# Patient Record
Sex: Male | Born: 1975 | Race: Black or African American | Hispanic: No | Marital: Single | Smoking: Never smoker
Health system: Southern US, Community
[De-identification: ages and names within clinical notes are randomized; demographics above are authoritative.]

## PROBLEM LIST (undated history)

## (undated) DIAGNOSIS — E66813 Obesity, class 3: Secondary | ICD-10-CM

## (undated) DIAGNOSIS — Z91148 Patient's other noncompliance with medication regimen for other reason: Secondary | ICD-10-CM

## (undated) DIAGNOSIS — N179 Acute kidney failure, unspecified: Secondary | ICD-10-CM

## (undated) DIAGNOSIS — J45909 Unspecified asthma, uncomplicated: Secondary | ICD-10-CM

## (undated) DIAGNOSIS — K439 Ventral hernia without obstruction or gangrene: Secondary | ICD-10-CM

## (undated) DIAGNOSIS — M109 Gout, unspecified: Secondary | ICD-10-CM

## (undated) DIAGNOSIS — I10 Essential (primary) hypertension: Secondary | ICD-10-CM

## (undated) DIAGNOSIS — I219 Acute myocardial infarction, unspecified: Secondary | ICD-10-CM

## (undated) DIAGNOSIS — I428 Other cardiomyopathies: Secondary | ICD-10-CM

## (undated) DIAGNOSIS — I5042 Chronic combined systolic (congestive) and diastolic (congestive) heart failure: Secondary | ICD-10-CM

## (undated) DIAGNOSIS — E876 Hypokalemia: Secondary | ICD-10-CM

## (undated) DIAGNOSIS — I119 Hypertensive heart disease without heart failure: Secondary | ICD-10-CM

## (undated) DIAGNOSIS — Z9114 Patient's other noncompliance with medication regimen: Secondary | ICD-10-CM

## (undated) DIAGNOSIS — G4730 Sleep apnea, unspecified: Secondary | ICD-10-CM

## (undated) HISTORY — DX: Essential (primary) hypertension: I10

## (undated) HISTORY — DX: Unspecified asthma, uncomplicated: J45.909

---

## 2002-03-18 ENCOUNTER — Emergency Department (HOSPITAL_COMMUNITY): Admission: EM | Admit: 2002-03-18 | Discharge: 2002-03-18 | Payer: Self-pay | Admitting: Emergency Medicine

## 2003-06-10 ENCOUNTER — Emergency Department (HOSPITAL_COMMUNITY): Admission: EM | Admit: 2003-06-10 | Discharge: 2003-06-10 | Payer: Self-pay | Admitting: Emergency Medicine

## 2006-09-02 ENCOUNTER — Ambulatory Visit (HOSPITAL_BASED_OUTPATIENT_CLINIC_OR_DEPARTMENT_OTHER): Admission: RE | Admit: 2006-09-02 | Discharge: 2006-09-02 | Payer: Self-pay | Admitting: Internal Medicine

## 2006-09-17 ENCOUNTER — Ambulatory Visit: Payer: Self-pay | Admitting: Internal Medicine

## 2006-11-04 ENCOUNTER — Ambulatory Visit (HOSPITAL_BASED_OUTPATIENT_CLINIC_OR_DEPARTMENT_OTHER): Admission: RE | Admit: 2006-11-04 | Discharge: 2006-11-04 | Payer: Self-pay | Admitting: Internal Medicine

## 2006-11-12 ENCOUNTER — Ambulatory Visit: Payer: Self-pay | Admitting: Internal Medicine

## 2009-11-26 ENCOUNTER — Ambulatory Visit: Payer: Self-pay | Admitting: Internal Medicine

## 2009-11-26 ENCOUNTER — Encounter: Payer: Self-pay | Admitting: Internal Medicine

## 2009-11-26 DIAGNOSIS — I130 Hypertensive heart and chronic kidney disease with heart failure and stage 1 through stage 4 chronic kidney disease, or unspecified chronic kidney disease: Secondary | ICD-10-CM | POA: Insufficient documentation

## 2009-11-26 DIAGNOSIS — K0510 Chronic gingivitis, plaque induced: Secondary | ICD-10-CM | POA: Insufficient documentation

## 2009-11-26 DIAGNOSIS — I119 Hypertensive heart disease without heart failure: Secondary | ICD-10-CM

## 2009-11-26 DIAGNOSIS — B079 Viral wart, unspecified: Secondary | ICD-10-CM | POA: Insufficient documentation

## 2009-11-26 LAB — CONVERTED CEMR LAB
BUN: 17 mg/dL (ref 6–23)
Basophils Absolute: 0.1 10*3/uL (ref 0.0–0.1)
Bilirubin, Direct: 0.2 mg/dL (ref 0.0–0.3)
CO2: 31 meq/L (ref 19–32)
Calcium: 9.5 mg/dL (ref 8.4–10.5)
Chlamydia, Swab/Urine, PCR: NEGATIVE
Chloride: 101 meq/L (ref 96–112)
Cholesterol: 134 mg/dL (ref 0–200)
Creatinine, Ser: 1.4 mg/dL (ref 0.4–1.5)
Eosinophils Absolute: 0.1 10*3/uL (ref 0.0–0.7)
HDL: 36.2 mg/dL — ABNORMAL LOW (ref >39.00)
LDL Cholesterol: 85 mg/dL (ref 0–99)
Lymphocytes Relative: 26.4 % (ref 12.0–46.0)
MCHC: 33.3 g/dL (ref 30.0–36.0)
MCV: 85.6 fL (ref 78.0–100.0)
Monocytes Absolute: 0.5 10*3/uL (ref 0.1–1.0)
Neutrophils Relative %: 61.3 % (ref 43.0–77.0)
Nitrite: NEGATIVE
Platelets: 322 10*3/uL (ref 150.0–400.0)
Specific Gravity, Urine: 1.015 (ref 1.000–1.030)
Total Bilirubin: 0.8 mg/dL (ref 0.3–1.2)
Total Protein: 7.6 g/dL (ref 6.0–8.3)
Triglycerides: 62 mg/dL (ref 0.0–149.0)
pH: 7 (ref 5.0–8.0)

## 2009-11-27 ENCOUNTER — Encounter: Payer: Self-pay | Admitting: Internal Medicine

## 2009-12-30 ENCOUNTER — Emergency Department (HOSPITAL_COMMUNITY)
Admission: EM | Admit: 2009-12-30 | Discharge: 2009-12-30 | Payer: Self-pay | Source: Home / Self Care | Admitting: Emergency Medicine

## 2010-02-10 NOTE — Letter (Signed)
Summary: Results Follow-up Letter  [facility]  [address] [place], [place] [address]   Phone: [phone]  Fax: [phone]    11/27/2009  [address] [place], [place]  [address]  Dear Mr. STRATMANN,   The following are the results of your recent test(s):  Test     Result     Kidney     normal STD tests     negative CBC       normal Urine       trace of blood Liver       one, very slight enzyme elevation  _________________________________________________________  Please call for an appointment soon _________________________________________________________ _________________________________________________________ _________________________________________________________  Sincerely,  Sanda Linger MD [facility]

## 2010-02-10 NOTE — Assessment & Plan Note (Signed)
Summary: NEW [employer] PT--PKG---#---STC   Vital Signs:  Patient profile:   35 year old male Height:      66 inches Weight:      268 pounds BMI:     43.41 O2 Sat:      98 % on Room air Temp:     98.3 degrees F oral Pulse rate:   82 / minute Pulse rhythm:   regular Resp:     16 per minute BP sitting:   158 / 100  (left arm) Cuff size:   large  Vitals Entered By: Rock Nephew CMA (November 26, 2009 10:12 AM)  Nutrition Counseling: Patient's BMI is greater than 25 and therefore counseled on weight management options.  O2 Flow:  Room air CC: Pt here for CPX w/ labs, Hypertension Management Is Patient Diabetic? No Pain Assessment Patient in pain? no       Does patient need assistance? Functional Status Self care Ambulation Normal   Primary Care Provider:  Etta Grandchild MD  CC:  Pt here for CPX w/ labs and Hypertension Management.  History of Present Illness: New to me for a complete physical. He wants a complete STD screen. He has a hx. of htn. but stopped taking meds a few years ago.  Hypertension History:      He denies headache, chest pain, palpitations, dyspnea with exertion, orthopnea, PND, peripheral edema, visual symptoms, neurologic problems, syncope, and side effects from treatment.  He notes no problems with any antihypertensive medication side effects.        Positive major cardiovascular risk factors include hypertension.  Negative major cardiovascular risk factors include male age less than 59 years old, no history of diabetes or hyperlipidemia, negative family history for ischemic heart disease, and non-tobacco-user status.        Further assessment for target organ damage reveals no history of ASHD, cardiac end-organ damage (CHF/LVH), stroke/TIA, peripheral vascular disease, renal insufficiency, or hypertensive retinopathy.     Preventive Screening-Counseling & Management  Alcohol-Tobacco     Alcohol drinks/day: 0     Alcohol Counseling: not indicated;  patient does not drink     Smoking Status: never     Tobacco Counseling: not indicated; no tobacco use  Caffeine-Diet-Exercise     Does Patient Exercise: no  Hep-HIV-STD-Contraception     Hepatitis Risk: no risk noted     HIV Risk: no risk noted     STD Risk: risk noted     STD Risk Counseling: to avoid increased STD risk     Dental Visit-last 6 months no     Dental Care Counseling: to seek dental care; no dental care within six months     TSE monthly: yes     Testicular SE Education/Counseling to perform regular STE     Sun Exposure-Excessive: no     Sun Exposure Counseling: not indicated; sun exposure is acceptable  Safety-Violence-Falls     Seat Belt Use: yes     Helmet Use: n/a     Firearms in the Home: no firearms in the home     Smoke Detectors: yes     Violence in the Home: no risk noted     Sexual Abuse: no      Sexual History:  currently monogamous.        Drug Use:  no.        Blood Transfusions:  no.    Medications Prior to Update: 1)  None  Current Medications (verified): 1)  Valturna 300-320 Mg Tabs (Aliskiren-Valsartan) .... One By Mouth Once Daily For High Blood Pressure  Allergies (verified): No Known Drug Allergies  Past History:  Past Medical History: Hypertension  Past Surgical History: Denies surgical history  Family History: Family History Hypertension Mother has HIV  Social History: Occupation: dock Financial controller Married Never Smoked Alcohol use-no Drug use-no Regular exercise-no Smoking Status:  never Hepatitis Risk:  no risk noted HIV Risk:  no risk noted STD Risk:  risk noted Dental Care w/in 6 mos.:  no Sun Exposure-Excessive:  no Risk analyst Use:  yes Sexual History:  currently monogamous Blood Transfusions:  no Drug Use:  no Does Patient Exercise:  no  Review of Systems       The patient complains of weight gain and suspicious skin lesions.  The patient denies anorexia, fever, weight loss, chest pain, syncope, dyspnea on  exertion, peripheral edema, prolonged cough, headaches, hemoptysis, abdominal pain, melena, hematochezia, severe indigestion/heartburn, hematuria, incontinence, genital sores, muscle weakness, depression, abnormal bleeding, enlarged lymph nodes, angioedema, and testicular masses.   CV:  Denies chest pain or discomfort, difficulty breathing at night, difficulty breathing while lying down, fainting, fatigue, leg cramps with exertion, lightheadness, near fainting, palpitations, shortness of breath with exertion, and swelling of feet. Endo:  Denies cold intolerance, excessive hunger, excessive thirst, excessive urination, heat intolerance, polyuria, and weight change.  Physical Exam  General:  alert, well-developed, well-nourished, well-hydrated, appropriate dress, normal appearance, healthy-appearing, cooperative to examination, and overweight-appearing.   Head:  normocephalic, atraumatic, no abnormalities observed, and no abnormalities palpated.   Eyes:  vision grossly intact, pupils equal, pupils round, pupils reactive to light, and no injection.   Nose:  External nasal examination shows no deformity or inflammation. Nasal mucosa are pink and moist without lesions or exudates. Mouth:  pharynx pink and moist, no erythema, no exudates, no posterior lymphoid hypertrophy, no postnasal drip, no pharyngeal crowing, poor dentition, excessive plaque, gingival inflammation, and gingival recession.   Neck:  supple, full ROM, no masses, no thyromegaly, no thyroid nodules or tenderness, no JVD, no HJR, normal carotid upstroke, and no carotid bruits.   Chest Wall:  No deformities, masses, tenderness or gynecomastia noted. Breasts:  No masses or gynecomastia noted Lungs:  Normal respiratory effort, chest expands symmetrically. Lungs are clear to auscultation, no crackles or wheezes. Heart:  Normal rate and regular rhythm. S1 and S2 normal without gallop, murmur, click, rub or other extra sounds. Abdomen:  soft,  non-tender, normal bowel sounds, no distention, no masses, no guarding, no rigidity, no rebound tenderness, no abdominal hernia, no inguinal hernia, no hepatomegaly, and no splenomegaly.   Rectal:  No external abnormalities noted. Normal sphincter tone. No rectal masses or tenderness. Genitalia:  uncircumcised, no hydrocele, no varicocele, no scrotal masses, no testicular masses or atrophy, no cutaneous lesions, and no urethral discharge.   Prostate:  Prostate gland firm and smooth, no enlargement, nodularity, tenderness, mass, asymmetry or induration. Msk:  normal ROM, no joint tenderness, no joint swelling, no joint warmth, no redness over joints, no joint deformities, no joint instability, no crepitation, and no muscle atrophy.   Pulses:  R and L carotid,radial,femoral,dorsalis pedis and posterior tibial pulses are full and equal bilaterally Extremities:  No clubbing, cyanosis, edema, or deformity noted with normal full range of motion of all joints.   Neurologic:  No cranial nerve deficits noted. Station and gait are normal. Plantar reflexes are down-going bilaterally. DTRs are symmetrical throughout. Sensory, motor and coordinative functions appear intact. Skin:  very large verrucous lesion on left lower posterior leg. Cervical Nodes:  no anterior cervical adenopathy and no posterior cervical adenopathy.   Axillary Nodes:  no R axillary adenopathy and no L axillary adenopathy.   Inguinal Nodes:  no R inguinal adenopathy and no L inguinal adenopathy.   Psych:  Cognition and judgment appear intact. Alert and cooperative with normal attention span and concentration. No apparent delusions, illusions, hallucinations   Impression & Recommendations:  Problem # 1:  CHRONIC GINGIVITIS PLAQUE INDUCED (ICD-523.10) Assessment New  Orders: Dental Referral (Dentist)  Problem # 2:  WART, VIRAL (ICD-078.10) Assessment: New  Orders: Dermatology Referral (Derma)  Problem # 3:  HYPERTENSION  (ICD-401.9) Assessment: Deteriorated  His updated medication list for this problem includes:    Valturna 300-320 Mg Tabs (Aliskiren-valsartan) ..... One by mouth once daily for high blood pressure  Orders: Venipuncture (62130) TLB-Lipid Panel (80061-LIPID) TLB-BMP (Basic Metabolic Panel-BMET) (80048-METABOL) TLB-CBC Platelet - w/Differential (85025-CBCD) TLB-Hepatic/Liver Function Pnl (80076-HEPATIC) TLB-TSH (Thyroid Stimulating Hormone) (84443-TSH) TLB-Udip w/ Micro (81001-URINE) T-Chlamydia  Probe, urine (86578-46962) T-GC Probe, urine (95284-13244) T-HIV Antibody  (Reflex) [phone]) T-RPR (Syphilis) (44034-74259) EKG w/ Interpretation (93000)  BP today: 158/100  Problem # 4:  ROUTINE GENERAL MEDICAL EXAM@HEALTH  CARE FACL (ICD-V70.0) Assessment: New  Orders: Venipuncture (56387) TLB-Lipid Panel (80061-LIPID) TLB-BMP (Basic Metabolic Panel-BMET) (80048-METABOL) TLB-CBC Platelet - w/Differential (85025-CBCD) TLB-Hepatic/Liver Function Pnl (80076-HEPATIC) TLB-TSH (Thyroid Stimulating Hormone) (84443-TSH) TLB-Udip w/ Micro (81001-URINE) T-Chlamydia  Probe, urine (56433-29518) T-GC Probe, urine (84166-06301) T-HIV Antibody  (Reflex) [id number]) T-RPR (Syphilis) (73220-25427)  Td Booster: Tdap (11/26/2009)   Flu Vax: Fluvax 3+ (11/26/2009)    Discussed using sunscreen, use of alcohol, drug use, self testicular exam, routine dental care, routine eye care, routine physical exam, seat belts, multiple vitamins,  and recommendations for immunizations.  Discussed exercise and checking cholesterol.  Discussed gun safety, safe sex, and contraception.   Problem # 5:  OBESITY, UNSPECIFIED (ICD-278.00) Assessment: New  Orders: Venipuncture (06237) TLB-Lipid Panel (80061-LIPID) TLB-BMP (Basic Metabolic Panel-BMET) (80048-METABOL) TLB-CBC Platelet - w/Differential (85025-CBCD) TLB-Hepatic/Liver Function Pnl (80076-HEPATIC) TLB-TSH (Thyroid Stimulating Hormone)  (84443-TSH) TLB-Udip w/ Micro (81001-URINE) T-Chlamydia  Probe, urine (62831-51761) T-GC Probe, urine (60737-10626) T-HIV Antibody  (Reflex) [id number]) T-RPR (Syphilis) (50093-81829)  Ht: 66 (11/26/2009)   Wt: 268 (11/26/2009)   BMI: 43.41 (11/26/2009)  Complete Medication List: 1)  Valturna 300-320 Mg Tabs (Aliskiren-valsartan) .... One by mouth once daily for high blood pressure  Other Orders: Admin 1st Vaccine (93716) Flu Vaccine 40yrs + (96789) Tdap => 39yrs IM (38101) Admin of Any Addtl Vaccine (75102)  Hypertension Assessment/Plan:      The patient's hypertensive risk group is category A: No risk factors and no target organ damage.  Today's blood pressure is 158/100.  His blood pressure goal is < 140/90.  Patient Instructions: 1)  Please schedule a follow-up appointment in 2 months. 2)  It is important that you exercise regularly at least 20 minutes 5 times a week. If you develop chest pain, have severe difficulty breathing, or feel very tired , stop exercising immediately and seek medical attention. 3)  You need to lose weight. Consider a lower calorie diet and regular exercise.  4)  If you could be exposed to sexually transmitted diseases, you should use a condom. 5)  Check your Blood Pressure regularly. If it is above 140/90: you should make an appointment. Prescriptions: VALTURNA 300-320 MG TABS (ALISKIREN-VALSARTAN) One by mouth once daily for high blood pressure  #56 x 0   Entered and Authorized by:  Etta Grandchild MD   Signed by:   Etta Grandchild MD on 11/26/2009   Method used:   Samples Given   RxID:   [id number]    Orders Added: 1)  Venipuncture [36415] 2)  TLB-Lipid Panel [80061-LIPID] 3)  TLB-BMP (Basic Metabolic Panel-BMET) [80048-METABOL] 4)  TLB-CBC Platelet - w/Differential [85025-CBCD] 5)  TLB-Hepatic/Liver Function Pnl [80076-HEPATIC] 6)  TLB-TSH (Thyroid Stimulating Hormone) [84443-TSH] 7)  TLB-Udip w/ Micro [81001-URINE] 8)  T-Chlamydia   Probe, urine [phone] 9)  T-GC Probe, urine [phone] 10)  T-HIV Antibody  (Reflex) [[id number]] 11)  T-RPR (Syphilis) [44010-27253] 12)  Dermatology Referral [Derma] 13)  Dental Referral [Dentist] 14)  Admin 1st Vaccine [90471] 15)  Flu Vaccine 39yrs + [90658] 16)  Tdap => 27yrs IM [90715] 17)  Admin of Any Addtl Vaccine [90472] 18)  EKG w/ Interpretation [93000] 19)  New Patient Level IV [99204] 20)  New Patient 18-39 years [99385]   Immunizations Administered:  Tetanus Vaccine:    Vaccine Type: Tdap    Site: right deltoid    Mfr: GlaxoSmithKline    Dose: 0.5 ml    Route: IM    Given by: Rock Nephew CMA    Exp. Date: 10/31/2011    Lot #: [id number]    VIS given: 11/29/07 version given November 26, 2009.   Immunizations Administered:  Tetanus Vaccine:    Vaccine Type: Tdap    Site: right deltoid    Mfr: GlaxoSmithKline    Dose: 0.5 ml    Route: IM    Given by: Rock Nephew CMA    Exp. Date: 10/31/2011    Lot #: [id number]    VIS given: 11/29/07 version given November 26, 2009.  Prevention & Chronic Care Immunizations   Influenza vaccine: Fluvax 3+  (11/26/2009)   Influenza vaccine deferral: Refused  (11/26/2009)    Tetanus booster: 11/26/2009: Tdap    Pneumococcal vaccine: Not documented  Other Screening   Smoking status: never  (11/26/2009)  Hypertension   Last Blood Pressure: 158 / 100  (11/26/2009)   Serum creatinine: Not documented   Serum potassium Not documented  Self-Management Support :    Hypertension self-management support: Not documented   Nursing Instructions: Give tetanus booster today  Flu Vaccine Consent Questions     Do you have a history of severe allergic reactions to this vaccine? no    Any prior history of allergic reactions to egg and/or gelatin? no    Do you have a sensitivity to the preservative Thimersol? no    Do you have a past history of Guillan-Barre Syndrome? no    Do you currently have an acute  febrile illness? no    Have you ever had a severe reaction to latex? no    Vaccine information given and explained to patient? yes    Are you currently pregnant? no    Lot Number:[id number]   Exp Date:07/11/2010   Site Given  Left Deltoid IM Smoking status: Not documented  Hypertension   Last Blood Pressure: 158 / 100  (11/26/2009)   Serum creatinine: Not documented   Serum potassium Not documented  Self-Management Support :    Hypertension self-management support: Not documented   Nursing Instructions: Give tetanus booster today   .lbflu

## 2010-02-10 NOTE — Letter (Signed)
Summary: Lipid Letter  [facility]  [address] [place], [place] [address]   Phone: [phone]  Fax: [phone]    11/27/2009  Piper Albro [address] [place], [place]  [address]  Dear Cristal Deer:  We have carefully reviewed your last lipid profile from 11/26/2009 and the results are noted below with a summary of recommendations for lipid management.    Cholesterol:       134     Goal: <200   HDL "good" Cholesterol:   57.84     Goal: >40   LDL "bad" Cholesterol:   85     Goal: <130   Triglycerides:       62.0     Goal: <150        TLC Diet (Therapeutic Lifestyle Change): Saturated Fats & Transfatty acids should be kept < 7% of total calories ***Reduce Saturated Fats Polyunstaurated Fat can be up to 10% of total calories Monounsaturated Fat Fat can be up to 20% of total calories Total Fat should be no greater than 25-35% of total calories Carbohydrates should be 50-60% of total calories Protein should be approximately 15% of total calories Fiber should be at least 20-30 grams a day ***Increased fiber may help lower LDL Total Cholesterol should be < 200mg /day Consider adding plant stanol/sterols to diet (example: Benacol spread) ***A higher intake of unsaturated fat may reduce Triglycerides and Increase HDL    Adjunctive Measures (may lower LIPIDS and reduce risk of Heart Attack) include: Aerobic Exercise (20-30 minutes 3-4 times a week) Limit Alcohol Consumption Weight Reduction Aspirin 75-81 mg a day by mouth (if not allergic or contraindicated) Dietary Fiber 20-30 grams a day by mouth     Current Medications: 1)    Valturna 300-320 Mg Tabs (Aliskiren-valsartan) .... One by mouth once daily for high blood pressure  If you have any questions, please call. We appreciate being able to work with you.   Sincerely,    [facility] Etta Grandchild MD

## 2010-05-26 NOTE — Procedures (Signed)
NAME:  Noah Rodriguez Rodriguez, Noah Rodriguez        ACCOUNT NO.:  [id number]   MEDICAL RECORD NO.:  [id number]          PATIENT TYPE:  OUT   LOCATION:  SLEEP CENTER                 FACILITY:  [facility]   PHYSICIAN:  Clinton D. Maple Hudson, MD, FCCP, FACPDATE OF BIRTH:  02-27-75   DATE OF STUDY:  09/02/2006                            NOCTURNAL POLYSOMNOGRAM   REFERRING PHYSICIAN:   I had originally dictated this on September 11, 2006, but it got  interrupted and that first attempt will be discarded.  This is a re-  dictation.   INDICATION FOR STUDY:  Hypersomnia with sleep apnea.   EPWORTH SLEEPINESS SCORE:  13/24, BMI 43.8, weight 280 pounds.   HOME MEDICATIONS:  Benicar/HCT.   A diagnostic NPSG protocol was requested.   SLEEP ARCHITECTURE:  Total sleep time 228 minutes with sleep efficiency  63%.  Stage 1 was 9%, stage 2 71%, stage 3 10%, REM 10% of total sleep  time.  Sleep latency 10 minutes, REM latency 78 minutes, awake after  sleep onset 86 minutes, arousal index markedly increased at 59.5  indicating increased EEG arousal.  No bedtime medication was reported.   RESPIRATORY DATA:  Diagnostic NPSG protocol requested.  Apnea/hypopnea  index (AHI, RDI) 79.2 obstructive events per hour indicating severe  obstructive sleep apnea/hypopnea syndrome.  All events were hypopneas  totaling 301.  Events were significantly more common while supine (AHI  93.3), but also significant while lying on left side (AHI 40).  REM AHI  85.3.   OXYGEN DATA:  Very loud snoring with oxygen desaturation nadir 82%.  Mean oxygen saturation through the study was 93% on room air.   CARDIAC DATA:  Normal sinus rhythm.   MOVEMENT/PARASOMNIA:  No significant leg jerk disturbance.  Bathroom x2.   IMPRESSION/RECOMMENDATION:  1. Severe obstructive sleep apnea/hypopnea syndrome, apnea/hypopnea      index (AHI) 79.2 per hour.  All events were hypopneas.  Events were      more common while supine, but not limited to supine position.   Very      loud snoring with oxygen desaturation nadir 82%.  2. Consider return for continuous positive airway pressure (CPAP)      titration or evaluate for alternative therapy as appropriate.      Clinton D. Maple Hudson, MD, [facility], FACP  Diplomate, Biomedical engineer of Sleep Medicine  Electronically Signed     CDY/MEDQ  D:  09/17/2006 16:47:32  T:  09/17/2006 17:56:26  Job:  [id number]

## 2010-05-26 NOTE — Procedures (Signed)
NAME:  Noah Rodriguez, Noah Rodriguez        ACCOUNT NO.:  [id number]   MEDICAL RECORD NO.:  [id number]          PATIENT TYPE:  OUT   LOCATION:  SLEEP CENTER                 FACILITY:  [facility]   PHYSICIAN:  Clinton D. Maple Hudson, MD, FCCP, FACPDATE OF BIRTH:  04/10/1975   DATE OF STUDY:  09/02/2006                            NOCTURNAL POLYSOMNOGRAM   REFERRING PHYSICIAN:  Fleet Contras, M.D.   INDICATION FOR STUDY:  Hypersomnia with sleep apnea.   EPWORTH SLEEPINESS SCORE:  13/24, BMI 43.8, weight 280 pounds.   MEDICATIONS:  Benicar HCT.   A diagnostic NPSG protocol was requested.   SLEEP ARCHITECTURE:  Total sleep time 228 minutes with sleep efficiency  63%.  Stage I was 9%, stage II 71%, stage III 10%, REM 10% of total  sleep time.  Sleep latency 10 minutes, REM latency 78 minutes, awake  after sleep onset 86 minutes. Arousal index increased at 59.5 indicating  increased EEG arousal. No bedtime medication was reported.   RESPIRATORY DATA:  Apnea/hypopnea index (AHI, RDI) 79.2 obstructive  events per hour indicating severe obstructive sleep apnea/hypopnea  syndrome. There were a total of 301 events, all hypopnea's.  Events were  somewhat more common while supine but also significant while lying on  left side. REM AHI 85.3.   OXYGEN DATA:  Very loud snoring with oxygen desaturation nadir 82%.  Mean  (dictation ended here)   CARDIAC DATA:  __________   MOVEMENT-PARASOMNIA:  __________   IMPRESSIONS-RECOMMENDATIONS:  __________      Rennis Chris. Maple Hudson, MD, [facility], FACP  Diplomate, American Board of Sleep Medicine     CDY/MEDQ  D:  09/11/2006 11:43:42  T:  09/11/2006 16:40:00  Job:  [id number]

## 2010-05-26 NOTE — Procedures (Signed)
NAME:  THORN, DEMAS        ACCOUNT NO.:  [id number]   MEDICAL RECORD NO.:  [id number]          PATIENT TYPE:  OUT   LOCATION:  SLEEP CENTER                 FACILITY:  [facility]   PHYSICIAN:  Clinton D. Maple Hudson, MD, FCCP, FACPDATE OF BIRTH:  [birth year] 1975   DATE OF STUDY:  11/04/2006                            NOCTURNAL POLYSOMNOGRAM   REFERRING PHYSICIAN:   INDICATION FOR STUDY:  Hypersomnia with sleep apnea.   EPWORTH SLEEPINESS SCORE:  14/24, height 5 feet 7 inches, neck size 17  inches, weight 280 pounds.   HOME MEDICATIONS:  Listed as Benicar/HCT.   A baseline diagnostic NPSG on September 02, 2006, recorded an AHI of 79.2  per hour.  CPAP titration is requested.   SLEEP ARCHITECTURE:  Total sleep time 361 minutes with sleep efficiency  92.8%.  Stage 1 was 5%, stage 2 85%, stage 3 3%, REM 5.8% of total sleep  time.  Awake after sleep onset 23 minutes.   RESPIRATORY DATA:  CPAP titration protocol.  CPAP was titrated to 14  CWP, AHI 0 per hour.  A medium Mirage Quattro mask was used with heated  humidifier.   OXYGEN DATA:  Snoring was prevented by CPAP with mean oxygen saturation  on CPAP at 94% on room air.   CARDIAC DATA:  Normal sinus rhythm.   MOVEMENT/PARASOMNIA:  No significant movement disturbance.   IMPRESSION/RECOMMENDATION:  1. Successful continuous positive airway pressure (CPAP) titration to      14 centimeters of water, apnea/hypopnea index 0 per hour.  A medium      Mirage Quattro mask was used with heated humidifier.  2. Baseline diagnostic polysomnogram on September 02, 2006, recorded an      apnea/hypopnea index of 79.2 per hour.      Clinton D. Maple Hudson, MD, [facility], FACP  Diplomate, Biomedical engineer of Sleep Medicine  Electronically Signed     CDY/MEDQ  D:  11/12/2006 15:25:14  T:  11/13/2006 20:02:45  Job:  [id number]

## 2011-12-31 ENCOUNTER — Ambulatory Visit (INDEPENDENT_AMBULATORY_CARE_PROVIDER_SITE_OTHER): Payer: 59 | Admitting: Internal Medicine

## 2011-12-31 ENCOUNTER — Encounter: Payer: Self-pay | Admitting: Internal Medicine

## 2011-12-31 ENCOUNTER — Ambulatory Visit (INDEPENDENT_AMBULATORY_CARE_PROVIDER_SITE_OTHER)
Admission: RE | Admit: 2011-12-31 | Discharge: 2011-12-31 | Disposition: A | Discharge status: Home / Self Care | Payer: 59 | Source: Ambulatory Visit | Attending: Internal Medicine | Admitting: Internal Medicine

## 2011-12-31 ENCOUNTER — Other Ambulatory Visit (INDEPENDENT_AMBULATORY_CARE_PROVIDER_SITE_OTHER): Payer: 59

## 2011-12-31 VITALS — BP 148/100 | HR 91 | Temp 97.2°F | Resp 16 | Ht 66.0 in | Wt 279.0 lb

## 2011-12-31 DIAGNOSIS — R05 Cough: Secondary | ICD-10-CM

## 2011-12-31 DIAGNOSIS — I517 Cardiomegaly: Secondary | ICD-10-CM

## 2011-12-31 DIAGNOSIS — I10 Essential (primary) hypertension: Secondary | ICD-10-CM

## 2011-12-31 DIAGNOSIS — R059 Cough, unspecified: Secondary | ICD-10-CM

## 2011-12-31 DIAGNOSIS — E669 Obesity, unspecified: Secondary | ICD-10-CM

## 2011-12-31 DIAGNOSIS — J4530 Mild persistent asthma, uncomplicated: Secondary | ICD-10-CM | POA: Insufficient documentation

## 2011-12-31 DIAGNOSIS — J45901 Unspecified asthma with (acute) exacerbation: Secondary | ICD-10-CM

## 2011-12-31 LAB — CBC WITH DIFFERENTIAL/PLATELET
Eosinophils Relative: 3.2 % (ref 0.0–5.0)
HCT: 43.7 % (ref 39.0–52.0)
Hemoglobin: 14.6 g/dL (ref 13.0–17.0)
Lymphs Abs: 1.4 10*3/uL (ref 0.7–4.0)
Monocytes Relative: 13.4 % — ABNORMAL HIGH (ref 3.0–12.0)
Neutro Abs: 2.3 10*3/uL (ref 1.4–7.7)
RBC: 5.23 Mil/uL (ref 4.22–5.81)
WBC: 4.4 10*3/uL — ABNORMAL LOW (ref 4.5–10.5)

## 2011-12-31 LAB — COMPREHENSIVE METABOLIC PANEL
CO2: 29 mEq/L (ref 19–32)
Creatinine, Ser: 1.4 mg/dL (ref 0.4–1.5)
GFR: 74 mL/min (ref >60.00)
Glucose, Bld: 99 mg/dL (ref 70–99)
Total Bilirubin: 0.7 mg/dL (ref 0.3–1.2)

## 2011-12-31 LAB — URINALYSIS, ROUTINE W REFLEX MICROSCOPIC
Bilirubin Urine: NEGATIVE
Nitrite: NEGATIVE

## 2011-12-31 LAB — HEMOGLOBIN A1C: Hgb A1c MFr Bld: 6.3 % (ref 4.6–6.5)

## 2011-12-31 LAB — LIPID PANEL
Cholesterol: 120 mg/dL (ref 0–200)
HDL: 30.3 mg/dL — ABNORMAL LOW (ref >39.00)
VLDL: 14.2 mg/dL (ref 0.0–40.0)

## 2011-12-31 MED ORDER — OLMESARTAN MEDOXOMIL-HCTZ 40-25 MG PO TABS: 1.0000 | ORAL_TABLET | Freq: Every day | ORAL | Status: DC

## 2011-12-31 MED ORDER — METHYLPREDNISOLONE ACETATE 80 MG/ML IJ SUSP
120.0000 mg | Freq: Once | INTRAMUSCULAR | Status: AC
  Administered 2011-12-31: 120 mg via INTRAMUSCULAR

## 2011-12-31 MED ORDER — MOMETASONE FURO-FORMOTEROL FUM 100-5 MCG/ACT IN AERO: 2.0000 | INHALATION_SPRAY | Freq: Two times a day (BID) | RESPIRATORY_TRACT | Status: DC

## 2011-12-31 NOTE — Patient Instructions (Signed)
Asthma, Adult Asthma is caused by narrowing of the air passages in the lungs. It may be triggered by pollen, dust, animal dander, molds, some foods, respiratory infections, exposure to smoke, exercise, emotional stress or other allergens (things that cause allergic reactions or allergies). Repeat attacks are common. HOME CARE INSTRUCTIONS   Use prescription medications as ordered by your caregiver.  Avoid pollen, dust, animal dander, molds, smoke and other things that cause attacks at home and at work.  You may have fewer attacks if you decrease dust in your home. Electrostatic air cleaners may help.  It may help to replace your pillows or mattress with materials less likely to cause allergies.  Talk to your caregiver about an action plan for managing asthma attacks at home, including, the use of a peak flow meter which measures the severity of your asthma attack. An action plan can help minimize or stop the attack without having to seek medical care.  If you are not on a fluid restriction, drink 8 to 10 glasses of water each day.  Always have a plan prepared for seeking medical attention, including, calling your physician, accessing local emergency care, and calling 911 (in the [place] for a severe attack.  Discuss possible exercise routines with your caregiver.  If animal dander is the cause of asthma, you may need to get rid of pets. SEEK MEDICAL CARE IF:   You have wheezing and shortness of breath even if taking medicine to prevent attacks.  You have muscle aches, chest pain or thickening of sputum.  Your sputum changes from clear or white to yellow, green, gray, or bloody.  You have any problems that may be related to the medicine you are taking (such as a rash, itching, swelling or trouble breathing). SEEK IMMEDIATE MEDICAL CARE IF:   Your usual medicines do not stop your wheezing or there is increased coughing and/or shortness of breath.  You have increased difficulty  breathing.  You have a fever. MAKE SURE YOU:   Understand these instructions.  Will watch your condition.  Will get help right away if you are not doing well or get worse. Document Released: 12/28/2004 Document Revised: 03/22/2011 Document Reviewed: 08/16/2007 [facility] Patient Information 2013 [place], [place]. Hypertension As your heart beats, it forces blood through your arteries. This force is your blood pressure. If the pressure is too high, it is called hypertension (HTN) or high blood pressure. HTN is dangerous because you may have it and not know it. High blood pressure may mean that your heart has to work harder to pump blood. Your arteries may be narrow or stiff. The extra work puts you at risk for heart disease, stroke, and other problems.  Blood pressure consists of two numbers, a higher number over a lower, 110/72, for example. It is stated as "110 over 72." The ideal is below 120 for the top number (systolic) and under 80 for the bottom (diastolic). Write down your blood pressure today. You should pay close attention to your blood pressure if you have certain conditions such as:  Heart failure.  Prior heart attack.  Diabetes  Chronic kidney disease.  Prior stroke.  Multiple risk factors for heart disease. To see if you have HTN, your blood pressure should be measured while you are seated with your arm held at the level of the heart. It should be measured at least twice. A one-time elevated blood pressure reading (especially in the Emergency Department) does not mean that you need treatment. There may be  conditions in which the blood pressure is different between your right and left arms. It is important to see your caregiver soon for a recheck. Most people have essential hypertension which means that there is not a specific cause. This type of high blood pressure may be lowered by changing lifestyle factors such as:  Stress.  Smoking.  Lack of exercise.  Excessive  weight.  Drug/tobacco/alcohol use.  Eating less salt. Most people do not have symptoms from high blood pressure until it has caused damage to the body. Effective treatment can often prevent, delay or reduce that damage. TREATMENT  When a cause has been identified, treatment for high blood pressure is directed at the cause. There are a large number of medications to treat HTN. These fall into several categories, and your caregiver will help you select the medicines that are best for you. Medications may have side effects. You should review side effects with your caregiver. If your blood pressure stays high after you have made lifestyle changes or started on medicines,   Your medication(s) may need to be changed.  Other problems may need to be addressed.  Be certain you understand your prescriptions, and know how and when to take your medicine.  Be sure to follow up with your caregiver within the time frame advised (usually within two weeks) to have your blood pressure rechecked and to review your medications.  If you are taking more than one medicine to lower your blood pressure, make sure you know how and at what times they should be taken. Taking two medicines at the same time can result in blood pressure that is too low. SEEK IMMEDIATE MEDICAL CARE IF:  You develop a severe headache, blurred or changing vision, or confusion.  You have unusual weakness or numbness, or a faint feeling.  You have severe chest or abdominal pain, vomiting, or breathing problems. MAKE SURE YOU:   Understand these instructions.  Will watch your condition.  Will get help right away if you are not doing well or get worse. Document Released: 12/28/2004 Document Revised: 03/22/2011 Document Reviewed: 08/18/2007 [facility] Patient Information 2013 [place], [place].

## 2011-12-31 NOTE — Progress Notes (Signed)
Subjective:    Patient ID: Noah Rodriguez, male    DOB: [birth year], 36 y.o.   MRN: [id number]  Cough This is a new problem. Episode onset: 2 months ago. The problem has been unchanged. The problem occurs every few hours. The cough is non-productive. Associated symptoms include headaches, shortness of breath and wheezing. Pertinent negatives include no chest pain, chills, ear congestion, ear pain, fever, heartburn, hemoptysis, myalgias, nasal congestion, postnasal drip, rash, rhinorrhea, sore throat, sweats or weight loss. The symptoms are aggravated by cold air. Risk factors for lung disease include occupational exposure. He has tried nothing for the symptoms. His past medical history is significant for asthma.  Hypertension This is a chronic problem. The current episode started more than 1 year ago. The problem has been gradually worsening since onset. The problem is uncontrolled. Associated symptoms include headaches and shortness of breath. Pertinent negatives include no anxiety, blurred vision, chest pain, malaise/fatigue, neck pain, orthopnea, palpitations, peripheral edema, PND or sweats. There are no associated agents to hypertension. Past treatments include nothing. Compliance problems include psychosocial issues.       Review of Systems  Constitutional: Negative for fever, chills, weight loss, malaise/fatigue, diaphoresis, activity change, appetite change, fatigue and unexpected weight change.  HENT: Negative for ear pain, sore throat, rhinorrhea, neck pain and postnasal drip.   Eyes: Negative.  Negative for blurred vision.  Respiratory: Positive for cough, shortness of breath and wheezing. Negative for apnea, hemoptysis, choking, chest tightness and stridor.   Cardiovascular: Negative for chest pain, palpitations, orthopnea and PND.  Gastrointestinal: Negative.  Negative for heartburn, abdominal pain and blood in stool.  Genitourinary: Negative.   Musculoskeletal: Negative for  myalgias, back pain, joint swelling, arthralgias and gait problem.  Skin: Negative for color change, pallor, rash and wound.  Neurological: Positive for headaches. Negative for dizziness, tremors, seizures, syncope, facial asymmetry, speech difficulty, weakness, light-headedness and numbness.  Hematological: Negative for adenopathy. Does not bruise/bleed easily.  Psychiatric/Behavioral: Negative.        Objective:   Physical Exam  Vitals reviewed. Constitutional: He is oriented to person, place, and time. He appears well-developed and well-nourished.  Non-toxic appearance. He does not have a sickly appearance. He does not appear ill. No distress.  HENT:  Head: Normocephalic and atraumatic.  Mouth/Throat: Oropharynx is clear and moist. No oropharyngeal exudate.  Eyes: Conjunctivae normal are normal. Right eye exhibits no discharge. Left eye exhibits no discharge. No scleral icterus.  Neck: Normal range of motion. Neck supple. No JVD present. No tracheal deviation present. No thyromegaly present.  Cardiovascular: Normal rate, regular rhythm, S1 normal, S2 normal, normal heart sounds and intact distal pulses.  Exam reveals no gallop and no friction rub.   No murmur heard. Pulses:      Carotid pulses are 1+ on the right side, and 1+ on the left side.      Radial pulses are 1+ on the right side, and 1+ on the left side.       Femoral pulses are 1+ on the right side, and 1+ on the left side.      Popliteal pulses are 1+ on the right side, and 1+ on the left side.       Dorsalis pedis pulses are 1+ on the right side, and 1+ on the left side.       Posterior tibial pulses are 1+ on the right side, and 1+ on the left side.  Pulmonary/Chest: Effort normal and breath sounds normal. No accessory muscle usage  or stridor. Not tachypneic. No respiratory distress. He has no decreased breath sounds. He has no wheezes. He has no rhonchi. He has no rales. He exhibits no tenderness.  Abdominal: Soft. Bowel  sounds are normal. He exhibits no distension and no mass. There is no tenderness. There is no rebound and no guarding.  Musculoskeletal: Normal range of motion. He exhibits no edema and no tenderness.  Lymphadenopathy:    He has no cervical adenopathy.  Neurological: He is oriented to person, place, and time.  Skin: Skin is warm and dry. No rash noted. He is not diaphoretic. No erythema. No pallor.  Psychiatric: He has a normal mood and affect. His behavior is normal. Judgment and thought content normal.      Lab Results  Component Value Date   WBC 5.4 11/26/2009   HGB 14.7 11/26/2009   HCT 44.2 11/26/2009   PLT 322.0 11/26/2009   GLUCOSE 89 11/26/2009   CHOL 134 11/26/2009   TRIG 62.0 11/26/2009   HDL 36.20* 11/26/2009   LDLCALC 85 11/26/2009   ALT 47 11/26/2009   AST 39* 11/26/2009   NA 139 11/26/2009   K 4.8 11/26/2009   CL 101 11/26/2009   CREATININE 1.4 11/26/2009   BUN 17 11/26/2009   CO2 31 11/26/2009   TSH 1.76 11/26/2009      Assessment & Plan:

## 2012-01-02 ENCOUNTER — Encounter: Payer: Self-pay | Admitting: Internal Medicine

## 2012-01-02 DIAGNOSIS — I517 Cardiomegaly: Secondary | ICD-10-CM | POA: Insufficient documentation

## 2012-01-02 NOTE — Assessment & Plan Note (Signed)
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

## 2012-01-02 NOTE — Assessment & Plan Note (Signed)
His Chest xray is clear with the exception of cardiomegaly

## 2012-01-02 NOTE — Assessment & Plan Note (Signed)
Will start treatment with benicar-hct I will check his labs today to look for secondary cause and end organ damage

## 2012-01-02 NOTE — Assessment & Plan Note (Signed)
I will check his labs today to look for secondary causes and end organ damage 

## 2012-01-02 NOTE — Assessment & Plan Note (Signed)
This was present on his CXR today I have asked him to return for further evaluation

## 2012-01-14 ENCOUNTER — Ambulatory Visit: Payer: Self-pay | Admitting: Internal Medicine

## 2012-05-24 ENCOUNTER — Ambulatory Visit: Payer: 59 | Admitting: Internal Medicine

## 2012-05-24 DIAGNOSIS — Z0289 Encounter for other administrative examinations: Secondary | ICD-10-CM

## 2012-05-29 ENCOUNTER — Ambulatory Visit (INDEPENDENT_AMBULATORY_CARE_PROVIDER_SITE_OTHER): Payer: 59 | Admitting: Internal Medicine

## 2012-05-29 ENCOUNTER — Encounter: Payer: Self-pay | Admitting: Internal Medicine

## 2012-05-29 ENCOUNTER — Other Ambulatory Visit (INDEPENDENT_AMBULATORY_CARE_PROVIDER_SITE_OTHER): Payer: 59

## 2012-05-29 VITALS — BP 136/84 | HR 84 | Temp 98.0°F | Resp 16 | Ht 66.0 in | Wt 293.0 lb

## 2012-05-29 DIAGNOSIS — I517 Cardiomegaly: Secondary | ICD-10-CM

## 2012-05-29 DIAGNOSIS — R0609 Other forms of dyspnea: Secondary | ICD-10-CM

## 2012-05-29 DIAGNOSIS — J4530 Mild persistent asthma, uncomplicated: Secondary | ICD-10-CM

## 2012-05-29 DIAGNOSIS — J45901 Unspecified asthma with (acute) exacerbation: Secondary | ICD-10-CM

## 2012-05-29 DIAGNOSIS — I119 Hypertensive heart disease without heart failure: Secondary | ICD-10-CM

## 2012-05-29 DIAGNOSIS — R0600 Dyspnea, unspecified: Secondary | ICD-10-CM

## 2012-05-29 DIAGNOSIS — J45909 Unspecified asthma, uncomplicated: Secondary | ICD-10-CM

## 2012-05-29 DIAGNOSIS — I10 Essential (primary) hypertension: Secondary | ICD-10-CM

## 2012-05-29 LAB — COMPREHENSIVE METABOLIC PANEL
ALT: 52 U/L (ref 0–53)
Albumin: 4 g/dL (ref 3.5–5.2)
Alkaline Phosphatase: 65 U/L (ref 39–117)
CO2: 30 mEq/L (ref 19–32)
Glucose, Bld: 95 mg/dL (ref 70–99)
Potassium: 3.9 mEq/L (ref 3.5–5.1)
Sodium: 137 mEq/L (ref 135–145)
Total Protein: 7.9 g/dL (ref 6.0–8.3)

## 2012-05-29 LAB — TSH: TSH: 1.73 u[IU]/mL (ref 0.35–5.50)

## 2012-05-29 MED ORDER — OLMESARTAN MEDOXOMIL-HCTZ 40-25 MG PO TABS: 1.0000 | ORAL_TABLET | Freq: Every day | ORAL | Status: DC

## 2012-05-29 MED ORDER — BECLOMETHASONE DIPROPIONATE 40 MCG/ACT IN AERS: 2.0000 | INHALATION_SPRAY | Freq: Two times a day (BID) | RESPIRATORY_TRACT | Status: DC

## 2012-05-29 NOTE — Assessment & Plan Note (Signed)
His EKG shows NSR with LAE and left axis and reciprocal changes in V4-V6, this is unchanged from his prior EKG Today I will check his labs for fluid overload, ischemia Will also treat his asthma

## 2012-05-29 NOTE — Assessment & Plan Note (Signed)
I have asked him to get an ECHO done to see if he has LVH, diastolic dysfunction, etc.

## 2012-05-29 NOTE — Patient Instructions (Signed)

## 2012-05-29 NOTE — Progress Notes (Signed)
Subjective:    Patient ID: Noah Rodriguez, male    DOB: Feb 28, 1975, 37 y.o.   MRN: [id number]  Hypertension This is a chronic problem. The current episode started more than 1 year ago. The problem is uncontrolled. Associated symptoms include malaise/fatigue and shortness of breath. Pertinent negatives include no anxiety, blurred vision, chest pain, headaches, neck pain, orthopnea, palpitations, peripheral edema, PND or sweats. Risk factors for coronary artery disease include obesity and male gender. Treatments tried: he has no taken benicar-hct for one month. The current treatment provides moderate improvement. Compliance problems include psychosocial issues, exercise and diet.  Hypertensive end-organ damage includes heart failure.      Review of Systems  Constitutional: Positive for malaise/fatigue and fatigue. Negative for fever, chills, diaphoresis, activity change, appetite change and unexpected weight change.  HENT: Negative.  Negative for neck pain.   Eyes: Negative.  Negative for blurred vision.  Respiratory: Positive for shortness of breath and wheezing. Negative for apnea, cough, choking, chest tightness and stridor.   Cardiovascular: Negative.  Negative for chest pain, palpitations, orthopnea, leg swelling and PND.  Gastrointestinal: Negative.  Negative for nausea, vomiting, abdominal pain, diarrhea, constipation, abdominal distention and anal bleeding.  Endocrine: Negative.   Genitourinary: Negative.   Musculoskeletal: Negative.  Negative for myalgias, back pain, joint swelling, arthralgias and gait problem.  Skin: Negative.  Negative for color change, pallor, rash and wound.  Allergic/Immunologic: Negative.   Neurological: Negative.  Negative for dizziness, tremors, weakness, light-headedness and headaches.  Hematological: Negative.  Negative for adenopathy. Does not bruise/bleed easily.  Psychiatric/Behavioral: Negative.        Objective:   Physical Exam  Vitals  reviewed. Constitutional: He is oriented to person, place, and time. He appears well-developed and well-nourished. No distress.  HENT:  Head: Normocephalic and atraumatic.  Mouth/Throat: Oropharynx is clear and moist. No oropharyngeal exudate.  Eyes: Conjunctivae are normal. Right eye exhibits no discharge. Left eye exhibits no discharge. No scleral icterus.  Neck: Normal range of motion. Neck supple. No JVD present. No tracheal deviation present. No thyromegaly present.  Cardiovascular: Normal rate, regular rhythm, normal heart sounds and intact distal pulses.  Exam reveals no gallop and no friction rub.   No murmur heard. Pulmonary/Chest: Effort normal and breath sounds normal. No stridor. No respiratory distress. He has no wheezes. He has no rales. He exhibits no tenderness.  Abdominal: Soft. Bowel sounds are normal. He exhibits no distension and no mass. There is no tenderness. There is no rebound and no guarding.  Musculoskeletal: Normal range of motion. He exhibits no edema and no tenderness.  Lymphadenopathy:    He has no cervical adenopathy.  Neurological: He is oriented to person, place, and time.  Skin: Skin is warm and dry. No rash noted. He is not diaphoretic. No erythema. No pallor.  Psychiatric: He has a normal mood and affect. His behavior is normal. Judgment and thought content normal.     Lab Results  Component Value Date   WBC 4.4* 12/31/2011   HGB 14.6 12/31/2011   HCT 43.7 12/31/2011   PLT 263.0 12/31/2011   GLUCOSE 99 12/31/2011   CHOL 120 12/31/2011   TRIG 71.0 12/31/2011   HDL 30.30* 12/31/2011   LDLCALC 76 12/31/2011   ALT 46 12/31/2011   AST 53* 12/31/2011   NA 138 12/31/2011   K 3.6 12/31/2011   CL 101 12/31/2011   CREATININE 1.4 12/31/2011   BUN 14 12/31/2011   CO2 29 12/31/2011   TSH 3.34 12/31/2011  HGBA1C 6.3 12/31/2011       Assessment & Plan:

## 2012-05-29 NOTE — Assessment & Plan Note (Signed)
Restart Benicar-HCT Check his ECHO (?LVH) I will check his lytes and renal function today

## 2012-05-29 NOTE — Assessment & Plan Note (Signed)
Start Qvar

## 2012-06-07 ENCOUNTER — Other Ambulatory Visit (HOSPITAL_COMMUNITY): Payer: 59

## 2012-06-13 ENCOUNTER — Encounter: Payer: 59 | Admitting: Internal Medicine

## 2012-06-21 ENCOUNTER — Other Ambulatory Visit (HOSPITAL_COMMUNITY): Payer: 59

## 2012-06-22 ENCOUNTER — Encounter: Payer: 59 | Admitting: Internal Medicine

## 2012-06-22 DIAGNOSIS — Z0289 Encounter for other administrative examinations: Secondary | ICD-10-CM

## 2012-11-29 ENCOUNTER — Ambulatory Visit (INDEPENDENT_AMBULATORY_CARE_PROVIDER_SITE_OTHER)
Admission: RE | Admit: 2012-11-29 | Discharge: 2012-11-29 | Disposition: A | Discharge status: Home / Self Care | Payer: 59 | Source: Ambulatory Visit | Attending: Internal Medicine | Admitting: Internal Medicine

## 2012-11-29 ENCOUNTER — Encounter: Payer: Self-pay | Admitting: Internal Medicine

## 2012-11-29 ENCOUNTER — Ambulatory Visit (INDEPENDENT_AMBULATORY_CARE_PROVIDER_SITE_OTHER): Payer: 59 | Admitting: Internal Medicine

## 2012-11-29 VITALS — BP 160/110 | HR 75 | Temp 98.6°F | Resp 16 | Ht 66.0 in | Wt 299.0 lb

## 2012-11-29 DIAGNOSIS — J45901 Unspecified asthma with (acute) exacerbation: Secondary | ICD-10-CM

## 2012-11-29 DIAGNOSIS — I517 Cardiomegaly: Secondary | ICD-10-CM

## 2012-11-29 DIAGNOSIS — R05 Cough: Secondary | ICD-10-CM

## 2012-11-29 DIAGNOSIS — Z23 Encounter for immunization: Secondary | ICD-10-CM

## 2012-11-29 DIAGNOSIS — J4530 Mild persistent asthma, uncomplicated: Secondary | ICD-10-CM

## 2012-11-29 DIAGNOSIS — R059 Cough, unspecified: Secondary | ICD-10-CM

## 2012-11-29 DIAGNOSIS — I119 Hypertensive heart disease without heart failure: Secondary | ICD-10-CM

## 2012-11-29 DIAGNOSIS — I10 Essential (primary) hypertension: Secondary | ICD-10-CM

## 2012-11-29 MED ORDER — BECLOMETHASONE DIPROPIONATE 40 MCG/ACT IN AERS: 2.0000 | INHALATION_SPRAY | Freq: Two times a day (BID) | RESPIRATORY_TRACT | Status: DC

## 2012-11-29 MED ORDER — ALBUTEROL SULFATE HFA 108 (90 BASE) MCG/ACT IN AERS: 2.0000 | INHALATION_SPRAY | Freq: Four times a day (QID) | RESPIRATORY_TRACT | Status: DC | PRN

## 2012-11-29 MED ORDER — METHYLPREDNISOLONE ACETATE 80 MG/ML IJ SUSP
120.0000 mg | Freq: Once | INTRAMUSCULAR | Status: AC
  Administered 2012-11-29: 120 mg via INTRAMUSCULAR

## 2012-11-29 MED ORDER — OLMESARTAN MEDOXOMIL-HCTZ 40-25 MG PO TABS: 1.0000 | ORAL_TABLET | Freq: Every day | ORAL | Status: DC

## 2012-11-29 NOTE — Assessment & Plan Note (Signed)
He did not get the ECHO that I had ordered 6 months ago so I have reordered it today to see if he has LVH, diastolic dysfunction, etc.

## 2012-11-29 NOTE — Assessment & Plan Note (Signed)
His CXR is negative for pna, mass , edema

## 2012-11-29 NOTE — Patient Instructions (Signed)

## 2012-11-29 NOTE — Progress Notes (Signed)
Pre visit review using our clinic review tool, if applicable. No additional management support is needed unless otherwise documented below in the visit note. 

## 2012-11-29 NOTE — Assessment & Plan Note (Signed)
He is having a flare up of his asthma so I gave him an injection of depo-medrol IM He has not been using the inhalers so I have asked him to restart Qvar and to use albuterol as needed

## 2012-11-29 NOTE — Progress Notes (Signed)
Subjective:    Patient ID: Noah Rodriguez, male    DOB: [birth year], 37 y.o.   MRN: [id number]  Cough This is a recurrent problem. The current episode started 1 to 4 weeks ago. The problem has been waxing and waning. The problem occurs every few hours. The cough is non-productive. Associated symptoms include shortness of breath and wheezing. Pertinent negatives include no chest pain, chills, ear congestion, ear pain, fever, headaches, heartburn, hemoptysis, myalgias, nasal congestion, postnasal drip, rash, rhinorrhea, sore throat, sweats or weight loss. He has tried nothing for the symptoms. The treatment provided no relief. His past medical history is significant for asthma. There is no history of bronchiectasis, bronchitis, COPD, emphysema, environmental allergies or pneumonia.      Review of Systems  Constitutional: Negative.  Negative for fever, chills, weight loss, diaphoresis, activity change, appetite change, fatigue and unexpected weight change.  HENT: Negative.  Negative for ear pain, postnasal drip, rhinorrhea and sore throat.   Eyes: Negative.   Respiratory: Positive for cough, shortness of breath and wheezing. Negative for apnea, hemoptysis, choking, chest tightness and stridor.   Cardiovascular: Negative.  Negative for chest pain, palpitations and leg swelling.  Gastrointestinal: Negative.  Negative for heartburn, nausea, vomiting, abdominal pain, diarrhea, constipation and blood in stool.  Endocrine: Negative.   Genitourinary: Negative.   Musculoskeletal: Negative.  Negative for myalgias.  Skin: Negative.  Negative for rash.  Allergic/Immunologic: Negative.  Negative for environmental allergies.  Neurological: Negative.  Negative for dizziness, tremors, weakness, light-headedness and headaches.  Hematological: Negative.  Negative for adenopathy. Does not bruise/bleed easily.  Psychiatric/Behavioral: Negative.        Objective:   Physical Exam  Vitals  reviewed. Constitutional: He is oriented to person, place, and time. He appears well-developed and well-nourished. No distress.  HENT:  Head: Normocephalic and atraumatic.  Mouth/Throat: Oropharynx is clear and moist. No oropharyngeal exudate.  Eyes: Conjunctivae are normal. Right eye exhibits no discharge. Left eye exhibits no discharge. No scleral icterus.  Neck: Normal range of motion. Neck supple. No JVD present. No tracheal deviation present. No thyromegaly present.  Cardiovascular: Normal rate, regular rhythm, normal heart sounds and intact distal pulses.  Exam reveals no gallop and no friction rub.   No murmur heard. Pulmonary/Chest: Effort normal and breath sounds normal. No accessory muscle usage or stridor. Not tachypneic. No respiratory distress. He has no decreased breath sounds. He has no wheezes. He has no rhonchi. He has no rales. He exhibits no tenderness.  Abdominal: Soft. Bowel sounds are normal. He exhibits no distension and no mass. There is no tenderness. There is no rebound and no guarding.  Musculoskeletal: Normal range of motion. He exhibits no edema and no tenderness.  Lymphadenopathy:    He has no cervical adenopathy.  Neurological: He is oriented to person, place, and time.  Skin: Skin is warm and dry. No rash noted. He is not diaphoretic. No erythema. No pallor.    Lab Results  Component Value Date   WBC 4.4* 12/31/2011   HGB 14.6 12/31/2011   HCT 43.7 12/31/2011   PLT 263.0 12/31/2011   GLUCOSE 95 05/29/2012   CHOL 120 12/31/2011   TRIG 71.0 12/31/2011   HDL 30.30* 12/31/2011   LDLCALC 76 12/31/2011   ALT 52 05/29/2012   AST 34 05/29/2012   NA 137 05/29/2012   K 3.9 05/29/2012   CL 99 05/29/2012   CREATININE 1.4 05/29/2012   BUN 16 05/29/2012   CO2 30 05/29/2012   TSH 1.73  05/29/2012   HGBA1C 6.3 12/31/2011        Assessment & Plan:

## 2012-11-29 NOTE — Assessment & Plan Note (Signed)
His BP is not well controlled - he has not been compliant with the Benicar-HCT I gave him samples and have asked him to start taking it as prescribed

## 2012-12-14 ENCOUNTER — Other Ambulatory Visit (HOSPITAL_COMMUNITY): Payer: 59

## 2012-12-14 ENCOUNTER — Encounter: Payer: Self-pay | Admitting: Cardiology

## 2012-12-14 ENCOUNTER — Ambulatory Visit (HOSPITAL_COMMUNITY): Payer: 59 | Attending: Cardiology | Admitting: Radiology

## 2012-12-14 ENCOUNTER — Encounter: Payer: Self-pay | Admitting: Internal Medicine

## 2012-12-14 DIAGNOSIS — I10 Essential (primary) hypertension: Secondary | ICD-10-CM | POA: Insufficient documentation

## 2012-12-14 DIAGNOSIS — R059 Cough, unspecified: Secondary | ICD-10-CM | POA: Insufficient documentation

## 2012-12-14 DIAGNOSIS — R0602 Shortness of breath: Secondary | ICD-10-CM

## 2012-12-14 DIAGNOSIS — R05 Cough: Secondary | ICD-10-CM | POA: Insufficient documentation

## 2012-12-14 DIAGNOSIS — R0609 Other forms of dyspnea: Secondary | ICD-10-CM | POA: Insufficient documentation

## 2012-12-14 DIAGNOSIS — R0989 Other specified symptoms and signs involving the circulatory and respiratory systems: Secondary | ICD-10-CM | POA: Insufficient documentation

## 2012-12-14 DIAGNOSIS — R0600 Dyspnea, unspecified: Secondary | ICD-10-CM

## 2012-12-14 DIAGNOSIS — Z6841 Body Mass Index (BMI) 40.0 and over, adult: Secondary | ICD-10-CM | POA: Insufficient documentation

## 2012-12-14 DIAGNOSIS — I119 Hypertensive heart disease without heart failure: Secondary | ICD-10-CM

## 2012-12-14 DIAGNOSIS — I517 Cardiomegaly: Secondary | ICD-10-CM | POA: Insufficient documentation

## 2012-12-14 DIAGNOSIS — J45909 Unspecified asthma, uncomplicated: Secondary | ICD-10-CM | POA: Insufficient documentation

## 2012-12-14 NOTE — Progress Notes (Signed)
Echocardiogram performed.  

## 2013-04-25 ENCOUNTER — Ambulatory Visit: Payer: 59 | Admitting: Internal Medicine

## 2014-01-16 ENCOUNTER — Encounter: Payer: Self-pay | Admitting: Internal Medicine

## 2014-01-16 ENCOUNTER — Ambulatory Visit (INDEPENDENT_AMBULATORY_CARE_PROVIDER_SITE_OTHER): Payer: 59 | Admitting: Internal Medicine

## 2014-01-16 ENCOUNTER — Ambulatory Visit (INDEPENDENT_AMBULATORY_CARE_PROVIDER_SITE_OTHER): Payer: 59 | Admitting: *Deleted

## 2014-01-16 ENCOUNTER — Other Ambulatory Visit (INDEPENDENT_AMBULATORY_CARE_PROVIDER_SITE_OTHER): Payer: 59

## 2014-01-16 ENCOUNTER — Ambulatory Visit (INDEPENDENT_AMBULATORY_CARE_PROVIDER_SITE_OTHER)
Admission: RE | Admit: 2014-01-16 | Discharge: 2014-01-16 | Disposition: A | Discharge status: Home / Self Care | Payer: 59 | Source: Ambulatory Visit | Attending: Internal Medicine | Admitting: Internal Medicine

## 2014-01-16 VITALS — BP 160/100 | HR 90 | Temp 97.8°F | Ht 66.0 in | Wt 317.0 lb

## 2014-01-16 DIAGNOSIS — J45901 Unspecified asthma with (acute) exacerbation: Secondary | ICD-10-CM

## 2014-01-16 DIAGNOSIS — R739 Hyperglycemia, unspecified: Secondary | ICD-10-CM | POA: Insufficient documentation

## 2014-01-16 DIAGNOSIS — Z0000 Encounter for general adult medical examination without abnormal findings: Secondary | ICD-10-CM

## 2014-01-16 DIAGNOSIS — R05 Cough: Secondary | ICD-10-CM

## 2014-01-16 DIAGNOSIS — I517 Cardiomegaly: Secondary | ICD-10-CM

## 2014-01-16 DIAGNOSIS — I119 Hypertensive heart disease without heart failure: Secondary | ICD-10-CM

## 2014-01-16 DIAGNOSIS — Z23 Encounter for immunization: Secondary | ICD-10-CM

## 2014-01-16 DIAGNOSIS — R059 Cough, unspecified: Secondary | ICD-10-CM

## 2014-01-16 DIAGNOSIS — J4530 Mild persistent asthma, uncomplicated: Secondary | ICD-10-CM

## 2014-01-16 DIAGNOSIS — I10 Essential (primary) hypertension: Secondary | ICD-10-CM

## 2014-01-16 LAB — CBC WITH DIFFERENTIAL/PLATELET
BASOS ABS: 0 10*3/uL (ref 0.0–0.1)
BASOS PCT: 0.7 % (ref 0.0–3.0)
EOS PCT: 2.2 % (ref 0.0–5.0)
Eosinophils Absolute: 0.1 10*3/uL (ref 0.0–0.7)
HCT: 44.3 % (ref 39.0–52.0)
Hemoglobin: 14.1 g/dL (ref 13.0–17.0)
LYMPHS ABS: 1.7 10*3/uL (ref 0.7–4.0)
Lymphocytes Relative: 28.2 % (ref 12.0–46.0)
MCHC: 32 g/dL (ref 30.0–36.0)
MCV: 83.9 fl (ref 78.0–100.0)
MONOS PCT: 10.6 % (ref 3.0–12.0)
Monocytes Absolute: 0.6 10*3/uL (ref 0.1–1.0)
NEUTROS ABS: 3.5 10*3/uL (ref 1.4–7.7)
Neutrophils Relative %: 58.3 % (ref 43.0–77.0)
Platelets: 319 10*3/uL (ref 150.0–400.0)
RBC: 5.27 Mil/uL (ref 4.22–5.81)
RDW: 13.8 % (ref 11.5–15.5)
WBC: 6 10*3/uL (ref 4.0–10.5)

## 2014-01-16 LAB — COMPREHENSIVE METABOLIC PANEL
ALT: 43 U/L (ref 0–53)
AST: 33 U/L (ref 0–37)
Albumin: 4.1 g/dL (ref 3.5–5.2)
Alkaline Phosphatase: 80 U/L (ref 39–117)
BUN: 20 mg/dL (ref 6–23)
CALCIUM: 9 mg/dL (ref 8.4–10.5)
CO2: 28 mEq/L (ref 19–32)
Chloride: 102 mEq/L (ref 96–112)
Creatinine, Ser: 1.4 mg/dL (ref 0.4–1.5)
GFR: 70.83 mL/min (ref >60.00)
Glucose, Bld: 103 mg/dL — ABNORMAL HIGH (ref 70–99)
Potassium: 3.9 mEq/L (ref 3.5–5.1)
Sodium: 138 mEq/L (ref 135–145)
TOTAL PROTEIN: 7.4 g/dL (ref 6.0–8.3)
Total Bilirubin: 0.8 mg/dL (ref 0.2–1.2)

## 2014-01-16 LAB — URINALYSIS, ROUTINE W REFLEX MICROSCOPIC
Bilirubin Urine: NEGATIVE
KETONES UR: NEGATIVE
LEUKOCYTES UA: NEGATIVE
Nitrite: NEGATIVE
SPECIFIC GRAVITY, URINE: 1.025 (ref 1.000–1.030)
URINE GLUCOSE: NEGATIVE
Urobilinogen, UA: 0.2 (ref 0.0–1.0)
pH: 6 (ref 5.0–8.0)

## 2014-01-16 LAB — LIPID PANEL
Cholesterol: 141 mg/dL (ref 0–200)
HDL: 33.4 mg/dL — ABNORMAL LOW (ref >39.00)
LDL Cholesterol: 93 mg/dL (ref 0–99)
NONHDL: 107.6
Total CHOL/HDL Ratio: 4
Triglycerides: 73 mg/dL (ref 0.0–149.0)
VLDL: 14.6 mg/dL (ref 0.0–40.0)

## 2014-01-16 LAB — BRAIN NATRIURETIC PEPTIDE: Pro B Natriuretic peptide (BNP): 84 pg/mL (ref 0.0–100.0)

## 2014-01-16 LAB — HEMOGLOBIN A1C: HEMOGLOBIN A1C: 6.4 % (ref 4.6–6.5)

## 2014-01-16 LAB — TSH: TSH: 1.49 u[IU]/mL (ref 0.35–4.50)

## 2014-01-16 IMAGING — CR DG CHEST 2V
2 series · 2 of 2 positions shown · non-contrast
Comparison: PA and lateral chest of [DATE]

CLINICAL DATA: Cough and shortness of breath for the past month;
history of asthma

EXAM:
CHEST  2 VIEW

[view not recorded (1 of 2)]
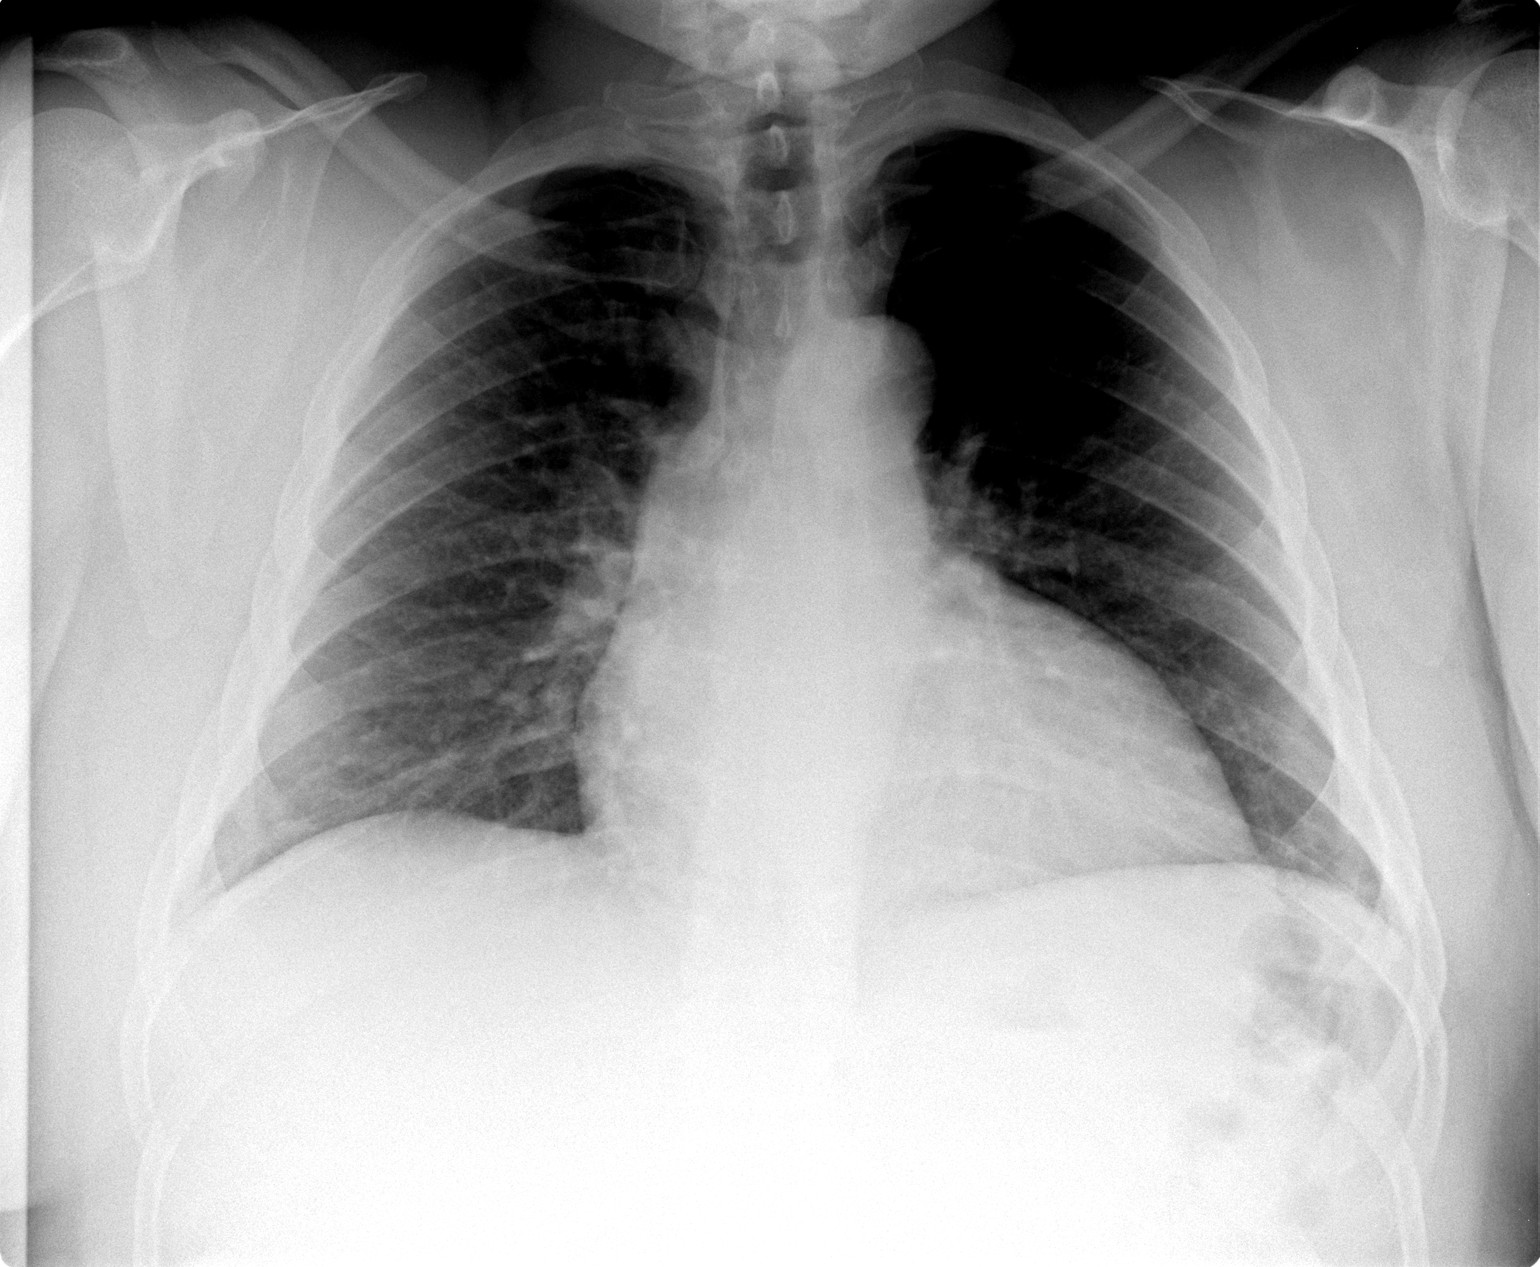

[view not recorded (2 of 2)]
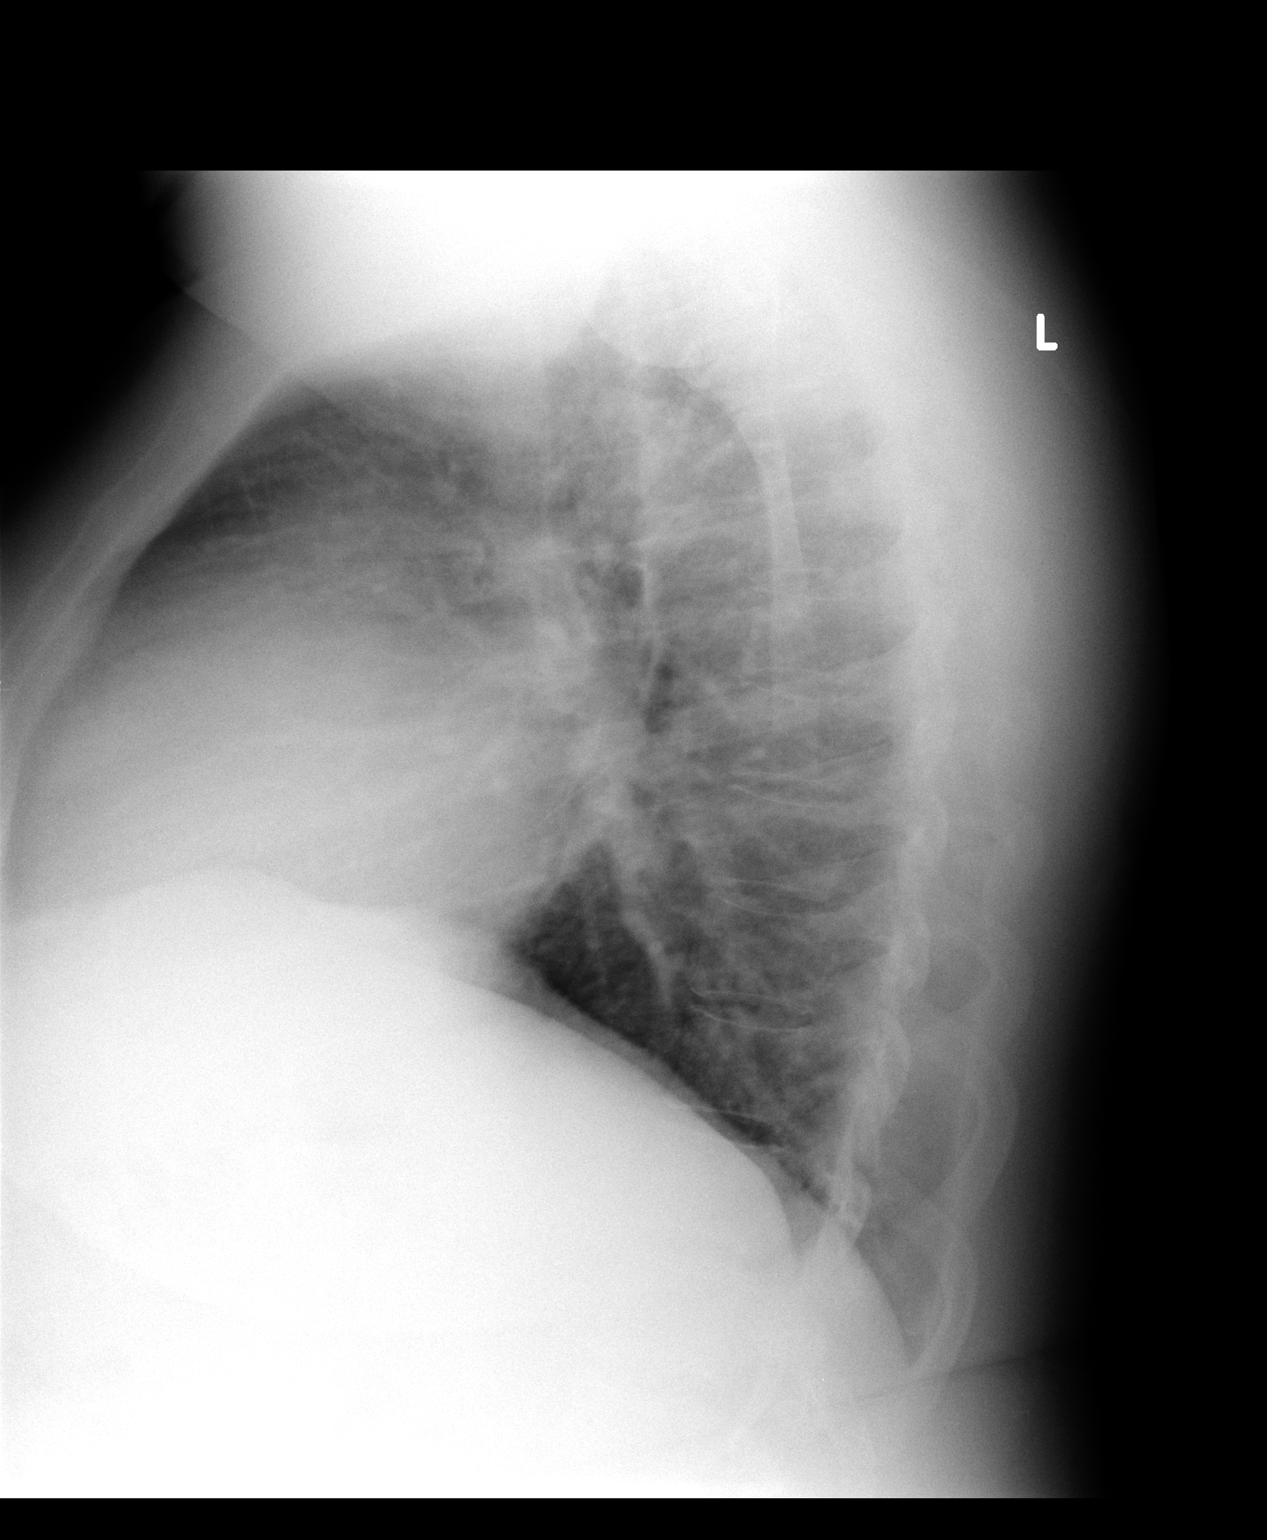

[2 of 2 positions shown; findings below may reference images not displayed]

FINDINGS: The lungs are adequately inflated and clear. The cardiopericardial
silhouette is mildly enlarged. The pulmonary vascularity is not
engorged. The mediastinum is normal in width. There is mild stable
tortuosity of the descending thoracic aorta. There is no pleural
effusion. The bony thorax is unremarkable.
IMPRESSION: There is mild stable cardiac enlargement. There is no evidence of
pneumonia

## 2014-01-16 MED ORDER — OLMESARTAN MEDOXOMIL-HCTZ 40-25 MG PO TABS: 1.0000 | ORAL_TABLET | Freq: Every day | ORAL | Status: DC

## 2014-01-16 MED ORDER — ALBUTEROL SULFATE HFA 108 (90 BASE) MCG/ACT IN AERS: 2.0000 | INHALATION_SPRAY | Freq: Four times a day (QID) | RESPIRATORY_TRACT | Status: DC | PRN

## 2014-01-16 MED ORDER — BECLOMETHASONE DIPROPIONATE 40 MCG/ACT IN AERS: 2.0000 | INHALATION_SPRAY | Freq: Two times a day (BID) | RESPIRATORY_TRACT | Status: DC

## 2014-01-16 NOTE — Assessment & Plan Note (Addendum)
He is not compliant with his meds or lifestyle modifications and his BP is not well controlled I will check his CXR and BNP to see if there is concern for fluid overload I have asked him to restart Benicar-HCT Will monitor his lytes and renal function today

## 2014-01-16 NOTE — Assessment & Plan Note (Signed)
Vaccines were reviewed and updated Exam done Labs ordered Pt ed material was given 

## 2014-01-16 NOTE — Patient Instructions (Signed)
Health Maintenance A healthy lifestyle and preventative care can promote health and wellness.  Maintain regular health, dental, and eye exams.  Eat a healthy diet. Foods like vegetables, fruits, whole grains, low-fat dairy products, and lean protein foods contain the nutrients you need and are low in calories. Decrease your intake of foods high in solid fats, added sugars, and salt. Get information about a proper diet from your health care provider, if necessary.  Regular physical exercise is one of the most important things you can do for your health. Most adults should get at least 150 minutes of moderate-intensity exercise (any activity that increases your heart rate and causes you to sweat) each week. In addition, most adults need muscle-strengthening exercises on 2 or more days a week.   Maintain a healthy weight. The body mass index (BMI) is a screening tool to identify possible weight problems. It provides an estimate of body fat based on height and weight. Your health care provider can find your BMI and can help you achieve or maintain a healthy weight. For males 20 years and older:  A BMI below 18.5 is considered underweight.  A BMI of 18.5 to 24.9 is normal.  A BMI of 25 to 29.9 is considered overweight.  A BMI of 30 and above is considered obese.  Maintain normal blood lipids and cholesterol by exercising and minimizing your intake of saturated fat. Eat a balanced diet with plenty of fruits and vegetables. Blood tests for lipids and cholesterol should begin at age 20 and be repeated every 5 years. If your lipid or cholesterol levels are high, you are over age 50, or you are at high risk for heart disease, you may need your cholesterol levels checked more frequently.Ongoing high lipid and cholesterol levels should be treated with medicines if diet and exercise are not working.  If you smoke, find out from your health care provider how to quit. If you do not use tobacco, do not  start.  Lung cancer screening is recommended for adults aged 55-80 years who are at high risk for developing lung cancer because of a history of smoking. A yearly low-dose CT scan of the lungs is recommended for people who have at least a 30-pack-year history of smoking and are current smokers or have quit within the past 15 years. A pack year of smoking is smoking an average of 1 pack of cigarettes a day for 1 year (for example, a 30-pack-year history of smoking could mean smoking 1 pack a day for 30 years or 2 packs a day for 15 years). Yearly screening should continue until the smoker has stopped smoking for at least 15 years. Yearly screening should be stopped for people who develop a health problem that would prevent them from having lung cancer treatment.  If you choose to drink alcohol, do not have more than 2 drinks per day. One drink is considered to be 12 oz (360 mL) of beer, 5 oz (150 mL) of wine, or 1.5 oz (45 mL) of liquor.  Avoid the use of street drugs. Do not share needles with anyone. Ask for help if you need support or instructions about stopping the use of drugs.  High blood pressure causes heart disease and increases the risk of stroke. Blood pressure should be checked at least every 1-2 years. Ongoing high blood pressure should be treated with medicines if weight loss and exercise are not effective.  If you are 45-79 years old, ask your health care provider if   you should take aspirin to prevent heart disease.  Diabetes screening involves taking a blood sample to check your fasting blood sugar level. This should be done once every 3 years after age 45 if you are at a normal weight and without risk factors for diabetes. Testing should be considered at a younger age or be carried out more frequently if you are overweight and have at least 1 risk factor for diabetes.  Colorectal cancer can be detected and often prevented. Most routine colorectal cancer screening begins at the age of 50  and continues through age 75. However, your health care provider may recommend screening at an earlier age if you have risk factors for colon cancer. On a yearly basis, your health care provider may provide home test kits to check for hidden blood in the stool. A small camera at the end of a tube may be used to directly examine the colon (sigmoidoscopy or colonoscopy) to detect the earliest forms of colorectal cancer. Talk to your health care provider about this at age 50 when routine screening begins. A direct exam of the colon should be repeated every 5-10 years through age 75, unless early forms of precancerous polyps or small growths are found.  People who are at an increased risk for hepatitis B should be screened for this virus. You are considered at high risk for hepatitis B if:  You were born in a country where hepatitis B occurs often. Talk with your health care provider about which countries are considered high risk.  Your parents were born in a high-risk country and you have not received a shot to protect against hepatitis B (hepatitis B vaccine).  You have HIV or AIDS.  You use needles to inject street drugs.  You live with, or have sex with, someone who has hepatitis B.  You are a man who has sex with other men (MSM).  You get hemodialysis treatment.  You take certain medicines for conditions like cancer, organ transplantation, and autoimmune conditions.  Hepatitis C blood testing is recommended for all people born from 1945 through 1965 and any individual with known risk factors for hepatitis C.  Healthy men should no longer receive prostate-specific antigen (PSA) blood tests as part of routine cancer screening. Talk to your health care provider about prostate cancer screening.  Testicular cancer screening is not recommended for adolescents or adult males who have no symptoms. Screening includes self-exam, a health care provider exam, and other screening tests. Consult with your  health care provider about any symptoms you have or any concerns you have about testicular cancer.  Practice safe sex. Use condoms and avoid high-risk sexual practices to reduce the spread of sexually transmitted infections (STIs).  You should be screened for STIs, including gonorrhea and chlamydia if:  You are sexually active and are younger than 24 years.  You are older than 24 years, and your health care provider tells you that you are at risk for this type of infection.  Your sexual activity has changed since you were last screened, and you are at an increased risk for chlamydia or gonorrhea. Ask your health care provider if you are at risk.  If you are at risk of being infected with HIV, it is recommended that you take a prescription medicine daily to prevent HIV infection. This is called pre-exposure prophylaxis (PrEP). You are considered at risk if:  You are a man who has sex with other men (MSM).  You are a heterosexual man who   is sexually active with multiple partners.  You take drugs by injection.  You are sexually active with a partner who has HIV.  Talk with your health care provider about whether you are at high risk of being infected with HIV. If you choose to begin PrEP, you should first be tested for HIV. You should then be tested every 3 months for as long as you are taking PrEP.  Use sunscreen. Apply sunscreen liberally and repeatedly throughout the day. You should seek shade when your shadow is shorter than you. Protect yourself by wearing long sleeves, pants, a wide-brimmed hat, and sunglasses year round whenever you are outdoors.  Tell your health care provider of new moles or changes in moles, especially if there is a change in shape or color. Also, tell your health care provider if a mole is larger than the size of a pencil eraser.  A one-time screening for abdominal aortic aneurysm (AAA) and surgical repair of large AAAs by ultrasound is recommended for men aged  65-75 years who are current or former smokers.  Stay current with your vaccines (immunizations). Document Released: 06/26/2007 Document Revised: 01/02/2013 Document Reviewed: 05/25/2010 Exit[employer] Patient Information 2015 [employer]. This information is not intended to replace advice given to you by your health care provider. Make sure you discuss any questions you have with your health care provider. Cough, Adult  A cough is a reflex that helps clear your throat and airways. It can help heal the body or may be a reaction to an irritated airway. A cough may only last 2 or 3 weeks (acute) or may last more than 8 weeks (chronic).  CAUSES Acute cough:  Viral or bacterial infections. Chronic cough:  Infections.  Allergies.  Asthma.  Post-nasal drip.  Smoking.  Heartburn or acid reflux.  Some medicines.  Chronic lung problems (COPD).  Cancer. SYMPTOMS   Cough.  Fever.  Chest pain.  Increased breathing rate.  High-pitched whistling sound when breathing (wheezing).  Colored mucus that you cough up (sputum). TREATMENT   A bacterial cough may be treated with antibiotic medicine.  A viral cough must run its course and will not respond to antibiotics.  Your caregiver may recommend other treatments if you have a chronic cough. HOME CARE INSTRUCTIONS   Only take over-the-counter or prescription medicines for pain, discomfort, or fever as directed by your caregiver. Use cough suppressants only as directed by your caregiver.  Use a cold steam vaporizer or humidifier in your bedroom or home to help loosen secretions.  Sleep in a semi-upright position if your cough is worse at night.  Rest as needed.  Stop smoking if you smoke. SEEK IMMEDIATE MEDICAL CARE IF:   You have pus in your sputum.  Your cough starts to worsen.  You cannot control your cough with suppressants and are losing sleep.  You begin coughing up blood.  You have difficulty breathing.  You  develop pain which is getting worse or is uncontrolled with medicine.  You have a fever. MAKE SURE YOU:   Understand these instructions.  Will watch your condition.  Will get help right away if you are not doing well or get worse. Document Released: 06/26/2010 Document Revised: 03/22/2011 Document Reviewed: 06/26/2010 Exit[employer] Patient Information 2015 [employer]. This information is not intended to replace advice given to you by your health care provider. Make sure you discuss any questions you have with your health care provider.  

## 2014-01-16 NOTE — Progress Notes (Signed)
Subjective:    Patient ID: Noah Rodriguez, male    DOB: March 02, 1975, 39 y.o.   MRN: [id number]  Hypertension This is a chronic problem. The current episode started more than 1 year ago. The problem has been gradually worsening since onset. The problem is uncontrolled. Associated symptoms include peripheral edema. Pertinent negatives include no anxiety, blurred vision, chest pain, headaches, malaise/fatigue, neck pain, orthopnea, palpitations, PND, shortness of breath or sweats. There are no associated agents to hypertension. Past treatments include angiotensin blockers and diuretics. The current treatment provides no improvement. Compliance problems include diet, exercise and psychosocial issues.  Hypertensive end-organ damage includes heart failure. There is no history of angina, kidney disease, CAD/MI, CVA, left ventricular hypertrophy, PVD or retinopathy.      Review of Systems  Constitutional: Negative.  Negative for fever, chills, malaise/fatigue, diaphoresis, appetite change and fatigue.  HENT: Positive for congestion and postnasal drip. Negative for rhinorrhea, sinus pressure, sore throat and trouble swallowing.   Eyes: Negative.  Negative for blurred vision.  Respiratory: Positive for cough. Negative for apnea, choking, chest tightness, shortness of breath, wheezing and stridor.        He has had a NP cough and wheezing for the last 3 weeks with runny nose and nasal congestion.  Cardiovascular: Negative.  Negative for chest pain, palpitations, orthopnea, leg swelling and PND.  Gastrointestinal: Negative.  Negative for nausea, vomiting, abdominal pain, diarrhea, constipation and blood in stool.  Endocrine: Negative.   Genitourinary: Negative.   Musculoskeletal: Negative.  Negative for neck pain.  Skin: Negative.  Negative for rash.  Allergic/Immunologic: Negative.   Neurological: Negative.  Negative for dizziness, weakness, numbness and headaches.  Hematological: Negative.   Negative for adenopathy. Does not bruise/bleed easily.  Psychiatric/Behavioral: Negative.        Objective:   Physical Exam  Constitutional: He is oriented to person, place, and time. He appears well-developed and well-nourished.  Non-toxic appearance. He does not have a sickly appearance. He does not appear ill. No distress.  HENT:  Head: Normocephalic and atraumatic.  Mouth/Throat: Oropharynx is clear and moist. No oropharyngeal exudate.  Eyes: Conjunctivae are normal. Right eye exhibits no discharge. Left eye exhibits no discharge. No scleral icterus.  Neck: Normal range of motion. Neck supple. No JVD present. No tracheal deviation present. No thyromegaly present.  Cardiovascular: Normal rate, regular rhythm, normal heart sounds and intact distal pulses.  Exam reveals no gallop and no friction rub.   No murmur heard. Pulmonary/Chest: Effort normal and breath sounds normal. No stridor. No respiratory distress. He has no wheezes. He has no rales. He exhibits no tenderness.  Abdominal: Soft. Bowel sounds are normal. He exhibits no distension and no mass. There is no tenderness. There is no rebound and no guarding.  Musculoskeletal: Normal range of motion. He exhibits edema (trace edema in BLE). He exhibits no tenderness.  Lymphadenopathy:    He has no cervical adenopathy.  Neurological: He is oriented to person, place, and time.  Skin: Skin is warm and dry. No rash noted. He is not diaphoretic. No erythema. No pallor.  Psychiatric: He has a normal mood and affect. His behavior is normal. Judgment and thought content normal.  Vitals reviewed.    Lab Results  Component Value Date   WBC 4.4* 12/31/2011   HGB 14.6 12/31/2011   HCT 43.7 12/31/2011   PLT 263.0 12/31/2011   GLUCOSE 95 05/29/2012   CHOL 120 12/31/2011   TRIG 71.0 12/31/2011   HDL 30.30* 12/31/2011  LDLCALC 76 12/31/2011   ALT 52 05/29/2012   AST 34 05/29/2012   NA 137 05/29/2012   K 3.9 05/29/2012   CL 99  05/29/2012   CREATININE 1.4 05/29/2012   BUN 16 05/29/2012   CO2 30 05/29/2012   TSH 1.73 05/29/2012   HGBA1C 6.3 12/31/2011       Assessment & Plan:

## 2014-01-16 NOTE — Assessment & Plan Note (Signed)
I will check his A1C to see if he has developed DM2 

## 2014-01-16 NOTE — Assessment & Plan Note (Signed)
I think this is related to the asthma Will restart the inhalers Will check his CXR today to see if there is a mass, edema, or pneumonia

## 2014-01-16 NOTE — Assessment & Plan Note (Signed)
He has had a mild exacerbation and have not been using his inhaler I have asked him to restart the previous inhalers

## 2014-02-06 ENCOUNTER — Ambulatory Visit: Payer: 59 | Admitting: Internal Medicine

## 2014-02-07 ENCOUNTER — Encounter: Payer: Self-pay | Admitting: Internal Medicine

## 2014-02-07 ENCOUNTER — Other Ambulatory Visit: Payer: Self-pay | Admitting: Internal Medicine

## 2014-02-07 DIAGNOSIS — I119 Hypertensive heart disease without heart failure: Secondary | ICD-10-CM

## 2014-02-07 MED ORDER — OLMESARTAN MEDOXOMIL-HCTZ 40-12.5 MG PO TABS: 1.0000 | ORAL_TABLET | Freq: Every day | ORAL | Status: DC

## 2014-05-07 ENCOUNTER — Telehealth: Payer: Self-pay | Admitting: Internal Medicine

## 2014-05-07 DIAGNOSIS — I119 Hypertensive heart disease without heart failure: Secondary | ICD-10-CM

## 2014-05-07 MED ORDER — OLMESARTAN MEDOXOMIL-HCTZ 40-12.5 MG PO TABS: 1.0000 | ORAL_TABLET | Freq: Every day | ORAL | Status: DC

## 2014-05-07 NOTE — Telephone Encounter (Signed)
Sent refill to [facility]mb 

## 2014-05-07 NOTE — Telephone Encounter (Signed)
Patient needs refill for olmesartan-hydrochlorothiazide (BENICAR HCT) 40-12.5 MG per tablet [[id number]] pharmacy is [facility] on [address] In [place].

## 2014-06-11 ENCOUNTER — Encounter: Payer: Self-pay | Admitting: Internal Medicine

## 2014-06-11 ENCOUNTER — Other Ambulatory Visit: Payer: Self-pay | Admitting: Internal Medicine

## 2014-06-11 DIAGNOSIS — I119 Hypertensive heart disease without heart failure: Secondary | ICD-10-CM

## 2014-06-11 MED ORDER — OLMESARTAN MEDOXOMIL-HCTZ 40-12.5 MG PO TABS: 1.0000 | ORAL_TABLET | Freq: Every day | ORAL | Status: DC

## 2014-07-03 ENCOUNTER — Encounter: Payer: 59 | Admitting: Internal Medicine

## 2014-07-03 DIAGNOSIS — Z0289 Encounter for other administrative examinations: Secondary | ICD-10-CM

## 2014-07-04 ENCOUNTER — Telehealth: Payer: Self-pay | Admitting: Internal Medicine

## 2014-07-04 NOTE — Telephone Encounter (Signed)
Patient no showed for cpe. Please advise. °

## 2014-07-18 ENCOUNTER — Emergency Department (HOSPITAL_BASED_OUTPATIENT_CLINIC_OR_DEPARTMENT_OTHER)
Admission: EM | Admit: 2014-07-18 | Discharge: 2014-07-18 | Disposition: A | Discharge status: Home / Self Care | Payer: No Typology Code available for payment source | Attending: Emergency Medicine | Admitting: Emergency Medicine

## 2014-07-18 ENCOUNTER — Encounter (HOSPITAL_BASED_OUTPATIENT_CLINIC_OR_DEPARTMENT_OTHER): Payer: Self-pay | Admitting: *Deleted

## 2014-07-18 ENCOUNTER — Emergency Department (HOSPITAL_BASED_OUTPATIENT_CLINIC_OR_DEPARTMENT_OTHER): Payer: No Typology Code available for payment source

## 2014-07-18 DIAGNOSIS — S3992XA Unspecified injury of lower back, initial encounter: Secondary | ICD-10-CM | POA: Diagnosis present

## 2014-07-18 DIAGNOSIS — Z79899 Other long term (current) drug therapy: Secondary | ICD-10-CM | POA: Diagnosis not present

## 2014-07-18 DIAGNOSIS — J45909 Unspecified asthma, uncomplicated: Secondary | ICD-10-CM | POA: Insufficient documentation

## 2014-07-18 DIAGNOSIS — S39012A Strain of muscle, fascia and tendon of lower back, initial encounter: Secondary | ICD-10-CM

## 2014-07-18 DIAGNOSIS — Y9389 Activity, other specified: Secondary | ICD-10-CM | POA: Insufficient documentation

## 2014-07-18 DIAGNOSIS — Y998 Other external cause status: Secondary | ICD-10-CM | POA: Diagnosis not present

## 2014-07-18 DIAGNOSIS — Y92410 Unspecified street and highway as the place of occurrence of the external cause: Secondary | ICD-10-CM | POA: Insufficient documentation

## 2014-07-18 DIAGNOSIS — I10 Essential (primary) hypertension: Secondary | ICD-10-CM | POA: Insufficient documentation

## 2014-07-18 DIAGNOSIS — M5441 Lumbago with sciatica, right side: Secondary | ICD-10-CM | POA: Insufficient documentation

## 2014-07-18 DIAGNOSIS — Z7951 Long term (current) use of inhaled steroids: Secondary | ICD-10-CM | POA: Diagnosis not present

## 2014-07-18 MED ORDER — HYDROCODONE-ACETAMINOPHEN 5-325 MG PO TABS: 1.0000 | ORAL_TABLET | Freq: Four times a day (QID) | ORAL | Status: DC | PRN

## 2014-07-18 MED ORDER — CYCLOBENZAPRINE HCL 10 MG PO TABS: 10.0000 mg | ORAL_TABLET | Freq: Two times a day (BID) | ORAL | Status: DC | PRN

## 2014-07-18 MED ORDER — NAPROXEN 500 MG PO TABS: 500.0000 mg | ORAL_TABLET | Freq: Two times a day (BID) | ORAL | Status: DC
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
## 2014-07-18 NOTE — Discharge Instructions (Signed)
X-rays of the back showed no bony injuries. Will treat symptomatically. Take the Naprosyn on a regular basis take the Flexeril as directed. Supplement with hydrocodone as needed. Make an appointment to follow-up with your record Dr. Return for any new or worse symptoms. Work note provided.

## 2014-07-18 NOTE — ED Notes (Signed)
Pt directed to pharmacy to pick up meds 

## 2014-07-18 NOTE — ED Notes (Signed)
MD at bedside. 

## 2014-09-09 ENCOUNTER — Observation Stay (HOSPITAL_COMMUNITY): Payer: 59

## 2014-09-09 ENCOUNTER — Encounter (HOSPITAL_BASED_OUTPATIENT_CLINIC_OR_DEPARTMENT_OTHER): Payer: Self-pay | Admitting: *Deleted

## 2014-09-09 ENCOUNTER — Emergency Department (HOSPITAL_BASED_OUTPATIENT_CLINIC_OR_DEPARTMENT_OTHER): Payer: 59

## 2014-09-09 ENCOUNTER — Inpatient Hospital Stay (HOSPITAL_BASED_OUTPATIENT_CLINIC_OR_DEPARTMENT_OTHER)
Admission: EM | Admit: 2014-09-09 | Discharge: 2014-09-20 | DRG: 287 | Disposition: A | Discharge status: Home / Self Care | Payer: 59 | Attending: Cardiovascular Disease | Admitting: Cardiovascular Disease

## 2014-09-09 DIAGNOSIS — Z791 Long term (current) use of non-steroidal anti-inflammatories (NSAID): Secondary | ICD-10-CM

## 2014-09-09 DIAGNOSIS — E876 Hypokalemia: Secondary | ICD-10-CM | POA: Insufficient documentation

## 2014-09-09 DIAGNOSIS — E66813 Obesity, class 3: Secondary | ICD-10-CM | POA: Diagnosis present

## 2014-09-09 DIAGNOSIS — I428 Other cardiomyopathies: Secondary | ICD-10-CM

## 2014-09-09 DIAGNOSIS — Z6841 Body Mass Index (BMI) 40.0 and over, adult: Secondary | ICD-10-CM

## 2014-09-09 DIAGNOSIS — I5043 Acute on chronic combined systolic (congestive) and diastolic (congestive) heart failure: Secondary | ICD-10-CM | POA: Diagnosis not present

## 2014-09-09 DIAGNOSIS — J4530 Mild persistent asthma, uncomplicated: Secondary | ICD-10-CM | POA: Diagnosis present

## 2014-09-09 DIAGNOSIS — I119 Hypertensive heart disease without heart failure: Secondary | ICD-10-CM | POA: Diagnosis present

## 2014-09-09 DIAGNOSIS — R079 Chest pain, unspecified: Secondary | ICD-10-CM | POA: Diagnosis present

## 2014-09-09 DIAGNOSIS — R1013 Epigastric pain: Secondary | ICD-10-CM | POA: Diagnosis not present

## 2014-09-09 DIAGNOSIS — N179 Acute kidney failure, unspecified: Secondary | ICD-10-CM | POA: Diagnosis present

## 2014-09-09 DIAGNOSIS — R7989 Other specified abnormal findings of blood chemistry: Secondary | ICD-10-CM | POA: Diagnosis present

## 2014-09-09 DIAGNOSIS — I130 Hypertensive heart and chronic kidney disease with heart failure and stage 1 through stage 4 chronic kidney disease, or unspecified chronic kidney disease: Secondary | ICD-10-CM | POA: Diagnosis present

## 2014-09-09 DIAGNOSIS — I272 Other secondary pulmonary hypertension: Secondary | ICD-10-CM | POA: Diagnosis present

## 2014-09-09 DIAGNOSIS — G4733 Obstructive sleep apnea (adult) (pediatric): Secondary | ICD-10-CM | POA: Diagnosis present

## 2014-09-09 DIAGNOSIS — I420 Dilated cardiomyopathy: Secondary | ICD-10-CM | POA: Diagnosis present

## 2014-09-09 DIAGNOSIS — R739 Hyperglycemia, unspecified: Secondary | ICD-10-CM | POA: Diagnosis present

## 2014-09-09 DIAGNOSIS — I110 Hypertensive heart disease with heart failure: Secondary | ICD-10-CM | POA: Diagnosis not present

## 2014-09-09 DIAGNOSIS — G4730 Sleep apnea, unspecified: Secondary | ICD-10-CM | POA: Diagnosis present

## 2014-09-09 DIAGNOSIS — Z9119 Patient's noncompliance with other medical treatment and regimen: Secondary | ICD-10-CM | POA: Diagnosis present

## 2014-09-09 DIAGNOSIS — I517 Cardiomegaly: Secondary | ICD-10-CM

## 2014-09-09 DIAGNOSIS — Z91148 Patient's other noncompliance with medication regimen for other reason: Secondary | ICD-10-CM

## 2014-09-09 DIAGNOSIS — I252 Old myocardial infarction: Secondary | ICD-10-CM

## 2014-09-09 DIAGNOSIS — G4710 Hypersomnia, unspecified: Secondary | ICD-10-CM | POA: Diagnosis present

## 2014-09-09 DIAGNOSIS — R778 Other specified abnormalities of plasma proteins: Secondary | ICD-10-CM | POA: Diagnosis present

## 2014-09-09 DIAGNOSIS — I509 Heart failure, unspecified: Secondary | ICD-10-CM

## 2014-09-09 DIAGNOSIS — R1084 Generalized abdominal pain: Secondary | ICD-10-CM

## 2014-09-09 DIAGNOSIS — Z9114 Patient's other noncompliance with medication regimen: Secondary | ICD-10-CM | POA: Diagnosis present

## 2014-09-09 DIAGNOSIS — R9430 Abnormal result of cardiovascular function study, unspecified: Secondary | ICD-10-CM | POA: Diagnosis present

## 2014-09-09 DIAGNOSIS — R0602 Shortness of breath: Secondary | ICD-10-CM | POA: Diagnosis not present

## 2014-09-09 DIAGNOSIS — M109 Gout, unspecified: Secondary | ICD-10-CM | POA: Diagnosis present

## 2014-09-09 DIAGNOSIS — I429 Cardiomyopathy, unspecified: Secondary | ICD-10-CM | POA: Diagnosis present

## 2014-09-09 HISTORY — DX: Acute kidney failure, unspecified: N17.9

## 2014-09-09 HISTORY — DX: Obesity, class 3: E66.813

## 2014-09-09 HISTORY — DX: Hypokalemia: E87.6

## 2014-09-09 HISTORY — DX: Gout, unspecified: M10.9

## 2014-09-09 HISTORY — DX: Sleep apnea, unspecified: G47.30

## 2014-09-09 HISTORY — DX: Morbid (severe) obesity due to excess calories: E66.01

## 2014-09-09 LAB — COMPREHENSIVE METABOLIC PANEL
ALBUMIN: 4.3 g/dL (ref 3.5–5.0)
ALK PHOS: 81 U/L (ref 38–126)
ALT: 56 U/L (ref 17–63)
ANION GAP: 11 (ref 5–15)
AST: 43 U/L — ABNORMAL HIGH (ref 15–41)
BUN: 18 mg/dL (ref 6–20)
CALCIUM: 8.8 mg/dL — AB (ref 8.9–10.3)
CO2: 28 mmol/L (ref 22–32)
Chloride: 100 mmol/L — ABNORMAL LOW (ref 101–111)
Creatinine, Ser: 1.38 mg/dL — ABNORMAL HIGH (ref 0.61–1.24)
GFR calc Af Amer: >60 mL/min (ref >60)
GFR calc non Af Amer: >60 mL/min (ref >60)
GLUCOSE: 133 mg/dL — AB (ref 65–99)
POTASSIUM: 3.4 mmol/L — AB (ref 3.5–5.1)
SODIUM: 139 mmol/L (ref 135–145)
Total Bilirubin: 0.7 mg/dL (ref 0.3–1.2)
Total Protein: 7.6 g/dL (ref 6.5–8.1)

## 2014-09-09 LAB — CBC
HEMATOCRIT: 40.5 % (ref 39.0–52.0)
HEMOGLOBIN: 13 g/dL (ref 13.0–17.0)
MCH: 27.3 pg (ref 26.0–34.0)
MCHC: 32.1 g/dL (ref 30.0–36.0)
MCV: 84.9 fL (ref 78.0–100.0)
Platelets: 288 10*3/uL (ref 150–400)
RBC: 4.77 MIL/uL (ref 4.22–5.81)
RDW: 14.3 % (ref 11.5–15.5)
WBC: 5.5 10*3/uL (ref 4.0–10.5)

## 2014-09-09 LAB — BRAIN NATRIURETIC PEPTIDE: B Natriuretic Peptide: 98 pg/mL (ref 0.0–100.0)

## 2014-09-09 LAB — CBC WITH DIFFERENTIAL/PLATELET
BASOS ABS: 0 10*3/uL (ref 0.0–0.1)
BASOS PCT: 0 % (ref 0–1)
EOS ABS: 0.1 10*3/uL (ref 0.0–0.7)
Eosinophils Relative: 1 % (ref 0–5)
HCT: 46.3 % (ref 39.0–52.0)
HEMOGLOBIN: 15 g/dL (ref 13.0–17.0)
Lymphocytes Relative: 30 % (ref 12–46)
Lymphs Abs: 1.7 10*3/uL (ref 0.7–4.0)
MCH: 27.1 pg (ref 26.0–34.0)
MCHC: 32.4 g/dL (ref 30.0–36.0)
MCV: 83.6 fL (ref 78.0–100.0)
MONOS PCT: 7 % (ref 3–12)
Monocytes Absolute: 0.4 10*3/uL (ref 0.1–1.0)
NEUTROS PCT: 62 % (ref 43–77)
Neutro Abs: 3.4 10*3/uL (ref 1.7–7.7)
Platelets: 336 10*3/uL (ref 150–400)
RBC: 5.54 MIL/uL (ref 4.22–5.81)
RDW: 14.6 % (ref 11.5–15.5)
WBC: 5.6 10*3/uL (ref 4.0–10.5)

## 2014-09-09 LAB — TSH: TSH: 1.448 u[IU]/mL (ref 0.350–4.500)

## 2014-09-09 LAB — CREATININE, SERUM: Creatinine, Ser: 1.41 mg/dL — ABNORMAL HIGH (ref 0.61–1.24)

## 2014-09-09 LAB — LIPASE, BLOOD: Lipase: 19 U/L — ABNORMAL LOW (ref 22–51)

## 2014-09-09 LAB — TROPONIN I
TROPONIN I: 0.03 ng/mL (ref <0.031)
TROPONIN I: 0.03 ng/mL (ref <0.031)
Troponin I: 0.06 ng/mL — ABNORMAL HIGH (ref <0.031)

## 2014-09-09 LAB — MRSA PCR SCREENING: MRSA by PCR: NEGATIVE

## 2014-09-09 MED ORDER — NITROGLYCERIN 0.4 MG SL SUBL
0.4000 mg | SUBLINGUAL_TABLET | SUBLINGUAL | Status: DC | PRN
  Administered 2014-09-09 (×3): 0.4 mg via SUBLINGUAL
  Filled 2014-09-09: qty 1

## 2014-09-09 MED ORDER — ASPIRIN 325 MG PO TABS
325.0000 mg | ORAL_TABLET | Freq: Once | ORAL | Status: AC
  Administered 2014-09-09: 325 mg via ORAL
  Filled 2014-09-09: qty 1

## 2014-09-09 MED ORDER — NITROGLYCERIN 0.4 MG SL SUBL
0.4000 mg | SUBLINGUAL_TABLET | SUBLINGUAL | Status: AC | PRN
  Administered 2014-09-09 (×3): 0.4 mg via SUBLINGUAL

## 2014-09-09 MED ORDER — ASPIRIN EC 81 MG PO TBEC
81.0000 mg | DELAYED_RELEASE_TABLET | Freq: Every day | ORAL | Status: DC
  Administered 2014-09-10 – 2014-09-20 (×10): 81 mg via ORAL
  Filled 2014-09-09 (×10): qty 1

## 2014-09-09 MED ORDER — ACETAMINOPHEN 325 MG PO TABS
650.0000 mg | ORAL_TABLET | ORAL | Status: DC | PRN
  Administered 2014-09-09 – 2014-09-15 (×2): 650 mg via ORAL
  Filled 2014-09-09 (×2): qty 2

## 2014-09-09 MED ORDER — ALBUTEROL SULFATE (2.5 MG/3ML) 0.083% IN NEBU: 2.5000 mg | INHALATION_SOLUTION | Freq: Four times a day (QID) | RESPIRATORY_TRACT | Status: DC | PRN

## 2014-09-09 MED ORDER — ENOXAPARIN SODIUM 40 MG/0.4ML ~~LOC~~ SOLN
40.0000 mg | SUBCUTANEOUS | Status: DC
  Administered 2014-09-09 – 2014-09-16 (×8): 40 mg via SUBCUTANEOUS
  Filled 2014-09-09 (×8): qty 0.4

## 2014-09-09 MED ORDER — MORPHINE SULFATE (PF) 4 MG/ML IV SOLN
4.0000 mg | Freq: Once | INTRAVENOUS | Status: AC
  Administered 2014-09-09: 4 mg via INTRAVENOUS
  Filled 2014-09-09: qty 1

## 2014-09-09 MED ORDER — NITROGLYCERIN 0.4 MG SL SUBL
SUBLINGUAL_TABLET | SUBLINGUAL | Status: AC
  Administered 2014-09-09: 0.4 mg via SUBLINGUAL
  Filled 2014-09-09: qty 3

## 2014-09-09 MED ORDER — ONDANSETRON HCL 4 MG/2ML IJ SOLN: 4.0000 mg | Freq: Four times a day (QID) | INTRAMUSCULAR | Status: DC | PRN

## 2014-09-09 MED ORDER — BUDESONIDE 0.25 MG/2ML IN SUSP
0.2500 mg | Freq: Two times a day (BID) | RESPIRATORY_TRACT | Status: DC
  Administered 2014-09-09 – 2014-09-20 (×17): 0.25 mg via RESPIRATORY_TRACT
  Filled 2014-09-09 (×22): qty 2

## 2014-09-09 MED ORDER — FUROSEMIDE 10 MG/ML IJ SOLN
40.0000 mg | Freq: Two times a day (BID) | INTRAMUSCULAR | Status: DC
  Administered 2014-09-09 – 2014-09-13 (×7): 40 mg via INTRAVENOUS
  Filled 2014-09-09 (×7): qty 4

## 2014-09-09 MED ORDER — LISINOPRIL 5 MG PO TABS
5.0000 mg | ORAL_TABLET | Freq: Every day | ORAL | Status: DC
  Administered 2014-09-09 – 2014-09-10 (×2): 5 mg via ORAL
  Filled 2014-09-09 (×2): qty 1

## 2014-09-09 MED ORDER — ALBUTEROL SULFATE HFA 108 (90 BASE) MCG/ACT IN AERS: 2.0000 | INHALATION_SPRAY | Freq: Four times a day (QID) | RESPIRATORY_TRACT | Status: DC | PRN

## 2014-09-09 NOTE — ED Provider Notes (Signed)
CSN: [id number]     Arrival date & time 09/09/14  0919 History   First MD Initiated Contact with Patient 09/09/14 [phone]     Chief Complaint  Patient presents with  . Chest Pain     (Consider location/radiation/quality/duration/timing/severity/associated sxs/prior Treatment) HPI Noah Rodriguez is a 39 y.o. male with history of hypertension and asthma, comes in for evaluation of epigastric pain. Patient states that he has had cramping in his stomach intermittently for the past one week. The discomfort does not radiate. He denies fevers, chills, nausea or vomiting, diarrhea or constipation, bloody or dark stools, back pain. He reports his abdominal cramping is worse with coughing. Patient reports he has had a cough, intermittent shortness of breath "for the past 5 or 6 months". He reports he is followed by his primary medical doctor for these problems. He presents today because on Sunday he "felt really bad, just out of it". He cannot further clarify, but specifically denies overt chest pain or diaphoresis. He reports his discomfort is nonexertional, but he does find himself becoming more short of breath with exertion.  Past Medical History  Diagnosis Date  . Hypertension   . Asthma    History reviewed. No pertinent past surgical history. Family History  Problem Relation Age of Onset  . HIV Mother   . Hypertension Other   . Alcohol abuse Neg Hx   . Cancer Neg Hx   . Early death Neg Hx   . Heart disease Neg Hx   . Hyperlipidemia Neg Hx   . Kidney disease Neg Hx   . Stroke Neg Hx    Social History  Substance Use Topics  . Smoking status: Never Smoker   . Smokeless tobacco: Never Used  . Alcohol Use: No    Review of Systems A 10 point review of systems was completed and was negative except for pertinent positives and negatives as mentioned in the history of present illness     Allergies  Review of patient's allergies indicates no known allergies.  Home Medications    Prior to Admission medications   Medication Sig Start Date End Date Taking? Authorizing Provider  albuterol (PROVENTIL HFA;VENTOLIN HFA) 108 (90 BASE) MCG/ACT inhaler Inhale 2 puffs into the lungs every 6 (six) hours as needed for wheezing or shortness of breath. 01/16/14   Etta Grandchild, MD  beclomethasone (QVAR) 40 MCG/ACT inhaler Inhale 2 puffs into the lungs 2 (two) times daily. 01/16/14   Etta Grandchild, MD  cyclobenzaprine (FLEXERIL) 10 MG tablet Take 1 tablet (10 mg total) by mouth 2 (two) times daily as needed for muscle spasms. 07/18/14   Vanetta Mulders, MD  HYDROcodone-acetaminophen (NORCO/VICODIN) 5-325 MG per tablet Take 1-2 tablets by mouth every 6 (six) hours as needed for moderate pain. 07/18/14   Vanetta Mulders, MD  naproxen (NAPROSYN) 500 MG tablet Take 1 tablet (500 mg total) by mouth 2 (two) times daily. 07/18/14   Vanetta Mulders, MD  olmesartan-hydrochlorothiazide (BENICAR HCT) 40-12.5 MG per tablet Take 1 tablet by mouth daily. 06/11/14   Etta Grandchild, MD   BP 181/120 mmHg  Pulse 88  Temp(Src) 98.1 F (36.7 C) (Oral)  Resp 18  Ht 5' 7.5" (1.715 m)  Wt 320 lb 3 oz (145.236 kg)  BMI 49.38 kg/m2  SpO2 94% Physical Exam  Constitutional: He is oriented to person, place, and time. He appears well-developed and well-nourished.  Obese  HENT:  Head: Normocephalic and atraumatic.  Mouth/Throat: Oropharynx is clear and moist.  Eyes: Conjunctivae are normal. Pupils are equal, round, and reactive to light. Right eye exhibits no discharge. Left eye exhibits no discharge. No scleral icterus.  Neck: Neck supple.  Cardiovascular: Normal rate, regular rhythm and normal heart sounds.   Pulmonary/Chest: Effort normal and breath sounds normal. No respiratory distress. He has no wheezes. He has no rales.  Abdominal: Soft. There is no tenderness.  Musculoskeletal: Normal range of motion. He exhibits edema. He exhibits no tenderness.  Bilateral lower intermittent edema, patient states is  baseline for him.  Neurological: He is alert and oriented to person, place, and time.  Cranial Nerves II-XII grossly intact  Skin: Skin is warm and dry. No rash noted.  Psychiatric: He has a normal mood and affect.  Nursing note and vitals reviewed.   ED Course  Procedures (including critical care time) Labs Review Labs Reviewed  COMPREHENSIVE METABOLIC PANEL - Abnormal; Notable for the following:    Potassium 3.4 (*)    Chloride 100 (*)    Glucose, Bld 133 (*)    Creatinine, Ser 1.38 (*)    Calcium 8.8 (*)    AST 43 (*)    All other components within normal limits  TROPONIN I - Abnormal; Notable for the following:    Troponin I 0.06 (*)    All other components within normal limits  CBC WITH DIFFERENTIAL/PLATELET  BRAIN NATRIURETIC PEPTIDE    Imaging Review Dg Chest 2 View  09/09/2014   CLINICAL DATA:  Mid chest and upper abdominal cramping for 1 week. Shortness of breath and nonproductive cough.  EXAM: CHEST  2 VIEW  COMPARISON:  PA and lateral chest 01/16/2014 and 11/29/2012.  FINDINGS: There is cardiomegaly and pulmonary vascular congestion. No consolidative process, pneumothorax or effusion is identified. No focal bony abnormality is seen.  IMPRESSION: No acute disease.  Cardiomegaly and pulmonary vascular congestion.   Electronically Signed   By: Drusilla Kanner M.D.   On: 09/09/2014 09:58   I have personally reviewed and evaluated these images and lab results as part of my medical decision-making.   EKG Interpretation   Date/Time:  Monday September 09 2014 09:35:05 EDT Ventricular Rate:  91 PR Interval:  178 QRS Duration: 122 QT Interval:  386 QTC Calculation: 474 R Axis:   24 Text Interpretation:  Normal sinus rhythm Biatrial enlargement  Non-specific intra-ventricular conduction delay T wave abnormality,  consider inferolateral ischemia Abnormal ECG changed from prior EKG  Confirmed by BELFI  MD, MELANIE ([id number]) on 09/09/2014 11:07:34 AM     Meds given in  ED:  Medications  aspirin tablet 325 mg (325 mg Oral Given 09/09/14 1058)  nitroGLYCERIN (NITROSTAT) SL tablet 0.4 mg (0.4 mg Sublingual Given 09/09/14 1112)  morphine 4 MG/ML injection 4 mg (4 mg Intravenous Given 09/09/14 1130)    New Prescriptions   No medications on file   Filed Vitals:   09/09/14 1059 09/09/14 1105 09/09/14 1110 09/09/14 1117  BP: 214/120 200/89 170/99 181/120  Pulse: 86 79 75 88  Temp:      TempSrc:      Resp: Height:      Weight:      SpO2: 94% 94% 95% 94%    MDM  Patient with history of asthma, hypertension, questionable CHF, noncompliant on medications presents with one-week history of abdominal cramping and epigastric discomfort. Also reports five-month history of leg swelling, cough, dyspnea on exertion. Complains of associated epigastric and chest discomfort when he coughs. Evaluation in the  ED shows elevated troponin at 0.06, EKG changes with T-wave inversions inferior and laterally, chest x-ray shows cardiomegaly and pulmonary vascular congestion. Patient given 325 aspirin, 3 sublingual nitroglycerin, rates chest pain at 5/10. Also given dose of morphine x2. Vital signs are stable with hypertension. Patient will need medical admission for further evaluation and management of NSTEMI Discussed with cardiology, Dr. Delton See, patient admitted to stepdown bed at [facility]. Also discussed with my attending, Dr. Fredderick Phenix who also saw pt and agrees with above plan. Final diagnoses:  Elevated troponin I level       Joycie Peek, PA-C 09/09/14 1347  Rolan Bucco, MD 09/09/14 1349

## 2014-09-09 NOTE — ED Notes (Signed)
Pt placed on O2 by [facility] and pt is leaving ER at this time.

## 2014-09-09 NOTE — ED Notes (Signed)
Pt reports CP is increasing and rates it a 3/10. PA notified and ordered redose of morphine 4mg  IV.

## 2014-09-09 NOTE — ED Notes (Signed)
Pt c/o mid chest and upper abd cramping x1 week. Pt sts he also has SOB and non-productive cough. Pt sts pain and SOB are worse with exertion.

## 2014-09-09 NOTE — H&P (Signed)
Patient ID: Noah Rodriguez MRN: [id number], DOB/AGE: 01-13-1975   Admit date: 09/09/2014   Primary Physician: Sanda Linger, MD Primary Cardiologist: New HPI:   Noah Rodriguez is a 39 y.o. male with a history of HTN, Asthma, chronic LE edema and non-compliant to medications who presented to [facility] 09/09/14 for evaluation of worsening sob and epigastric/substernal chest pain and directly admitted to [facility] for mild elevated troponin.   Patient had a sleep study done in 05/2010 for hypersomnia with sleep apnea and recommended CPAP. However, he never get CPAP machine due to cost. Echo 12/2012 showed LV Ef of 35-40%, Moderate LVH, no WM abnormality, grade 1 DD, mild dilated RV.  Patient reports he has had a cough, intermittent shortness of breath for the past 4 or 5 months. He reports that he is followed by his primary medical doctor for these problems. Felt ongoing symptoms due to non-compliance. For the past 2-3 weeks he is complaining of intermitted epigastric pain, describes as dull, resolves by itself in few minutes. Nothing makes worse or better. He is also a poor historian, unable to provide specific details, said "I just don't feel well for the past 2-3 weeks". This morning after bowel movement he had epigastric/substernal chest pain. He descries as dull and achy and presented for further evaluation. He admits to having chronic LE edema and SOB, that has been worsen recently. He dose have orthopnea. Denies PND, diarrhea, nausea, vomiting or melena. No hx of drug abuse or tobacco smoking. Grandfather has hx of pancreatic cancer and HF.   In Ed,  troponin of 0.06, EKG changes with T-wave inversions inferior and laterally, chest x-ray shows cardiomegaly and pulmonary vascular congestion, BNP of 98.  K of 3.4, Cr of 1.38, AST of 43. He was given 325 aspirin, 3 sublingual nitroglycerin for chest pain at 5/10 (now resolved) and morphine x2. BP of 203/120, now improved to  158/86.  Problem List  Past Medical History  Diagnosis Date  . Hypertension   . Asthma     History reviewed. No pertinent past surgical history.   Allergies  No Known Allergies   Home Medications  Prior to Admission medications   Medication Sig Start Date End Date Taking? Authorizing Provider  albuterol (PROVENTIL HFA;VENTOLIN HFA) 108 (90 BASE) MCG/ACT inhaler Inhale 2 puffs into the lungs every 6 (six) hours as needed for wheezing or shortness of breath. 01/16/14   Etta Grandchild, MD  beclomethasone (QVAR) 40 MCG/ACT inhaler Inhale 2 puffs into the lungs 2 (two) times daily. 01/16/14   Etta Grandchild, MD  cyclobenzaprine (FLEXERIL) 10 MG tablet Take 1 tablet (10 mg total) by mouth 2 (two) times daily as needed for muscle spasms. 07/18/14   Vanetta Mulders, MD  HYDROcodone-acetaminophen (NORCO/VICODIN) 5-325 MG per tablet Take 1-2 tablets by mouth every 6 (six) hours as needed for moderate pain. 07/18/14   Vanetta Mulders, MD  naproxen (NAPROSYN) 500 MG tablet Take 1 tablet (500 mg total) by mouth 2 (two) times daily. 07/18/14   Vanetta Mulders, MD  olmesartan-hydrochlorothiazide (BENICAR HCT) 40-12.5 MG per tablet Take 1 tablet by mouth daily. 06/11/14   Etta Grandchild, MD    Family History  Family History  Problem Relation Age of Onset  . HIV Mother   . Hypertension Other   . Alcohol abuse Neg Hx   . Cancer Neg Hx   . Early death Neg Hx   . Heart disease Neg Hx   . Hyperlipidemia  Neg Hx   . Kidney disease Neg Hx   . Stroke Neg Hx    No family status information on file.     Social History  Social History   Social History  . Marital Status: Married    Spouse Name: N/A  . Number of Children: N/A  . Years of Education: N/A   Occupational History  . Not on file.   Social History Main Topics  . Smoking status: Never Smoker   . Smokeless tobacco: Never Used  . Alcohol Use: No  . Drug Use: No  . Sexual Activity: Yes    Birth Control/ Protection: Condom   Other  Topics Concern  . Not on file   Social History Narrative     Review of Systems General:  No chills, fever, night sweats or weight changes.  Cardiovascular:  No chest pain, dyspnea on exertion, edema, orthopnea, palpitations, paroxysmal nocturnal dyspnea. Dermatological: No rash, lesions/masses Respiratory: No cough, dyspnea Urologic: No hematuria, dysuria Abdominal:   No nausea, vomiting, diarrhea, bright red blood per rectum, melena, or hematemesis Neurologic:  No visual changes, wkns, changes in mental status. All other systems reviewed and are otherwise negative except as noted above.  Physical Exam  Blood pressure 152/86, pulse 74, temperature 99.1 F (37.3 C), temperature source Oral, resp. rate 23, height 5' 7.5" (1.715 m), weight 315 lb 0.6 oz (142.9 kg), SpO2 98 %.  General: Pleasant, obese male in NAD Psych: Normal affect. Neuro: Alert and oriented X 3. Moves all extremities spontaneously. HEENT: Normal  Neck: Supple without bruits.  JVD difficult to assess due to grith.  Lungs:  Resp regular and unlabored, CTA. Heart: RRR no s3, s4, or murmurs. Abdomen: Soft, diffuse tender to palpation and mildly distended BS + x 4.  Extremities: No clubbing, cyanosis. DP/PT/Radials 2+ and equal bilaterally. 1-2+ BL LE edema.   Labs   Recent Labs  09/09/14 0940  TROPONINI 0.06*   Lab Results  Component Value Date   WBC 5.6 09/09/2014   HGB 15.0 09/09/2014   HCT 46.3 09/09/2014   MCV 83.6 09/09/2014   PLT 336 09/09/2014    Recent Labs Lab 09/09/14 0940  NA 139  K 3.4*  CL 100*  CO2 28  BUN 18  CREATININE 1.38*  CALCIUM 8.8*  PROT 7.6  BILITOT 0.7  ALKPHOS 81  ALT 56  AST 43*  GLUCOSE 133*    Radiology/Studies  Dg Chest 2 View  09/09/2014   CLINICAL DATA:  Mid chest and upper abdominal cramping for 1 week. Shortness of breath and nonproductive cough.  EXAM: CHEST  2 VIEW  COMPARISON:  PA and lateral chest 01/16/2014 and 11/29/2012.  FINDINGS: There is  cardiomegaly and pulmonary vascular congestion. No consolidative process, pneumothorax or effusion is identified. No focal bony abnormality is seen.  IMPRESSION: No acute disease.  Cardiomegaly and pulmonary vascular congestion.   Electronically Signed   By: Drusilla Kanner M.D.   On: 09/09/2014 09:58    ECG Vent. rate 91 BPM PR interval 178 ms QRS duration 122 ms QT/QTc 386/474 ms P-R-T axes 43 24 -13  Echo 12/2012 LV EF: 35% -  40%  ------------------------------------------------------------ Indications:   Hypertension - resistant 401.9. Shortness of breath 786.05.  ------------------------------------------------------------ History:  PMH: Acquired from the patient and from the patient's chart. Dyspnea and cough. Cardiomegaly. Essential hypertension. Asthma. Risk factors: Morbidly obese.  ------------------------------------------------------------ Study Conclusions  - Left ventricle: The cavity size was mildly dilated. There was moderate concentric hypertrophy.  Systolic function was moderately reduced. The estimated ejection fraction was in the range of 35% to 40%. Wall motion was normal; there were no regional wall motion abnormalities. Doppler parameters are consistent with abnormal left ventricular relaxation (grade 1 diastolic dysfunction). - Right ventricle: The cavity size was mildly dilated. Wall thickness was normal. - Atrial septum: No defect or patent foramen ovale was identified.  ASSESSMENT AND PLAN 1. Elevated troponin with atypical chest pain - Troponin of 0.06 with TWI in inferior lateral leads, appears same when compared to last ekg 05/26/2012.  - Patient has atypical chest pain, nitro and morphine responsive.  - Will get cycle troponin, TSH, lipid - Will get echo, if abnormal will proceed with ischemic eval.   2. Hypertensive heart disease/Cardiomegaly - CXR showed cardiomegaly with pulmonary vascular congestion. He dose  have chronic bilateral LE edema. - Echo 12/2012 showed LV Ef of 35-40%, Moderate LVH, no WM abnormality, grade 1 DD, mild dilated RV. - Will get repeat echo - At home Benicar-HCTZ 40-12.5mg  BID. - Will hold HCTZ and start IV lasix BID 40mg  Stick I&O and daily weight. Resume ARB. - will hold adding BB for now, due to hx of asthma and tele with transient sinus bradycardia at rate of mid 40s.   3. Hypokalemia - K of 3.4. Will give supplement.  4. AKI - Creatinine of 1.38. Get daily daily BMET and follow closely.   5. Epigastric pain - With diffuse abdominal pain on palpation - Will get lipase and abdominal CXR - Likely due to vascular congestion.  6. Hypersomnia with sleep apnea  - Patient had a sleep study done in 05/2010 and recommended CPAP. F/u with PCP.  7. Morbid obesity - Body mass index is 48.59 kg/(m^2).  - Encouraged lifestyle modifications  8. Asthma - No active wheezing. Continue home medications.  9. Hyperglycemia - HgbA1c was 6.4 (01/2014), Continue to monitor. F/u with PCP.    Signed, Manson Passey, PA-C 09/09/2014, 2:54 PM Pager [phone]    Patient seen and examined. Agree with assessment and plan. Pt is a 39 yo morbidly obese AAM with a h/o HTN and documented cardiomegaly since 2013. An echo in 2014 showed an EF 35 - 40% with moderate LVH and Grade 1 diastolic dysfunction.  He has been out of his medicines for over 5 months.  He has a history of untreated sleep apnea, very poor dietary habits, eating fried food, bacon and always adding sodium to his food. He is admitted from [facility] with increasing dyspnea, hypertensive and admits to chest and abdominal tenderness. Old ECG's have shown T wave abnormality and ECG today reveals inferolateral T wave inversion; troponin is minimally elevated; Suspect Type B, due to hypertensive heart disease/ congestion.  Will add lasix, resume ARB therapy, f/u echo. He sleeps poorly and ultimately should re-pursue CPAP  therapy. Needs major lifestyle adjustment with diet; weight loss and exercise.   He never smoked.  Consider an ischemic evaluation. With abdominal tenderness will obtain abd x ray, check lipase, amylase.    Lennette Bihari, MD, [facility] 09/09/2014 4:17 PM

## 2014-09-09 NOTE — Progress Notes (Signed)
Pt complaining of chest pain and pressure, 3/10. One nitro given, currently obtaining EKG. Will make MD aware.

## 2014-09-09 NOTE — Progress Notes (Signed)
MD started pt on BP medication and reviewed EKG, no changes. Advised to give tylenol to help with pain.

## 2014-09-10 ENCOUNTER — Ambulatory Visit (HOSPITAL_COMMUNITY): Payer: 59

## 2014-09-10 DIAGNOSIS — I110 Hypertensive heart disease with heart failure: Secondary | ICD-10-CM | POA: Diagnosis not present

## 2014-09-10 DIAGNOSIS — R0600 Dyspnea, unspecified: Secondary | ICD-10-CM

## 2014-09-10 DIAGNOSIS — R7989 Other specified abnormal findings of blood chemistry: Secondary | ICD-10-CM | POA: Diagnosis not present

## 2014-09-10 DIAGNOSIS — I517 Cardiomegaly: Secondary | ICD-10-CM | POA: Diagnosis not present

## 2014-09-10 LAB — LIPID PANEL
CHOL/HDL RATIO: 4.5 ratio
Cholesterol: 141 mg/dL (ref 0–200)
HDL: 31 mg/dL — AB (ref >40)
LDL CALC: 95 mg/dL (ref 0–99)
TRIGLYCERIDES: 77 mg/dL (ref <150)
VLDL: 15 mg/dL (ref 0–40)

## 2014-09-10 LAB — BASIC METABOLIC PANEL
Anion gap: 8 (ref 5–15)
BUN: 20 mg/dL (ref 6–20)
CHLORIDE: 99 mmol/L — AB (ref 101–111)
CO2: 27 mmol/L (ref 22–32)
CREATININE: 1.34 mg/dL — AB (ref 0.61–1.24)
Calcium: 8.5 mg/dL — ABNORMAL LOW (ref 8.9–10.3)
GFR calc non Af Amer: >60 mL/min (ref >60)
Glucose, Bld: 97 mg/dL (ref 65–99)
POTASSIUM: 4.9 mmol/L (ref 3.5–5.1)
Sodium: 134 mmol/L — ABNORMAL LOW (ref 135–145)

## 2014-09-10 LAB — TROPONIN I: Troponin I: 0.03 ng/mL (ref <0.031)

## 2014-09-10 MED ORDER — METOPROLOL TARTRATE 12.5 MG HALF TABLET
12.5000 mg | ORAL_TABLET | Freq: Two times a day (BID) | ORAL | Status: DC
  Administered 2014-09-10 – 2014-09-11 (×3): 12.5 mg via ORAL
  Filled 2014-09-10 (×3): qty 1

## 2014-09-10 MED ORDER — LISINOPRIL 5 MG PO TABS
5.0000 mg | ORAL_TABLET | Freq: Two times a day (BID) | ORAL | Status: DC
  Administered 2014-09-10 – 2014-09-11 (×2): 5 mg via ORAL
  Filled 2014-09-10 (×4): qty 1

## 2014-09-10 NOTE — Care Management Note (Addendum)
Case Management Note  Patient Details  Name: Xzayden Longhurst MRN: [id number] Date of Birth: September 06, 1975  Subjective/Objective:      Pt is employed but has no insurance, needs CPAP but cannot afford same.  Discussed application for CPAP Assistance Program with spouse, she agrees to provide income information and $100 donation required by Program.  [employer] will talk with pt and spouse tomorrow to determine if they qualify for the indigent program for oxygen if pt needs a bleed-in.  Pt will be fitted with CPAP tonight so settings can be determined.  Pt's meds: lasix, lisinopril, and metoprolol are on [facility] generic list and can be obtained for $4 for 30-day supply, $10 for 90-day supply.  Provided pharmaceutical pt assistance program for pt's inhaler.                                   Expected Discharge Plan:  Home/Self Care  In-House Referral:  Financial Counselor  Discharge planning Services  CM Consult  Post Acute Care Choice:  Durable Medical Equipment  DME Arranged:  Continuous passive motion machine, Oxygen DME Agency:  [facility] Other - Comment  Status of Service:  In process, will continue to follow   Magdalene River, RN 09/10/2014, 4:40 PM  09/11/14 10:54 AM   Pt now states he has insurance and insurance information has been entered into system.  Does not recall stating he had no insurance yesterday - "must have been asleep."  Per RT and RN, pt refused to wear CPAP last night.

## 2014-09-10 NOTE — Progress Notes (Addendum)
Placed patient on CPAP for the night.  10cmH20 with 2L 02 bleed in via a large full face mask.  Patient did not tolerate well and asked to take the mask off.  He will ask RN to call me if he decides to try again.

## 2014-09-10 NOTE — Progress Notes (Addendum)
Patient Name: Noah Rodriguez Date of Encounter: 09/10/2014  Principal Problem:   Elevated troponin Active Problems:   Hypertensive heart disease   Mild persistent asthma   Cardiomegaly   Obesity, Class III, BMI 40-49.9 (morbid obesity)   Sleep apnea   AKI (acute kidney injury)   Hypokalemia   Epigastric pain    Primary Cardiologist: New Patient Profile: 39yo male w/ PMH of HTN, Asthma, OSA (not on CPAP)  and chronic LE edema admitted on 09/09/14 for worsening SOB, sternal chest pain, troponin of 0.06, and T-wave inversion on EKG in inferior and lateral leads.  SUBJECTIVE: Patient reports he had one episode of chest pain overnight that he rates as a 3/10. Completely resolved with 1 SL NTG. Reports his chest is tender to palpation right now, but no squeezing or shooting chest pain.  OBJECTIVE Filed Vitals:   09/10/14 0900 09/10/14 1000 09/10/14 1002 09/10/14 1100  BP: 161/92 139/77  146/76  Pulse: 86 87  52  Temp:      TempSrc:      Resp: 14 30  23   Height:      Weight:      SpO2: 100% 97% 97% 97%    Intake/Output Summary (Last 24 hours) at 09/10/14 1216 Last data filed at 09/10/14 1142  Gross per 24 hour  Intake    200 ml  Output   2850 ml  Net  -2650 ml   Filed Weights   09/09/14 0924 09/09/14 1408 09/10/14 0500  Weight: 320 lb 3 oz (145.236 kg) 315 lb 0.6 oz (142.9 kg) 312 lb 13.3 oz (141.9 kg)    PHYSICAL EXAM General: Obese, African American male in no acute distress. Head: Normocephalic, atraumatic.  Neck: Supple without bruits, JVD unable to be assessed due to body habitus. Lungs:  Resp regular and unlabored, CTA without wheezing or rales. Heart: RRR, S1, S2, no S3, S4, or murmur; no rub. Tender to palpation along left pectoral region. Abdomen: Soft, non-tender, non-distended with normoactive bowel sounds. No hepatomegaly. No rebound/guarding. No obvious abdominal masses. Extremities: No clubbing or cyanosis. 1+ edema bilaterally Distal pulses are  2+ bilaterally. Neuro: Alert and oriented X 3. Moves all extremities spontaneously. Psych: Normal affect.   LABS: CBC: Recent Labs  09/09/14 0940 09/09/14 1859  WBC 5.6 5.5  NEUTROABS 3.4  --   HGB 15.0 13.0  HCT 46.3 40.5  MCV 83.6 84.9  PLT 336 288   Basic Metabolic Panel: Recent Labs  09/09/14 0940 09/09/14 1859 09/10/14 0550  NA 139  --  134*  K 3.4*  --  4.9  CL 100*  --  99*  CO2 28  --  27  GLUCOSE 133*  --  97  BUN 18  --  20  CREATININE 1.38* 1.41* 1.34*  CALCIUM 8.8*  --  8.5*   Liver Function Tests: Recent Labs  09/09/14 0940  AST 43*  ALT 56  ALKPHOS 81  BILITOT 0.7  PROT 7.6  ALBUMIN 4.3   Cardiac Enzymes: Recent Labs  09/09/14 1859 09/09/14 2237 09/10/14 0550  TROPONINI 0.03 0.03 0.03   BNP:  B NATRIURETIC PEPTIDE  Date/Time Value Ref Range Status  09/09/2014 09:40 AM 98.0 0.0 - 100.0 pg/mL Final   Fasting Lipid Panel: Recent Labs  09/10/14 0550  CHOL 141  HDL 31*  LDLCALC 95  TRIG 77  CHOLHDL 4.5   Thyroid Function Tests: Recent Labs  09/09/14 1859  TSH 1.448   TELE: NSR with rate  in 60's - 80's. No atopic events.       ECG: NSR w/rate in 60's. Lateral T-wave abnormality noted.  ECHO: Pending  Radiology/Studies: Dg Chest 2 View: 09/09/2014   CLINICAL DATA:  Mid chest and upper abdominal cramping for 1 week. Shortness of breath and nonproductive cough.  EXAM: CHEST  2 VIEW  COMPARISON:  PA and lateral chest 01/16/2014 and 11/29/2012.  FINDINGS: There is cardiomegaly and pulmonary vascular congestion. No consolidative process, pneumothorax or effusion is identified. No focal bony abnormality is seen.  IMPRESSION: No acute disease.  Cardiomegaly and pulmonary vascular congestion.   Electronically Signed   By: Drusilla Kanner M.D.   On: 09/09/2014 09:58   Dg Abd 1 View: 09/09/2014   CLINICAL DATA:  Acute onset severe abdominal pain earlier today.  EXAM: Portable ABDOMEN - 1 VIEW  COMPARISON:  None.  FINDINGS: Bowel gas  pattern unremarkable without evidence of obstruction or significant ileus. Moderate stool burden in the colon. No visible opaque urinary tract calculi, though the examination is less than optimal using portable technique, given the patient's body habitus.  IMPRESSION: No acute abdominal abnormality.   Electronically Signed   By: Hulan Saas M.D.   On: 09/09/2014 19:39     Current Medications:  . aspirin EC  81 mg Oral Daily  . budesonide (PULMICORT) nebulizer solution  0.25 mg Nebulization BID  . enoxaparin (LOVENOX) injection  40 mg Subcutaneous Q24H  . furosemide  40 mg Intravenous BID  . lisinopril  5 mg Oral Daily      ASSESSMENT AND PLAN:  1. Elevated troponin in setting of atypical chest pain - Troponin in ED elevated to 0.06. Cyclic troponins have been <0.03. - TSH normal range at 1.448. LDL at 95. - awaiting echocardiogram. If abnormal, will need to consider ischemic evaluation. - continue ASA, ACE-I. Not adding BB for now due to diagnosis of asthma. - LDL 95, but HDL decreased at 31. Consider adding statin if financially feasible for patient.    2. Hypertensive heart disease - echo in 12/2012 showed EF if 35-40%, moderate LVH. Awaiting repeat Echo. - Net -2.6L since admission. - Continue Lasix  IV BID and Lisinopril  daily.  3. Mild persistent asthma - continue home medical therapy  4. Hypokalemia   - currently resolved. 4.9 on 09/10/2014.  5. Obesity, Class III, BMI 40-49.9 (morbid obesity) - encourage lifestyle modifications  6. Sleep apnea - Sleep study conducted in 05/2010 and patient was recommended CPAP at that time, but could not financially afford it - Will consult case management to see if there is anyway to obtain CPAP machine in setting of financial issues.  7. AKI (acute kidney injury) - Creatinine elevated to 1.38 on admission. 1.34 on 09/10/14. - monitor daily BMET.    8. Epigastric pain - CXR without any acute abnormalities - reports  improvement in his epigastric pain.    Alexis Frock , PA-C 12:16 PM 09/10/2014 Pager: [phone]    Patient seen and examined. Agree with assessment and plan. Echo being done. Bedside preliminary review shows LVH with reduced EF ~30 -35 %. Will titrate lisinopril to 5 mg bid today, and possible titrate to daily increased dosing as BP allow.  Consider low dose cardioselctive BB with low dose metoprolol or bisoprolol. Will try to use low cost meds. Ultimately will need an ischemic evaluation and if nuclear study done probably needs 2 day protocol. I patient cannot obtain, consider alternatives with possible customized oral appliance.  Lennette Bihari, MD, [facility] 09/10/2014 12:54 PM

## 2014-09-10 NOTE — Progress Notes (Signed)
  Echocardiogram 2D Echocardiogram has been performed.  Sheralyn Boatman R 09/10/2014, 1:28 PM

## 2014-09-11 DIAGNOSIS — R072 Precordial pain: Secondary | ICD-10-CM | POA: Diagnosis not present

## 2014-09-11 DIAGNOSIS — M109 Gout, unspecified: Secondary | ICD-10-CM | POA: Diagnosis present

## 2014-09-11 DIAGNOSIS — I428 Other cardiomyopathies: Secondary | ICD-10-CM

## 2014-09-11 DIAGNOSIS — Z9119 Patient's noncompliance with other medical treatment and regimen: Secondary | ICD-10-CM | POA: Diagnosis present

## 2014-09-11 DIAGNOSIS — R9430 Abnormal result of cardiovascular function study, unspecified: Secondary | ICD-10-CM | POA: Diagnosis not present

## 2014-09-11 DIAGNOSIS — R7989 Other specified abnormal findings of blood chemistry: Secondary | ICD-10-CM | POA: Diagnosis not present

## 2014-09-11 DIAGNOSIS — R739 Hyperglycemia, unspecified: Secondary | ICD-10-CM | POA: Diagnosis present

## 2014-09-11 DIAGNOSIS — Z9114 Patient's other noncompliance with medication regimen: Secondary | ICD-10-CM | POA: Diagnosis present

## 2014-09-11 DIAGNOSIS — N179 Acute kidney failure, unspecified: Secondary | ICD-10-CM | POA: Diagnosis present

## 2014-09-11 DIAGNOSIS — Z6841 Body Mass Index (BMI) 40.0 and over, adult: Secondary | ICD-10-CM | POA: Diagnosis not present

## 2014-09-11 DIAGNOSIS — R079 Chest pain, unspecified: Secondary | ICD-10-CM | POA: Diagnosis not present

## 2014-09-11 DIAGNOSIS — I517 Cardiomegaly: Secondary | ICD-10-CM | POA: Diagnosis not present

## 2014-09-11 DIAGNOSIS — G4733 Obstructive sleep apnea (adult) (pediatric): Secondary | ICD-10-CM | POA: Diagnosis present

## 2014-09-11 DIAGNOSIS — J4530 Mild persistent asthma, uncomplicated: Secondary | ICD-10-CM | POA: Diagnosis present

## 2014-09-11 DIAGNOSIS — I119 Hypertensive heart disease without heart failure: Secondary | ICD-10-CM | POA: Diagnosis not present

## 2014-09-11 DIAGNOSIS — I252 Old myocardial infarction: Secondary | ICD-10-CM | POA: Diagnosis not present

## 2014-09-11 DIAGNOSIS — I5043 Acute on chronic combined systolic (congestive) and diastolic (congestive) heart failure: Secondary | ICD-10-CM | POA: Diagnosis present

## 2014-09-11 DIAGNOSIS — I110 Hypertensive heart disease with heart failure: Secondary | ICD-10-CM | POA: Diagnosis not present

## 2014-09-11 DIAGNOSIS — Z791 Long term (current) use of non-steroidal anti-inflammatories (NSAID): Secondary | ICD-10-CM | POA: Diagnosis not present

## 2014-09-11 DIAGNOSIS — G4710 Hypersomnia, unspecified: Secondary | ICD-10-CM | POA: Diagnosis present

## 2014-09-11 DIAGNOSIS — I272 Other secondary pulmonary hypertension: Secondary | ICD-10-CM | POA: Diagnosis present

## 2014-09-11 DIAGNOSIS — I420 Dilated cardiomyopathy: Secondary | ICD-10-CM | POA: Diagnosis present

## 2014-09-11 DIAGNOSIS — I429 Cardiomyopathy, unspecified: Secondary | ICD-10-CM | POA: Diagnosis not present

## 2014-09-11 DIAGNOSIS — I509 Heart failure, unspecified: Secondary | ICD-10-CM | POA: Diagnosis not present

## 2014-09-11 DIAGNOSIS — R0602 Shortness of breath: Secondary | ICD-10-CM | POA: Diagnosis present

## 2014-09-11 DIAGNOSIS — E876 Hypokalemia: Secondary | ICD-10-CM | POA: Diagnosis present

## 2014-09-11 DIAGNOSIS — R1013 Epigastric pain: Secondary | ICD-10-CM | POA: Diagnosis present

## 2014-09-11 LAB — BASIC METABOLIC PANEL
Anion gap: 8 (ref 5–15)
BUN: 17 mg/dL (ref 6–20)
CHLORIDE: 102 mmol/L (ref 101–111)
CO2: 28 mmol/L (ref 22–32)
Calcium: 9.1 mg/dL (ref 8.9–10.3)
Creatinine, Ser: 1.35 mg/dL — ABNORMAL HIGH (ref 0.61–1.24)
GFR calc Af Amer: >60 mL/min (ref >60)
GFR calc non Af Amer: >60 mL/min (ref >60)
Glucose, Bld: 93 mg/dL (ref 65–99)
POTASSIUM: 4 mmol/L (ref 3.5–5.1)
SODIUM: 138 mmol/L (ref 135–145)

## 2014-09-11 MED ORDER — LISINOPRIL 20 MG PO TABS
10.0000 mg | ORAL_TABLET | Freq: Two times a day (BID) | ORAL | Status: DC
  Administered 2014-09-11 – 2014-09-12 (×2): 10 mg via ORAL
  Filled 2014-09-11: qty 1

## 2014-09-11 MED ORDER — REGADENOSON 0.4 MG/5ML IV SOLN
0.4000 mg | Freq: Once | INTRAVENOUS | Status: AC
  Administered 2014-09-12: 0.4 mg via INTRAVENOUS
  Filled 2014-09-11: qty 5

## 2014-09-11 NOTE — Progress Notes (Signed)
Patient Name: Noah Rodriguez Date of Encounter: 09/11/2014     Principal Problem:   Acute on chronic combined systolic and diastolic CHF, NYHA class 3 Active Problems:   Hypertensive heart disease   Mild persistent asthma   Cardiomegaly   Obesity, Class III, BMI 40-49.9 (morbid obesity)   Sleep apnea   AKI (acute kidney injury)   Hypokalemia   Elevated troponin   Epigastric pain   NICM (nonischemic cardiomyopathy)    SUBJECTIVE  No further chest pain.  Breathing stable @ rest but continues to note orthopnea.  CURRENT MEDS . aspirin EC  81 mg Oral Daily  . budesonide (PULMICORT) nebulizer solution  0.25 mg Nebulization BID  . enoxaparin (LOVENOX) injection  40 mg Subcutaneous Q24H  . furosemide  40 mg Intravenous BID  . lisinopril  10 mg Oral BID  . metoprolol tartrate  12.5 mg Oral BID  . [START ON 09/12/2014] regadenoson  0.4 mg Intravenous Once    OBJECTIVE  Filed Vitals:   09/11/14 0834 09/11/14 1008 09/11/14 1200 09/11/14 1256  BP: 156/104 158/103 165/93   Pulse:  78 57   Temp:    97.9 F (36.6 C)  TempSrc:    Oral  Resp:   19   Height:      Weight:      SpO2:   96%     Intake/Output Summary (Last 24 hours) at 09/11/14 1339 Last data filed at 09/11/14 1009  Gross per 24 hour  Intake      0 ml  Output   3150 ml  Net  -3150 ml   Filed Weights   09/09/14 1408 09/10/14 0500 09/11/14 0700  Weight: 315 lb 0.6 oz (142.9 kg) 312 lb 13.3 oz (141.9 kg) 312 lb 13.3 oz (141.9 kg)    PHYSICAL EXAM  General: Pleasant, NAD. Neuro: Alert and oriented X 3. Moves all extremities spontaneously. Psych: Normal affect. HEENT:  Normal  Neck: Supple without bruits.  Difficult to gauge JVP 2/2 girth. Lungs:  Resp regular and unlabored, CTA. Heart: RRR no s3, s4, or murmurs. Abdomen: Soft, non-tender, non-distended, BS + x 4.  Extremities: No clubbing, cyanosis or edema. DP/PT/Radials 2+ and equal bilaterally.  Accessory Clinical Findings  CBC  Recent  Labs  09/09/14 0940 09/09/14 1859  WBC 5.6 5.5  NEUTROABS 3.4  --   HGB 15.0 13.0  HCT 46.3 40.5  MCV 83.6 84.9  PLT 336 288   Basic Metabolic Panel  Recent Labs  09/10/14 0550 09/11/14 1219  NA 134* 138  K 4.9 4.0  CL 99* 102  CO2 27 28  GLUCOSE 97 93  BUN 20 17  CREATININE 1.34* 1.35*  CALCIUM 8.5* 9.1   Liver Function Tests  Recent Labs  09/09/14 0940  AST 43*  ALT 56  ALKPHOS 81  BILITOT 0.7  PROT 7.6  ALBUMIN 4.3    Recent Labs  09/09/14 1859  LIPASE 19*   Cardiac Enzymes  Recent Labs  09/09/14 1859 09/09/14 2237 09/10/14 0550  TROPONINI 0.03 0.03 0.03   Fasting Lipid Panel  Recent Labs  09/10/14 0550  CHOL 141  HDL 31*  LDLCALC 95  TRIG 77  CHOLHDL 4.5   Thyroid Function Tests  Recent Labs  09/09/14 1859  TSH 1.448   TELE  SB/RSR  2D Echocardiogram 8.30.2016  Study Conclusions  - Left ventricle: False tendon in LV apex of no clinical   significance. The cavity size was normal. There was moderate  concentric hypertrophy. Systolic function was moderately to   severely reduced. The estimated ejection fraction was in the   range of 30% to 35%. Diffuse hypokinesis. There was an increased   relative contribution of atrial contraction to ventricular   filling. Doppler parameters are consistent with abnormal left   ventricular relaxation (grade 1 diastolic dysfunction). - Aorta: Aortic root dimension: 40 mm (ED). - Aortic root: The aortic root was mildly dilated. - Mitral valve: There was trivial regurgitation. - Pericardium, extracardiac: A trivial pericardial effusion was   identified posterior to the heart. _____________  Radiology/Studies  Dg Chest 2 View  09/09/2014   CLINICAL DATA:  Mid chest and upper abdominal cramping for 1 week. Shortness of breath and nonproductive cough.  EXAM: CHEST  2 VIEW  COMPARISON:  PA and lateral chest 01/16/2014 and 11/29/2012.  FINDINGS: There is cardiomegaly and pulmonary vascular  congestion. No consolidative process, pneumothorax or effusion is identified. No focal bony abnormality is seen.  IMPRESSION: No acute disease.  Cardiomegaly and pulmonary vascular congestion.   Electronically Signed   By: Drusilla Kanner M.D.   On: 09/09/2014 09:58   Dg Abd 1 View  09/09/2014   CLINICAL DATA:  Acute onset severe abdominal pain earlier today.  EXAM: Portable ABDOMEN - 1 VIEW  COMPARISON:  None.  FINDINGS: Bowel gas pattern unremarkable without evidence of obstruction or significant ileus. Moderate stool burden in the colon. No visible opaque urinary tract calculi, though the examination is less than optimal using portable technique, given the patient's body habitus.  IMPRESSION: No acute abdominal abnormality.   Electronically Signed   By: Hulan Saas M.D.   On: 09/09/2014 19:39    ASSESSMENT AND PLAN  1. Elevated troponin in setting of atypical chest and epigastric pain - Troponin in ED elevated to 0.06. Cyclic troponins have been <0.03. - No further c/p. - Echo yesterday w/ persistent LV dysfxn. - Continue ASA, ACE-I, B1 selective BB. - Plan 2 day myoview.  Pt prefers to do as inpt.  Will schedule for tomorrow.  2. Acute on chronic combined systolic and diastolic CHF - EF 30-35% w/ diff HK, Gr 1 DD by echo yesterday - relatively stable since 2014. - Net -6.1L since admission. - Wt down 8 lbs since admission (bed scale).  This is the lowest he's been since January. - Volume looks pretty good on exam though exam complicated by body habitus.  He cont to c/o orthopnea. -  F/U bmet this AM. - Continue Lasix 40mg  IV BID - can likely switch to PO tomorrow. -  Cont and titrate Lisinopril to 10mg  BID as bp remains elevated. - Low dose bb added yesterday.  Tolerating w/o wheezing.  Will plan to consolidate to toprol xl prior to d/c. - We discussed the importance of daily weights, sodium restriction, medication compliance, and symptom reporting and he verbalizes understanding.  Per his significant other, he eats a steady diet of fast food and adds salt to everything.  3. Mild persistent asthma - No active wheezing. - Continue home medical therapy. - Tolerating oral BB.  4. Hypokalemia   - K 4.9 on 09/10/2014. - F/U this AM.  5. Obesity, Class III, BMI 40-49.9 (morbid obesity) - Encourage lifestyle modifications.  6. Sleep apnea - Sleep study conducted in 05/2010 and patient was recommended CPAP at that time, but could not financially afford it.  CM consulted yesterday and arrangements for assistance made however pt has [facility] insurance and thus does not qualify for  assistance.  Further, he refused to use CPAP last night but says that that was b/c he felt like he was smothering.  Will need outpt f/u in sleep clinic (? Dr. Tresa Endo) to explore other options.  7. AKI (acute kidney injury) - Creatinine elevated to 1.38 on admission. 1.34 on 09/10/14. Not checked this AM. - monitor daily BMET.  8.  Essential HTN - BP variable but remains elevated overall. - Cont bb.  Titrate lisinopril to 10 mg BID.  9.  Noncompliance - Likely to continue to be a major issue impacting his care.     Signed, Nicolasa Ducking, NP  Patient seen and examined. Agree with assessment and plan. Will further titrate lisinopril. Excellent diuresis. Weight 320 -->312 since admission. For 2 day protocol nuclear stress test.   Lennette Bihari, MD, [facility] 09/11/2014 1:39 PM

## 2014-09-11 NOTE — Progress Notes (Signed)
Pt refused to wear CPAP overnight.  Respiratory Therapist applied the CPAP at HS, but pt said he could not tolerate it.  When RN asked if the CPAP could be tried again he haid he wanted to watch television and would have it placed later.  Pt's O2 sats did fall overnight into the 70-80's and was bradycardic with a rate as low as 39bbm, but sustained 44-48bbm.  RN witnessed frequent apneic events with correlating desat and bradycardia, awoke pt and asked if the mask could be reapplied, he stated that he was not sleeping and did not need the CPAP.  Several attempts were made to encourage and educate the pt about the importance of wearing the CPAP, he was pleasant, but unwilling to wear it.  Spoke to [place] and D. Dunn PA and informed them of these events.

## 2014-09-12 ENCOUNTER — Inpatient Hospital Stay (HOSPITAL_COMMUNITY): Payer: 59

## 2014-09-12 DIAGNOSIS — I429 Cardiomyopathy, unspecified: Secondary | ICD-10-CM

## 2014-09-12 DIAGNOSIS — R079 Chest pain, unspecified: Secondary | ICD-10-CM

## 2014-09-12 DIAGNOSIS — I119 Hypertensive heart disease without heart failure: Secondary | ICD-10-CM

## 2014-09-12 LAB — BASIC METABOLIC PANEL
ANION GAP: 10 (ref 5–15)
BUN: 26 mg/dL — ABNORMAL HIGH (ref 6–20)
CO2: 27 mmol/L (ref 22–32)
Calcium: 9.2 mg/dL (ref 8.9–10.3)
Chloride: 99 mmol/L — ABNORMAL LOW (ref 101–111)
Creatinine, Ser: 1.55 mg/dL — ABNORMAL HIGH (ref 0.61–1.24)
GFR calc Af Amer: >60 mL/min (ref >60)
GFR, EST NON AFRICAN AMERICAN: 55 mL/min — AB (ref >60)
GLUCOSE: 104 mg/dL — AB (ref 65–99)
POTASSIUM: 3.7 mmol/L (ref 3.5–5.1)
SODIUM: 136 mmol/L (ref 135–145)

## 2014-09-12 MED ORDER — METOPROLOL TARTRATE 25 MG PO TABS
25.0000 mg | ORAL_TABLET | Freq: Two times a day (BID) | ORAL | Status: DC
  Administered 2014-09-12 – 2014-09-14 (×5): 25 mg via ORAL
  Filled 2014-09-12 (×5): qty 1

## 2014-09-12 MED ORDER — REGADENOSON 0.4 MG/5ML IV SOLN
INTRAVENOUS | Status: AC
  Administered 2014-09-12: 0.4 mg via INTRAVENOUS
  Filled 2014-09-12: qty 5

## 2014-09-12 MED ORDER — LISINOPRIL 10 MG PO TABS
10.0000 mg | ORAL_TABLET | Freq: Every day | ORAL | Status: DC
  Administered 2014-09-13 – 2014-09-15 (×3): 10 mg via ORAL
  Filled 2014-09-12 (×3): qty 1

## 2014-09-12 NOTE — Progress Notes (Addendum)
Pt allowed respiratory to put on the CPAP at 0000.  He wore the mask until 0130 when he asked that it be removed because he "felt like it was smothering him".  He had occasional desaturations down into the 70's, but was in the mid to low 90's for most of the night.  Will continue to monitor.

## 2014-09-12 NOTE — Progress Notes (Addendum)
Subjective: Some SOB last pm none this AM, BP elevated, ST during the night as well. Only able to wear CPAP mask from 0000  until 0130- it was smothering him.  He had occasional desaturations down into the 70's,  Objective: Vital signs in last 24 hours: Temp:  [97.3 F (36.3 C)-98.4 F (36.9 C)] 97.3 F (36.3 C) (09/01 0828) Pulse Rate:  [56-99] 89 (09/01 0828) Resp:  [13-29] 20 (09/01 0828) BP: (123-168)/(80-116) 168/116 mmHg (09/01 0916) SpO2:  [92 %-98 %] 98 % (09/01 0828) Weight:  [308 lb 6.8 oz (139.9 kg)] 308 lb 6.8 oz (139.9 kg) (09/01 0500) Weight change: -4 lb 6.5 oz (-2 kg) Last BM Date: 09/11/14 Intake/Output from previous day: -3060 since admit -8360 08/31 0701 - 09/01 0700 In: 240 [P.O.:240] Out: 3300 [Urine:3300] Intake/Output this shift: Total I/O In: -  Out: 500 [Urine:500]  PE: General:Pleasant affect, NAD Skin:Warm and dry, brisk capillary refill HEENT:normocephalic, sclera clear, mucus membranes moist Heart:S1S2 RRR without murmur, gallup, rub or click Lungs:clear without rales, rhonchi, or wheezes ZOX:WRUEA, soft, non tender, + BS, do not palpate liver spleen or masses Ext:no lower ext edema, 2+ pedal pulses, 2+ radial pulses Neuro:alert and oriented X 3, MAE, follows commands, + facial symmetry  tele: ST this AM- now SR to S. arrthymia   Lab Results:  Recent Labs  09/09/14 1859  WBC 5.5  HGB 13.0  HCT 40.5  PLT 288   BMET  Recent Labs  09/11/14 1219 09/12/14 0317  NA 138 136  K 4.0 3.7  CL 102 99*  CO2 28 27  GLUCOSE 93 104*  BUN 17 26*  CREATININE 1.35* 1.55*  CALCIUM 9.1 9.2    Recent Labs  09/09/14 2237 09/10/14 0550  TROPONINI 0.03 0.03    Lab Results  Component Value Date   CHOL 141 09/10/2014   HDL 31* 09/10/2014   LDLCALC 95 09/10/2014   TRIG 77 09/10/2014   CHOLHDL 4.5 09/10/2014   Lab Results  Component Value Date   HGBA1C 6.4 01/16/2014     Lab Results  Component Value Date   TSH 1.448  09/09/2014    Hepatic Function Panel No results for input(s): PROT, ALBUMIN, AST, ALT, ALKPHOS, BILITOT, BILIDIR, IBILI in the last 72 hours.  Recent Labs  09/10/14 0550  CHOL 141   No results for input(s): PROTIME in the last 72 hours.     Studies/Results: ECHO: Study Conclusions  - Left ventricle: False tendon in LV apex of no clinical significance. The cavity size was normal. There was moderate concentric hypertrophy. Systolic function was moderately to severely reduced. The estimated ejection fraction was in the range of 30% to 35%. Diffuse hypokinesis. There was an increased relative contribution of atrial contraction to ventricular filling. Doppler parameters are consistent with abnormal left ventricular relaxation (grade 1 diastolic dysfunction). - Aorta: Aortic root dimension: 40 mm (ED). - Aortic root: The aortic root was mildly dilated. - Mitral valve: There was trivial regurgitation. - Pericardium, extracardiac: A trivial pericardial effusion was identified posterior to the heart.    Medications: I have reviewed the patient's current medications. Scheduled Meds: . aspirin EC  81 mg Oral Daily  . budesonide (PULMICORT) nebulizer solution  0.25 mg Nebulization BID  . enoxaparin (LOVENOX) injection  40 mg Subcutaneous Q24H  . furosemide  40 mg Intravenous BID  . lisinopril  10 mg Oral BID  . metoprolol tartrate  12.5 mg Oral BID  Continuous Infusions:  PRN Meds:.acetaminophen, albuterol, nitroGLYCERIN, ondansetron (ZOFRAN) IV  Assessment/Plan: Principal Problem:   Acute on chronic combined systolic and diastolic CHF, NYHA class 3- EF is mildly decreased from 2014 is this due to ischemia, with mildly elevated troponin-0.06  Lexiscan planned to look for CAD as cause of HF.   --negative 7860 since admit and wt down 12 lbs since admit on lasix 40 BID --Cr climbing today 1.55     Active Problems:   Hypertensive heart disease- still poorly  controlled, on lasix, lisinopril at 10 BID lopressor 12.5 BID concern with ACE with rising Cr.  ? Add hydralazine.     Mild persistent asthma- no wheezes now   Cardiomegaly   Obesity, Class III, BMI 40-49.9 (morbid obesity)   Sleep apnea- has difficulty with mask   AKI (acute kidney injury)- ? 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 :15 minutes. [facility] R  Nurse Practitioner Certified Pager [phone] or after 5pm and on weekends call [phone] 09/12/2014, 10:01 AM   Patient seen and examined. Agree with assessment and plan. Had first part of 2 day protocol of lexiscan nuclear study today. Had difficulty lying on his back. Will decrease lisinopril back to 10 mg daily. Cr increased to 1.55 today. No chest pain. Will ultimately add amlodipine for improved BP but wait until after nuclear stress test tomorrow to start.  Will increase metoprolol to 25 mg bid.Lennette Bihari, MD, [facility] 09/12/2014 2:02 PM

## 2014-09-12 NOTE — Progress Notes (Signed)
Pt states he will place himself on cpap when ready for bed, encouraged to call if he needs assistance

## 2014-09-12 NOTE — Progress Notes (Signed)
First day of 2 day nuc study completed without complications.

## 2014-09-12 NOTE — Clinical Documentation Improvement (Signed)
Cardiology  A cause and effect relationship may not be assumed and must be documented by a provider.  Please clarify the relationship, if any, between "chest pain" and "acute on chronic combined CHF".  Are the conditions:   Due to or associated with each other  Unrelated to each other  Other  Clinically Undetermined  Please exercise your independent, professional judgment when responding. A specific answer is not anticipated or expected.   Thank You,  Beverley Fiedler CDI [employer] Information Management [place] [phone]

## 2014-09-13 ENCOUNTER — Encounter (HOSPITAL_COMMUNITY): Payer: 59

## 2014-09-13 ENCOUNTER — Inpatient Hospital Stay (HOSPITAL_COMMUNITY): Payer: 59

## 2014-09-13 LAB — BASIC METABOLIC PANEL
ANION GAP: 10 (ref 5–15)
BUN: 25 mg/dL — ABNORMAL HIGH (ref 6–20)
CHLORIDE: 99 mmol/L — AB (ref 101–111)
CO2: 27 mmol/L (ref 22–32)
Calcium: 9 mg/dL (ref 8.9–10.3)
Creatinine, Ser: 1.55 mg/dL — ABNORMAL HIGH (ref 0.61–1.24)
GFR calc non Af Amer: 55 mL/min — ABNORMAL LOW (ref >60)
Glucose, Bld: 102 mg/dL — ABNORMAL HIGH (ref 65–99)
POTASSIUM: 3.7 mmol/L (ref 3.5–5.1)
SODIUM: 136 mmol/L (ref 135–145)

## 2014-09-13 MED ORDER — AMLODIPINE BESYLATE 2.5 MG PO TABS
2.5000 mg | ORAL_TABLET | Freq: Every day | ORAL | Status: DC
  Administered 2014-09-14 – 2014-09-20 (×7): 2.5 mg via ORAL
  Filled 2014-09-13 (×8): qty 1

## 2014-09-13 MED ORDER — TECHNETIUM TC 99M SESTAMIBI GENERIC - CARDIOLITE
30.0000 | Freq: Once | INTRAVENOUS | Status: AC | PRN
  Administered 2014-09-13: 30 via INTRAVENOUS

## 2014-09-13 MED ORDER — TECHNETIUM TC 99M SESTAMIBI - CARDIOLITE
30.0000 | Freq: Once | INTRAVENOUS | Status: AC | PRN
  Administered 2014-09-12: 30 via INTRAVENOUS

## 2014-09-13 MED ORDER — TECHNETIUM TC 99M SESTAMIBI GENERIC - CARDIOLITE: 10.0000 | Freq: Once | INTRAVENOUS | Status: DC | PRN

## 2014-09-13 MED ORDER — FUROSEMIDE 40 MG PO TABS
40.0000 mg | ORAL_TABLET | Freq: Two times a day (BID) | ORAL | Status: DC
  Administered 2014-09-13 – 2014-09-15 (×4): 40 mg via ORAL
  Filled 2014-09-13 (×2): qty 2
  Filled 2014-09-13 (×2): qty 1

## 2014-09-13 NOTE — Progress Notes (Signed)
Assisted pt with putting on CPAP at 0015. Pt wore CPAP for most of the night with no issues of desaturation. Will pass on to AM RN.  Alba Destine, RN, BSN

## 2014-09-13 NOTE — Progress Notes (Signed)
Pt states he will place himself on cpap when ready for bed; encouraged him to call if he needs assistance

## 2014-09-13 NOTE — Progress Notes (Signed)
Patient Name: Noah Rodriguez Date of Encounter: 09/13/2014     Principal Problem:   Acute on chronic combined systolic and diastolic CHF, NYHA class 3 Active Problems:   Hypertensive heart disease   Mild persistent asthma   Cardiomegaly   Obesity, Class III, BMI 40-49.9 (morbid obesity)   Sleep apnea   AKI (acute kidney injury)   Hypokalemia   Elevated troponin   Epigastric pain   NICM (nonischemic cardiomyopathy)   Acute on chronic combined systolic and diastolic CHF, NYHA class 1    SUBJECTIVE  He is breathing the best he has since admission per patient  CURRENT MEDS . aspirin EC  81 mg Oral Daily  . budesonide (PULMICORT) nebulizer solution  0.25 mg Nebulization BID  . enoxaparin (LOVENOX) injection  40 mg Subcutaneous Q24H  . furosemide  40 mg Intravenous BID  . lisinopril  10 mg Oral Daily  . metoprolol tartrate  25 mg Oral BID    OBJECTIVE  Filed Vitals:   09/13/14 0021 09/13/14 0400 09/13/14 0401 09/13/14 0448  BP: 145/97 141/75    Pulse: 70 52    Temp: 97.4 F (36.3 C)  97.8 F (36.6 C)   TempSrc: Oral  Oral   Resp: 17 16    Height:      Weight:    305 lb 8.9 oz (138.6 kg)  SpO2: 97% 90%      Intake/Output Summary (Last 24 hours) at 09/13/14 1054 Last data filed at 09/13/14 0900  Gross per 24 hour  Intake    240 ml  Output   1400 ml  Net  -1160 ml   Filed Weights   09/11/14 0700 09/12/14 0500 09/13/14 0448  Weight: 312 lb 13.3 oz (141.9 kg) 308 lb 6.8 oz (139.9 kg) 305 lb 8.9 oz (138.6 kg)    PHYSICAL EXAM  General: Pleasant, NAD. Obese  Neuro: Alert and oriented X 3. Moves all extremities spontaneously. Psych: Normal affect. HEENT:  Normal  Neck: Supple without bruits or JVD. Lungs:  Resp regular and unlabored, CTA. Heart: RRR no s3, s4, or murmurs. Abdomen: Soft, non-tender, non-distended, BS + x 4.  Extremities: No clubbing, cyanosis. Trace edema. DP/PT/Radials 2+ and equal bilaterally.  Accessory Clinical  Findings  CBC No results for input(s): WBC, NEUTROABS, HGB, HCT, MCV, PLT in the last 72 hours. Basic Metabolic Panel  Recent Labs  09/12/14 0317 09/13/14 0226  NA 136 136  K 3.7 3.7  CL 99* 99*  CO2 27 27  GLUCOSE 104* 102*  BUN 26* 25*  CREATININE 1.55* 1.55*  CALCIUM 9.2 9.0    TELE  NSR   Radiology/Studies  Dg Chest 2 View  09/09/2014   CLINICAL DATA:  Mid chest and upper abdominal cramping for 1 week. Shortness of breath and nonproductive cough.  EXAM: CHEST  2 VIEW  COMPARISON:  PA and lateral chest 01/16/2014 and 11/29/2012.  FINDINGS: There is cardiomegaly and pulmonary vascular congestion. No consolidative process, pneumothorax or effusion is identified. No focal bony abnormality is seen.  IMPRESSION: No acute disease.  Cardiomegaly and pulmonary vascular congestion.   Electronically Signed   By: Drusilla Kanner M.D.   On: 09/09/2014 09:58   Dg Abd 1 View  09/09/2014   CLINICAL DATA:  Acute onset severe abdominal pain earlier today.  EXAM: Portable ABDOMEN - 1 VIEW  COMPARISON:  None.  FINDINGS: Bowel gas pattern unremarkable without evidence of obstruction or significant ileus. Moderate stool burden in the colon. No visible opaque  urinary tract calculi, though the examination is less than optimal using portable technique, given the patient's body habitus.  IMPRESSION: No acute abdominal abnormality.   Electronically Signed   By: Hulan Saas M.D.   On: 09/09/2014 19:39    2D Echocardiogram 8.30.2016 Study Conclusions - Left ventricle: False tendon in LV apex of no clinical significance. The cavity size was normal. There was moderate concentric hypertrophy. Systolic function was moderately to severely reduced. The estimated ejection fraction was in the range of 30% to 35%. Diffuse hypokinesis. There was an increased relative contribution of atrial contraction to ventricular filling. Doppler parameters are consistent with abnormal left ventricular  relaxation (grade 1 diastolic dysfunction). - Aorta: Aortic root dimension: 40 mm (ED). - Aortic root: The aortic root was mildly dilated. - Mitral valve: There was trivial regurgitation. - Pericardium, extracardiac: A trivial pericardial effusion was identified posterior to the heart.   ASSESSMENT AND PLAN Noah Rodriguez is a 39 y.o. male with a history of HTN, asthma, chronic LE edema, chronic systolic CHF (EF 15-61%) and non-compliance who presented to [facility] 09/09/14 for evaluation of worsening sob and epigastric/substernal chest pain and directly admitted to [facility] for mild elevated troponin.   1. Elevated troponin in setting of atypical chest and epigastric pain - Troponin in ED elevated to 0.06. Cyclic troponins have been <0.03. - No further c/p. - Echo yesterday w/ persistent LV dysfxn. - Continue ASA, ACE-I, B1 selective BB. - Underwent 2 day myoview.Rest images done today. Awaiting formal read  2. Acute on chronic combined systolic and diastolic CHF - EF 30-35% w/ diff HK, Gr 1 DD by echo 09/10/14- relatively stable since 2014. - Net -9.4L since admission. - Wt down 15 lbs since admission (bed scale). This is the lowest he's been since January. - Volume looks pretty good on exam though exam complicated by body habitus.He is breathing the best he has since admission per patient. Currently on IV Lasix 40mg  BID. I think he is near his dry weight and will convert him to PO Lasix 40mg  BID. -Cont Lisinopril 10mg  and Lopressor 25mg  BID - We discussed the importance of daily weights, sodium restriction, medication compliance, and symptom reporting and he verbalizes understanding.  3. Mild persistent asthma - No active wheezing. - Continue home medical therapy. - Tolerating oral BB.  4. Hypokalemia  - Now resolved  5. Obesity, Class III, BMI 40-49.9 (morbid obesity) - Encourage lifestyle modifications.  6. Sleep apnea - Sleep study conducted in 05/2010  and patient was recommended CPAP at that time, but could not financially afford it. CM consulted and arrangements for assistance made however pt has [facility] insurance and thus does not qualify for assistance. Further, he refused to use CPAP last night but says that that was b/c he felt like he was smothering. Will need outpt f/u in sleep clinic (? Dr. Tresa Endo) to explore other options.  7. AKI (acute kidney injury) - Creatinine elevated to 1.55 today ; 1.38 on admission. I will convert his Lasix from IV to PO.   8. Essential HTN - BP variable but remains elevated overall. - Cont metoprolol tart 25mg  BID and lisinopril 10mg  qd. Will add amlodipine 2.5mg  qd today   9. Noncompliance - Likely to continue to be a major issue impacting his care.  Billy Fischer PA-C  Pager [phone]  Patient seen and examined. Agree with assessment and plan. I/O -[id number]. Breathing better. Wt 320 ->305 since admission. Cr 1.55, will change to  oral lasix. Awaintng nuclear imaging analysis; not yet in epic.  Discussed lifestyle modification. ? DC this weekend if low risk nuclear study.   Lennette Bihari, MD, [facility] 09/13/2014 4:08 PM

## 2014-09-14 DIAGNOSIS — R079 Chest pain, unspecified: Secondary | ICD-10-CM | POA: Diagnosis present

## 2014-09-14 DIAGNOSIS — R072 Precordial pain: Secondary | ICD-10-CM

## 2014-09-14 LAB — GLUCOSE, CAPILLARY: Glucose-Capillary: 166 mg/dL — ABNORMAL HIGH (ref 65–99)

## 2014-09-14 NOTE — Progress Notes (Signed)
Patient was ambulated in hallway about 200 feet. He did not use a walker. Complained of feeling a little tired but O2 Saturation remained 92-100 for the duration of his ambulation. Tolerated ambulation well.

## 2014-09-14 NOTE — Progress Notes (Signed)
Patient came to the floor around 1700, alert oriented, denies pain, no shortness of breath. SR on the monitor in 80. BP elevated, will recheck bp and continue to monitor patient.

## 2014-09-14 NOTE — Progress Notes (Signed)
Report given to [place] room 30 at 1630 to  [place], [place]. Patient transferred via wheelchair with laptop computer, glasses, cell phone, wallet, and clothes/shoes. CCMD called to alert them to the transfer; patient will stay on telemetry for transfer.   Milford Cage, RN

## 2014-09-14 NOTE — Procedures (Signed)
RT set up CPAP and checked settings because pt states that he likes to placed himself when ready.  Pt will notify RT is assistance is needed.

## 2014-09-14 NOTE — Progress Notes (Signed)
Patient Name: Noah Rodriguez Date of Encounter: 09/14/2014  Principal Problem:   Acute on chronic combined systolic and diastolic CHF, NYHA class 3 Active Problems:   Hypertensive heart disease   Mild persistent asthma   Cardiomegaly   Obesity, Class III, BMI 40-49.9 (morbid obesity)   Sleep apnea   AKI (acute kidney injury)   Hypokalemia   Elevated troponin   Epigastric pain   NICM (nonischemic cardiomyopathy)   Primary Cardiologist: Dr Tresa Endo  Patient Profile: 40 y.o. male with a history of HTN, Asthma, chronic LE edema and non-compliant to medications was admitted 08/29 w/ D-CHF  SUBJECTIVE: SOB when he sleeps on his back, says wore CPAP a short time last pm. BP up this am, cuff improperly placed  OBJECTIVE Filed Vitals:   09/13/14 2007 09/13/14 2111 09/13/14 2348 09/14/14 0500  BP:  116/77 106/75 104/60  Pulse:  80 66 62  Temp:   97.5 F (36.4 C) 97.9 F (36.6 C)  TempSrc:   Oral Oral  Resp:   21 22  Height:      Weight:    305 lb 12.5 oz (138.7 kg)  SpO2: 98%  93% 94%    Intake/Output Summary (Last 24 hours) at 09/14/14 0811 Last data filed at 09/14/14 0000  Gross per 24 hour  Intake   1200 ml  Output   1825 ml  Net   -625 ml   Filed Weights   09/12/14 0500 09/13/14 0448 09/14/14 0500  Weight: 308 lb 6.8 oz (139.9 kg) 305 lb 8.9 oz (138.6 kg) 305 lb 12.5 oz (138.7 kg)    PHYSICAL EXAM General: Well developed, well nourished, male in no acute distress. Head: Normocephalic, atraumatic.  Neck: Supple without bruits, JVD not elevated. Lungs:  Resp regular and unlabored, CTA. Heart: RRR, S1, S2, no S3, S4, or murmur; no rub. Abdomen: Soft, non-tender, non-distended, BS + x 4.  Extremities: No clubbing, cyanosis, edema.  Neuro: Alert and oriented X 3. Moves all extremities spontaneously. Psych: Normal affect.  LABS: Basic Metabolic Panel:  Recent Labs  16/10/96 0317 09/13/14 0226  NA 136 136  K 3.7 3.7  CL 99* 99*  CO2 27 27  GLUCOSE  104* 102*  BUN 26* 25*  CREATININE 1.55* 1.55*  CALCIUM 9.2 9.0   BNP:  B NATRIURETIC PEPTIDE  Date/Time Value Ref Range Status  09/09/2014 09:40 AM 98.0 0.0 - 100.0 pg/mL Final   TELE:  SR, S brady to 40s, yesterday am and overnight.     Radiology/Studies: Nm Myocar Multi W/spect W/wall Motion / Ef 09/13/2014   CLINICAL DATA:  39 year old male with 1 week history of chest pain and shortness of breath  EXAM: MYOCARDIAL IMAGING WITH SPECT (REST AND PHARMACOLOGIC-STRESS - 2 DAY PROTOCOL)  GATED LEFT VENTRICULAR WALL MOTION STUDY  LEFT VENTRICULAR EJECTION FRACTION  TECHNIQUE: Standard myocardial SPECT imaging was performed after resting intravenous injection of 10 mCi Tc-59m sestamibi. Subsequently, on a second day, intravenous infusion of Lexiscan was performed under the supervision of the Cardiology staff. At peak effect of the drug, 30 mCi Tc-27m sestamibi was injected intravenously and standard myocardial SPECT imaging was performed. Quantitative gated imaging was also performed to evaluate left ventricular wall motion, and estimate left ventricular ejection fraction.  COMPARISON:  Chest x-ray 09/09/2014  FINDINGS: Perfusion: Decreased radiotracer uptake in the inferior and inferolateral walls at both rest and stress. While this may be partially due to diaphragmatic attenuation, a remote infarct is favored. Additionally, there is  a small focus of decreased radiotracer activity within the apical mid ventricle following stress concerning for a region of reversible ischemia.  Wall Motion: Global hypokinesis.  The left ventricle is dilated.  Left Ventricular Ejection Fraction: 281 %  End diastolic volume 208 ml  End systolic volume 26 ml  IMPRESSION: 1. Small focus of reversible ischemia in the apical mid ventricle. Additionally, there appears to be a region of prior infarct/ scarring in the inferior and inferolateral walls.  2. Global hypokinesis with marked left ventricular dilatation.  3. Left  ventricular ejection fraction 26%  4. High-risk stress test findings*.  *2012 Appropriate Use Criteria for Coronary Revascularization Focused Update: J Am Coll Cardiol. 2012;59(9):857-881. http://content.dementiazones.com.aspx?articleid=1201161   Electronically Signed   By: Malachy Moan M.D.   On: 09/13/2014 13:47     Current Medications:  . amLODipine  2.5 mg Oral Daily  . aspirin EC  81 mg Oral Daily  . budesonide (PULMICORT) nebulizer solution  0.25 mg Nebulization BID  . enoxaparin (LOVENOX) injection  40 mg Subcutaneous Q24H  . furosemide  40 mg Oral BID  . lisinopril  10 mg Oral Daily  . metoprolol tartrate  25 mg Oral BID      ASSESSMENT AND PLAN:  1. Elevated troponin in setting of atypical chest and epigastric pain - Troponin in ED elevated to 0.06. Cyclic troponins have been <0.03. - No further c/p. - Echo yesterday w/ persistent LV dysfxn. - Continue ASA, ACE-I, B1 selective BB. - Underwent 2 day myoview - results above, MD review, high-risk study - briefly discussed cath w/ patient and wife  2. Acute on chronic combined systolic and diastolic CHF - EF 30-35% w/ diff HK, Gr 1 DD by echo 09/10/14- relatively stable since 2014. - Net -10.4L since admission. - Wt down 15 lbs since admission (bed scale). This is the lowest he's been since January. - Volume looks pretty good on exam though exam complicated by body habitus.He is breathing the best he has since admission per patient. Converted to PO Lasix 40mg  BID 09/02. -Cont Lisinopril 10mg  and Lopressor 25mg  BID - We discussed the importance of daily weights, sodium restriction, medication compliance, and symptom reporting and he verbalizes understanding. - OK to increase activity and needs standing weights  3. Mild persistent asthma - No active wheezing. - Continue home medical therapy. - Tolerating oral BB.  4. Hypokalemia  - Now resolved  5. Obesity, Class III, BMI 40-49.9 (morbid obesity) -  Encourage lifestyle modifications.  6. Sleep apnea - Sleep study conducted in 05/2010 and patient was recommended CPAP at that time, but could not financially afford it. CM consulted and arrangements for assistance made however pt has [facility] insurance and thus does not qualify for assistance. Further, he refused to use CPAP one night but says that that was b/c he felt like he was smothering. - says used it briefly last pm, advised that if he is not using it here, no need to d/c with it. - Will need outpt f/u, not sure if insurance will cover CPAP since sleep study is 39 yr old.   7. AKI (acute kidney injury) - Creatinine elevated to 1.55 today ; 1.38 on admission. Now on Lasix PO.  - For cath, MD advise on holding Lasix tomorrow.  8. Essential HTN - BP variable but remains elevated overall. - Cont metoprolol tart 25mg  BID and lisinopril 10mg  qd. Amlodipine 2.5mg  qd added 09/02   9. Noncompliance - Likely to continue to be a major  issue impacting his care.  Otherwise, continue current therapy, needs tx telemetry, increase activity, standing weights.  Signed, Barrett, Rhonda , PA-C 8:11 AM 09/14/2014 As above, patient seen and examined. He denies dyspnea or chest pain. Nuclear study shows reduced LV function and possible prior infarct and apical ischemia. He will require right and left cardiac catheterization. We will plan to proceed on Tuesday. The risks and benefits were discussed and he agrees to proceed. Would hold Lasix 2 days prior to the procedure given baseline renal insufficiency. No ventriculogram to help avoid contrast nephropathy. Continue remaining cardiac medications. Olga Millers

## 2014-09-15 DIAGNOSIS — R9430 Abnormal result of cardiovascular function study, unspecified: Secondary | ICD-10-CM | POA: Diagnosis present

## 2014-09-15 DIAGNOSIS — Z9114 Patient's other noncompliance with medication regimen: Secondary | ICD-10-CM

## 2014-09-15 LAB — BASIC METABOLIC PANEL
ANION GAP: 7 (ref 5–15)
BUN: 22 mg/dL — ABNORMAL HIGH (ref 6–20)
CHLORIDE: 102 mmol/L (ref 101–111)
CO2: 28 mmol/L (ref 22–32)
CREATININE: 1.35 mg/dL — AB (ref 0.61–1.24)
Calcium: 9 mg/dL (ref 8.9–10.3)
GFR calc non Af Amer: >60 mL/min (ref >60)
Glucose, Bld: 100 mg/dL — ABNORMAL HIGH (ref 65–99)
POTASSIUM: 3.7 mmol/L (ref 3.5–5.1)
Sodium: 137 mmol/L (ref 135–145)

## 2014-09-15 MED ORDER — SPIRONOLACTONE 25 MG PO TABS
12.5000 mg | ORAL_TABLET | Freq: Every day | ORAL | Status: DC
  Administered 2014-09-15: 12.5 mg via ORAL
  Filled 2014-09-15: qty 1

## 2014-09-15 MED ORDER — LISINOPRIL 10 MG PO TABS
10.0000 mg | ORAL_TABLET | Freq: Two times a day (BID) | ORAL | Status: DC
  Administered 2014-09-15 – 2014-09-20 (×10): 10 mg via ORAL
  Filled 2014-09-15 (×10): qty 1

## 2014-09-15 MED ORDER — CARVEDILOL 12.5 MG PO TABS
12.5000 mg | ORAL_TABLET | Freq: Two times a day (BID) | ORAL | Status: DC
  Administered 2014-09-15: 12.5 mg via ORAL
  Filled 2014-09-15: qty 1

## 2014-09-15 MED ORDER — METOPROLOL TARTRATE 12.5 MG HALF TABLET
12.5000 mg | ORAL_TABLET | Freq: Two times a day (BID) | ORAL | Status: DC
  Administered 2014-09-15: 12.5 mg via ORAL
  Filled 2014-09-15: qty 1

## 2014-09-15 NOTE — Progress Notes (Signed)
Pt places self on/off cpap.  Rt will continue to monitor. 

## 2014-09-15 NOTE — Progress Notes (Signed)
Patient Name: Noah Rodriguez Date of Encounter: 09/15/2014  Principal Problem:   Acute on chronic combined systolic and diastolic CHF, NYHA class 3 Active Problems:   Cardiomyopathy-etiology not yet determined   Chest pain   Abnormal cardiac function test   Hypertensive heart disease   Obesity, Class III, BMI 40-49.9 (morbid obesity)   Sleep apnea   AKI (acute kidney injury)   Elevated troponin   Non compliance w medication regimen   Primary Cardiologist: Dr Tresa Endo  Patient Profile: 39 y.o. Morbidly obese AA male with a history of HTN,Cardiomyopathy, Asthma, chronic LE edema, and non-compliant with medications,  admitted 08/29 with CHF, epigastric pain, acute RI, and elevated Troponin. He has diuresed > 11L and lost 16 lbs. Myoview done was abnormal. Demonstrating ejection fraction 26% and a possible inferior wall MI.  SUBJECTIVE: SOB when he sleeps on his back, says wore CPAP a short time last pm.   OBJECTIVE Filed Vitals:   09/14/14 1712 09/14/14 2021 09/14/14 2220 09/15/14 0646  BP: 163/102  155/88 140/89  Pulse: 80  94 89  Temp: 98.2 F (36.8 C)  97.2 F (36.2 C) 97.4 F (36.3 C)  TempSrc: Oral  Oral Oral  Resp: Height:  (1.702 m)     Weight: 307 lb 5.1 oz (139.4 kg)   306 lb 11.2 oz (139.118 kg)  SpO2: 96% 94% 95% 93%    Intake/Output Summary (Last 24 hours) at 09/15/14 0830 Last data filed at 09/15/14 0651  Gross per 24 hour  Intake    840 ml  Output   1300 ml  Net   -460 ml   Filed Weights   09/14/14 0500 09/14/14 1712 09/15/14 0646  Weight: 305 lb 12.5 oz (138.7 kg) 307 lb 5.1 oz (139.4 kg) 306 lb 11.2 oz (139.118 kg)    PHYSICAL EXAM General: Morbidly obese AA male no acute distress. Head: Normocephalic, atraumatic.  Neck: Supple without bruits, JVD not elevated. Lungs:  Resp regular and unlabored, CTA. Heart: RRR, S1, S2, no S3, S4, or murmur; no rub. Abdomen: obese Extremities: No clubbing, cyanosis, edema.  Neuro: Alert  and oriented X 3. Moves all extremities spontaneously. Psych: Normal affect.  LABS: Basic Metabolic Panel:  Recent Labs  13/08/65 0226 09/15/14 0450  NA 136 137  K 3.7 3.7  CL 99* 102  CO2 27 28  GLUCOSE 102* 100*  BUN 25* 22*  CREATININE 1.55* 1.35*  CALCIUM 9.0 9.0   BNP:  B NATRIURETIC PEPTIDE  Date/Time Value Ref Range Status  09/09/2014 09:40 AM 98.0 0.0 - 100.0 pg/mL Final   TELE:  SR, S brady to 40s, yesterday am and overnight.     Radiology/Studies: Nm Myocar Multi W/spect W/wall Motion / Ef 09/13/2014    IMPRESSION: 1. Small focus of reversible ischemia in the apical mid ventricle. Additionally, there appears to be a region of prior infarct/ scarring in the inferior and inferolateral walls.  2. Global hypokinesis with marked left ventricular dilatation.  3. Left ventricular ejection fraction 26%  4. High-risk stress test findings*.    Echo: 09/10/14 Study Conclusions  - Left ventricle: False tendon in LV apex of no clinical significance. The cavity size was normal. There was moderate concentric hypertrophy. Systolic function was moderately to severely reduced. The estimated ejection fraction was in the range of 30% to 35%. Diffuse hypokinesis. There was an increased relative contribution of atrial contraction to ventricular filling. Doppler parameters are consistent with abnormal  left ventricular relaxation (grade 1 diastolic dysfunction). - Aorta: Aortic root dimension: 40 mm (ED). - Aortic root: The aortic root was mildly dilated. - Mitral valve: There was trivial regurgitation. - Pericardium, extracardiac: A trivial pericardial effusion was identified posterior to the heart.   Current Medications:  . amLODipine  2.5 mg Oral Daily  . aspirin EC  81 mg Oral Daily  . budesonide (PULMICORT) nebulizer solution  0.25 mg Nebulization BID  . enoxaparin (LOVENOX) injection  40 mg Subcutaneous Q24H  . lisinopril  10 mg Oral Daily  . metoprolol  tartrate  25 mg Oral BID      ASSESSMENT AND PLAN:  1. Elevated troponin in setting of atypical chest and epigastric pain - Troponin in ED elevated to 0.06. Cyclic troponins have been <0.03. - No further c/p. - Myoview abnormal   2. Acute on chronic combined systolic and diastolic CHF - EF 30-35% w/ diff HK, Gr 1 DD by echo 09/10/14- relatively stable since 2014. - Net -11.2L since admission. - Wt down 16 lbs since admission (bed scale). This is the lowest he's been since January. - Volume looks pretty good on exam though exam complicated by body habitus.  3. Mild persistent asthma - No active wheezing. - Continue home medical therapy. - Tolerating oral BB.  4. Hypokalemia  - Now resolved  5. Obesity, Class III, BMI 40-49.9 (morbid obesity) - Encourage lifestyle modifications.  6. Sleep apnea - Sleep study conducted in 05/2010 and patient was recommended CPAP at that time, but could not financially afford it. CM consulted and arrangements for assistance made however pt has [facility] insurance and thus does not qualify for assistance. Further, he refused to use CPAP one night but says that that was b/c he felt like he was smothering. - says used it briefly last pm, advised that if he is not using it here, no need to d/c with it. - Will need outpt f/u, not sure if insurance will cover CPAP since sleep study is 39 yr old.   7. AKI (acute kidney injury) - Creatinine elevated to 1.35 today ; 1.38 on admission. Now on Lasix PO.  - For cath, Tuesday- will hold Lasix today and tomorrow. Also hold Lisinopril AM of cath  8. Essential HTN -poorly controlled - BP variable but remains elevated overall. - Cont metoprolol tart 25mg  BID and lisinopril 10mg  qd. Amlodipine 2.5mg  qd added 09/02   9. Noncompliance - Likely to continue to be a major issue impacting his care.  Plan: Cath Tuesday, no LVgram. Decrease Lopressor- HR 45.   Signed, Abelino Derrick , PA-C 8:30  AM 09/15/2014   With left ventricular dysfunction we will change his metoprolol to carvedilol and uptitrate his lisinopril.  Hydralazine nitrates would be our next addition his blood pressures cannot be better controlled. Will start aldactone  The event that his left ventricular dysfunction persists it would be appropriate to consider an ICD in this young man

## 2014-09-16 DIAGNOSIS — M10072 Idiopathic gout, left ankle and foot: Secondary | ICD-10-CM

## 2014-09-16 DIAGNOSIS — M109 Gout, unspecified: Secondary | ICD-10-CM

## 2014-09-16 DIAGNOSIS — R9430 Abnormal result of cardiovascular function study, unspecified: Secondary | ICD-10-CM

## 2014-09-16 HISTORY — DX: Gout, unspecified: M10.9

## 2014-09-16 LAB — BASIC METABOLIC PANEL
Anion gap: 9 (ref 5–15)
BUN: 19 mg/dL (ref 6–20)
CO2: 26 mmol/L (ref 22–32)
Calcium: 9 mg/dL (ref 8.9–10.3)
Chloride: 101 mmol/L (ref 101–111)
Creatinine, Ser: 1.3 mg/dL — ABNORMAL HIGH (ref 0.61–1.24)
GFR calc Af Amer: >60 mL/min (ref >60)
GFR calc non Af Amer: >60 mL/min (ref >60)
Glucose, Bld: 111 mg/dL — ABNORMAL HIGH (ref 65–99)
Potassium: 4.5 mmol/L (ref 3.5–5.1)
Sodium: 136 mmol/L (ref 135–145)

## 2014-09-16 MED ORDER — COLCHICINE 0.6 MG PO TABS
0.6000 mg | ORAL_TABLET | Freq: Two times a day (BID) | ORAL | Status: DC
  Administered 2014-09-16 – 2014-09-20 (×9): 0.6 mg via ORAL
  Filled 2014-09-16 (×9): qty 1

## 2014-09-16 MED ORDER — PREDNISONE 5 MG (21) PO TBPK: 5.0000 mg | ORAL_TABLET | Freq: Three times a day (TID) | ORAL | Status: DC

## 2014-09-16 MED ORDER — PREDNISONE 5 MG (21) PO TBPK: 10.0000 mg | ORAL_TABLET | Freq: Every evening | ORAL | Status: DC

## 2014-09-16 MED ORDER — SODIUM CHLORIDE 0.9 % IV SOLN: 250.0000 mL | INTRAVENOUS | Status: DC | PRN

## 2014-09-16 MED ORDER — PREDNISONE 5 MG (21) PO TBPK: 5.0000 mg | ORAL_TABLET | ORAL | Status: DC

## 2014-09-16 MED ORDER — PREDNISONE 5 MG (21) PO TBPK: 5.0000 mg | ORAL_TABLET | Freq: Four times a day (QID) | ORAL | Status: DC

## 2014-09-16 MED ORDER — SODIUM CHLORIDE 0.9 % IJ SOLN: 3.0000 mL | INTRAMUSCULAR | Status: DC | PRN

## 2014-09-16 MED ORDER — PREDNISONE 5 MG (21) PO TBPK
10.0000 mg | ORAL_TABLET | Freq: Every morning | ORAL | Status: DC
  Filled 2014-09-16: qty 21

## 2014-09-16 MED ORDER — CARVEDILOL 12.5 MG PO TABS
12.5000 mg | ORAL_TABLET | Freq: Two times a day (BID) | ORAL | Status: DC
  Administered 2014-09-16 – 2014-09-20 (×8): 12.5 mg via ORAL
  Filled 2014-09-16 (×8): qty 1

## 2014-09-16 MED ORDER — SODIUM CHLORIDE 0.9 % IJ SOLN
3.0000 mL | Freq: Two times a day (BID) | INTRAMUSCULAR | Status: DC
  Administered 2014-09-16 – 2014-09-17 (×2): 3 mL via INTRAVENOUS

## 2014-09-16 MED ORDER — SODIUM CHLORIDE 0.9 % IV SOLN
INTRAVENOUS | Status: DC
  Administered 2014-09-16: 18:00:00 via INTRAVENOUS

## 2014-09-16 MED ORDER — HYDROCODONE-ACETAMINOPHEN 5-325 MG PO TABS
1.0000 | ORAL_TABLET | Freq: Four times a day (QID) | ORAL | Status: DC | PRN
  Administered 2014-09-16 – 2014-09-19 (×3): 1 via ORAL
  Filled 2014-09-16 (×3): qty 1

## 2014-09-16 NOTE — Progress Notes (Signed)
Patient states that he will place the CPAP on himself. O2 is bled in and humidification chamber filled with sterile water. RT will continue to monitor.

## 2014-09-16 NOTE — Progress Notes (Signed)
Patient Name: Noah Rodriguez Date of Encounter: 09/16/2014  Principal Problem:   Acute on chronic combined systolic and diastolic CHF, NYHA class 3 Active Problems:   Cardiomyopathy-etiology not yet determined   Chest pain   Abnormal cardiac function test   Hypertensive heart disease   Obesity, Class III, BMI 40-49.9 (morbid obesity)   Sleep apnea   AKI (acute kidney injury)   Elevated troponin   Non compliance w medication regimen   Primary Cardiologist: Dr Tresa Endo  Patient Profile: 39 y.o. Morbidly obese AA male with a history of HTN,Cardiomyopathy, Asthma, chronic LE edema, and non-compliant with medications,  admitted 08/29 with CHF, epigastric pain, acute RI, and elevated Troponin. He has diuresed > 11L and lost 16 lbs. Myoview done was abnormal. Demonstrating ejection fraction 26% and a possible inferior wall MI.  SUBJECTIVE: SOB when he sleeps on his back, says wore CPAP a short time last pm. He has developed pain in his Lt great toe- very tender "can't even walk on it".   OBJECTIVE Filed Vitals:   09/15/14 1450 09/15/14 2020 09/15/14 2023 09/16/14 0544  BP: 150/101  140/88 143/92  Pulse: 58  83 96  Temp: 98.2 F (36.8 C)  97.6 F (36.4 C) 98.2 F (36.8 C)  TempSrc: Oral  Oral Oral  Resp: Height:      Weight:    307 lb 15.7 oz (139.7 kg)  SpO2: 100% 95% 100% 98%    Intake/Output Summary (Last 24 hours) at 09/16/14 0736 Last data filed at 09/16/14 1610  Gross per 24 hour  Intake   1320 ml  Output   1575 ml  Net   -255 ml   Filed Weights   09/14/14 1712 09/15/14 0646 09/16/14 0544  Weight: 307 lb 5.1 oz (139.4 kg) 306 lb 11.2 oz (139.118 kg) 307 lb 15.7 oz (139.7 kg)    PHYSICAL EXAM General: Morbidly obese AA male no acute distress. Head: Normocephalic, atraumatic.  Neck: Supple without bruits, JVD not elevated. Lungs:  Resp regular and unlabored, CTA. Heart: RRR, S1, S2, no S3, S4, or murmur; no rub. Abdomen: obese Extremities: No  clubbing, cyanosis, edema. Very tender Lt great toe Neuro: Alert and oriented X 3. Moves all extremities spontaneously. Psych: Normal affect.  LABS: Basic Metabolic Panel:  Recent Labs  96/04/54 0450  NA 137  K 3.7  CL 102  CO2 28  GLUCOSE 100*  BUN 22*  CREATININE 1.35*  CALCIUM 9.0   BNP:  B NATRIURETIC PEPTIDE  Date/Time Value Ref Range Status  09/09/2014 09:40 AM 98.0 0.0 - 100.0 pg/mL Final   TELE:  SR, S brady to 40s, yesterday am and overnight.     Radiology/Studies: Nm Myocar Multi W/spect W/wall Motion / Ef 09/13/2014    IMPRESSION: 1. Small focus of reversible ischemia in the apical mid ventricle. Additionally, there appears to be a region of prior infarct/ scarring in the inferior and inferolateral walls.  2. Global hypokinesis with marked left ventricular dilatation.  3. Left ventricular ejection fraction 26%  4. High-risk stress test findings*.    Echo: 09/10/14 Study Conclusions  - Left ventricle: False tendon in LV apex of no clinical significance. The cavity size was normal. There was moderate concentric hypertrophy. Systolic function was moderately to severely reduced. The estimated ejection fraction was in the range of 30% to 35%. Diffuse hypokinesis. There was an increased relative contribution of atrial contraction to ventricular filling. Doppler parameters are consistent with  abnormal left ventricular relaxation (grade 1 diastolic dysfunction). - Aorta: Aortic root dimension: 40 mm (ED). - Aortic root: The aortic root was mildly dilated. - Mitral valve: There was trivial regurgitation. - Pericardium, extracardiac: A trivial pericardial effusion was identified posterior to the heart.   Current Medications:  . amLODipine  2.5 mg Oral Daily  . aspirin EC  81 mg Oral Daily  . budesonide (PULMICORT) nebulizer solution  0.25 mg Nebulization BID  . carvedilol  12.5 mg Oral BID WC  . enoxaparin (LOVENOX) injection  40 mg Subcutaneous Q24H   . lisinopril  10 mg Oral BID  . spironolactone  12.5 mg Oral Daily      ASSESSMENT AND PLAN:  1. Elevated troponin in setting of atypical chest and epigastric pain - Troponin in ED elevated to 0.06. Cyclic troponins have been <0.03. - No further c/p. - Myoview abnormal   2. Acute on chronic combined systolic and diastolic CHF - EF 30-35% w/ diff HK, Gr 1 DD by echo 09/10/14- relatively stable since 2014. - Net -11.2L since admission. - Wt down 16 lbs since admission (bed scale). This is the lowest he's been since January. - Volume looks pretty good on exam though exam complicated by body habitus.  3. Mild persistent asthma - No active wheezing. - Continue home medical therapy. - Metoprolol changed to Coreg- watch for asthma exacerbation  4. Hypokalemia  - Now resolved. Aldactone added  5. Obesity, Class III, BMI 40-49.9 (morbid obesity) - Encourage lifestyle modifications.  6. Sleep apnea - Sleep study conducted in 05/2010  - Pt not able to tolerate mask  7. AKI (acute kidney injury) - Creatinine pending today ; 1.38 on admission. Lasix held x 48 hrs pre cath. Aldactone added yesterday and Lisinopril increased.  - For cath, Tuesday- will hold Lasix today and tomorrow. Also hold Lisinopril AM of cath  8. Essential HTN -poorly controlled - BP variable but remains elevated overall. - Metoprolol changed to Coreg 12.5 mg BID (HR 50)  9. Noncompliance - Likely to continue to be a major issue impacting his care.   10.  Acute Gout -  This a new problem for him  Plan: Start steroid dose pack for gout. Lasix held today and tomorrow, ACE to be held in AM. Cath (? -Rt and Lt ) Tuesday- orders written. No LV gram secondary to CRI, labs pending today.   Deland Pretty , PA-C 7:36 AM 09/16/2014   Patient seen and examined. Agree with assessment and plan. No chest pain. Probable gout left great toe. Will give colchicine 0.6 mg bid. Cardioselective BB was initiated  due to asthma concerns and changed yesterday to coreg; will continue as long as wheezing does not develop.  Discussed R and left heart cath with patient; plan tomorrow. Hold lasix and lisinorpril for cath tomorrow.   Lennette Bihari, MD, [facility] 09/16/2014 9:05 AM

## 2014-09-17 ENCOUNTER — Encounter (HOSPITAL_COMMUNITY)
Admission: EM | Disposition: A | Discharge status: Home / Self Care | Payer: Self-pay | Source: Home / Self Care | Attending: Cardiovascular Disease

## 2014-09-17 ENCOUNTER — Encounter (HOSPITAL_COMMUNITY): Payer: Self-pay | Admitting: Cardiovascular Disease

## 2014-09-17 DIAGNOSIS — M109 Gout, unspecified: Secondary | ICD-10-CM

## 2014-09-17 HISTORY — PX: CARDIAC CATHETERIZATION: SHX172

## 2014-09-17 LAB — POCT I-STAT 3, VENOUS BLOOD GAS (G3P V)
Acid-base deficit: 1 mmol/L (ref 0.0–2.0)
Bicarbonate: 24.7 mEq/L — ABNORMAL HIGH (ref 20.0–24.0)
O2 Saturation: 56 %
TCO2: 26 mmol/L (ref 0–100)
pCO2, Ven: 44.8 mmHg — ABNORMAL LOW (ref 45.0–50.0)
pH, Ven: 7.35 — ABNORMAL HIGH (ref 7.250–7.300)
pO2, Ven: 31 mmHg (ref 30.0–45.0)

## 2014-09-17 LAB — POCT I-STAT 3, ART BLOOD GAS (G3+)
Acid-Base Excess: 1 mmol/L (ref 0.0–2.0)
Bicarbonate: 25.9 mEq/L — ABNORMAL HIGH (ref 20.0–24.0)
O2 Saturation: 85 %
TCO2: 27 mmol/L (ref 0–100)
pCO2 arterial: 42.3 mmHg (ref 35.0–45.0)
pH, Arterial: 7.396 (ref 7.350–7.450)
pO2, Arterial: 50 mmHg — ABNORMAL LOW (ref 80.0–100.0)

## 2014-09-17 LAB — CBC
HCT: 41.3 % (ref 39.0–52.0)
Hemoglobin: 13.4 g/dL (ref 13.0–17.0)
MCH: 27.2 pg (ref 26.0–34.0)
MCHC: 32.4 g/dL (ref 30.0–36.0)
MCV: 83.8 fL (ref 78.0–100.0)
Platelets: 324 10*3/uL (ref 150–400)
RBC: 4.93 MIL/uL (ref 4.22–5.81)
RDW: 13.9 % (ref 11.5–15.5)
WBC: 5.6 10*3/uL (ref 4.0–10.5)

## 2014-09-17 LAB — BASIC METABOLIC PANEL
Anion gap: 7 (ref 5–15)
BUN: 19 mg/dL (ref 6–20)
CO2: 26 mmol/L (ref 22–32)
Calcium: 8.8 mg/dL — ABNORMAL LOW (ref 8.9–10.3)
Chloride: 103 mmol/L (ref 101–111)
Creatinine, Ser: 1.3 mg/dL — ABNORMAL HIGH (ref 0.61–1.24)
GFR calc Af Amer: >60 mL/min (ref >60)
GFR calc non Af Amer: >60 mL/min (ref >60)
Glucose, Bld: 97 mg/dL (ref 65–99)
Potassium: 3.8 mmol/L (ref 3.5–5.1)
Sodium: 136 mmol/L (ref 135–145)

## 2014-09-17 LAB — PROTIME-INR
INR: 1.11 (ref 0.00–1.49)
Prothrombin Time: 14.5 seconds (ref 11.6–15.2)

## 2014-09-17 SURGERY — RIGHT/LEFT HEART CATH AND CORONARY ANGIOGRAPHY
Anesthesia: LOCAL

## 2014-09-17 MED ORDER — SODIUM CHLORIDE 0.9 % IJ SOLN
3.0000 mL | Freq: Two times a day (BID) | INTRAMUSCULAR | Status: DC
  Administered 2014-09-18 – 2014-09-19 (×4): 3 mL via INTRAVENOUS

## 2014-09-17 MED ORDER — HEPARIN SODIUM (PORCINE) 5000 UNIT/ML IJ SOLN
5000.0000 [IU] | Freq: Three times a day (TID) | INTRAMUSCULAR | Status: DC
  Administered 2014-09-18 – 2014-09-20 (×7): 5000 [IU] via SUBCUTANEOUS
  Filled 2014-09-17 (×6): qty 1

## 2014-09-17 MED ORDER — FENTANYL CITRATE (PF) 100 MCG/2ML IJ SOLN
INTRAMUSCULAR | Status: DC | PRN
  Administered 2014-09-17: 50 ug via INTRAVENOUS
  Administered 2014-09-17: 25 ug via INTRAVENOUS

## 2014-09-17 MED ORDER — FENTANYL CITRATE (PF) 100 MCG/2ML IJ SOLN
INTRAMUSCULAR | Status: AC
  Filled 2014-09-17: qty 4

## 2014-09-17 MED ORDER — LIDOCAINE HCL (PF) 1 % IJ SOLN
INTRAMUSCULAR | Status: DC | PRN
  Administered 2014-09-17: 12:00:00

## 2014-09-17 MED ORDER — IOHEXOL 350 MG/ML SOLN
INTRAVENOUS | Status: DC | PRN
  Administered 2014-09-17: 40 mL via INTRA_ARTERIAL

## 2014-09-17 MED ORDER — MIDAZOLAM HCL 2 MG/2ML IJ SOLN
INTRAMUSCULAR | Status: AC
  Filled 2014-09-17: qty 4

## 2014-09-17 MED ORDER — LIDOCAINE HCL (PF) 1 % IJ SOLN
INTRAMUSCULAR | Status: AC
  Filled 2014-09-17: qty 30

## 2014-09-17 MED ORDER — MIDAZOLAM HCL 2 MG/2ML IJ SOLN
INTRAMUSCULAR | Status: DC | PRN
  Administered 2014-09-17: 1 mg via INTRAVENOUS
  Administered 2014-09-17: 2 mg via INTRAVENOUS

## 2014-09-17 MED ORDER — SODIUM CHLORIDE 0.9 % IV SOLN: 250.0000 mL | INTRAVENOUS | Status: DC | PRN

## 2014-09-17 MED ORDER — ACETAMINOPHEN 325 MG PO TABS: 650.0000 mg | ORAL_TABLET | ORAL | Status: DC | PRN

## 2014-09-17 MED ORDER — SODIUM CHLORIDE 0.9 % IJ SOLN
3.0000 mL | INTRAMUSCULAR | Status: DC | PRN
  Administered 2014-09-20: 3 mL via INTRAVENOUS
  Filled 2014-09-17: qty 3

## 2014-09-17 MED ORDER — ONDANSETRON HCL 4 MG/2ML IJ SOLN: 4.0000 mg | Freq: Four times a day (QID) | INTRAMUSCULAR | Status: DC | PRN

## 2014-09-17 MED ORDER — SODIUM CHLORIDE 0.9 % IV SOLN: INTRAVENOUS | Status: AC

## 2014-09-17 MED ORDER — ASPIRIN EC 81 MG PO TBEC: 81.0000 mg | DELAYED_RELEASE_TABLET | Freq: Every day | ORAL | Status: DC

## 2014-09-17 SURGICAL SUPPLY — 9 items
CATH INFINITI 5FR MULTPACK ANG (CATHETERS) ×3 IMPLANT
CATH SWAN GANZ 7F STRAIGHT (CATHETERS) ×3 IMPLANT
KIT HEART LEFT (KITS) ×3 IMPLANT
PACK CARDIAC CATHETERIZATION (CUSTOM PROCEDURE TRAY) ×3 IMPLANT
SHEATH PINNACLE 5F 10CM (SHEATH) ×3 IMPLANT
SHEATH PINNACLE 7F 10CM (SHEATH) ×3 IMPLANT
SYR MEDRAD MARK V 150ML (SYRINGE) ×3 IMPLANT
TRANSDUCER W/STOPCOCK (MISCELLANEOUS) ×6 IMPLANT
WIRE EMERALD 3MM-J .035X150CM (WIRE) ×3 IMPLANT

## 2014-09-17 NOTE — Progress Notes (Signed)
Patient Name: Noah Rodriguez Date of Encounter: 09/17/2014   Principal Problem:   Acute on chronic combined systolic and diastolic CHF, NYHA class 3 Active Problems:   Elevated troponin   Cardiomyopathy-etiology not yet determined   Hypertensive heart disease   Obesity, Class III, BMI 40-49.9 (morbid obesity)   AKI (acute kidney injury)   Sleep apnea   Chest pain   Abnormal cardiac function test   Non compliance w medication regimen   SUBJECTIVE  No chest pain.  Breathing stable @ rest though noted orthopnea last night.  Scheduled for cath today.  CURRENT MEDS . amLODipine  2.5 mg Oral Daily  . aspirin EC  81 mg Oral Daily  . budesonide (PULMICORT) nebulizer solution  0.25 mg Nebulization BID  . carvedilol  12.5 mg Oral BID WC  . colchicine  0.6 mg Oral BID  . enoxaparin (LOVENOX) injection  40 mg Subcutaneous Q24H  . lisinopril  10 mg Oral BID  . sodium chloride  3 mL Intravenous Q12H    OBJECTIVE  Filed Vitals:   09/16/14 2020 09/16/14 2025 09/16/14 2127 09/17/14 0427  BP:  152/90 149/98 130/90  Pulse:  88  62  Temp:  97.8 F (36.6 C)  97.8 F (36.6 C)  TempSrc:  Oral  Oral  Resp:  18  18  Height:      Weight:    307 lb 8.7 oz (139.5 kg)  SpO2: 95% 96%  98%    Intake/Output Summary (Last 24 hours) at 09/17/14 0948 Last data filed at 09/17/14 0308  Gross per 24 hour  Intake 938.34 ml  Output   1400 ml  Net -461.66 ml   Filed Weights   09/15/14 0646 09/16/14 0544 09/17/14 0427  Weight: 306 lb 11.2 oz (139.118 kg) 307 lb 15.7 oz (139.7 kg) 307 lb 8.7 oz (139.5 kg)    PHYSICAL EXAM  General: Pleasant, NAD. Neuro: Alert and oriented X 3. Moves all extremities spontaneously. Psych: Normal affect. HEENT:  Normal  Neck: Supple without bruits.  Difficult to gauge JVP 2/2 girth. Lungs:  Resp regular and unlabored, CTA. Heart: RRR, distant, no s3, s4, or murmurs. Abdomen: Soft, non-tender, non-distended, BS + x 4.  Extremities: No clubbing, cyanosis  or edema. DP/PT/Radials 2+ and equal bilaterally.  Accessory Clinical Findings  CBC  Recent Labs  09/17/14 0259  WBC 5.6  HGB 13.4  HCT 41.3  MCV 83.8  PLT 324   Basic Metabolic Panel  Recent Labs  09/16/14 0816 09/17/14 0259  NA 136 136  K 4.5 3.8  CL 101 103  CO2 26 26  GLUCOSE 111* 97  BUN 19 19  CREATININE 1.30* 1.30*  CALCIUM 9.0 8.8*   TELE  Rsr/sb, brief runs of atrial tach.  Radiology/Studies  Dg Chest 2 View  09/09/2014   CLINICAL DATA:  Mid chest and upper abdominal cramping for 1 week. Shortness of breath and nonproductive cough.  EXAM: CHEST  2 VIEW  COMPARISON:  PA and lateral chest 01/16/2014 and 11/29/2012.  FINDINGS: There is cardiomegaly and pulmonary vascular congestion. No consolidative process, pneumothorax or effusion is identified. No focal bony abnormality is seen.  IMPRESSION: No acute disease.  Cardiomegaly and pulmonary vascular congestion.   Electronically Signed   By: Drusilla Kanner M.D.   On: 09/09/2014 09:58   Dg Abd 1 View  09/09/2014   CLINICAL DATA:  Acute onset severe abdominal pain earlier today.  EXAM: Portable ABDOMEN - 1 VIEW  COMPARISON:  None.  FINDINGS: Bowel gas pattern unremarkable without evidence of obstruction or significant ileus. Moderate stool burden in the colon. No visible opaque urinary tract calculi, though the examination is less than optimal using portable technique, given the patient's body habitus.  IMPRESSION: No acute abdominal abnormality.   Electronically Signed   By: Hulan Saas M.D.   On: 09/09/2014 19:39   Nm Myocar Multi W/spect W/wall Motion / Ef  09/13/2014   CLINICAL DATA:  39 year old male with 1 week history of chest pain and shortness of breath  EXAM: MYOCARDIAL IMAGING WITH SPECT (REST AND PHARMACOLOGIC-STRESS - 2 DAY PROTOCOL)  GATED LEFT VENTRICULAR WALL MOTION STUDY  LEFT VENTRICULAR EJECTION FRACTION  TECHNIQUE: Standard myocardial SPECT imaging was performed after resting intravenous  injection of 10 mCi Tc-18m sestamibi. Subsequently, on a second day, intravenous infusion of Lexiscan was performed under the supervision of the Cardiology staff. At peak effect of the drug, 30 mCi Tc-52m sestamibi was injected intravenously and standard myocardial SPECT imaging was performed. Quantitative gated imaging was also performed to evaluate left ventricular wall motion, and estimate left ventricular ejection fraction.  COMPARISON:  Chest x-ray 09/09/2014  FINDINGS: Perfusion: Decreased radiotracer uptake in the inferior and inferolateral walls at both rest and stress. While this may be partially due to diaphragmatic attenuation, a remote infarct is favored. Additionally, there is a small focus of decreased radiotracer activity within the apical mid ventricle following stress concerning for a region of reversible ischemia.  Wall Motion: Global hypokinesis.  The left ventricle is dilated.  Left Ventricular Ejection Fraction: 281 %  End diastolic volume 208 ml  End systolic volume 26 ml  IMPRESSION: 1. Small focus of reversible ischemia in the apical mid ventricle. Additionally, there appears to be a region of prior infarct/ scarring in the inferior and inferolateral walls.  2. Global hypokinesis with marked left ventricular dilatation.  3. Left ventricular ejection fraction 26%  4. High-risk stress test findings*.  *2012 Appropriate Use Criteria for Coronary Revascularization Focused Update: J Am Coll Cardiol. 2012;59(9):857-881. http://content.dementiazones.com.aspx?articleid=1201161   Electronically Signed   By: Malachy Moan M.D.   On: 09/13/2014 13:47    ASSESSMENT AND PLAN  39 y.o. Morbidly obese AA male with a history of HTN,Cardiomyopathy, Asthma, chronic LE edema, and non-compliant with medications,  admitted 08/29 with CHF, epigastric pain, acute RI, and elevated Troponin. He has diuresed > 11L and lost 16 lbs. Myoview done was abnormal. Demonstrating ejection fraction 26% and a  possible inferior wall MI.  1. Elevated troponin in setting of atypical chest and epigastric pain - Troponin in ED elevated to 0.06. Cyclic troponins have been <0.03. - No further c/p. - Myoview abnormal (small area of apical ischemia with prior inf/inflat infarct), plan for cath today. - Cont asa, bb, acei.  Add statin if CAD present.  LDL 95.  2. Acute on chronic combined systolic and diastolic CHF - EF 30-35% w/ diff HK, Gr 1 DD by echo 09/10/14- relatively stable since 2014. - Net 11.7L since admission. - Wt down 8 lbs since admission (bed scale). - Volume looks pretty good on exam though exam complicated by body habitus. - c/o orthopnea - eval EDP today.  OSA may be playing a role. - diuretics on hold pending cath today.  3. Mild persistent asthma - No active wheezing. - Continue home medical therapy. - Metoprolol changed to Coreg- watch for asthma exacerbation.  4. Hypokalemia  - Now resolved. Aldactone added.  5. Obesity, Class III, BMI 40-49.9 (morbid obesity) -  Encourage lifestyle modifications.  6. Sleep apnea - Sleep study conducted in 05/2010  - Pt not able to tolerate mask - Stressed importance of OSA treatment.  Will need repeat sleep study as outpt.  Can f/u with Dr. Tresa Endo.  7. AKI (acute kidney injury) - Creatinine stable @ 1.3.  Lasix held x 48 hrs pre cath.  -Tolerating ACEI/Spiro - on hold for cath this AM.  8. Essential HTN -poorly controlled - BP variable but slightly better this AM. - Metoprolol changed to Coreg 12.5 mg BID (HR 50) on 9/5.   - Follow.  9. Noncompliance - Likely to continue to be a major issue impacting his care.   10. Acute Gout - In setting of diuresis. - Diuretics on hold pending cath. - Colchicine added 9/5 - slight improvement as of this AM.  Signed, Nicolasa Ducking NP

## 2014-09-17 NOTE — H&P (View-Only) (Signed)
Patient states that he will place the CPAP on himself. O2 is bled in and humidification chamber filled with sterile water. RT will continue to monitor.  

## 2014-09-17 NOTE — Progress Notes (Signed)
Site area: RFA/RFV Site Prior to Removal:  Level 0 Pressure Applied For:30 min Manual:   yes Patient Status During Pull:  stable Post Pull Site:  Level o Post Pull Instructions Given: yes  Post Pull Pulses Present: palpable Dressing Applied: clear  Bedrest begins @ 1300 Comments:

## 2014-09-17 NOTE — Interval H&P Note (Signed)
Cath Lab Visit (complete for each Cath Lab visit)  Clinical Evaluation Leading to the Procedure:   ACS: No.  Non-ACS:    Anginal Classification: CCS III  Anti-ischemic medical therapy: Maximal Therapy (2 or more classes of medications)  Non-Invasive Test Results: High-risk stress test findings: cardiac mortality >3%/year  Prior CABG: No previous CABG      History and Physical Interval Note:  09/17/2014 11:38 AM  Noah Rodriguez  has presented today for surgery, with the diagnosis of unstable angina  The various methods of treatment have been discussed with the patient and family. After consideration of risks, benefits and other options for treatment, the patient has consented to  Procedure(s): Right/Left Heart Cath and Coronary Angiography (N/A) as a surgical intervention .  The patient's history has been reviewed, patient examined, no change in status, stable for surgery.  I have reviewed the patient's chart and labs.  Questions were answered to the patient's satisfaction.     Layson Bertsch A

## 2014-09-17 NOTE — Progress Notes (Signed)
Heart Failure Navigator Consult Note  Presentation: Noah Rodriguez is a 39 y.o. male with a history of HTN, Asthma, chronic LE edema and non-compliant to medications who presented to [facility] 09/09/14 for evaluation of worsening sob and epigastric/substernal chest pain and directly admitted to [facility] for mild elevated troponin.   Patient had a sleep study done in 05/2010 for hypersomnia with sleep apnea and recommended CPAP. However, he never get CPAP machine due to cost. Echo 12/2012 showed LV Ef of 35-40%, Moderate LVH, no WM abnormality, grade 1 DD, mild dilated RV.  Patient reports he has had a cough, intermittent shortness of breath for the past 4 or 5 months. He reports that he is followed by his primary medical doctor for these problems. Felt ongoing symptoms due to non-compliance. For the past 2-3 weeks he is complaining of intermitted epigastric pain, describes as dull, resolves by itself in few minutes. Nothing makes worse or better. He is also a poor historian, unable to provide specific details, said "I just don't feel well for the past 2-3 weeks". This morning after bowel movement he had epigastric/substernal chest pain. He descries as dull and achy and presented for further evaluation. He admits to having chronic LE edema and SOB, that has been worsen recently. He dose have orthopnea. Denies PND, diarrhea, nausea, vomiting or melena. No hx of drug abuse or tobacco smoking. Grandfather has hx of pancreatic cancer and HF.    Past Medical History  Diagnosis Date  . Hypertension   . Asthma   . Sleep apnea     sleep study 05/2010  . Obesity, Class III, BMI 40-49.9 (morbid obesity)     Social History   Social History  . Marital Status: Married    Spouse Name: N/A  . Number of Children: N/A  . Years of Education: N/A   Social History Main Topics  . Smoking status: Never Smoker   . Smokeless tobacco: Never Used  . Alcohol Use: No  . Drug Use: No  . Sexual Activity:  Yes    Birth Control/ Protection: Condom   Other Topics Concern  . None   Social History Narrative    ECHO:Study Conclusions--09/10/14  - Left ventricle: False tendon in LV apex of no clinical significance. The cavity size was normal. There was moderate concentric hypertrophy. Systolic function was moderately to severely reduced. The estimated ejection fraction was in the range of 30% to 35%. Diffuse hypokinesis. There was an increased relative contribution of atrial contraction to ventricular filling. Doppler parameters are consistent with abnormal left ventricular relaxation (grade 1 diastolic dysfunction). - Aorta: Aortic root dimension: 40 mm (ED). - Aortic root: The aortic root was mildly dilated. - Mitral valve: There was trivial regurgitation. - Pericardium, extracardiac: A trivial pericardial effusion was identified posterior to the heart.  Echocardiography. M-mode, complete 2D, spectral Doppler, and color Doppler. Birthdate: Patient birthdate: 10-Feb-1975. Age: Patient is 39 yr old. Sex: Gender: male.  BMI: 48.9 kg/m^2. Blood pressure:   139/109 Patient status: Inpatient. Study date: Study date: 09/10/2014. Study time: 12:48 PM. Location: ICU/CCU  Cardiac catheterization-09/17/14  Conclusion     There is severe left ventricular systolic dysfunction.  Elevation of right heart pressures with mild pulmonary hypertension.  Left ventricular dilatation with severe global hypo-contractility and an ejection fraction of 20-25%.  Normal coronary arteries.  RECOMMENDATION:  The patient will continue to be treated with titrating doses of ace inhibition, beta blocker, amlodipine, aldosterone blockade and diuretic therapy. . Consider initial  placement of a LifeVest in light of his severely dysfunction with reassessment of LV function in several months.     BNP    Component Value Date/Time   BNP 98.0 09/09/2014 0940    ProBNP     Component Value Date/Time   PROBNP 84.0 01/16/2014 1118     Education Assessment and Provision:  Detailed education and instructions provided on heart failure disease management including the following:  Signs and symptoms of Heart Failure When to call the physician Importance of daily weights Low sodium diet Fluid restriction Medication management Anticipated future follow-up appointments  Patient education given on each of the above topics.  Patient acknowledges understanding and acceptance of all instructions.  I spoke at length with Noah Rodriguez regarding his new HF diagnosis.  He lives in [place] with his grandparents and works in [place].  He has a scale--yet has not been weighing daily.  I explained the importance of daily weights and when to call the physician related to weights as well as signs /symptoms of HF.  We discussed a low sodium diet and high sodium foods to avoid.  He asked pertinent questions and seems motivated to make changes necessary for his health.  He tells me that he does not have a CPAP at home because he cannot afford one-- I will discuss with care manager as I am not sure of options? He realizes the importance of using CPAP regularly and how it relates to his heart.  He will follow with [facility] after discharge.   Education Materials:  "Living Better With Heart Failure" Booklet, Daily Weight Tracker Tool and Heart Failure Educational Video.   High Risk Criteria for Readmission and/or Poor Patient Outcomes:   EF <30%- Yes 30%  2 or more admissions in 6 months- No  Difficult social situation- No  Demonstrates medication noncompliance- No-denies    Barriers of Care:  Knowledge and compliance  Discharge Planning:  Plans to return to home with grandparents

## 2014-09-18 DIAGNOSIS — R079 Chest pain, unspecified: Secondary | ICD-10-CM

## 2014-09-18 DIAGNOSIS — Z9114 Patient's other noncompliance with medication regimen: Secondary | ICD-10-CM

## 2014-09-18 LAB — CBC
HCT: 44.2 % (ref 39.0–52.0)
Hemoglobin: 14.8 g/dL (ref 13.0–17.0)
MCH: 27.9 pg (ref 26.0–34.0)
MCHC: 33.5 g/dL (ref 30.0–36.0)
MCV: 83.2 fL (ref 78.0–100.0)
PLATELETS: 287 10*3/uL (ref 150–400)
RBC: 5.31 MIL/uL (ref 4.22–5.81)
RDW: 13.8 % (ref 11.5–15.5)
WBC: 6 10*3/uL (ref 4.0–10.5)

## 2014-09-18 LAB — BASIC METABOLIC PANEL
Anion gap: 10 (ref 5–15)
BUN: 19 mg/dL (ref 6–20)
CO2: 24 mmol/L (ref 22–32)
Calcium: 9.3 mg/dL (ref 8.9–10.3)
Chloride: 104 mmol/L (ref 101–111)
Creatinine, Ser: 1.25 mg/dL — ABNORMAL HIGH (ref 0.61–1.24)
GFR calc Af Amer: >60 mL/min (ref >60)
GFR calc non Af Amer: >60 mL/min (ref >60)
Glucose, Bld: 89 mg/dL (ref 65–99)
Potassium: 4.8 mmol/L (ref 3.5–5.1)
Sodium: 138 mmol/L (ref 135–145)

## 2014-09-18 MED ORDER — FUROSEMIDE 10 MG/ML IJ SOLN: 40.0000 mg | Freq: Two times a day (BID) | INTRAMUSCULAR | Status: DC

## 2014-09-18 MED ORDER — FUROSEMIDE 10 MG/ML IJ SOLN
40.0000 mg | Freq: Two times a day (BID) | INTRAMUSCULAR | Status: DC
  Administered 2014-09-18 – 2014-09-19 (×3): 40 mg via INTRAVENOUS
  Filled 2014-09-18 (×3): qty 4

## 2014-09-18 MED ORDER — SPIRONOLACTONE 25 MG PO TABS
12.5000 mg | ORAL_TABLET | Freq: Every day | ORAL | Status: DC
  Administered 2014-09-18 – 2014-09-20 (×3): 12.5 mg via ORAL
  Filled 2014-09-18 (×4): qty 1

## 2014-09-18 NOTE — Progress Notes (Signed)
Pt stated he will place himself on CPAP when ready, RT informed pt to call if he needs any assistance during the night

## 2014-09-18 NOTE — Care Management Note (Signed)
Case Management Note  Patient Details  Name: Noah Rodriguez MRN: [id number] Date of Birth: 1975-07-06  Subjective/Objective:          Admitted with CHF          Action/Plan: CM talked to patient about DCP. Patient works in [place], lives with his mother in [place] Monday - Friday and goes home to [place] on the weekends. Patient stated that he has not been compliant with medication or taking care of himself due to the expense of child support ( $374 every week in child support) and his take home pay is $280 per week. CM explained the importance of his medication and that he can apply for medication assistance for some medication through their drug company for assistance. Also patient stated that with his insurance the co pay for the CPAP machine is $100 per month. CM encouraged patient to reach out to his family for assistance and how important it is for him. Patient stated that he will work out a plan to take better care of himself and to be more compliant. Life Vest ordered and faxed. CM will continue to follow for DCP.  Expected Discharge Date:   possibly 09/21/2014               Expected Discharge Plan:  Home/Self Care  Discharge planning Services  CM Consult  Post Acute Care Choice:  Durable Medical Equipment Choice offered to:     DME Arranged:  Continuous passive motion machine, Oxygen DME Agency:  [facility] Other - Comment  Status of Service:  In process, will continue to follow:  Reola Mosher [phone] 09/18/2014, 11:27 AM

## 2014-09-18 NOTE — Progress Notes (Signed)
Patient Name: Noah Rodriguez Date of Encounter: 09/18/2014     Principal Problem:   Acute on chronic combined systolic and diastolic CHF, NYHA class 3 Active Problems:   Hypertensive heart disease   Obesity, Class III, BMI 40-49.9 (morbid obesity)   Sleep apnea   AKI (acute kidney injury)   Elevated troponin   Cardiomyopathy-etiology not yet determined   Chest pain   Abnormal cardiac function test   Non compliance w medication regimen    SUBJECTIVE  Still complains of orthopnea. No other complaints.   CURRENT MEDS . amLODipine  2.5 mg Oral Daily  . aspirin EC  81 mg Oral Daily  . budesonide (PULMICORT) nebulizer solution  0.25 mg Nebulization BID  . carvedilol  12.5 mg Oral BID WC  . colchicine  0.6 mg Oral BID  . heparin  5,000 Units Subcutaneous 3 times per day  . lisinopril  10 mg Oral BID  . sodium chloride  3 mL Intravenous Q12H    OBJECTIVE  Filed Vitals:   09/18/14 0202 09/18/14 0607 09/18/14 1002 09/18/14 1041  BP: 125/66 133/97  151/99  Pulse: 86 85  71  Temp: 98.4 F (36.9 C) 97.9 F (36.6 C)  97.4 F (36.3 C)  TempSrc: Oral Oral  Oral  Resp: Height:      Weight:  308 lb 1.6 oz (139.753 kg)    SpO2: 99% 98% 98% 100%    Intake/Output Summary (Last 24 hours) at 09/18/14 1055 Last data filed at 09/18/14 0607  Gross per 24 hour  Intake    240 ml  Output   2350 ml  Net  -2110 ml   Filed Weights   09/16/14 0544 09/17/14 0427 09/18/14 0607  Weight: 307 lb 15.7 oz (139.7 kg) 307 lb 8.7 oz (139.5 kg) 308 lb 1.6 oz (139.753 kg)    PHYSICAL EXAM  General: Pleasant, NAD. obese Neuro: Alert and oriented X 3. Moves all extremities spontaneously. Psych: Normal affect. HEENT:  Normal  Neck: Supple without bruits Difficult to gauge JVP 2/2 girth. Lungs:  Resp regular and unlabored, CTA. Heart: RRR no s3, s4, or murmurs.  Abdomen: Soft, non-tender, non-distended, BS + x 4.  Extremities: No clubbing, cyanosis or edema. DP/PT/Radials  2+ and equal bilaterally.  Accessory Clinical Findings  CBC  Recent Labs  09/17/14 0259 09/18/14 0453  WBC 5.6 6.0  HGB 13.4 14.8  HCT 41.3 44.2  MCV 83.8 83.2  PLT 324 287   Basic Metabolic Panel  Recent Labs  09/17/14 0259 09/18/14 0453  NA 136 138  K 3.8 4.8  CL 103 104  CO2 26 24  GLUCOSE 97 89  BUN 19 19  CREATININE 1.30* 1.25*  CALCIUM 8.8* 9.3   TELE NSR with PVCs  Radiology/Studies  Dg Chest 2 View  09/09/2014   CLINICAL DATA:  Mid chest and upper abdominal cramping for 1 week. Shortness of breath and nonproductive cough.  EXAM: CHEST  2 VIEW  COMPARISON:  PA and lateral chest 01/16/2014 and 11/29/2012.  FINDINGS: There is cardiomegaly and pulmonary vascular congestion. No consolidative process, pneumothorax or effusion is identified. No focal bony abnormality is seen.  IMPRESSION: No acute disease.  Cardiomegaly and pulmonary vascular congestion.   Electronically Signed   By: Drusilla Kanner M.D.   On: 09/09/2014 09:58   Dg Abd 1 View  09/09/2014   CLINICAL DATA:  Acute onset severe abdominal pain earlier today.  EXAM: Portable ABDOMEN - 1 VIEW  COMPARISON:  None.  FINDINGS: Bowel gas pattern unremarkable without evidence of obstruction or significant ileus. Moderate stool burden in the colon. No visible opaque urinary tract calculi, though the examination is less than optimal using portable technique, given the patient's body habitus.  IMPRESSION: No acute abdominal abnormality.   Electronically Signed   By: Hulan Saas M.D.   On: 09/09/2014 19:39   Nm Myocar Multi W/spect W/wall Motion / Ef  09/13/2014   CLINICAL DATA:  39 year old male with 1 week history of chest pain and shortness of breath  EXAM: MYOCARDIAL IMAGING WITH SPECT (REST AND PHARMACOLOGIC-STRESS - 2 DAY PROTOCOL)  GATED LEFT VENTRICULAR WALL MOTION STUDY  LEFT VENTRICULAR EJECTION FRACTION  TECHNIQUE: Standard myocardial SPECT imaging was performed after resting intravenous injection of 10 mCi  Tc-76m sestamibi. Subsequently, on a second day, intravenous infusion of Lexiscan was performed under the supervision of the Cardiology staff. At peak effect of the drug, 30 mCi Tc-31m sestamibi was injected intravenously and standard myocardial SPECT imaging was performed. Quantitative gated imaging was also performed to evaluate left ventricular wall motion, and estimate left ventricular ejection fraction.  COMPARISON:  Chest x-ray 09/09/2014  FINDINGS: Perfusion: Decreased radiotracer uptake in the inferior and inferolateral walls at both rest and stress. While this may be partially due to diaphragmatic attenuation, a remote infarct is favored. Additionally, there is a small focus of decreased radiotracer activity within the apical mid ventricle following stress concerning for a region of reversible ischemia.  Wall Motion: Global hypokinesis.  The left ventricle is dilated.  Left Ventricular Ejection Fraction: 281 %  End diastolic volume 208 ml  End systolic volume 26 ml  IMPRESSION: 1. Small focus of reversible ischemia in the apical mid ventricle. Additionally, there appears to be a region of prior infarct/ scarring in the inferior and inferolateral walls.  2. Global hypokinesis with marked left ventricular dilatation.  3. Left ventricular ejection fraction 26%  4. High-risk stress test findings*.  *2012 Appropriate Use Criteria for Coronary Revascularization Focused Update: J Am Coll Cardiol. 2012;59(9):857-881. http://content.dementiazones.com.aspx?articleid=1201161   Electronically Signed   By: Malachy Moan M.D.   On: 09/13/2014 13:47   09/17/14 Conclusion     There is severe left ventricular systolic dysfunction. Elevation of right heart pressures with mild pulmonary hypertension. Left ventricular dilatation with severe global hypo-contractility and an ejection fraction of 20-25%. Normal coronary arteries. RECOMMENDATION: The patient will continue to be treated with titrating doses of  ace inhibition, beta blocker, amlodipine, aldosterone blockade and diuretic therapy. Consider initial placement of a LifeVest in light of his severely dysfunction with reassessment of LV function in several months.  Left Ventricle The left ventricle is enlarged. There is severe left ventricular systolic dysfunction. The left ventricular ejection fraction is less than 25% by visual estimate. There are wall motion abnormalities in the left ventricle. Dilated LV with global hypokinesis; EF 20 - 25% Hemo Data       Most Recent Value   Fick Cardiac Output  6.12 L/min   Fick Cardiac Output Index  2.52 (L/min)/BSA   Thermal Cardiac Output  7.19 L/min   Thermal Cardiac Output Index  2.96 (L/min)/BSA   Aortic Mean Gradient  4.8 mmHg   Aortic Peak Gradient  0 mmHg   Aortic Valve Area  >3.50   Aortic Value Area Index  1.44 cm2/BSA   Mitral Mean Gradient  2.5 mmHg   Mitral Peak Gradient  0 mmHg   Mitral Valve Area Index  1.44 cm2/BSA  RA A Wave  14 mmHg   RA V Wave  12 mmHg   RA Mean  11 mmHg   RV Systolic Pressure  38 mmHg   RV Diastolic Pressure  5 mmHg   RV EDP  10 mmHg   PA Systolic Pressure  43 mmHg   PA Diastolic Pressure  19 mmHg   PA Mean  31 mmHg   PW A Wave  23 mmHg   PW V Wave  18 mmHg   PW Mean  16 mmHg   AO Systolic Pressure  162 mmHg   AO Diastolic Pressure  120 mmHg   AO Mean  139 mmHg   LV Systolic Pressure  141 mmHg   LV Diastolic Pressure  19 mmHg   LV EDP  40 mmHg   Arterial Occlusion Pressure Extended Systolic Pressure  154 mmHg   Arterial Occlusion Pressure Extended Diastolic Pressure  103 mmHg   Arterial Occlusion Pressure Extended Mean Pressure  122 mmHg   Left Ventricular Apex Extended Systolic Pressure  135 mmHg   Left Ventricular Apex Extended Diastolic Pressure  14 mmHg   Left Ventricular Apex Extended EDP Pressure  29 mmHg   TPVR Index  8.79 HRUI   TSVR Index  38.52 HRUI   PVR SVR Ratio  0.05   TPVR/TSVR Ratio   0.23       ASSESSMENT AND PLAN 39 y.o. Morbidly obese AA male with a history of HTN,Cardiomyopathy, Asthma, chronic LE edema, and non-compliant with medications, admitted 08/29 with CHF, epigastric pain, acute RI, and elevated Troponin. He has diuresed > 11L and lost 16 lbs. Myoview done was abnormal. Demonstrating ejection fraction 26% and a possible inferior wall MI. LHC with normal coronaries and elevated filling pressures.   1. Elevated troponin in setting of atypical chest and epigastric pain - Troponin in ED elevated to 0.06. Cyclic troponins have been <0.03. - No further c/p. - Myoview abnormal (small area of apical ischemia with prior inf/inflat infarct). He underwent L/RHC yesterday which revealed normal coronary arteries, elevation of right heart pressures with mild pulmonary hypertension and LV dilatation with severe global hypo-contractility and an ejection fraction of 20-25%. - Cont ASA, bb, acei.   2. Acute on chronic combined systolic and diastolic CHF - EF 30-35% w/ diff HK, Gr 1 DD by echo 09/10/14- relatively stable since 2014. - Net 13.8L since admission and Wt down 12 lbs since admission, but up from a few days ago (305--> 308 lbs) - Volume looks pretty good on exam though exam complicated by body habitus. - c/o orthopnea. LV EDP 40 and wedge pressure 16 mm HG. - diuretics held for cath. His filling pressures are elevated on RHC, will require additional diuresis w/ IV Lasix today before discharge home.  -- There was discussion about LifeVest but this is not indicated at this time with new onset NICM and no ventricular arrhythmias documented on tele  3. Mild persistent asthma - No active wheezing. - Continue home medical therapy. - Metoprolol changed to Coreg- watch for asthma exacerbation.  4. Hypokalemia  - Now resolved. Aldactone added.  5. Obesity, Class III, BMI 40-49.9 (morbid obesity) - Encourage lifestyle modifications.  6. Sleep apnea - Sleep study  conducted in 05/2010  - Pt not able to tolerate mask - Stressed importance of OSA treatment. Will need repeat sleep study as outpt. Can f/u with Dr. Tresa Endo.  7. AKI (acute kidney injury) - Creatinine stable @ 1.25. Lasix held x 48 hrs pre cath.  -  Tolerating ACEI/Spiro   8. Essential HTN -poorly controlled - BP variable but slightly better this AM. - Metoprolol changed to Coreg 12.5 mg BID (HR 50) on 9/5.  - Follow.  9. Noncompliance - Likely to continue to be a major issue impacting his care.   10. Acute Gout - In setting of diuresis. - Diuretics held for cath. - Colchicine added 9/5 - slight improvement as of this AM.  Signed, Janetta Hora PA-C  Pager [phone]

## 2014-09-18 NOTE — Evaluation (Addendum)
Occupational Therapy Evaluation Patient Details Name: Edder Bellanca MRN: [id number] DOB: Mar 28, 1975 Today's Date: 09/18/2014    History of Present Illness 39 y.o. male with a history of HTN, Asthma, chronic LE edema and non-compliant to medications who presented to [facility] 09/09/14 for evaluation of worsening sob and epigastric/substernal chest pain and directly admitted to [facility] for mild elevated troponin. Dx of CHF, new onset of gout L foot.    Clinical Impression   Pt admitted with above. Education provided in session and OT signing off. Pt verbalized understanding of information covered.     Follow Up Recommendations  No OT follow up    Equipment Recommendations  None recommended by OT    Recommendations for Other Services       Precautions / Restrictions  Precaution Comments: pt denies h/o falls Restrictions Weight Bearing Restrictions: No      Mobility Bed Mobility           General bed mobility comments: not assessed  Transfers Overall transfer level: Modified independent                    Balance Pt unsteady on feet-did not use walker and holding onto things when ambulating.                                          ADL Overall ADL's : Needs assistance/impaired                     Lower Body Dressing: Set up;Sit to/from stand   Toilet Transfer: Supervision/safety;Ambulation (sit to stand from bed )       Tub/ Shower Transfer: Tub transfer;Min guard;Ambulation   Functional mobility during ADLs: Supervision/safety General ADL Comments: Educated on energy conservation. Educated on safety such as rugs/items on floor, sitting for LB ADLs and safe footwear. Discussed options for shower chair or discussed sitting on edge of tub.  Pt did not seem interested in shower chair.  Recommended someone be with him for tub transfer. Gave pt energy conservation handout. Explained how walker may help him with  ambulation.     Vision     Perception     Praxis      Pertinent Vitals/Pain Pain Assessment: 0-10 Pain Score: 8  Pain Location: left foot Pain Descriptors / Indicators: Throbbing Pain Intervention(s): Monitored during session; limited activity due to pt's pain HR stable in session.     Hand Dominance     Extremity/Trunk Assessment Upper Extremity Assessment Upper Extremity Assessment: Overall WFL for tasks assessed   Lower Extremity Assessment Lower Extremity Assessment: Defer to PT evaluation LLE Deficits / Details: knee/hip WNL, unable to DF/PF foot actively due to gout pain LLE: Unable to fully assess due to pain   Cervical / Trunk Assessment Cervical / Trunk Assessment: Normal   Communication Communication Communication: No difficulties   Cognition Arousal/Alertness: Awake/alert Behavior During Therapy: WFL for tasks assessed/performed Overall Cognitive Status: Within Functional Limits for tasks assessed                     General Comments       Exercises       Shoulder Instructions      Home Living Family/patient expects to be discharged to:: Private residence Living Arrangements: Spouse/significant other Available Help at Discharge: Other (Comment) (significant other)   Home Access:  Level entry     Home Layout: One level     Bathroom Shower/Tub: Chief Strategy Officer: Standard     Home Equipment: None          Prior Functioning/Environment Level of Independence: Independent        Comments: works as Chartered certified accountant, was "very active" PTA    OT Diagnosis: Acute pain   OT Problem List:     OT Treatment/Interventions:      OT Goals(Current goals can be found in the care plan section) Acute Rehab OT Goals 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

## 2014-09-18 NOTE — Evaluation (Signed)
Physical Therapy Evaluation Patient Details Name: Noah Rodriguez MRN: [id number] DOB: 15-Mar-1975 Today's Date: 09/18/2014   History of Present Illness  39 y.o. male with a history of HTN, Asthma, chronic LE edema and non-compliant to medications who presented to [facility] 09/09/14 for evaluation of worsening sob and epigastric/substernal chest pain and directly admitted to [facility] for mild elevated troponin. Dx of CHF, new onset of gout L foot.   Clinical Impression  Pt admitted with above diagnosis. Pt currently with functional limitations due to the deficits listed below (see PT Problem List). Pt ambulated 47' with RW, distance limited by L foot gout pain. Will try cane next visit per pt request. Return to independence with mobility once gout pain resolves is expected.  Pt will benefit from skilled PT to increase their independence and safety with mobility to allow discharge to the venue listed below.       Follow Up Recommendations No PT follow up    Equipment Recommendations  Cane    Recommendations for Other Services       Precautions / Restrictions Precautions Precautions: None Precaution Comments: pt denies h/o falls Restrictions Weight Bearing Restrictions: No      Mobility  Bed Mobility Overal bed mobility: Independent                Transfers Overall transfer level: Independent                  Ambulation/Gait Ambulation/Gait assistance: Modified independent (Device/Increase time) Ambulation Distance (Feet): 35 Feet Assistive device: Rolling walker (2 wheeled)     Gait velocity interpretation: Below normal speed for age/gender General Gait Details: pt stated he's been limping to walk to bathroom, trial of RW provided some pain relief with WB on LLE however pt would prefer to try a cane; gait distance limited by L foot gout pain  Stairs            Wheelchair Mobility    Modified Rankin (Stroke Patients Only)       Balance  Overall balance assessment: Modified Independent                                           Pertinent Vitals/Pain Pain Assessment: 0-10 Pain Score: 8  Pain Location: L foot at 1st metatarsal head -gout pain, with walking Pain Intervention(s): Limited activity within patient's tolerance;Monitored during session (pt declined pain medication)    Home Living Family/patient expects to be discharged to:: Private residence Living Arrangements: Spouse/significant other     Home Access: Level entry     Home Layout: One level Home Equipment: None      Prior Function Level of Independence: Independent         Comments: works as Chartered certified accountant, 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        Extremity/Trunk Assessment   Upper Extremity Assessment: Overall WFL for tasks assessed           Lower Extremity Assessment: LLE deficits/detail   LLE Deficits / Details: knee/hip WNL, unable to DF/PF foot actively due to gout pain  Cervical / Trunk Assessment: Normal  Communication   Communication: No difficulties  Cognition Arousal/Alertness: Awake/alert Behavior During Therapy: WFL for tasks assessed/performed Overall Cognitive Status: Within Functional Limits for tasks assessed  General Comments      Exercises        Assessment/Plan    PT Assessment Patient needs continued PT services  PT Diagnosis Acute pain;Difficulty walking   PT Problem List Pain;Decreased knowledge of use of DME;Decreased mobility  PT Treatment Interventions DME instruction;Gait training;Patient/family education;Therapeutic exercise   PT Goals (Current goals can be found in the Care Plan section) Acute Rehab PT Goals 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

## 2014-09-19 DIAGNOSIS — R7989 Other specified abnormal findings of blood chemistry: Secondary | ICD-10-CM

## 2014-09-19 DIAGNOSIS — R778 Other specified abnormalities of plasma proteins: Secondary | ICD-10-CM | POA: Insufficient documentation

## 2014-09-19 DIAGNOSIS — N179 Acute kidney failure, unspecified: Secondary | ICD-10-CM

## 2014-09-19 LAB — BASIC METABOLIC PANEL
Anion gap: 8 (ref 5–15)
BUN: 23 mg/dL — ABNORMAL HIGH (ref 6–20)
CO2: 25 mmol/L (ref 22–32)
Calcium: 8.9 mg/dL (ref 8.9–10.3)
Chloride: 102 mmol/L (ref 101–111)
Creatinine, Ser: 1.46 mg/dL — ABNORMAL HIGH (ref 0.61–1.24)
GFR calc Af Amer: >60 mL/min (ref >60)
GFR calc non Af Amer: 59 mL/min — ABNORMAL LOW (ref >60)
Glucose, Bld: 108 mg/dL — ABNORMAL HIGH (ref 65–99)
Potassium: 4.1 mmol/L (ref 3.5–5.1)
Sodium: 135 mmol/L (ref 135–145)

## 2014-09-19 MED ORDER — FUROSEMIDE 40 MG PO TABS
40.0000 mg | ORAL_TABLET | Freq: Two times a day (BID) | ORAL | Status: DC
  Administered 2014-09-19 – 2014-09-20 (×2): 40 mg via ORAL
  Filled 2014-09-19 (×2): qty 1

## 2014-09-19 NOTE — Progress Notes (Signed)
Pt expressed to case manager that know one has been telling him anything about his diagnosis and that Hes not happy with the doctors here. He has not expressed any of the concerns to me and i have been taking care of him all week.  Dr. Rennis Golden was paged. Pt spoke with doctor while Jiles Crocker and myself were in the room.  Patient's girlfriend was also at the bedside.  Pt seemed to be okay after speaking to Dr. Rennis Golden.  Not sure if patient is not handling the diagnosis very well or what may be going on. As for me I have not experienced problems or issues with patient.

## 2014-09-19 NOTE — Progress Notes (Signed)
Pt educated on limiting fluid intake and blood pressure medicines tonight, Pt states the doctors have never told him that he has "Heart Failure". Pt states he wishes to talk to the MD about his diagnosis and his blood pressure medicines. Pt states he is unsure what the doctors are doing for him here. Pt updated on plan of care. Pt encouraged to express his concerns with the MD in the AM.

## 2014-09-19 NOTE — Progress Notes (Addendum)
CPAP is set up for pt use, pt stated he will place self on when ready. Informed pt to call for RT if he needs any help during the night

## 2014-09-19 NOTE — Progress Notes (Signed)
Pt HR down to 40 on monitor. Pt states he felt short of breath briefly when lying in bed. HR now 80-90 SR on monitor. VSS otherwise. Leeann Must, MD notified

## 2014-09-19 NOTE — Progress Notes (Signed)
Patient Name: Noah Rodriguez Date of Encounter: 09/19/2014  Principal Problem:   Acute on chronic combined systolic and diastolic CHF, NYHA class 3 Active Problems:   Hypertensive heart disease   Obesity, Class III, BMI 40-49.9 (morbid obesity)   Sleep apnea   AKI (acute kidney injury)   Elevated troponin   Cardiomyopathy-etiology not yet determined   Chest pain   Abnormal cardiac function test   Non compliance w medication regimen   Pain in the chest     Primary Cardiologist: New - Dr. Tresa Endo Patient Profile: 40 y.o. Morbidly obese AA male w/ PMH of HTN,Cardiomyopathy, Asthma, chronic LE edema, and non-compliant with medications, admitted 08/29 with CHF, epigastric pain, acute RI, and elevated Troponin. He has diuresed > 15L. Myoview done was abnormal. Demonstrating ejection fraction 26% and a possible inferior wall MI. LHC with normal coronaries and elevated filling pressures.   SUBJECTIVE: Reports having shortness of breath with activity. Has not been walking in the hallway due to his toe pain associated with recently diagnosed gout. Denies any chest pain or palpitations.  Patient is curious about when he will be able to return to work. 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   OBJECTIVE Filed Vitals:   09/18/14 2103 09/19/14 0204 09/19/14 0617 09/19/14 0931  BP:  148/98 135/90   Pulse:  84 73   Temp:   98.5 F (36.9 C)   TempSrc:   Oral   Resp:   20   Height:      Weight:   305 lb (138.347 kg)   SpO2: 100% 97% 100% 97%    Intake/Output Summary (Last 24 hours) at 09/19/14 1231 Last data filed at 09/19/14 1100  Gross per 24 hour  Intake    720 ml  Output   3000 ml  Net  -2280 ml   Filed Weights   09/17/14 0427 09/18/14 0607 09/19/14 0617  Weight: 307 lb 8.7 oz (139.5 kg) 308 lb 1.6 oz (139.753 kg) 305 lb (138.347 kg)    PHYSICAL EXAM General: Obese, African  American, male in no acute distress. Head: Normocephalic, atraumatic.  Neck: Supple without bruits, Unable to assess JVD due to body habitus. Lungs:  Resp regular and unlabored, CTA without wheezing or rales. Heart: RRR, S1, S2, no S3, S4, or murmur; no rub. Abdomen: Soft, non-tender, non-distended with normoactive bowel sounds. No hepatomegaly. No rebound/guarding. No obvious abdominal masses. Extremities: No clubbing or cyanosis, trace edema. Distal pedal pulses are 2+ bilaterally. Neuro: Alert and oriented X 3. Moves all extremities spontaneously. Psych: Normal affect.   LABS: CBC: Recent Labs  09/17/14 0259 09/18/14 0453  WBC 5.6 6.0  HGB 13.4 14.8  HCT 41.3 44.2  MCV 83.8 83.2  PLT 324 287   INR: Recent Labs  09/17/14 0259  INR 1.11   Basic Metabolic Panel: Recent Labs  09/18/14 0453 09/19/14 0410  NA 138 135  K 4.8 4.1  CL 104 102  CO2 24 25  GLUCOSE 89 108*  BUN 19 23*  CREATININE 1.25* 1.46*  CALCIUM 9.3 8.9   BNP:  B NATRIURETIC PEPTIDE  Date/Time Value Ref Range Status  09/09/2014 09:40 AM 98.0 0.0 - 100.0 pg/mL Final   TELE:  SR with rates mostly in  60's- 70's. Episodes of bradycardia overnight with rate in 40's - 50's. No atopic events.       ECHO: 09/10/2014 Study  Conclusions - Left ventricle: False tendon in LV apex of no clinical significance. The cavity size was normal. There was moderate concentric hypertrophy. Systolic function was moderately to severely reduced. The estimated ejection fraction was in the range of 30% to 35%. Diffuse hypokinesis. There was an increased relative contribution of atrial contraction to ventricular filling. Doppler parameters are consistent with abnormal left ventricular relaxation (grade 1 diastolic dysfunction). - Aorta: Aortic root dimension: 40 mm (ED). - Aortic root: The aortic root was mildly dilated. - Mitral valve: There was trivial regurgitation. - Pericardium, extracardiac: A  trivial pericardial effusion was identified posterior to the heart.  Cardiac Catheterization: The left ventricle is enlarged. There is severe left ventricular systolic dysfunction. The left ventricular ejection fraction is less than 25% by visual estimate. There are wall motion abnormalities in the left ventricle. Dilated LV with global hypokinesis; EF 20 - 25%  Elevation of right heart pressures with mild pulmonary hypertension.  Left ventricular dilatation with severe global hypo-contractility and an ejection fraction of 20-25%.  Normal coronary arteries.  RECOMMENDATION: The patient will continue to be treated with titrating doses of ace inhibition, beta blocker, amlodipine, aldosterone blockade and diuretic therapy. Consider initial placement of a LifeVest in light of his severely dysfunction with reassessment of LV function in several months.   Current Medications:  . amLODipine  2.5 mg Oral Daily  . aspirin EC  81 mg Oral Daily  . budesonide (PULMICORT) nebulizer solution  0.25 mg Nebulization BID  . carvedilol  12.5 mg Oral BID WC  . colchicine  0.6 mg Oral BID  . furosemide  40 mg Intravenous BID  . heparin  5,000 Units Subcutaneous 3 times per day  . lisinopril  10 mg Oral BID  . sodium chloride  3 mL Intravenous Q12H  . spironolactone  12.5 mg Oral Daily      ASSESSMENT AND PLAN: 1. Elevated troponin in setting of atypical chest and epigastric pain - Troponin in ED elevated to 0.06. Cyclic troponins have been <0.03. No further c/p. - Myoview abnormal (small area of apical ischemia with prior inf/inflat infarct). He underwent L/RHC on 09/17/2014 which revealed normal coronary arteries, elevation of right heart pressures with mild pulmonary hypertension and LV dilatation with severe global hypo-contractility and an ejection fraction of 20-25%. - Cont ASA, BB, ACE-I.   2. Acute on chronic combined systolic and diastolic CHF - Net - 15.8L since admission and weight  today on 09/19/14 is 305lbs, down from 308lbs yesterday. Was 320lbs on admission. -  His filling pressures were elevated on RHC, and he is currently receiving Lasix  IV BID for diuresis. -  There was discussion about LifeVest but this is not indicated at this time with new onset NICM.  3. Mild persistent asthma - No active wheezing. - Continue home medical therapy.  4. Hypokalemia  - Now resolved. Potassium at 4.1 on 09/19/2014.   5. Obesity, Class III, BMI 40-49.9 (morbid obesity) - Encourage lifestyle modifications.  6. Sleep apnea - Sleep study conducted in 05/2010  - Pt not able to tolerate mask - Stressed importance of OSA treatment. Will need repeat sleep study as outpt.   7. AKI (acute kidney injury) - Creatinine at 1.46 on 09/19/2014.Up from baseline of around 1.4.  8. Essential HTN  - BP has been 115/78 - 148/98 in the past 24 hours.  - Continue current medical therapy.  9. Noncompliance - Likely to continue to be a major issue impacting his care.  10. Acute Gout - In setting of diuresis. - Colchicine added 09/16/2014  Signed, Wandra Mannan , PA-C 12:31 PM 09/19/2014 Pager: [phone]

## 2014-09-19 NOTE — Progress Notes (Addendum)
Patient very upset stated that "no one is telling me anything, I don't know what is wrong with me. I was supposed to have a Vest but they changed their mind mind and no one told me why." Dr Rennis Golden called and updated - he spoke to the patient on speaker phone with his girlfriend, Diplomatic Services operational officer and CM present. Patient understands what is going on and is a little tearful. Emotional support offered. Abelino Derrick [facility] [phone]

## 2014-09-20 ENCOUNTER — Telehealth: Payer: Self-pay | Admitting: Physician Assistant

## 2014-09-20 ENCOUNTER — Encounter (HOSPITAL_COMMUNITY): Payer: Self-pay | Admitting: Student

## 2014-09-20 DIAGNOSIS — M1000 Idiopathic gout, unspecified site: Secondary | ICD-10-CM

## 2014-09-20 DIAGNOSIS — I5043 Acute on chronic combined systolic (congestive) and diastolic (congestive) heart failure: Secondary | ICD-10-CM

## 2014-09-20 LAB — BASIC METABOLIC PANEL
Anion gap: 10 (ref 5–15)
BUN: 22 mg/dL — ABNORMAL HIGH (ref 6–20)
CO2: 24 mmol/L (ref 22–32)
Calcium: 8.8 mg/dL — ABNORMAL LOW (ref 8.9–10.3)
Chloride: 101 mmol/L (ref 101–111)
Creatinine, Ser: 1.5 mg/dL — ABNORMAL HIGH (ref 0.61–1.24)
GFR calc Af Amer: >60 mL/min (ref >60)
GFR calc non Af Amer: 57 mL/min — ABNORMAL LOW (ref >60)
Glucose, Bld: 101 mg/dL — ABNORMAL HIGH (ref 65–99)
Potassium: 4.1 mmol/L (ref 3.5–5.1)
Sodium: 135 mmol/L (ref 135–145)

## 2014-09-20 MED ORDER — LISINOPRIL 10 MG PO TABS: 10.0000 mg | ORAL_TABLET | Freq: Two times a day (BID) | ORAL | Status: DC

## 2014-09-20 MED ORDER — SPIRONOLACTONE 25 MG PO TABS: 12.5000 mg | ORAL_TABLET | Freq: Every day | ORAL | Status: DC

## 2014-09-20 MED ORDER — ASPIRIN 81 MG PO TBEC: 81.0000 mg | DELAYED_RELEASE_TABLET | Freq: Every day | ORAL | Status: DC

## 2014-09-20 MED ORDER — AMLODIPINE BESYLATE 2.5 MG PO TABS: 2.5000 mg | ORAL_TABLET | Freq: Every day | ORAL | Status: DC

## 2014-09-20 MED ORDER — COLCHICINE 0.6 MG PO TABS: 0.6000 mg | ORAL_TABLET | Freq: Two times a day (BID) | ORAL | Status: DC

## 2014-09-20 MED ORDER — FUROSEMIDE 40 MG PO TABS: 40.0000 mg | ORAL_TABLET | Freq: Two times a day (BID) | ORAL | Status: DC

## 2014-09-20 MED ORDER — CARVEDILOL 12.5 MG PO TABS: 12.5000 mg | ORAL_TABLET | Freq: Two times a day (BID) | ORAL | Status: DC

## 2014-09-20 NOTE — Progress Notes (Signed)
Physical Therapy Treatment Patient Details Name: Noah Rodriguez MRN: [id number] DOB: 01-Feb-1975 Today's Date: 09/20/2014    History of Present Illness 39 y.o. male with a history of HTN, Asthma, chronic LE edema and non-compliant to medications who presented to [facility] 09/09/14 for evaluation of worsening sob and epigastric/substernal chest pain and directly admitted to [facility] for mild elevated troponin. Dx of CHF, new onset of gout L foot.     PT Comments    Patient in bed, eventually agreeable to participate in PT today. 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 Patient was able to ambulate and transfer as described below. Repeatedly denied use of cane even though he was educated on use of AD for increased ambulation instead of wall/sink/railings support. Performed ankle AROM exercises as well as toe exercises to improve ROM due to painful limitations of gout. Patient will benefit from continued PT to increase safety with ambulation and improve endurance.   Follow Up Recommendations  No PT follow up     Equipment Recommendations  None recommended by PT (Patient refuses use of AD)    Recommendations for Other Services       Precautions / Restrictions Precautions Precautions: None Restrictions Weight Bearing Restrictions: No    Mobility  Bed Mobility Overal bed mobility: Independent                Transfers Overall transfer level: Independent                  Ambulation/Gait Ambulation/Gait assistance: Supervision Ambulation Distance (Feet): 45 Feet Assistive device: None Gait Pattern/deviations: Antalgic;Step-to pattern;Decreased step length - right;Decreased stance time - left;Decreased dorsiflexion - left;Decreased weight shift to left;Staggering right   Gait velocity interpretation: Below normal speed for age/gender General Gait Details: Patient reports he has been up to the bathroom throughout  night. Says pain and motion has been improved through medication. Denied SOB, O2 sats 100% after neb treatment. Repeatedly denied use of cane but was holding onto whatever support he could find on the walls/sink/railings. Limited by L foot gout pain.   Stairs            Wheelchair Mobility    Modified Rankin (Stroke Patients Only)       Balance Overall balance assessment: Independent                                  Cognition Arousal/Alertness: Lethargic Behavior During Therapy: WFL for tasks assessed/performed Overall Cognitive Status: Within Functional Limits for tasks assessed                      Exercises General Exercises - Lower Extremity Ankle Circles/Pumps: AROM;Left;20 reps;Seated    General Comments General comments (skin integrity, edema, etc.): Independent with all activity aside from gait, limited by L foot gout pain.      Pertinent Vitals/Pain Pain Assessment: 0-10 Pain Score: 6  Pain Location: L foot great toe, pain increased with walking Pain Descriptors / Indicators: Discomfort;Guarding;Squeezing;Tightness Pain Intervention(s): Limited activity within patient's tolerance;Monitored during session  RT finished neb treatment before PT arrival, patient's sats 100% on RA, BP 120/67.    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, drilling PT Goal Formulation: With patient Time For Goal Achievement: 10/02/14 Potential to Achieve Goals: Good Progress towards PT goals: Progressing toward goals    Frequency  Min 3X/week    PT Plan Current plan remains appropriate    Co-evaluation             End of Session   Activity Tolerance: Patient limited by pain Patient left: in bed;with call bell/phone within reach     Time: 0837-0852 PT Time Calculation (min)  (ACUTE ONLY): 15 min  Charges:  $Gait Training: 8-22 mins                    G CodesMichele Rockers, SPT [phone] 09/20/2014, 10:12 AM  I have read, reviewed and agree with student's note.   [facility] Acute Rehabilitation [phone] (pager)

## 2014-09-20 NOTE — Discharge Summary (Signed)
CARDIOLOGY DISCHARGE SUMMARY   Patient ID: Noah Rodriguez MRN: [id number] DOB/AGE: July 28, 1975 39 y.o.  Admit date: 09/09/2014 Discharge date: 09/20/2014  PCP: Sanda Linger, MD Primary Cardiologist: New - Dr. Tresa Endo  Primary Discharge Diagnosis: Acute on chronic combined systolic and diastolic CHF, NYHA class 3 Secondary Discharge Diagnosis: Hypertensive heart disease, Obesity, Class III, BMI 40-49.9 (morbid obesity), Sleep apnea, AKI (acute kidney injury), Elevated troponin, Cardiomyopathy-etiology not yet determined, Chest pain, Abnormal cardiac function test, Non compliance w medication regimen, Elevated troponin I level  Consults: Case Management  Procedures: Echocardiogram, Right Heart Catheterization, Left Heart Catheterization, Coronary Angiography  Hospital Course: Noah Rodriguez is a 39 y.o. male with a history of HTN, Asthma, Chronic Systolic and Diastolic CHF, Medication Non-compliance, Obesity, OSA who presented to [facility] on 09/09/2014 for a one week history of chest pain, abdominal pain, and shortness of breath. Troponin initially elevated to 0.06 and T-wave inversion inferior and laterally noted on EKG. Given ASA and 3 SL NTG while in ED and transferred to [facility] for management of NSTEMI.  Cyclic troponins remained negative. An echocardiogram performed on 09/10/2014 showed EF of 30% to 35%, diffuse hypokinesis, and grade 1 diastolic dysfunction. He was started on Lasix 40mg  IV BID.  He underwent a 2-day nuclear medicine study to assess potential causes of his chest pain and initial elevated troponin. The study was completed on 09/13/2014 and showed a small focus of reversible ischemia in the apical mid ventricle, a prior infarct/ scarring in the inferior and inferolateral walls, global hypokinesis with marked left ventricular dilatation, EF of 26% and was labeled as a high-risk stress test findings. A cardiac catheterization was scheduled for  09/17/2014.  Of note, on 09/16/2014, he developed pain in his left great toe and was diagnosed with acute gout. Colchicine 0.6mg  BID was started and continued throughout admission.  The patient was educated on the risks and benefits of cardiac catheterization and he agreed to proceed with the procedure on 09/17/2014. Lasix and Lisinopril were held in preparation for the cath due to elevated creatinine. The procedure was performed without any complications. Cath showed severe left ventricular systolic dysfunction with EF < 25%, elevated right heart pressures with mild pulmonary hypertension, and normal coronary arteries. It was recommended he continue treatment with ACE-I, BB, Amlodipine, Aldosteroneblockade and diuretic therapy.It was also recommended to consider a  LifeVest. The recommendation of a LifeVest was later changed due to literature showing it was indicated for mortality benefit in NICM.   He received 40mg  IV Lasix BID following the procedure due to elevated filling pressures. He was switched to 40mg  PO Lasix on 09/19/2014. He had a net output of 15.8L during his hospitalization and weight declined 17lbs.Throughout the hospitalization he was also educated on the importance of daily weights, sodium restriction, and medication compliance.   In regards to medications, he had initially been placed on Metoprolol but this was switched to Carvedilol 12.5mg  BID on 09/15/2014 due to LV dysfunction. Aldactone 12.5mg  daily also started on 09/15/2014. Lisinopril was started at 5mg  daily and titrated up to 10mg  BID. Amlodipine remained the same at 2.5mg  daily.  In addition we will continue ASA, Lasix 40mg  BID, and Colchicine (for acute gout) at discharge.  In regards to patient's OSA, he had previous sleep study in 2012 and CPAP was recommended at that time. However, he never pursued getting a CPAP due to the cost of the machine. While in the hospital, he was on CPAP and used  the machine off and on due to "feeling  like the mask was smothering him", although he was encouraged to use it regularly. He will need another outpatient sleep study (since previous one is 39 years old) and likely need a CPAP machine if he remains  compliant with this plan.  Patient was last examined by Dr. Rennis Golden and deemed stable for discharge. Will also need a repeat Echocardiogram in 3-6 months to see if LV function has improved or if an AICD is needed. TCM appointment has been made on 09/27/14 with [name] and the patient will need a BMET and Uric Acid Level checked at that time. Will also need for an outpatient sleep study to be arranged.    Labs:   Lab Results  Component Value Date   WBC 6.0 09/18/2014   HGB 14.8 09/18/2014   HCT 44.2 09/18/2014   MCV 83.2 09/18/2014   PLT 287 09/18/2014     Recent Labs Lab 09/20/14 0611  NA 135  K 4.1  CL 101  CO2 24  BUN 22*  CREATININE 1.50*  CALCIUM 8.8*  GLUCOSE 101*   Lipid Panel     Component Value Date/Time   CHOL 141 09/10/2014 0550   TRIG 77 09/10/2014 0550   HDL 31* 09/10/2014 0550   CHOLHDL 4.5 09/10/2014 0550   VLDL 15 09/10/2014 0550   LDLCALC 95 09/10/2014 0550    B NATRIURETIC PEPTIDE  Date/Time Value Ref Range Status  09/09/2014 09:40 AM 98.0 0.0 - 100.0 pg/mL Final   PRO B NATRIURETIC PEPTIDE (BNP)  Date/Time Value Ref Range Status  01/16/2014 11:18 AM 84.0 0.0 - 100.0 pg/mL Final  05/29/2012 11:14 AM 39.0 0.0 - 100.0 pg/mL Final      Radiology: Dg Chest 2 View: 09/09/2014   CLINICAL DATA:  Mid chest and upper abdominal cramping for 1 week. Shortness of breath and nonproductive cough.  EXAM: CHEST  2 VIEW  COMPARISON:  PA and lateral chest 01/16/2014 and 11/29/2012.  FINDINGS: There is cardiomegaly and pulmonary vascular congestion. No consolidative process, pneumothorax or effusion is identified. No focal bony abnormality is seen.  IMPRESSION: No acute disease.  Cardiomegaly and pulmonary vascular congestion.   Electronically Signed   By: Drusilla Kanner M.D.   On: 09/09/2014 09:58   Dg Abd 1 View: 09/09/2014   CLINICAL DATA:  Acute onset severe abdominal pain earlier today.  EXAM: Portable ABDOMEN - 1 VIEW  COMPARISON:  None.  FINDINGS: Bowel gas pattern unremarkable without evidence of obstruction or significant ileus. Moderate stool burden in the colon. No visible opaque urinary tract calculi, though the examination is less than optimal using portable technique, given the patient's body habitus.  IMPRESSION: No acute abdominal abnormality.   Electronically Signed   By: Hulan Saas M.D.   On: 09/09/2014 19:39   Nm Myocar Multi W/spect W/wall Motion / Ef: 09/13/2014   CLINICAL DATA:  39 year old male with 1 week history of chest pain and shortness of breath  EXAM: MYOCARDIAL IMAGING WITH SPECT (REST AND PHARMACOLOGIC-STRESS - 2 DAY PROTOCOL)  GATED LEFT VENTRICULAR WALL MOTION STUDY  LEFT VENTRICULAR EJECTION FRACTION  TECHNIQUE: Standard myocardial SPECT imaging was performed after resting intravenous injection of 10 mCi Tc-58m sestamibi. Subsequently, on a second day, intravenous infusion of Lexiscan was performed under the supervision of the Cardiology staff. At peak effect of the drug, 30 mCi Tc-70m sestamibi was injected intravenously and standard myocardial SPECT imaging was performed. Quantitative gated imaging was also performed to  evaluate left ventricular wall motion, and estimate left ventricular ejection fraction.  COMPARISON:  Chest x-ray 09/09/2014  FINDINGS: Perfusion: Decreased radiotracer uptake in the inferior and inferolateral walls at both rest and stress. While this may be partially due to diaphragmatic attenuation, a remote infarct is favored. Additionally, there is a small focus of decreased radiotracer activity within the apical mid ventricle following stress concerning for a region of reversible ischemia.  Wall Motion: Global hypokinesis.  The left ventricle is dilated.  Left Ventricular Ejection Fraction: 281 %  End  diastolic volume 208 ml  End systolic volume 26 ml  IMPRESSION: 1. Small focus of reversible ischemia in the apical mid ventricle. Additionally, there appears to be a region of prior infarct/ scarring in the inferior and inferolateral walls.  2. Global hypokinesis with marked left ventricular dilatation.  3. Left ventricular ejection fraction 26%  4. High-risk stress test findings*.  *2012 Appropriate Use Criteria for Coronary Revascularization Focused Update: J Am Coll Cardiol. 2012;59(9):857-881. http://content.dementiazones.com.aspx?articleid=1201161   Electronically Signed   By: Malachy Moan M.D.   On: 09/13/2014 13:47    Cardiac Cath: 09/17/2014 - There is severe left ventricular systolic dysfunction. - Elevation of right heart pressures with mild pulmonary hypertension. - The left ventricle is enlarged. There is severe left ventricular systolic dysfunction. The left ventricular ejection fraction is less than 25% by visual estimate. There are wall motion abnormalities in the left ventricle. Dilated LV with global hypokinesis; EF 20 - 25% - Normal coronary arteries.  RECOMMENDATION: The patient will continue to be treated with titrating doses of ace inhibition, beta blocker, amlodipine, aldosteroneblockade and diuretic therapy.Consider initial placement of a LifeVest in light of his severely dysfunction with reassessment of LV function in several months.  Echo: 09/10/2014 Study Conclusions - Left ventricle: False tendon in LV apex of no clinical significance. The cavity size was normal. There was moderate concentric hypertrophy. Systolic function was moderately to severely reduced. The estimated ejection fraction was in the range of 30% to 35%. Diffuse hypokinesis. There was an increased relative contribution of atrial contraction to ventricular filling. Doppler parameters are consistent with abnormal left ventricular relaxation (grade 1 diastolic dysfunction). -  Aorta: Aortic root dimension: 40 mm (ED). - Aortic root: The aortic root was mildly dilated. - Mitral valve: There was trivial regurgitation. - Pericardium, extracardiac: A trivial pericardial effusion was identified posterior to the heart.  FOLLOW UP PLANS AND APPOINTMENTS No Known Allergies   Medication List    STOP taking these medications        HYDROcodone-acetaminophen 5-325 MG per tablet  Commonly known as:  NORCO/VICODIN     naproxen 500 MG tablet  Commonly known as:  NAPROSYN     olmesartan-hydrochlorothiazide 40-12.5 MG per tablet  Commonly known as:  BENICAR HCT      TAKE these medications        albuterol 108 (90 BASE) MCG/ACT inhaler  Commonly known as:  PROVENTIL HFA;VENTOLIN HFA  Inhale 2 puffs into the lungs every 6 (six) hours as needed for wheezing or shortness of breath.     amLODipine 2.5 MG tablet  Commonly known as:  NORVASC  Take 1 tablet (2.5 mg total) by mouth daily.     aspirin 81 MG EC tablet  Take 1 tablet (81 mg total) by mouth daily.     beclomethasone 40 MCG/ACT inhaler  Commonly known as:  QVAR  Inhale 2 puffs into the lungs 2 (two) times daily.     carvedilol 12.5  MG tablet  Commonly known as:  COREG  Take 1 tablet (12.5 mg total) by mouth 2 (two) times daily with a meal.     colchicine 0.6 MG tablet  Take 1 tablet (0.6 mg total) by mouth 2 (two) times daily.     cyclobenzaprine 10 MG tablet  Commonly known as:  FLEXERIL  Take 1 tablet (10 mg total) by mouth 2 (two) times daily as needed for muscle spasms.     furosemide 40 MG tablet  Commonly known as:  LASIX  Take 1 tablet (40 mg total) by mouth 2 (two) times daily.     lisinopril 10 MG tablet  Commonly known as:  PRINIVIL,ZESTRIL  Take 1 tablet (10 mg total) by mouth 2 (two) times daily.     spironolactone 25 MG tablet  Commonly known as:  ALDACTONE  Take 0.5 tablets (12.5 mg total) by mouth daily.         Follow-up Information    Follow up with HAGER, BRYAN,  PA-C On 09/27/2014.   Specialties:  Physician Assistant, Radiology, Interventional Cardiology   Why:  [facility] Follow-Up Appointment on 09/27/2014 at 8:30AM.    Contact information:   [address] [place] [address] [phone]       Follow up with [facility].   Why:  They will contact you for a date and time for your Sleep Study   Contact information:   [address] [place] [address] [phone]      BRING ALL MEDICATIONS WITH YOU TO FOLLOW UP APPOINTMENTS  Time spent with patient to include physician time: 50 minutes Signed: Ellsworth Lennox, PA 09/20/2014, 12:58 PM Co-Sign MD

## 2014-09-20 NOTE — Progress Notes (Signed)
Discharged for home via car transport via friend.  All discharge instructions reviewed.  Patient stated understanding.  No voiced complaints.

## 2014-09-20 NOTE — Progress Notes (Signed)
Patient Name: Noah Rodriguez Date of Encounter: 09/20/2014  Principal Problem:   Acute on chronic combined systolic and diastolic CHF, NYHA class 3 Active Problems:   Hypertensive heart disease   Obesity, Class III, BMI 40-49.9 (morbid obesity)   Sleep apnea   AKI (acute kidney injury)   Elevated troponin   Cardiomyopathy-etiology not yet determined   Chest pain   Abnormal cardiac function test   Non compliance w medication regimen   Pain in the chest   Elevated troponin I level   Primary Cardiologist: New - Dr. Tresa Endo Patient Profile: 39 y.o. Morbidly obese AA male w/ PMH of HTN,Cardiomyopathy, Asthma, chronic LE edema, and non-compliant with medications, admitted 08/29 with CHF, epigastric pain, acute RI, and elevated Troponin. He has diuresed > 15L. Myoview done was abnormal. Demonstrating ejection fraction 26% and a possible inferior wall MI. LHC with normal coronaries and elevated filling pressures.   SUBJECTIVE: Denies any chest pain or palpitations. Having minimal shortness of breath with walking around room. Has still not walked up and down the hallway due to toe pain associated with gout.   OBJECTIVE Filed Vitals:   09/20/14 0622 09/20/14 0830 09/20/14 0836 09/20/14 0938  BP: 118/63 122/68  120/67  Pulse: 57   50  Temp: 98.1 F (36.7 C)   98.1 F (36.7 C)  TempSrc: Oral   Oral  Resp:    16  Height:      Weight: 303 lb 14.4 oz (137.848 kg)     SpO2: 98%  96% 100%    Intake/Output Summary (Last 24 hours) at 09/20/14 1036 Last data filed at 09/20/14 1020  Gross per 24 hour  Intake    945 ml  Output   1300 ml  Net   -355 ml   Filed Weights   09/18/14 0607 09/19/14 0617 09/20/14 0622  Weight: 308 lb 1.6 oz (139.753 kg) 305 lb (138.347 kg) 303 lb 14.4 oz (137.848 kg)    PHYSICAL EXAM General: Well developed, well nourished, male in no acute distress. Head: Normocephalic, atraumatic.  Neck: Supple without bruits, JVD unable to be assessed due to  body habitus. Lungs:  Resp regular and unlabored, CTA without wheezing or rales Heart: RRR, S1, S2, no S3, S4, or murmur; no rub. Abdomen: Soft, non-tender, non-distended with normoactive bowel sounds. No hepatomegaly. No rebound/guarding. No obvious abdominal masses. Extremities: No clubbing, cyanosis, or edema. Distal pedal pulses are 2+ bilaterally. Neuro: Alert and oriented X 3. Moves all extremities spontaneously. Psych: Normal affect.   LABS: CBC: Recent Labs  09/18/14 0453  WBC 6.0  HGB 14.8  HCT 44.2  MCV 83.2  PLT 287   Basic Metabolic Panel: Recent Labs  09/19/14 0410 09/20/14 0611  NA 135 135  K 4.1 4.1  CL 102 101  CO2 25 24  GLUCOSE 108* 101*  BUN 23* 22*  CREATININE 1.46* 1.50*  CALCIUM 8.9 8.8*   BNP:  B NATRIURETIC PEPTIDE  Date/Time Value Ref Range Status  09/09/2014 09:40 AM 98.0 0.0 - 100.0 pg/mL Final    TELE:  Sinus bradycardia with rates in 40's- 60's. Had 5 beats of Ventricular Tachycardia overnight and was asymptomatic.       Current Medications:  . amLODipine  2.5 mg Oral Daily  . aspirin EC  81 mg Oral Daily  . budesonide (PULMICORT) nebulizer solution  0.25 mg Nebulization BID  . carvedilol  12.5 mg Oral BID WC  . colchicine  0.6 mg Oral BID  .  furosemide  40 mg Oral BID  . heparin  5,000 Units Subcutaneous 3 times per day  . lisinopril  10 mg Oral BID  . sodium chloride  3 mL Intravenous Q12H  . spironolactone  12.5 mg Oral Daily      ASSESSMENT AND PLAN:  1. Elevated troponin in setting of atypical chest and epigastric pain - Troponin in ED elevated to 0.06. Cyclic troponins have been <0.03. No further c/p. - Myoview abnormal (small area of apical ischemia with prior inf/inflat infarct). He underwent L/RHC on 09/17/2014 which revealed normal coronary arteries, elevation of right heart pressures with mild pulmonary hypertension and LV dilatation with severe global hypo-contractility and an ejection fraction of 20-25%. - Cont ASA,  BB, ACE-I.   2. Acute on chronic combined systolic and diastolic CHF - Net - 15.83L since admission and weight today on 09/20/14 is 303lbs, down from 305lbs yesterday. Was 320lbs on admission. - His filling pressures were elevated on RHC, and he has been receiving Lasix  IV BID for diuresis. Was switched from IV to PO Lasix  BID on 09/19/2014. - There was discussion about LifeVest but this is not indicated at this time with new onset NICM.  3. Mild persistent asthma - No active wheezing. - Continue home medical therapy.  4. Hypokalemia  - Now resolved. Potassium at 4.1 on 09/20/2014.   5. Obesity, Class III, BMI 40-49.9 (morbid obesity) - Encourage lifestyle modifications.  6. Sleep apnea - Sleep study conducted in 05/2010.  - Says he has been wearing CPAP off and on while in the hospital. - Stressed importance of OSA treatment. Will need repeat sleep study as outpt.   7. AKI (acute kidney injury) - Creatinine at 1.50 on 09/20/2014. Up from baseline of around 1.4.  8. Essential HTN  - BP has been 115/78 - 148/98 in the past 24 hours.  - Continue current medical therapy.  9. Noncompliance - Likely to continue to be a major issue impacting his care.   10. Acute Gout - In setting of diuresis. - Colchicine added 09/16/2014  Likely a discharge today. Patient has scheduled TCM appointment on 09/27/2014 with [name], PA-C.  Alexis Frock , PA-C 10:36 AM 09/20/2014 Pager: [phone]

## 2014-09-20 NOTE — Progress Notes (Signed)
Reviewed/reinforced  Education on heart failure including salt and fluid restrictions, taking medications as prescribed, importance of going to follow-up appointments and using cpap for sleep, weighing self daily recording on weight chart and following zone tool, including when to call MD or 911 for worsening symptoms.

## 2014-09-20 NOTE — Telephone Encounter (Signed)
New problem   7day TCM w/Brian Hager 9.16.16 per [place] calling.

## 2014-09-20 NOTE — Discharge Instructions (Signed)
Limit daily fluid intake to less than 2 Liters per day. °Please limit salt intake.  °Please weight yourself every morning. Call cardiology if weight increases by 3 pounds overnight or 5 pounds in a single week. ° °

## 2014-09-20 NOTE — Plan of Care (Signed)
Problem: Phase II Progression Outcomes Goal: Discharge plan established Outcome: Completed/Met Date Met:  09/20/14 Plan to discharge to home

## 2014-09-20 NOTE — Progress Notes (Signed)
Outpatient Sleep Study referral made to the [facility] for a Sleep Study. They will contact the patient at home for a date and time of Sleep Study. Patient has a CPAP machine at home since 2012; Patient stated that it is packed up somewhere. CM encouraged patient to go home and look for it. Since he already have one his insurance will not pay for another one. Abelino Derrick [facility] [phone]

## 2014-09-23 NOTE — Telephone Encounter (Signed)
Patient contacted regarding discharge from [facility] on 09/20/14.  Patient understands to follow up with [name] on 9/16 at 8:30 am at [facility] office. Patient understands discharge instructions? Yes Patient understands medications and regiment? Yes --- Patient understands to bring all medications to this visit? Yes  Reviewed all medications.  Pt has taken spironolactone 25 mg (whole tablet) x 3 days in error. He is ordered 1/2 tablet.  Correcting his pill box today; splitting the tablets.  Also not taking Colchicine--insurance did not cover it; still having symptoms of gout.  Has not weighed, "I'm buying a scale today" Reviewed low sodium diet options. Monitor BP.

## 2014-09-27 ENCOUNTER — Encounter: Payer: Self-pay | Admitting: *Deleted

## 2014-09-27 ENCOUNTER — Ambulatory Visit (INDEPENDENT_AMBULATORY_CARE_PROVIDER_SITE_OTHER): Payer: 59 | Admitting: Physician Assistant

## 2014-09-27 ENCOUNTER — Telehealth: Payer: Self-pay | Admitting: *Deleted

## 2014-09-27 ENCOUNTER — Encounter: Payer: Self-pay | Admitting: Physician Assistant

## 2014-09-27 VITALS — BP 130/90 | HR 70 | Ht 67.0 in | Wt 305.0 lb

## 2014-09-27 DIAGNOSIS — M109 Gout, unspecified: Secondary | ICD-10-CM | POA: Insufficient documentation

## 2014-09-27 DIAGNOSIS — I5043 Acute on chronic combined systolic (congestive) and diastolic (congestive) heart failure: Secondary | ICD-10-CM | POA: Diagnosis not present

## 2014-09-27 DIAGNOSIS — I10 Essential (primary) hypertension: Secondary | ICD-10-CM | POA: Insufficient documentation

## 2014-09-27 DIAGNOSIS — I428 Other cardiomyopathies: Secondary | ICD-10-CM

## 2014-09-27 DIAGNOSIS — I5042 Chronic combined systolic (congestive) and diastolic (congestive) heart failure: Secondary | ICD-10-CM

## 2014-09-27 DIAGNOSIS — I509 Heart failure, unspecified: Secondary | ICD-10-CM | POA: Diagnosis not present

## 2014-09-27 DIAGNOSIS — I429 Cardiomyopathy, unspecified: Secondary | ICD-10-CM

## 2014-09-27 DIAGNOSIS — I5032 Chronic diastolic (congestive) heart failure: Secondary | ICD-10-CM | POA: Insufficient documentation

## 2014-09-27 DIAGNOSIS — R0600 Dyspnea, unspecified: Secondary | ICD-10-CM

## 2014-09-27 DIAGNOSIS — M1000 Idiopathic gout, unspecified site: Secondary | ICD-10-CM

## 2014-09-27 DIAGNOSIS — G4730 Sleep apnea, unspecified: Secondary | ICD-10-CM

## 2014-09-27 DIAGNOSIS — I110 Hypertensive heart disease with heart failure: Secondary | ICD-10-CM | POA: Diagnosis not present

## 2014-09-27 LAB — BASIC METABOLIC PANEL
BUN: 34 mg/dL — AB (ref 6–23)
CO2: 30 mEq/L (ref 19–32)
Calcium: 9.5 mg/dL (ref 8.4–10.5)
Chloride: 100 mEq/L (ref 96–112)
Creatinine, Ser: 1.79 mg/dL — ABNORMAL HIGH (ref 0.40–1.50)
GFR: 54.47 mL/min — AB (ref >60.00)
Glucose, Bld: 103 mg/dL — ABNORMAL HIGH (ref 70–99)
POTASSIUM: 4.1 meq/L (ref 3.5–5.1)
SODIUM: 137 meq/L (ref 135–145)

## 2014-09-27 LAB — URIC ACID: URIC ACID, SERUM: 11.5 mg/dL — AB (ref 4.0–7.8)

## 2014-09-27 MED ORDER — LISINOPRIL 20 MG PO TABS: 20.0000 mg | ORAL_TABLET | Freq: Two times a day (BID) | ORAL | Status: DC

## 2014-09-27 NOTE — Addendum Note (Signed)
Addended by: Burnetta Sabin on: 09/27/2014 03:33 PM   Modules accepted: Orders

## 2014-09-27 NOTE — Progress Notes (Signed)
Patient ID: Noah Rodriguez, male   DOB: 1975/12/22, 39 y.o.   MRN: [id number]    Date:  09/27/2014   ID:  Noah Rodriguez, DOB August 29, 1975, MRN [id number]  PCP:  Sanda Linger, MD  Primary Cardiologist:  Tresa Endo  Chief complaint: Post hospital follow-up.  TCM   History of Present Illness: Noah Rodriguez is a 39 y.o. male  Noah Rodriguez is a 39 y.o. male with a history of HTN, Asthma, Chronic Systolic and Diastolic CHF, Medication Non-compliance, Obesity, OSA who presented to [facility] on 09/09/2014 for a one week history of chest pain, abdominal pain, and shortness of breath. Troponin initially elevated to 0.06 and T-wave inversion inferior and laterally noted on EKG. Given ASA and 3 SL NTG while in ED and transferred to [facility] for management of NSTEMI.  Cyclic troponins remained negative. An echocardiogram performed on 09/10/2014 showed EF of 30% to 35%, diffuse hypokinesis, and grade 1 diastolic dysfunction. He was started on Lasix  IV BID.  He underwent a 2-day nucleastress test to assess potential causes of his chest pain and initial elevated troponin. The study was completed on 09/13/2014 and showed a small focus of reversible ischemia in the apical mid ventricle, a prior infarct/ scarring in the inferior and inferolateral walls, global hypokinesis with marked left ventricular dilatation, EF of 26% and was labeled as a high-risk stress test findings. A cardiac catheterization was scheduled for 09/17/2014.  Of note, on 09/16/2014, he developed pain in his left great toe and was diagnosed with acute gout. Colchicine 0.6mg  BID was started and continued throughout admission.  The patient was educated on the risks and benefits of cardiac catheterization and he agreed to proceed with the procedure on 09/17/2014. Lasix and Lisinopril were held in preparation for the cath due to elevated creatinine. The procedure was performed without any complications. Cath showed  severe left ventricular systolic dysfunction with EF < 25%, elevated right heart pressures with mild pulmonary hypertension, and normal coronary arteries. It was recommended he continue treatment with ACE-I, BB, Amlodipine, Aldosteroneblockade and diuretic therapy.It was also recommended to consider a LifeVest. The recommendation of a LifeVest was later changed due to literature showing it was indicated for mortality benefit in NICM.   He received  IV Lasix BID following the procedure due to elevated filling pressures. He was switched to  PO Lasix on 09/19/2014. He had a net output of 15.8L during his hospitalization and weight declined 17lbs.Throughout the hospitalization he was also educated on the importance of daily weights, sodium restriction, and medication compliance.   In regards to medications, he had initially been placed on Metoprolol but this was switched to Carvedilol 12.5mg  BID on 09/15/2014 due to LV dysfunction. Aldactone 12.5mg  daily also started on 09/15/2014. Lisinopril was started at  daily and titrated up to  BID. Amlodipine remained the same at 2.5mg  daily. In addition we will continue ASA, Lasix  BID, and Colchicine (for acute gout) at discharge.  In regards to patient's OSA, he had previous sleep study in 2012 and CPAP was recommended at that time. However, he never pursued getting a CPAP due to the cost of the machine. While in the hospital, he was on CPAP and used the machine off and on due to "feeling like the mask was smothering him", although he was encouraged to use it regularly. He will need another outpatient sleep study (since previous one is 39 years old) and likely need a CPAP machine if he remains compliant with this  plan.  The patient presents for posthospital follow-up.  He reports cough and shortness of breath with activity. He sleeps on 2 pillows and seems to be snoring less when he does this. Denies any PND or lower extremity edema.  He is watching  his fluid intake.  Does not have a scale has not been weighing himself daily.  The patient currently denies nausea, vomiting, fever, chest pain, dizziness, congestion, abdominal pain, hematochezia, melena, claudication.  Wt Readings from Last 3 Encounters:  09/27/14 305 lb (138.347 kg)  09/20/14 303 lb 14.4 oz (137.848 kg)  07/18/14 321 lb (145.605 kg)     Past Medical History  Diagnosis Date  . Hypertension   . Asthma   . Sleep apnea     sleep study 05/2010  . Obesity, Class III, BMI 40-49.9 (morbid obesity)   . Acute on chronic systolic and diastolic heart failure, NYHA class 3     EF < 25% on Cath (09/17/2014)  . Hypokalemia   . Acute kidney injury   . Gout of big toe 09/16/2014    Current Outpatient Prescriptions  Medication Sig Dispense Refill  . albuterol (PROVENTIL HFA;VENTOLIN HFA) 108 (90 BASE) MCG/ACT inhaler Inhale 2 puffs into the lungs every 6 (six) hours as needed for wheezing or shortness of breath. 1 Inhaler 11  . amLODipine (NORVASC) 2.5 MG tablet Take 1 tablet (2.5 mg total) by mouth daily. 30 tablet 6  . aspirin EC 81 MG EC tablet Take 1 tablet (81 mg total) by mouth daily.    . beclomethasone (QVAR) 40 MCG/ACT inhaler Inhale 2 puffs into the lungs 2 (two) times daily. 1 Inhaler 12  . carvedilol (COREG) 12.5 MG tablet Take 1 tablet (12.5 mg total) by mouth 2 (two) times daily with a meal. 60 tablet 6  . furosemide (LASIX) 40 MG tablet Take 1 tablet (40 mg total) by mouth 2 (two) times daily. 60 tablet 6  . spironolactone (ALDACTONE) 25 MG tablet Take 0.5 tablets (12.5 mg total) by mouth daily. 30 tablet 6  . lisinopril (PRINIVIL,ZESTRIL) 20 MG tablet Take 1 tablet (20 mg total) by mouth 2 (two) times daily. 180 tablet 3   No current facility-administered medications for this visit.    Allergies:   No Known Allergies  Social History:  The patient  reports that he has never smoked. He has never used smokeless tobacco. He reports that he does not drink alcohol or  use illicit drugs.   Family history:   Family History  Problem Relation Age of Onset  . HIV Mother   . Hypertension Other   . Alcohol abuse Neg Hx   . Cancer Neg Hx   . Early death Neg Hx   . Heart disease Neg Hx   . Hyperlipidemia Neg Hx   . Kidney disease Neg Hx   . Stroke Neg Hx   . Heart attack Neg Hx   . Hypertension Maternal Grandmother   . Hypertension Mother   . Hypertension Paternal Grandmother     ROS:  Please see the history of present illness.  All other systems reviewed and negative.   PHYSICAL EXAM: VS:  BP 130/90 mmHg  Pulse 70  Ht 5\' 7"  (1.702 m)  Wt 305 lb (138.347 kg)  BMI 47.76 kg/m2 Obese, well developed, in no acute distress HEENT: Pupils are equal round react to light accommodation extraocular movements are intact.  Neck: no JVDNo cervical lymphadenopathy. Cardiac: Regular rate and rhythm without murmurs rubs or gallops. Lungs:  clear to auscultation bilaterally, no wheezing, rhonchi or rales Abd: soft, nontender, positive bowel sounds all quadrants, no hepatosplenomegaly Ext: no lower extremity edema.  2+ radial and dorsalis pedis pulses. Skin: warm and dry Neuro:  Grossly normal  EKG: Normal sinus rhythm inferior and lateral T-wave changes.SMENT AND PLAN:  Problem List Items Addressed This Visit    Sleep apnea   Relevant Orders   Split night study   Obesity, Class III, BMI 40-49.9 (morbid obesity)   Nonischemic cardiomyopathy   Relevant Medications   lisinopril (PRINIVIL,ZESTRIL) 20 MG tablet   Other Relevant Orders   Split night study   Hypertensive heart disease   Relevant Medications   lisinopril (PRINIVIL,ZESTRIL) 20 MG tablet   Other Relevant Orders   EKG 12-Lead   Echocardiogram   Basic Metabolic Panel (BMET)   Uric acid   Split night study   Gout   Essential hypertension   Relevant Medications   lisinopril (PRINIVIL,ZESTRIL) 20 MG tablet   Chronic combined systolic and diastolic heart failure, NYHA class 3   Relevant  Medications   lisinopril (PRINIVIL,ZESTRIL) 20 MG tablet   Acute on chronic combined systolic and diastolic CHF, NYHA class 3 - Primary   Relevant Medications   lisinopril (PRINIVIL,ZESTRIL) 20 MG tablet   Other Relevant Orders   EKG 12-Lead   Echocardiogram   Split night study    Other Visit Diagnoses    Dyspnea        Relevant Orders    Basic Metabolic Panel (BMET)    Uric acid       chronic systolic and diastolic heart failure. Patient appears euvolemic his weight is stable. We'll recheck a basic metabolic panel. Recheck echocardiogram in 3 months. Continue current Lasix and Aldactone dosing. Increase lisinopril to 20 mg twice daily. We discussed daily weight monitoring and low-sodium diet and to call the office with sudden weight gain or weight loss.   essential hypertension Increase lisinopril to 20 mg twice daily. Blood pressure log completed by the patient shows consistently elevated pressures.    gout Check uric acid level today. Patient reports still feels much better. [place] healthcare will not cover colchicine.    nonischemic cardio myopathy:  Recheck echocardiogram in 3 months   obesity: Patient will be referred to a registered dietitian  Obstructive sleep apnea: We need to repeat his sleep study and make sure he gets put on a CPAP.

## 2014-09-27 NOTE — Telephone Encounter (Signed)
LMPTCB re: weight scales that [facility] discussed with the pt this morning.  I have contacte [employer] and they do not offer any type of services in the home for this and that the pt will have to provide his own scale.

## 2014-09-27 NOTE — Patient Instructions (Addendum)
Medication Instructions:  Your physician has recommended you make the following change in your medication:  1. INCREASE your Lisinopril to 20 mg taking 1 tablet twice a day (a new prescription has been sent to your pharmacy)   Labwork: TODAY:  BMET & URIC ACID   Testing/Procedures: Your physician has requested that you have an echocardiogram in 3 MONTHS.  Echocardiography is a painless test that uses sound waves to create images of your heart. It provides your doctor with information about the size and shape of your heart and how well your heart's chambers and valves are working. This procedure takes approximately one hour. There are no restrictions for this procedure.  Your physician has recommended that you have a sleep study. This test records several body functions during sleep, including: brain activity, eye movement, oxygen and carbon dioxide blood levels, heart rate and rhythm, breathing rate and rhythm, the flow of air through your mouth and nose, snoring, body muscle movements, and chest and belly movement.   Follow-Up: Your physician recommends that you schedule a follow-up appointment in: 30 DAYS WITH DR. Tresa Endo or BRIAN HAGER, PA-C... IF NOT AVAILABLE, PUT ON THE 1ST AVAILABLE EXTENDER AND THEN 3 MONTHS WITH DR. Tresa Endo.    Any Other Special Instructions Will Be Listed Below (If Applicable).  Echocardiogram An echocardiogram, or echocardiography, uses sound waves (ultrasound) to produce an image of your heart. The echocardiogram is simple, painless, obtained within a short period of time, and offers valuable information to your health care provider. The images from an echocardiogram can provide information such as:  Evidence of coronary artery disease (CAD).  Heart size.  Heart muscle function.  Heart valve function.  Aneurysm detection.  Evidence of a past heart attack.  Fluid buildup around the heart.  Heart muscle thickening.  Assess heart valve function. LET  [facility] CARE PROVIDER KNOW ABOUT:  Any allergies you have.  All medicines you are taking, including vitamins, herbs, eye drops, creams, and over-the-counter medicines.  Previous problems you or members of your family have had with the use of anesthetics.  Any blood disorders you have.  Previous surgeries you have had.  Medical conditions you have.  Possibility of pregnancy, if this applies. BEFORE THE PROCEDURE  No special preparation is needed. Eat and drink normally.  PROCEDURE   In order to produce an image of your heart, gel will be applied to your chest and a wand-like tool (transducer) will be moved over your chest. The gel will help transmit the sound waves from the transducer. The sound waves will harmlessly bounce off your heart to allow the heart images to be captured in real-time motion. These images will then be recorded.  You may need an IV to receive a medicine that improves the quality of the pictures. AFTER THE PROCEDURE You may return to your normal schedule including diet, activities, and medicines, unless your health care provider tells you otherwise. Document Released: 12/26/1999 Document Revised: 05/14/2013 Document Reviewed: 09/04/2012 [facility] Patient Information 2015 [place], [place]. This information is not intended to replace advice given to you by your health care provider. Make sure you discuss any questions you have with your health care provider.   Polysomnography (Sleep Studies) Polysomnography (PSG) is a series of tests used for detecting (diagnosing) obstructive sleep apnea and other sleep disorders. The tests measure how some parts of your body are working while you are sleeping. The tests are extensive and expensive. They are done in a sleep lab or hospital, and  vary from center to center. Your caregiver may perform other more simple sleep studies and questionnaires before doing more complete and involved testing. Testing may not be covered by  insurance. Some of these tests are:  An EEG (Electroencephalogram). This tests your brain waves and stages of sleep.  An EOG (Electrooculogram). This measures the movements of your eyes. It detects periods of REM (rapid eye movement) sleep, which is your dream sleep.  An EKG (Electrocardiogram). This measures your heart rhythm.  EMG (Electromyography). This is a measurement of how the muscles are working in your upper airway and your legs while sleeping.  An oximetry measurement. It measures how much oxygen (air) you are getting while sleeping.  Breathing efforts may be measured. The same test can be interpreted (understood) differently by different caregivers and centers that study sleep.  Studies may be given an apnea/hypopnea index (AHI). This is a number which is found by counting the times of no breathing or under breathing during the night, and relating those numbers to the amount of time spent in bed. When the AHI is greater than 15, the patient is likely to complain of daytime sleepiness. When the AHI is greater than 30, the patient is at increased risk for heart problems and must be followed more closely. Following the AHI also allows you to know how treatment is working. Simple oximetry (tracking the amount of oxygen that is taken in) can be used for screening patients who:  Do not have symptoms (problems) of OSA.  Have a normal Epworth Sleepiness Scale Score.  Have a low pre-test probability of having OSA.  Have none of the upper airway problems likely to cause apnea.  Oximetry is also used to determine if treatment is effective in patients who showed significant desaturations (not getting enough oxygen) on their home sleep study. One extra measure of safety is to perform additional studies for the person who only snores. This is because no one can predict with absolute certainty who will have OSA. Those who show significant desaturations (not getting enough oxygen) are  recommended to have a more detailed sleep study. Document Released: 07/04/2002 Document Revised: 03/22/2011 Document Reviewed: 03/05/2013 [facility] Patient Information 2015 [place], [place]. This information is not intended to replace advice given to you by your health care provider. Make sure you discuss any questions you have with your health care provider.

## 2014-10-01 NOTE — Telephone Encounter (Signed)
Pt has been left several messages re: lab results.  [name], PA-C was going to make some medication changes on Friday, 09-27-14, but6 pt never called back.  Per Mardee Postin just get another BMET and see where his kidney function is at. LMOM for pt to call back.Marland Kitchen9-20-16

## 2014-10-02 ENCOUNTER — Other Ambulatory Visit: Payer: Self-pay | Admitting: *Deleted

## 2014-10-02 ENCOUNTER — Telehealth: Payer: Self-pay | Admitting: *Deleted

## 2014-10-02 ENCOUNTER — Other Ambulatory Visit (INDEPENDENT_AMBULATORY_CARE_PROVIDER_SITE_OTHER): Payer: 59

## 2014-10-02 DIAGNOSIS — I110 Hypertensive heart disease with heart failure: Secondary | ICD-10-CM

## 2014-10-02 DIAGNOSIS — I509 Heart failure, unspecified: Secondary | ICD-10-CM | POA: Diagnosis not present

## 2014-10-02 DIAGNOSIS — I5043 Acute on chronic combined systolic (congestive) and diastolic (congestive) heart failure: Secondary | ICD-10-CM

## 2014-10-02 DIAGNOSIS — I428 Other cardiomyopathies: Secondary | ICD-10-CM

## 2014-10-02 DIAGNOSIS — I429 Cardiomyopathy, unspecified: Secondary | ICD-10-CM | POA: Diagnosis not present

## 2014-10-02 DIAGNOSIS — R0600 Dyspnea, unspecified: Secondary | ICD-10-CM

## 2014-10-02 LAB — BASIC METABOLIC PANEL
BUN: 39 mg/dL — AB (ref 6–23)
CHLORIDE: 103 meq/L (ref 96–112)
CO2: 32 mEq/L (ref 19–32)
CREATININE: 2.07 mg/dL — AB (ref 0.40–1.50)
Calcium: 9.5 mg/dL (ref 8.4–10.5)
GFR: 46.06 mL/min — ABNORMAL LOW (ref >60.00)
GLUCOSE: 108 mg/dL — AB (ref 70–99)
POTASSIUM: 4 meq/L (ref 3.5–5.1)
Sodium: 140 mEq/L (ref 135–145)

## 2014-10-02 NOTE — Telephone Encounter (Signed)
Have been trying to reach the pt re: lab results since 09-27-14.  Finally got the pt this morning and per [name], PA-C, since we could not reach the pt to change the dosage on the Lasix, the pt will come into the office today to recheck his BMET and then will make changes according to new results.

## 2014-10-02 NOTE — Telephone Encounter (Signed)
Called the pt to advise him that his SCR has gone up since 09-27-14 and that he needed to STOP the Lasix X 3 days.  Starting Sunday, 10-06-14, RESTART Lasix back at 40 mg taking 1 tablet a day.  Pt was advised that he needed to check his weight daily and make sure it is not going up.  Pt also advised that he needed to have his BMET rechecked on 10-07-14 @ the office.  Orders are in EPIC.  Pt was advised to ask for me when he came in for his lab work and I would weigh him.  Pt verbalized understanding.

## 2014-10-02 NOTE — Telephone Encounter (Signed)
-----   Message from Dwana Melena, PA-C sent at 10/02/2014  3:50 PM EDT ----- Now his SCr is even higher!  He needs to hold the lasix for three days, then once daily thereafter.  Monitor weight daily and make sure it does not go up.  Another BMET Monday and have someone weigh him when he comes in for the lab.  Thanks, Judie Grieve

## 2014-10-07 ENCOUNTER — Other Ambulatory Visit (INDEPENDENT_AMBULATORY_CARE_PROVIDER_SITE_OTHER): Payer: 59 | Admitting: *Deleted

## 2014-10-07 DIAGNOSIS — R0600 Dyspnea, unspecified: Secondary | ICD-10-CM | POA: Diagnosis not present

## 2014-10-07 DIAGNOSIS — I509 Heart failure, unspecified: Secondary | ICD-10-CM | POA: Diagnosis not present

## 2014-10-07 DIAGNOSIS — I110 Hypertensive heart disease with heart failure: Secondary | ICD-10-CM

## 2014-10-07 LAB — BASIC METABOLIC PANEL
BUN: 24 mg/dL — ABNORMAL HIGH (ref 6–23)
CHLORIDE: 101 meq/L (ref 96–112)
CO2: 29 meq/L (ref 19–32)
Calcium: 9.3 mg/dL (ref 8.4–10.5)
Creatinine, Ser: 1.52 mg/dL — ABNORMAL HIGH (ref 0.40–1.50)
GFR: 65.77 mL/min (ref >60.00)
Glucose, Bld: 100 mg/dL — ABNORMAL HIGH (ref 70–99)
Potassium: 3.8 mEq/L (ref 3.5–5.1)
SODIUM: 138 meq/L (ref 135–145)

## 2014-10-08 ENCOUNTER — Telehealth: Payer: Self-pay | Admitting: *Deleted

## 2014-10-08 NOTE — Telephone Encounter (Signed)
Called pt to inform him that his lab results were back and that his kidney function has improved and to continue what he was doing, that we will not be making any changes at the moment.  Pt verbalized understanding.

## 2014-12-20 ENCOUNTER — Encounter (HOSPITAL_BASED_OUTPATIENT_CLINIC_OR_DEPARTMENT_OTHER): Payer: 59

## 2014-12-23 ENCOUNTER — Other Ambulatory Visit (HOSPITAL_COMMUNITY): Payer: 59

## 2014-12-23 DIAGNOSIS — R0989 Other specified symptoms and signs involving the circulatory and respiratory systems: Secondary | ICD-10-CM

## 2014-12-24 ENCOUNTER — Encounter (HOSPITAL_COMMUNITY): Payer: Self-pay | Admitting: Radiology

## 2014-12-30 ENCOUNTER — Ambulatory Visit (INDEPENDENT_AMBULATORY_CARE_PROVIDER_SITE_OTHER): Payer: 59 | Admitting: Cardiovascular Disease

## 2014-12-30 ENCOUNTER — Encounter: Payer: Self-pay | Admitting: Cardiovascular Disease

## 2014-12-30 VITALS — BP 130/88 | HR 82 | Ht 67.0 in | Wt 315.4 lb

## 2014-12-30 DIAGNOSIS — I110 Hypertensive heart disease with heart failure: Secondary | ICD-10-CM

## 2014-12-30 DIAGNOSIS — E66813 Obesity, class 3: Secondary | ICD-10-CM

## 2014-12-30 DIAGNOSIS — G4730 Sleep apnea, unspecified: Secondary | ICD-10-CM

## 2014-12-30 DIAGNOSIS — I517 Cardiomegaly: Secondary | ICD-10-CM

## 2014-12-30 DIAGNOSIS — R9430 Abnormal result of cardiovascular function study, unspecified: Secondary | ICD-10-CM

## 2014-12-30 DIAGNOSIS — Z79899 Other long term (current) drug therapy: Secondary | ICD-10-CM | POA: Diagnosis not present

## 2014-12-30 DIAGNOSIS — I10 Essential (primary) hypertension: Secondary | ICD-10-CM

## 2014-12-30 MED ORDER — SPIRONOLACTONE 25 MG PO TABS: 12.5000 mg | ORAL_TABLET | Freq: Two times a day (BID) | ORAL | Status: DC

## 2014-12-30 MED ORDER — CARVEDILOL 25 MG PO TABS: 25.0000 mg | ORAL_TABLET | Freq: Two times a day (BID) | ORAL | Status: DC

## 2014-12-30 NOTE — Patient Instructions (Signed)
Your physician has recommended you make the following change in your medication:   1.) the carvedilol has been increased. Take 1 & 1/2 of the 12. 5 mg tablets twice a day for 3 weeks, then start new prescription written today for the 25 mg tablets. Take as directed on the bottle.  2.) the spironalactone has been increased from 1/2 tablet daily to 1/2 tablet twice a day.   Your physician recommends that you return for lab work in: fasting.  Your physician recommends that you schedule a follow-up appointment in: 2 months with Dr Tresa Endo.

## 2014-12-30 NOTE — Progress Notes (Signed)
Patient ID: Noah Rodriguez, male   DOB: 1975/06/28, 39 y.o.   MRN: [id number]    Primary MD: Scarlette Calico, MD  HPI: Noah Rodriguez is a 39 y.o. male who presents to the office today for an  initialfollow up cardiology evaluation  With a stenosis hospitalization in August 2016.   Noah Rodriguez is a 39 year old Afro-American male was a history of morbid obesity, hypertension, and documented cardiomegaly.  Since 2013.  In 2014, echo Doppler study showed an EF of 35-40% with moderate LVH and grade 1 diastolic dysfunction. He has had untreated sleep apnea. Earlier this year he did run out of his medications for over 5 months in August was admitted with symptoms of increasing shortness of breath and CHF.  Medical therapy was reinstituted.  A nuclear perfusion study was interpreted as high risk and demonstrated a small focus of reversible ischemia in the apical mid ventricle as well as a region of prior infarct/scarring in the inferior inferolateral walls.  Ejection fraction was 26% and he had marked LV dilation with global hypokinesis.  He was referred to me for right left heart catheterization which I performed on 09/09/2014. This revealed elevation of right heart pressures with mild pulmonary hypertension, severe LV dysfunction with an ejection fraction of 20-25%, and normal coronary arteries.   Socially, he has been on increasing medical therapy.  He was seen Noah Rodriguez in follow-up of his hospitalization on 09/27/2014 and his carvedilol dose was increased from 3.125-6.25 mg twice a day.  He also has been taking lisinopril which was in trace to 20 g twice a day and spironolactone 12.5 mg daily as well as amlodipine 2.5 mg.  Over the past several months, he has continued to work.  He denies any shortness of breath at rest or any chest pain.  At times she does note some mild exertional shortness of breath.  He has not been successful with weight loss. He denies palpitations. He denies presyncope.  He  presents for evaluation.  Past Medical History  Diagnosis Date  . Hypertension   . Asthma   . Sleep apnea     sleep study 05/2010  . Obesity, Class III, BMI 40-49.9 (morbid obesity) ([place])   . Acute on chronic systolic and diastolic heart failure, NYHA class 3 (H[facility])     EF < 25% on Cath (09/17/2014)  . Hypokalemia   . Acute kidney injury ([place])   . Gout of big toe 09/16/2014    Past Surgical History  Procedure Laterality Date  . Cardiac catheterization N/A 09/17/2014    Procedure: Right/Left Heart Cath and Coronary Angiography;  Surgeon: Noah Sine, MD;  Location: [facility];  Service: Cardiovascular;  Laterality: N/A;    No Known Allergies  Current Outpatient Prescriptions  Medication Sig Dispense Refill  . albuterol (PROVENTIL HFA;VENTOLIN HFA) 108 (90 BASE) MCG/ACT inhaler Inhale 2 puffs into the lungs every 6 (six) hours as needed for wheezing or shortness of breath. 1 Inhaler 11  . amLODipine (NORVASC) 2.5 MG tablet Take 1 tablet (2.5 mg total) by mouth daily. 30 tablet 6  . aspirin EC 81 MG EC tablet Take 1 tablet (81 mg total) by mouth daily.    . beclomethasone (QVAR) 40 MCG/ACT inhaler Inhale 2 puffs into the lungs 2 (two) times daily. 1 Inhaler 12  . furosemide (LASIX) 40 MG tablet Take 1 tablet (40 mg total) by mouth 2 (two) times daily. 60 tablet 6  . lisinopril (PRINIVIL,ZESTRIL) 20 MG tablet Take  1 tablet (20 mg total) by mouth 2 (two) times daily. 180 tablet 3  . spironolactone (ALDACTONE) 25 MG tablet Take 0.5 tablets (12.5 mg total) by mouth 2 (two) times daily. 30 tablet 6  . carvedilol (COREG) 25 MG tablet Take 1 tablet (25 mg total) by mouth 2 (two) times daily. 180 tablet 3   No current facility-administered medications for this visit.    Social History   Social History  . Marital Status: Married    Spouse Name: N/A  . Number of Children: N/A  . Years of Education: N/A   Occupational History  . Not on file.   Social History Main Topics  .  Smoking status: Never Smoker   . Smokeless tobacco: Never Used  . Alcohol Use: No  . Drug Use: No  . Sexual Activity: Yes    Birth Control/ Protection: Condom   Other Topics Concern  . Not on file   Social History Narrative    Family History  Problem Relation Age of Onset  . HIV Mother   . Hypertension Other   . Alcohol abuse Neg Hx   . Cancer Neg Hx   . Early death Neg Hx   . Heart disease Neg Hx   . Hyperlipidemia Neg Hx   . Kidney disease Neg Hx   . Stroke Neg Hx   . Heart attack Neg Hx   . Hypertension Maternal Grandmother   . Hypertension Mother   . Hypertension Paternal Grandmother     ROS General: Negative; No fevers, chills, or night sweats HEENT: Negative; No changes in vision or hearing, sinus congestion, difficulty swallowing Pulmonary: Negative; No cough, wheezing, shortness of breath, hemoptysis Cardiovascular: See HPI:  GI: Negative; No nausea, vomiting, diarrhea, or abdominal pain GU: Negative; No dysuria, hematuria, or difficulty voiding Musculoskeletal: Negative; no myalgias, joint pain, or weakness Hematologic: Negative; no easy bruising, bleeding Endocrine: Negative; no heat/cold intolerance; no diabetes, Neuro: Negative; no changes in balance, headaches Skin: Negative; No rashes or skin lesions Psychiatric: Negative; No behavioral problems, depression Sleep:  Positive for untreated sleep apnea with  snoring,  Mild daytime sleepiness; no bruxism, restless legs, hypnogognic hallucinations. Other comprehensive 14 point system review is negative   Physical Exam BP 130/88 mmHg  Pulse 82  Ht '5\' 7"'  (1.702 m)  Wt 315 lb 6.4 oz (143.065 kg)  BMI 49.39 kg/m2 Wt Readings from Last 3 Encounters:  12/30/14 315 lb 6.4 oz (143.065 kg)  09/27/14 305 lb (138.347 kg)  09/20/14 303 lb 14.4 oz (137.848 kg)   General: Alert, oriented, no distress.  Skin: normal turgor, no rashes, warm and dry HEENT: Normocephalic, atraumatic. Pupils equal round and reactive  to light; sclera anicteric; extraocular muscles intact, No lid lag; Nose without nasal septal hypertrophy; Mouth/Parynx benign; Mallinpatti scale 3 Neck: Thick neck No JVD, no carotid bruits; normal carotid upstroke Lungs: clear to ausculatation and percussion bilaterally; no wheezing or rales, normal inspiratory and expiratory effort Chest wall: without tenderness to palpitation Heart: PMI not displaced, RRR, s1 s2 normal, 1/6 sem systolic murmur, No diastolic murmur, no rubs, gallops, thrills, or heaves Abdomen: soft, nontender; no hepatosplenomehaly, BS+; abdominal aorta nontender and not dilated by palpation. Back: no CVA tenderness Pulses: 2+  Musculoskeletal: full range of motion, normal strength, no joint deformities Extremities: Pulses 2+, no clubbing cyanosis or edema, Homan's sign negative  Neurologic: grossly nonfocal; Cranial nerves grossly wnl Psychologic: Normal mood and affect   ECG (independently read by me):  Normal sinus rhythm  at 82 bpm. Q wave in lead 3. Inferolateral ST-T changes.  LABS:  BMP Latest Ref Rng 10/07/2014 10/02/2014 09/27/2014  Glucose 70 - 99 mg/dL 100(H) 108(H) 103(H)  BUN 6 - 23 mg/dL 24(H) 39(H) 34(H)  Creatinine 0.40 - 1.50 mg/dL 1.52(H) 2.07(H) 1.79(H)  Sodium 135 - 145 mEq/L 138 140 137  Potassium 3.5 - 5.1 mEq/L 3.8 4.0 4.1  Chloride 96 - 112 mEq/L 101 103 100  CO2 19 - 32 mEq/L 29 32 30  Calcium 8.4 - 10.5 mg/dL 9.3 9.5 9.5     Hepatic Function Latest Ref Rng 09/09/2014 01/16/2014 05/29/2012  Total Protein 6.5 - 8.1 g/dL 7.6 7.4 7.9  Albumin 3.5 - 5.0 g/dL 4.3 4.1 4.0  AST 15 - 41 U/L 43(H) 33 34  ALT 17 - 63 U/L 56 43 52  Alk Phosphatase 38 - 126 U/L 81 80 65  Total Bilirubin 0.3 - 1.2 mg/dL 0.7 0.8 0.7  Bilirubin, Direct 0.0-0.3 mg/dL - - -    CBC Latest Ref Rng 09/18/2014 09/17/2014 09/09/2014  WBC 4.0 - 10.5 K/uL 6.0 5.6 5.5  Hemoglobin 13.0 - 17.0 g/dL 14.8 13.4 13.0  Hematocrit 39.0 - 52.0 % 44.2 41.3 40.5  Platelets 150 - 400 K/uL 287  324 288   Lab Results  Component Value Date   MCV 83.2 09/18/2014   MCV 83.8 09/17/2014   MCV 84.9 09/09/2014    Lab Results  Component Value Date   TSH 1.448 09/09/2014    BNP    Component Value Date/Time   BNP 98.0 09/09/2014 0940    ProBNP    Component Value Date/Time   PROBNP 84.0 01/16/2014 1118     Lipid Panel     Component Value Date/Time   CHOL 141 09/10/2014 0550   TRIG 77 09/10/2014 0550   HDL 31* 09/10/2014 0550   CHOLHDL 4.5 09/10/2014 0550   VLDL 15 09/10/2014 0550   LDLCALC 95 09/10/2014 0550     RADIOLOGY: No results found.    ASSESSMENT AND PLAN:  Noah Rodriguez is a 39 year old morbidly obese African-American male who has a nonischemic myopathy and long-standing history of hypertension with combined recent systolic and diastolic CHF.  He also has a history of untreated obstructive sleep apnea.  I reviewed his most recent hospitalization and subsequent office visits. I again reviewed with him in detail.  His cardiac catheterization findings. He's not having any chest pain.  He is feeling significantly improved from August when he presented with CHF symptoms.  Presently, his resting pulse is in the 80s.  I'm further titrating his carvedilol to 18.75 mg twice a day for the next 3 weeks and then he will increase this to 25 Milligan's twice a day. I will also further titrate his spironolactone to 12.5 mg twice a day.  Laboratory studies will be obtained in the fasting state.  I discussed with him the adverse consequences is untreated sleep apnea.  We have scheduled him for a split-night sleep study and this is tentatively scheduled for 01/28/2015. He may ultimately be a candidate for corlander and/or possibly entresto.  If his LV function does not significant improved with increasing medical therapy, further discussion concerning prophylactic ICD implantation may be necessary.  I will see him in the office in 2 months for reevaluation or sooner if  problems arise.     Noah Sine, MD, [facility]  12/30/2014 2:06 PM

## 2015-01-28 ENCOUNTER — Encounter (HOSPITAL_BASED_OUTPATIENT_CLINIC_OR_DEPARTMENT_OTHER): Payer: 59

## 2015-02-10 ENCOUNTER — Encounter (HOSPITAL_BASED_OUTPATIENT_CLINIC_OR_DEPARTMENT_OTHER): Payer: 59

## 2015-02-26 ENCOUNTER — Ambulatory Visit (HOSPITAL_BASED_OUTPATIENT_CLINIC_OR_DEPARTMENT_OTHER): Payer: 59 | Attending: Physician Assistant

## 2015-03-13 ENCOUNTER — Encounter: Payer: 59 | Admitting: Internal Medicine

## 2015-03-17 ENCOUNTER — Ambulatory Visit: Payer: 59 | Admitting: Cardiovascular Disease

## 2015-03-17 ENCOUNTER — Encounter: Payer: Self-pay | Admitting: *Deleted

## 2015-06-10 ENCOUNTER — Emergency Department: Payer: 59

## 2015-06-10 ENCOUNTER — Inpatient Hospital Stay
Admission: EM | Admit: 2015-06-10 | Discharge: 2015-06-12 | DRG: 353 | Disposition: A | Discharge status: Home / Self Care | Payer: 59 | Attending: Surgery | Admitting: Surgery

## 2015-06-10 DIAGNOSIS — Z91148 Patient's other noncompliance with medication regimen for other reason: Secondary | ICD-10-CM

## 2015-06-10 DIAGNOSIS — R1013 Epigastric pain: Secondary | ICD-10-CM | POA: Diagnosis present

## 2015-06-10 DIAGNOSIS — Z8249 Family history of ischemic heart disease and other diseases of the circulatory system: Secondary | ICD-10-CM | POA: Diagnosis not present

## 2015-06-10 DIAGNOSIS — Z9119 Patient's noncompliance with other medical treatment and regimen: Secondary | ICD-10-CM | POA: Diagnosis not present

## 2015-06-10 DIAGNOSIS — Z01810 Encounter for preprocedural cardiovascular examination: Secondary | ICD-10-CM | POA: Diagnosis not present

## 2015-06-10 DIAGNOSIS — I110 Hypertensive heart disease with heart failure: Secondary | ICD-10-CM | POA: Diagnosis present

## 2015-06-10 DIAGNOSIS — K436 Other and unspecified ventral hernia with obstruction, without gangrene: Principal | ICD-10-CM

## 2015-06-10 DIAGNOSIS — K439 Ventral hernia without obstruction or gangrene: Secondary | ICD-10-CM | POA: Diagnosis present

## 2015-06-10 DIAGNOSIS — I429 Cardiomyopathy, unspecified: Secondary | ICD-10-CM | POA: Diagnosis present

## 2015-06-10 DIAGNOSIS — K5900 Constipation, unspecified: Secondary | ICD-10-CM | POA: Diagnosis present

## 2015-06-10 DIAGNOSIS — R1084 Generalized abdominal pain: Secondary | ICD-10-CM | POA: Insufficient documentation

## 2015-06-10 DIAGNOSIS — I252 Old myocardial infarction: Secondary | ICD-10-CM | POA: Diagnosis not present

## 2015-06-10 DIAGNOSIS — I5042 Chronic combined systolic (congestive) and diastolic (congestive) heart failure: Secondary | ICD-10-CM | POA: Diagnosis present

## 2015-06-10 DIAGNOSIS — I119 Hypertensive heart disease without heart failure: Secondary | ICD-10-CM | POA: Diagnosis present

## 2015-06-10 DIAGNOSIS — I428 Other cardiomyopathies: Secondary | ICD-10-CM

## 2015-06-10 DIAGNOSIS — G4730 Sleep apnea, unspecified: Secondary | ICD-10-CM

## 2015-06-10 DIAGNOSIS — K469 Unspecified abdominal hernia without obstruction or gangrene: Secondary | ICD-10-CM | POA: Insufficient documentation

## 2015-06-10 DIAGNOSIS — J9601 Acute respiratory failure with hypoxia: Secondary | ICD-10-CM | POA: Diagnosis not present

## 2015-06-10 DIAGNOSIS — G4733 Obstructive sleep apnea (adult) (pediatric): Secondary | ICD-10-CM | POA: Diagnosis present

## 2015-06-10 DIAGNOSIS — R079 Chest pain, unspecified: Secondary | ICD-10-CM

## 2015-06-10 DIAGNOSIS — Z9114 Patient's other noncompliance with medication regimen: Secondary | ICD-10-CM | POA: Diagnosis not present

## 2015-06-10 DIAGNOSIS — Z6841 Body Mass Index (BMI) 40.0 and over, adult: Secondary | ICD-10-CM

## 2015-06-10 DIAGNOSIS — I10 Essential (primary) hypertension: Secondary | ICD-10-CM

## 2015-06-10 DIAGNOSIS — I130 Hypertensive heart and chronic kidney disease with heart failure and stage 1 through stage 4 chronic kidney disease, or unspecified chronic kidney disease: Secondary | ICD-10-CM | POA: Diagnosis present

## 2015-06-10 DIAGNOSIS — E66813 Obesity, class 3: Secondary | ICD-10-CM | POA: Diagnosis present

## 2015-06-10 HISTORY — DX: Patient's other noncompliance with medication regimen for other reason: Z91.148

## 2015-06-10 HISTORY — DX: Ventral hernia without obstruction or gangrene: K43.9

## 2015-06-10 HISTORY — DX: Chronic combined systolic (congestive) and diastolic (congestive) heart failure: I50.42

## 2015-06-10 HISTORY — DX: Acute myocardial infarction, unspecified: I21.9

## 2015-06-10 HISTORY — DX: Other cardiomyopathies: I42.8

## 2015-06-10 HISTORY — DX: Hypertensive heart disease without heart failure: I11.9

## 2015-06-10 HISTORY — DX: Patient's other noncompliance with medication regimen: Z91.14

## 2015-06-10 LAB — CBC WITH DIFFERENTIAL/PLATELET
BASOS ABS: 0 10*3/uL (ref 0–0.1)
BASOS PCT: 1 %
EOS PCT: 1 %
Eosinophils Absolute: 0.1 10*3/uL (ref 0–0.7)
HCT: 41.4 % (ref 40.0–52.0)
Hemoglobin: 13.8 g/dL (ref 13.0–18.0)
LYMPHS PCT: 20 %
Lymphs Abs: 1.3 10*3/uL (ref 1.0–3.6)
MCH: 27.3 pg (ref 26.0–34.0)
MCHC: 33.2 g/dL (ref 32.0–36.0)
MCV: 82.1 fL (ref 80.0–100.0)
Monocytes Absolute: 0.5 10*3/uL (ref 0.2–1.0)
Monocytes Relative: 7 %
Neutro Abs: 4.8 10*3/uL (ref 1.4–6.5)
Neutrophils Relative %: 71 %
PLATELETS: 283 10*3/uL (ref 150–440)
RBC: 5.04 MIL/uL (ref 4.40–5.90)
RDW: 14 % (ref 11.5–14.5)
WBC: 6.7 10*3/uL (ref 3.8–10.6)

## 2015-06-10 LAB — COMPREHENSIVE METABOLIC PANEL
ALK PHOS: 67 U/L (ref 38–126)
ALT: 46 U/L (ref 17–63)
ANION GAP: 6 (ref 5–15)
AST: 30 U/L (ref 15–41)
Albumin: 4.1 g/dL (ref 3.5–5.0)
BILIRUBIN TOTAL: 0.5 mg/dL (ref 0.3–1.2)
BUN: 18 mg/dL (ref 6–20)
CALCIUM: 8.6 mg/dL — AB (ref 8.9–10.3)
CO2: 30 mmol/L (ref 22–32)
CREATININE: 1.29 mg/dL — AB (ref 0.61–1.24)
Chloride: 103 mmol/L (ref 101–111)
Glucose, Bld: 117 mg/dL — ABNORMAL HIGH (ref 65–99)
Potassium: 3.5 mmol/L (ref 3.5–5.1)
Sodium: 139 mmol/L (ref 135–145)
TOTAL PROTEIN: 7.3 g/dL (ref 6.5–8.1)

## 2015-06-10 LAB — BRAIN NATRIURETIC PEPTIDE: B NATRIURETIC PEPTIDE 5: 54 pg/mL (ref 0.0–100.0)

## 2015-06-10 LAB — URINALYSIS COMPLETE WITH MICROSCOPIC (ARMC ONLY)
BACTERIA UA: NONE SEEN
BILIRUBIN URINE: NEGATIVE
GLUCOSE, UA: NEGATIVE mg/dL
KETONES UR: NEGATIVE mg/dL
LEUKOCYTES UA: NEGATIVE
NITRITE: NEGATIVE
Protein, ur: NEGATIVE mg/dL
SPECIFIC GRAVITY, URINE: 1.016 (ref 1.005–1.030)
Squamous Epithelial / LPF: NONE SEEN
pH: 7 (ref 5.0–8.0)

## 2015-06-10 LAB — TROPONIN I: Troponin I: 0.03 ng/mL (ref <0.031)

## 2015-06-10 LAB — LIPASE, BLOOD: LIPASE: 24 U/L (ref 11–51)

## 2015-06-10 MED ORDER — MORPHINE SULFATE (PF) 4 MG/ML IV SOLN
4.0000 mg | Freq: Once | INTRAVENOUS | Status: AC
  Administered 2015-06-10: 4 mg via INTRAVENOUS
  Filled 2015-06-10: qty 1

## 2015-06-10 MED ORDER — HYDRALAZINE HCL 20 MG/ML IJ SOLN
10.0000 mg | Freq: Once | INTRAMUSCULAR | Status: AC
  Administered 2015-06-10: 10 mg via INTRAVENOUS
  Filled 2015-06-10: qty 1

## 2015-06-10 MED ORDER — ALBUTEROL SULFATE (2.5 MG/3ML) 0.083% IN NEBU: 2.5000 mg | INHALATION_SOLUTION | Freq: Four times a day (QID) | RESPIRATORY_TRACT | Status: DC | PRN

## 2015-06-10 MED ORDER — ALBUTEROL SULFATE HFA 108 (90 BASE) MCG/ACT IN AERS: 2.0000 | INHALATION_SPRAY | Freq: Four times a day (QID) | RESPIRATORY_TRACT | Status: DC | PRN

## 2015-06-10 MED ORDER — ONDANSETRON HCL 4 MG PO TABS: 4.0000 mg | ORAL_TABLET | Freq: Four times a day (QID) | ORAL | Status: DC | PRN

## 2015-06-10 MED ORDER — ONDANSETRON HCL 4 MG/2ML IJ SOLN
4.0000 mg | Freq: Four times a day (QID) | INTRAMUSCULAR | Status: DC | PRN
  Administered 2015-06-11 (×2): 4 mg via INTRAVENOUS
  Filled 2015-06-10: qty 2

## 2015-06-10 MED ORDER — AMLODIPINE BESYLATE 5 MG PO TABS
2.5000 mg | ORAL_TABLET | Freq: Every day | ORAL | Status: DC
  Administered 2015-06-12: 2.5 mg via ORAL
  Filled 2015-06-10: qty 1

## 2015-06-10 MED ORDER — SODIUM CHLORIDE 0.9% FLUSH
3.0000 mL | Freq: Two times a day (BID) | INTRAVENOUS | Status: DC
  Administered 2015-06-10: 3 mL via INTRAVENOUS

## 2015-06-10 MED ORDER — ASPIRIN EC 81 MG PO TBEC
81.0000 mg | DELAYED_RELEASE_TABLET | Freq: Every day | ORAL | Status: DC
  Administered 2015-06-12: 81 mg via ORAL
  Filled 2015-06-10: qty 1

## 2015-06-10 MED ORDER — HYDRALAZINE HCL 20 MG/ML IJ SOLN: 10.0000 mg | Freq: Four times a day (QID) | INTRAMUSCULAR | Status: DC | PRN

## 2015-06-10 MED ORDER — HEPARIN SODIUM (PORCINE) 5000 UNIT/ML IJ SOLN
5000.0000 [IU] | Freq: Three times a day (TID) | INTRAMUSCULAR | Status: DC
  Administered 2015-06-10 – 2015-06-12 (×5): 5000 [IU] via SUBCUTANEOUS
  Filled 2015-06-10 (×5): qty 1

## 2015-06-10 MED ORDER — IOPAMIDOL (ISOVUE-300) INJECTION 61%
100.0000 mL | Freq: Once | INTRAVENOUS | Status: AC | PRN
  Administered 2015-06-10: 100 mL via INTRAVENOUS

## 2015-06-10 MED ORDER — LABETALOL HCL 5 MG/ML IV SOLN
10.0000 mg | Freq: Three times a day (TID) | INTRAVENOUS | Status: DC
  Administered 2015-06-10: 10 mg via INTRAVENOUS

## 2015-06-10 MED ORDER — ACETAMINOPHEN 325 MG PO TABS
650.0000 mg | ORAL_TABLET | Freq: Four times a day (QID) | ORAL | Status: DC | PRN
  Administered 2015-06-10: 650 mg via ORAL
  Filled 2015-06-10 (×2): qty 2

## 2015-06-10 MED ORDER — DIATRIZOATE MEGLUMINE & SODIUM 66-10 % PO SOLN
15.0000 mL | Freq: Once | ORAL | Status: AC
  Administered 2015-06-10: 15 mL via ORAL

## 2015-06-10 MED ORDER — LABETALOL HCL 5 MG/ML IV SOLN
10.0000 mg | Freq: Once | INTRAVENOUS | Status: AC
  Administered 2015-06-10: 10 mg via INTRAVENOUS
  Filled 2015-06-10: qty 4

## 2015-06-10 MED ORDER — NITROGLYCERIN 0.3 MG/HR TD PT24
0.3000 mg | MEDICATED_PATCH | Freq: Every day | TRANSDERMAL | Status: DC
  Administered 2015-06-10: 0.3 mg via TRANSDERMAL
  Filled 2015-06-10 (×3): qty 1

## 2015-06-10 MED ORDER — ONDANSETRON HCL 4 MG/2ML IJ SOLN
4.0000 mg | Freq: Once | INTRAMUSCULAR | Status: AC
  Administered 2015-06-10: 4 mg via INTRAVENOUS
  Filled 2015-06-10: qty 2

## 2015-06-10 MED ORDER — LISINOPRIL 10 MG PO TABS
10.0000 mg | ORAL_TABLET | Freq: Two times a day (BID) | ORAL | Status: DC
  Administered 2015-06-10 – 2015-06-12 (×3): 10 mg via ORAL
  Filled 2015-06-10 (×3): qty 1

## 2015-06-10 MED ORDER — SPIRONOLACTONE 25 MG PO TABS
12.5000 mg | ORAL_TABLET | Freq: Every day | ORAL | Status: DC
  Administered 2015-06-12: 12.5 mg via ORAL
  Filled 2015-06-10: qty 1

## 2015-06-10 MED ORDER — CARVEDILOL 12.5 MG PO TABS
12.5000 mg | ORAL_TABLET | Freq: Two times a day (BID) | ORAL | Status: DC
  Administered 2015-06-10 – 2015-06-12 (×4): 12.5 mg via ORAL
  Filled 2015-06-10 (×2): qty 1
  Filled 2015-06-10: qty 2

## 2015-06-10 MED ORDER — NITROGLYCERIN 2 % TD OINT
TOPICAL_OINTMENT | TRANSDERMAL | Status: AC
  Filled 2015-06-10: qty 1

## 2015-06-10 MED ORDER — SODIUM CHLORIDE 0.9% FLUSH
3.0000 mL | Freq: Two times a day (BID) | INTRAVENOUS | Status: DC
  Administered 2015-06-10 – 2015-06-12 (×3): 3 mL via INTRAVENOUS

## 2015-06-10 MED ORDER — SODIUM CHLORIDE 0.9 % IV SOLN: 250.0000 mL | INTRAVENOUS | Status: DC | PRN

## 2015-06-10 MED ORDER — SODIUM CHLORIDE 0.9% FLUSH: 3.0000 mL | INTRAVENOUS | Status: DC | PRN

## 2015-06-10 MED ORDER — HYDROMORPHONE HCL 1 MG/ML IJ SOLN: 1.0000 mg | INTRAMUSCULAR | Status: DC | PRN

## 2015-06-10 NOTE — ED Provider Notes (Signed)
  Physical Exam  BP 181/112 mmHg  Pulse 82  Temp(Src) 97.7 F (36.5 C) (Oral)  Resp 16  Ht 5\' 7"  (1.702 m)  Wt 280 lb (127.007 kg)  BMI 43.84 kg/m2  SpO2 96%  Physical Exam  ED Course  Procedures  MDM Patient care assumed at sign out from Dr. Dolores Frame at 7 am. Patient here with abdominal pain, chest pain, uncontrolled BP (not taking meds for several days). Patient Ran out of blood pressure medicines for several days and was noted to have periumbilical pain. Signed out pending CT abdomen and pelvis, blood pressure control as well as possible admission. CT showed ventral hernia with likely incarceration with pelvic fluid. On exam, obese, has mild swelling around the umbilicus and palpable ventral hernia that is painful to palpation and not reducible. BP still elevated 190s after hydralazine. Patient on multiple BP meds. Consulted Dr. Excell Seltzer from surgery, who plans to operate on him. Recommend medicine consult for BP management and preop clearance. Consulted Dr. Elpidio Anis from medicine.    Richardean Canal, MD 06/10/15 [id number]

## 2015-06-10 NOTE — Progress Notes (Signed)
Patient admitted to 235 from ED.  Alert oriented denies pain.  Oriented to room and unit routines. Safety plan reviewed.  Will continue to monitor

## 2015-06-10 NOTE — Progress Notes (Signed)
Pt requested that RN remove nitro patch due to it causing a headache. Nitro patch removed. Will continue to monitor.   Mayra Neer M

## 2015-06-10 NOTE — Consult Note (Signed)
Cardiology Consultation Note  Patient ID: Noah Rodriguez, MRN: [id number], DOB/AGE: [birth year] 40 y.o. Admit date: 06/10/2015   Date of Consult: 06/10/2015 Primary Physician: Sanda Linger, MD Primary Cardiologist: Bishop Limbo, MD Requesting Physician: Kathie Rhodes. Sudini, MD  Chief Complaint: Abdominal pain Reason for Consult: Surgical clearance at family's request   HPI: 40 y.o. male with h/o NICM with normal coronary arteries by cardiac cath 09/2014, chronic combined CHF, hypertensive heart disease, obesity, OSA noncompliant with CPAP, and medication noncompliance who presented to [facility] with an incarcerated ventral hernia containing omentum. Cardiology is consulted at the family's request for pre-operative evaluation.   He is followed by Dr. Nicki Guadalajara, MD and last saw him on 12/30/2014. He has known cardiomegaly since at least 2013. In 2014, echo Doppler study showed an EF of 35-40% with moderate LVH and grade 1 diastolic dysfunction. He was found to have untreated sleep apnea at that time and started on CPAP, though he is not compliant with this. Earlier in 2016, he did run out of his medications for over 5 months in August was admitted with symptoms of increasing shortness of breath and CHF.Medical therapy was reinstituted.A nuclear perfusion study was interpreted as high risk and demonstrated a small focus of reversible ischemia in the apical mid ventricle as well as a region of prior infarct/scarring in the inferior inferolateral walls. Ejection fraction was 26% and he had marked LV dilation with global hypokinesis.This was followed up with a right and left heart catheterization in 09/2014 by Dr. Tresa Endo, MD. This revealed elevation of right heart pressures with mild pulmonary hypertension, severe LV dysfunction with an ejection fraction of 20-25%, and normal coronary arteries. His CHF medications have been up-titrated in the office. He has not been felt to be a candidate for ICD at this time given the  need to up-titrate his CHF medications along with his medication noncompliance.   Patient most recently developed 2 day history of abdominal pain. He initially though the was constipated and took some magnesium citrate without help. His pain continued to get worse causing him to come to the ED. He never had any chest pain, SOB, nausea, vomiting, dizziness, presyncope, or syncope. He reports being out of his medications for the past 2 days, though upon looking at his medication bottles, he has not been out.  Upon the patient's arrival to [facility] they were found to have severely elevated blood pressure with reading of 206/125. He had been out of his medications for 2 days, he reports. He was started on IV labetalol, hydralazine prn, and continued on his home medications of lisinopril, Coreg, spironolactone, and lasix. Most recent BP reading in the room was 171/106 at the end of his cardiology consult with scheduled IV labetalol to be given now. He was without any chest pain, SOB, palpitations, presyncope, dizziness, or syncope. ECG showed NSR, 77 bpm LVH, poor R wave progression, nonspecific IVCD, nonspecific inferior st changes, TWI V5 and V6. CXR showed hypoventilation with unchanged cardiomegaly and bronchovascular crowding without pulmonary edema. CT abdomen and pelvis showed ventral hernia with inflammatory changes raising the concern for incarcerated ventral hernia continuing omentum. There was no bowel within the hernia. Surgery is planned for late PM today or 5/31 AM, pending improvement of his BP.    Past Medical History  Diagnosis Date  . Hypertensive heart disease   . Asthma   . Sleep apnea     a. sleep study 05/2010; b. noncompliant with CPAP  . 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 Chronic combined systolic and diastolic CHF (congestive heart failure) (HCC)     EF < 25% on Cath (09/17/2014)  . Hypokalemia   . Acute kidney injury (HCC)   . Gout of big toe 09/16/2014  . MI  (myocardial infarction) (HCC)     2016  . NICM (nonischemic cardiomyopathy) (HCC)     a. cardiac cath 09/2014 with normal coronary arteries, EF 20-25% with severe global HK; b. echo 08/2014: EF 30-35%, false tendon in LV aepx of no clinical sig, diffuse HK, GR1DD, aortic root 40 mm, trivial MR, trivial pericardial effusion posterior   . Ventral hernia     a. incarcerated omentum 06/10/2015  . H/O medication noncompliance       Most Recent Cardiac Studies: Right and left heart catheterization 09/17/2014: Conclusion     There is severe left ventricular systolic dysfunction.  Elevation of right heart pressures with mild pulmonary hypertension.  Left ventricular dilatation with severe global hypo-contractility and an ejection fraction of 20-25%.  Normal coronary arteries.  RECOMMENDATION:  The patient will continue to be treated with titrating doses of ace inhibition, beta blocker, amlodipine, aldosterone blockade and diuretic therapy. Consider initial placement of a LifeVest in light of his severely dysfunction with reassessment of LV function in several months.   Echo 09/10/2014: Study Conclusions  - Left ventricle: False tendon in LV apex of no clinical  significance. The cavity size was normal. There was moderate  concentric hypertrophy. Systolic function was moderately to  severely reduced. The estimated ejection fraction was in the  range of 30% to 35%. Diffuse hypokinesis. There was an increased  relative contribution of atrial contraction to ventricular  filling. Doppler parameters are consistent with abnormal left  ventricular relaxation (grade 1 diastolic dysfunction). - Aorta: Aortic root dimension: 40 mm (ED). - Aortic root: The aortic root was mildly dilated. - Mitral valve: There was trivial regurgitation. - Pericardium, extracardiac: A trivial pericardial effusion was  identified posterior to the heart.   Surgical History:  Past Surgical History  Procedure  Laterality Date  . Cardiac catheterization N/A 09/17/2014    Procedure: Right/Left Heart Cath and Coronary Angiography;  Surgeon: Lennette Bihari, MD;  Location: [facility] INVASIVE CV LAB;  Service: Cardiovascular;  Laterality: N/A;     Home Meds: Prior to Admission medications   Medication Sig Start Date End Date Taking? Authorizing Provider  albuterol (PROVENTIL HFA;VENTOLIN HFA) 108 (90 BASE) MCG/ACT inhaler Inhale 2 puffs into the lungs every 6 (six) hours as needed for wheezing or shortness of breath. 01/16/14  Yes Etta Grandchild, MD  amLODipine (NORVASC) 2.5 MG tablet Take 2.5 mg by mouth daily.   Yes Historical Provider, MD  carvedilol (COREG) 12.5 MG tablet Take 12.5 mg by mouth 2 (two) times daily with a meal.   Yes Historical Provider, MD  furosemide (LASIX) 40 MG tablet Take 1 tablet (40 mg total) by mouth 2 (two) times daily. 09/20/14  Yes Ellsworth Lennox, PA  lisinopril (PRINIVIL,ZESTRIL) 10 MG tablet Take 10 mg by mouth 2 (two) times daily.   Yes Historical Provider, MD  spironolactone (ALDACTONE) 25 MG tablet Take 12.5 mg by mouth daily.   Yes Historical Provider, MD    Inpatient Medications:  . amLODipine  2.5 mg Oral Daily  . aspirin EC  81 mg Oral Daily  . carvedilol  12.5 mg Oral BID WC  . heparin  5,000 Units Subcutaneous Q8H  . labetalol  10 mg Intravenous  TID  . lisinopril  10 mg Oral BID  . nitroGLYCERIN  0.3 mg Transdermal Daily  . sodium chloride flush  3 mL Intravenous Q12H  . sodium chloride flush  3 mL Intravenous Q12H  . spironolactone  12.5 mg Oral Daily   . sodium chloride      Allergies: No Known Allergies  Social History   Social History  . Marital Status: Married    Spouse Name: N/A  . Number of Children: N/A  . Years of Education: N/A   Occupational History  . Not on file.   Social History Main Topics  . Smoking status: Never Smoker   . Smokeless tobacco: Never Used  . Alcohol Use: No  . Drug Use: No  . Sexual Activity: Yes    Birth Control/  Protection: Condom   Other Topics Concern  . Not on file   Social History Narrative     Family History  Problem Relation Age of Onset  . HIV Mother   . Hypertension Other   . Alcohol abuse Neg Hx   . Cancer Neg Hx   . Early death Neg Hx   . Heart disease Neg Hx   . Hyperlipidemia Neg Hx   . Kidney disease Neg Hx   . Stroke Neg Hx   . Heart attack Neg Hx   . Hypertension Maternal Grandmother   . Hypertension Mother   . Hypertension Paternal Grandmother      Review of Systems: Review of Systems  Constitutional: Positive for malaise/fatigue. Negative for fever, chills, weight loss and diaphoresis.  HENT: Negative for congestion.   Eyes: Negative for discharge and redness.  Respiratory: Negative for cough, hemoptysis, sputum production, shortness of breath and wheezing.   Cardiovascular: Positive for leg swelling. Negative for chest pain, palpitations, orthopnea, claudication and PND.  Gastrointestinal: Positive for abdominal pain. Negative for heartburn, nausea, vomiting, diarrhea, constipation, blood in stool and melena.  Musculoskeletal: Negative for myalgias and falls.  Skin: Negative for rash.  Neurological: Positive for weakness. Negative for dizziness, sensory change, speech change, focal weakness and loss of consciousness.  Endo/Heme/Allergies: Does not bruise/bleed easily.  Psychiatric/Behavioral: The patient is not nervous/anxious.   All other systems reviewed and are negative.   Labs:  Recent Labs  06/10/15 0600  TROPONINI 0.03   Lab Results  Component Value Date   WBC 6.7 06/10/2015   HGB 13.8 06/10/2015   HCT 41.4 06/10/2015   MCV 82.1 06/10/2015   PLT 283 06/10/2015     Recent Labs Lab 06/10/15 0600  NA 139  K 3.5  CL 103  CO2 30  BUN 18  CREATININE 1.29*  CALCIUM 8.6*  PROT 7.3  BILITOT 0.5  ALKPHOS 67  ALT 46  AST 30  GLUCOSE 117*   Lab Results  Component Value Date   CHOL 141 09/10/2014   HDL 31* 09/10/2014   LDLCALC 95  09/10/2014   TRIG 77 09/10/2014   No results found for: DDIMER  Radiology/Studies:  Dg Chest 2 View  06/10/2015  CLINICAL DATA:  Acute onset of chest and epigastric pain on the way to work this morning. Nausea. EXAM: CHEST  2 VIEW COMPARISON:  09/09/2014 FINDINGS: Despite repeat acquisition, lung volumes are low. Cardiomegaly and mediastinal contours are unchanged. Bronchovascular crowding without pulmonary edema. No confluent airspace disease, pleural effusion or pneumothorax. Intact osseous structures. IMPRESSION: Hypoventilatory chest. Unchanged cardiomegaly. There is bronchovascular crowding, no pulmonary edema. Electronically Signed   By: Lujean Rave.D.  On: 06/10/2015 05:10   Ct Abdomen Pelvis W Contrast  06/10/2015  CLINICAL DATA:  Chest pain started 2 days. Constipation. Knot in the center of the abdomen and question hernia. EXAM: CT ABDOMEN AND PELVIS WITH CONTRAST TECHNIQUE: Multidetector CT imaging of the abdomen and pelvis was performed using the standard protocol following bolus administration of intravenous contrast. CONTRAST:  ISOVUE-300 IOPAMIDOL (ISOVUE-300) INJECTION 61% COMPARISON:  None. FINDINGS: Lower chest:  Lung bases are clear. Hepatobiliary: Liver parenchyma is low-density and suggestive for hepatic steatosis. Portal venous system is patent. Normal appearance of the gallbladder. Pancreas: Normal appearance of the pancreas without inflammation or duct dilatation. Spleen: Normal appearance of spleen without enlargement. Adrenals/Urinary Tract: Normal adrenal glands. Normal appearance of both kidneys without hydronephrosis. Normal appearance of urinary bladder. Question a tiny cyst along the left kidney lower pole. This area is too small to definitively characterize. Stomach/Bowel: No acute abnormality to the stomach or small bowel. Normal appearance of the appendix. No acute abnormality to the colon. Vascular/Lymphatic: No significant atherosclerotic disease in the  abdomen or pelvis. Small lymph nodes in the inguinal regions are nonspecific. Overall, there is no suspicious lymphadenopathy. Reproductive: No acute abnormality to the prostate or seminal vesicles. Other: Small amount of free fluid in the pelvis. There is a supraumbilical ventral hernia fat and probably omentum. The omentum just cephalad to the ventral hernia has mild stranding and findings suggest inflammation. In addition, there appears to be non dependent fluid within this ventral hernia sac. Musculoskeletal: No acute bone abnormality. IMPRESSION: Ventral hernia with inflammatory changes and a small amount of fluid in the pelvis. Findings raise concern for an incarcerated ventral hernia containing omentum. No bowel within the ventral hernia. Hepatic steatosis. Electronically Signed   By: Richarda Overlie M.D.   On: 06/10/2015 08:45    EKG: NSR, 77 bpm LVH, poor R wave progression, nonspecific IVCD, nonspecific inferior st changes, TWI V5 and V6  Weights: Filed Weights   06/10/15 0400  Weight: 280 lb (127.007 kg)     Physical Exam: Blood pressure 167/99, pulse 79, temperature 97.7 F (36.5 C), temperature source Oral, resp. rate 16, height 5\' 7"  (1.702 m), weight 280 lb (127.007 kg), SpO2 93 %. Body mass index is 43.84 kg/(m^2). General: Well developed, well nourished, in no acute distress. Head: Normocephalic, atraumatic, sclera non-icteric, no xanthomas, nares are without discharge.  Neck: Negative for carotid bruits. JVD not elevated. Lungs: Clear bilaterally to auscultation without wheezes, rales, or rhonchi. Breathing is unlabored. Heart: RRR with S1 S2. No murmurs, rubs, or gallops appreciated. Abdomen: Obese, soft, tender, non-distended with normoactive bowel sounds. No hepatomegaly. No rebound/guarding. No obvious abdominal masses. Msk:  Strength and tone appear normal for age. Extremities: No clubbing or cyanosis. No edema.  Distal pedal pulses are 2+ and equal bilaterally. Neuro: Alert  and oriented X 3. No facial asymmetry. No focal deficit. Moves all extremities spontaneously. Psych:  Responds to questions appropriately with a normal affect.    Assessment and Plan:  Principal Problem:   Incarcerated ventral hernia Active Problems:   Hypertensive heart disease   Ventral hernia   Sleep apnea   Nonischemic cardiomyopathy (HCC)   H/O medication noncompliance   Obesity, Class III, BMI 40-49.9 (morbid obesity) (HCC)    1. Surgical clearance: -Once his blood pressure is better controlled he would be cleared at a moderate risk for non-cardiac surgery  -Case was discussed with Im/surgery by Dr. Mariah Milling, MD -Possible surgery later this afternoon or 5/31 AM given  only omentum is incarcerated  -Limit peri-operative IV fluids given his cardiomyopathy   2. Malignant HTN/hypertenisve heart disease: -Improving with IV labetalol and prn hydralazine -Add nitro paste -Continue remaining home antihypertensives -Compliance in all aspects of his healthcare are needed. Patient reports being out of his medications for the past 2 days, though when reviewing his medication bottles this was not found to be the case   3. NICM/chronic combined CHF: -He does not appear to be volume overloaded -Reports being out of his medications x 2 days, has a history of medication noncompliance and running out of his medications -Continue Coreg, lisinopril, spironolactone  -Has been previously evaluated for ICD, though it was felt he needed to titrate CHF medications to optimum level as well as demonstrate compliance  -No need for echo at this time given no acute cardiac issue -Recent cardiac cath as above, no plans for ischemic evaluation at this time -No plans for ICD or LifeVest this admission at this time  4. Obesity/sleep apnea: -Noncompliant with CPAP at home -Advised to follow up with Dr. Tresa Endo, MD for CPAP needs   Signed, Eula Listen, PA-C Pager: [phone] 06/10/2015, 10:43 AM

## 2015-06-10 NOTE — ED Notes (Signed)
Patient was on his way to work this morning when he started having chest pain/epigastric pain. States nausea without vomiting. States he broke out in a cold sweat at the time of onset.

## 2015-06-10 NOTE — Progress Notes (Signed)
Patient feels better this afternoon he had no nausea vomiting and is passing gas. His pain is much better but still present.  Vital signs reviewed Abdomen is soft and minimally tender no palpable mass no erythema no peritoneal signs Calves are nontender  This patient with incarcerated ventral hernia I suspect that it has at least partially reduced itself spontaneously as his pain is improved and he states that the area is smaller or less tender or both. Recommended repair in the morning now that his blood pressure is better controlled. I discussed with him the potential for mesh placement versus primary repair I discussed with him the advantages of either or each individually area also discussed risks of bleeding infection recurrence mesh placement mesh infection this reviewed for him he understood and agreed to proceed. We'll schedule for first INR morning.

## 2015-06-10 NOTE — ED Notes (Addendum)
Pt reports that chest pain started 2 days ago constipation - he took medication for constipation today and has had a BM today prior to taking the medication - Pt reports the chest pain has gotten worse and per pt his abd is distended with a knot in the center of his abd - pt dx with CHF last year

## 2015-06-10 NOTE — Consult Note (Signed)
[facility] [place] [facility]   PATIENT NAME: Noah Rodriguez    MR#:  [id number]  DATE OF BIRTH:  09/15/1975  DATE OF ADMISSION:  06/10/2015  PRIMARY CARE PHYSICIAN: Sanda Linger, MD   CONSULT REQUESTING/REFERRING PHYSICIAN: Dr. Silverio Lay - ED  REASON FOR CONSULT: Accelerated HTN  CHIEF COMPLAINT:   Chief Complaint  Patient presents with  . Chest Pain  . Abdominal Pain    HISTORY OF PRESENT ILLNESS:  Noah Rodriguez  is a 40 y.o. male with a known history of Nonischemic cardiomyopathy with ejection fraction of 25%, hypertension presents to the emergency room complaining of abdominal pain of 2 days. He has had nausea but no vomiting. Had a small bowel movement yesterday. In the emergency room he has also been found to have blood pressure elevated at systolic of 220. Patient mentions he ran out of. Her medications for 2 days now. He has been compliant with medications prior to this. He complains of epigastric pain and periumbilical and lower abdominal pain. CT scan of the abdomen and pelvis has shown an incarcerated ventral hernia. He feels more comfortable after some pain medications in the emergency room. Plan is for the surgical team to admit the patient and taken to the operating room later once his blood pressures improved. Hospitalist team consulted for blood pressure control and cardiomyopathy. Patient follows with Dr. Tresa Endo of cardiology at [facility]. He had normal cardiac catheterization in 2016. No stents. Ejection fraction 25%. Plan was to optimize medications and place AICD there is no improvement in ejection fraction. No history of V. Tach. He does have chronic shortness of breath and mild lower extremity edema which is unchanged.  PAST MEDICAL HISTORY:   Past Medical History  Diagnosis Date  . Hypertension   . Asthma   . Sleep apnea     sleep study 05/2010  . Obesity, Class III, BMI 40-49.9 (morbid obesity) (HCC)   . Acute  on chronic systolic and diastolic heart failure, NYHA class 3 (HCC)     EF < 25% on Cath (09/17/2014)  . Hypokalemia   . Acute kidney injury (HCC)   . Gout of big toe 09/16/2014  . MI (myocardial infarction) (HCC)     2016    PAST SURGICAL HISTOIRY:   Past Surgical History  Procedure Laterality Date  . Cardiac catheterization N/A 09/17/2014    Procedure: Right/Left Heart Cath and Coronary Angiography;  Surgeon: Lennette Bihari, MD;  Location: [facility] INVASIVE CV LAB;  Service: Cardiovascular;  Laterality: N/A;    SOCIAL HISTORY:   Social History  Substance Use Topics  . Smoking status: Never Smoker   . Smokeless tobacco: Never Used  . Alcohol Use: No    FAMILY HISTORY:   Family History  Problem Relation Age of Onset  . HIV Mother   . Hypertension Other   . Alcohol abuse Neg Hx   . Cancer Neg Hx   . Early death Neg Hx   . Heart disease Neg Hx   . Hyperlipidemia Neg Hx   . Kidney disease Neg Hx   . Stroke Neg Hx   . Heart attack Neg Hx   . Hypertension Maternal Grandmother   . Hypertension Mother   . Hypertension Paternal Grandmother     DRUG ALLERGIES:  No Known Allergies  REVIEW OF SYSTEMS:   ROS  CONSTITUTIONAL: No fever, fatigue or weakness.  EYES: No blurred or double vision.  EARS, NOSE, AND THROAT: No tinnitus  or ear pain.  RESPIRATORY: No cough, shortness of breath, wheezing or hemoptysis.  CARDIOVASCULAR: No chest pain, orthopnea, edema.  GASTROINTESTINAL: Nausea and abd pain.  GENITOURINARY: No dysuria, hematuria.  ENDOCRINE: No polyuria, nocturia,  HEMATOLOGY: No anemia, easy bruising or bleeding SKIN: No rash or lesion. MUSCULOSKELETAL: No joint pain or arthritis.   NEUROLOGIC: No tingling, numbness, weakness.  PSYCHIATRY: No anxiety or depression.   MEDICATIONS AT HOME:   Prior to Admission medications   Medication Sig Start Date End Date Taking? Authorizing Provider  albuterol (PROVENTIL HFA;VENTOLIN HFA) 108 (90 BASE) MCG/ACT inhaler Inhale 2  puffs into the lungs every 6 (six) hours as needed for wheezing or shortness of breath. 01/16/14  Yes Etta Grandchild, MD  amLODipine (NORVASC) 2.5 MG tablet Take 2.5 mg by mouth daily.   Yes Historical Provider, MD  carvedilol (COREG) 12.5 MG tablet Take 12.5 mg by mouth 2 (two) times daily with a meal.   Yes Historical Provider, MD  furosemide (LASIX) 40 MG tablet Take 1 tablet (40 mg total) by mouth 2 (two) times daily. 09/20/14  Yes Ellsworth Lennox, PA  lisinopril (PRINIVIL,ZESTRIL) 10 MG tablet Take 10 mg by mouth 2 (two) times daily.   Yes Historical Provider, MD  spironolactone (ALDACTONE) 25 MG tablet Take 12.5 mg by mouth daily.   Yes Historical Provider, MD      VITAL SIGNS:  Blood pressure 195/85, pulse 85, temperature 97.7 F (36.5 C), temperature source Oral, resp. rate 16, height  (1.702 m), weight 127.007 kg (280 lb), SpO2 95 %.  PHYSICAL EXAMINATION:  GENERAL:  40 y.o.-year-old patient lying in the bed with no acute distress. Obese EYES: Pupils equal, round, reactive to light and accommodation. No scleral icterus. Extraocular muscles intact.  HEENT: Head atraumatic, normocephalic. Oropharynx and nasopharynx clear.  NECK:  Supple, no jugular venous distention. No thyroid enlargement, no tenderness.  LUNGS: Normal breath sounds bilaterally, no wheezing, rales,rhonchi or crepitation. No use of accessory muscles of respiration.  CARDIOVASCULAR: S1, S2 normal. No murmurs, rubs, or gallops.  ABDOMEN: Soft. Diffuse abdominal tenderness. No rigidity or guarding. Bowel sounds decreased. EXTREMITIES: No pedal edema, cyanosis, or clubbing.  NEUROLOGIC: Cranial nerves II through XII are intact. Muscle strength 5/5 in all extremities. Sensation intact. Gait not checked.  PSYCHIATRIC: The patient is alert and oriented x 3.  SKIN: No obvious rash, lesion, or ulcer.   LABORATORY PANEL:   CBC  Recent Labs Lab 06/10/15 0439  WBC 6.7  HGB 13.8  HCT 41.4  PLT 283    ------------------------------------------------------------------------------------------------------------------  Chemistries   Recent Labs Lab 06/10/15 0600  NA 139  K 3.5  CL 103  CO2 30  GLUCOSE 117*  BUN 18  CREATININE 1.29*  CALCIUM 8.6*  AST 30  ALT 46  ALKPHOS 67  BILITOT 0.5   ------------------------------------------------------------------------------------------------------------------  Cardiac Enzymes  Recent Labs Lab 06/10/15 0600  TROPONINI 0.03   ------------------------------------------------------------------------------------------------------------------  RADIOLOGY:  Dg Chest 2 View  06/10/2015  CLINICAL DATA:  Acute onset of chest and epigastric pain on the way to work this morning. Nausea. EXAM: CHEST  2 VIEW COMPARISON:  09/09/2014 FINDINGS: Despite repeat acquisition, lung volumes are low. Cardiomegaly and mediastinal contours are unchanged. Bronchovascular crowding without pulmonary edema. No confluent airspace disease, pleural effusion or pneumothorax. Intact osseous structures. IMPRESSION: Hypoventilatory chest. Unchanged cardiomegaly. There is bronchovascular crowding, no pulmonary edema. Electronically Signed   By: Rubye Oaks M.D.   On: 06/10/2015 05:10   Ct Abdomen Pelvis W  [phone] MeFransisco HerDenyce RMedical Center At Elizabeth PlacebertherrillDalphine HandingStark161Yevette EdwardsoJohney [id number] Crease Kitchen-1829Denyce RLogan MENT>[id number] TEXTTAG>G> XTTAG>AG>ristin tin 5 oristine Section16109 96Gilda [phone] FrameYevette Edwards161-0960Metropolitan Hospitalenyce RobertFransisco [phone] [phone] Beach ShoresDalphine Handingarstow Community Hospital [phone] FilterPurvis SheffieldMayford [phone] Ronie [phone]  Marland [phone] [phone] Doristine Section16109 Yevette [phone] MeadAve [phone] 096Gilda [phone] FrameYevette Edwards161-0960[facility] Hospital-AkronDenyce RobertFransisco [phone] [phone] HillsDalphine HandingOutpatient Surgery Center Of Hilton Head [phone] FilterPurvis SheffieldMayford [phone] Ronie [phone]  Marland [phone] [phone] Doristine Section16109 096Gilda [phone] FrameYevette Edwards161-0960[facility] Center IncDenyce RobertFransisco [phone] [phone] CoveDalphine HandingLv Surgery Ctr LLC [phone] FilterPurvis SheffieldMayford [phone] Ronie [phone]  

## 2015-06-10 NOTE — Progress Notes (Signed)
BP still high. Added Labetalol IV 10 mg TID. Hydralazine IV PRN as he is NPO. WIll need BP improved before surgery. Will see patient and add full consult.

## 2015-06-10 NOTE — ED Provider Notes (Signed)
[facility] Emergency Department Provider Note   ____________________________________________  Time seen: Approximately 4:27 AM  I have reviewed the triage vital signs and the nursing notes.   HISTORY  Chief Complaint Chest Pain and Abdominal Pain    HPI Noah Rodriguez is a 40 y.o. male who presents to the ED from home with a chief complaint of chest and abdominal pain. Reports epigastric abdominal pain for the past 2 days which has been constant and unchanged with food. This morning he was on his way to work when the pain radiated into his chest. Symptoms associated with nausea without vomiting. States he remained short of breath at baseline. Patient also complains of "knot" to belly button area that he has not noticed before this morning. Also complains of constipation; took laxative with production of small bowel movement. Denies recent fever, chills, cough, dysuria. Denies recent travel or trauma. Nothing makes his pain better or worse.   Past Medical History  Diagnosis Date  . Hypertension   . Asthma   . Sleep apnea     sleep study 05/2010  . Obesity, Class III, BMI 40-49.9 (morbid obesity) (HCC)   . Acute on chronic systolic and diastolic heart failure, NYHA class 3 (HCC)     EF < 25% on Cath (09/17/2014)  . Hypokalemia   . Acute kidney injury (HCC)   . Gout of big toe 09/16/2014  . MI (myocardial infarction) (HCC)     2016    Patient Active Problem List   Diagnosis Date Noted  . Chronic combined systolic and diastolic heart failure, NYHA class 3 (HCC) 09/27/2014  . Gout 09/27/2014  . Essential hypertension 09/27/2014  . Elevated troponin I level   . Abnormal cardiac function test 09/15/2014  . Non compliance w medication regimen 09/15/2014  . Chest pain   . Acute on chronic combined systolic and diastolic CHF, NYHA class 3 (HCC) 09/11/2014  . Nonischemic cardiomyopathy (HCC) 09/11/2014  . AKI (acute kidney injury) (HCC) 09/09/2014  .  Hypokalemia 09/09/2014  . Elevated troponin 09/09/2014  . Epigastric pain 09/09/2014  . Obesity, Class III, BMI 40-49.9 (morbid obesity) (HCC)   . Sleep apnea   . Hyperglycemia 01/16/2014  . Routine general medical examination at a health care facility 01/16/2014  . Cough 01/16/2014  . Cardiomegaly 01/02/2012  . Mild persistent asthma 12/31/2011  . Hypertensive heart disease 11/26/2009  . CHRONIC GINGIVITIS PLAQUE INDUCED 11/26/2009    Past Surgical History  Procedure Laterality Date  . Cardiac catheterization N/A 09/17/2014    Procedure: Right/Left Heart Cath and Coronary Angiography;  Surgeon: Lennette Bihari, MD;  Location: [facility] INVASIVE CV LAB;  Service: Cardiovascular;  Laterality: N/A;    Current Outpatient Rx  Name  Route  Sig  Dispense  Refill  . albuterol (PROVENTIL HFA;VENTOLIN HFA) 108 (90 BASE) MCG/ACT inhaler   Inhalation   Inhale 2 puffs into the lungs every 6 (six) hours as needed for wheezing or shortness of breath.   1 Inhaler   11   . amLODipine (NORVASC) 2.5 MG tablet   Oral   Take 1 tablet (2.5 mg total) by mouth daily.   30 tablet   6   . aspirin EC 81 MG EC tablet   Oral   Take 1 tablet (81 mg total) by mouth daily.         . beclomethasone (QVAR) 40 MCG/ACT inhaler   Inhalation   Inhale 2 puffs into the lungs 2 (two) times daily.  1 Inhaler   12   . carvedilol (COREG) 25 MG tablet   Oral   Take 1 tablet (25 mg total) by mouth 2 (two) times daily.   180 tablet   3   . furosemide (LASIX) 40 MG tablet   Oral   Take 1 tablet (40 mg total) by mouth 2 (two) times daily.   60 tablet   6   . lisinopril (PRINIVIL,ZESTRIL) 20 MG tablet   Oral   Take 1 tablet (20 mg total) by mouth 2 (two) times daily.   180 tablet   3   . spironolactone (ALDACTONE) 25 MG tablet   Oral   Take 0.5 tablets (12.5 mg total) by mouth 2 (two) times daily.   30 tablet   6     Allergies Review of patient's allergies indicates no known allergies.  Family  History  Problem Relation Age of Onset  . HIV Mother   . Hypertension Other   . Alcohol abuse Neg Hx   . Cancer Neg Hx   . Early death Neg Hx   . Heart disease Neg Hx   . Hyperlipidemia Neg Hx   . Kidney disease Neg Hx   . Stroke Neg Hx   . Heart attack Neg Hx   . Hypertension Maternal Grandmother   . Hypertension Mother   . Hypertension Paternal Grandmother     Social History Social History  Substance Use Topics  . Smoking status: Never Smoker   . Smokeless tobacco: Never Used  . Alcohol Use: No    Review of Systems  Constitutional: No fever/chills. Eyes: No visual changes. ENT: No sore throat. Cardiovascular: Positive for chest pain. Respiratory: Denies shortness of breath. Gastrointestinal: Positive for abdominal pain and nausea, no vomiting.  No diarrhea.  Positive for constipation. Genitourinary: Negative for dysuria. Musculoskeletal: Negative for back pain. Skin: Negative for rash. Neurological: Negative for headaches, focal weakness or numbness.  10-point ROS otherwise negative.  ____________________________________________   PHYSICAL EXAM:  VITAL SIGNS: ED Triage Vitals  Enc Vitals Group     BP 06/10/15 0400 206/125 mmHg     Pulse Rate 06/10/15 0400 79     Resp 06/10/15 0400 22     Temp 06/10/15 0400 97.7 F (36.5 C)     Temp Source 06/10/15 0400 Oral     SpO2 06/10/15 0400 97 %     Weight 06/10/15 0400 280 lb (127.007 kg)     Height 06/10/15 0400 5\' 7"  (1.702 m)     Head Cir --      Peak Flow --      Pain Score 06/10/15 0401 10     Pain Loc --      Pain Edu? --      Excl. in GC? --     Constitutional: Alert and oriented. Well appearing and in no acute distress. Eyes: Conjunctivae are normal. PERRL. EOMI. Head: Atraumatic. Nose: No congestion/rhinnorhea. Mouth/Throat: Mucous membranes are moist.  Oropharynx non-erythematous. Neck: No stridor.   Cardiovascular: Normal rate, regular rhythm. Grossly normal heart sounds.  Good peripheral  circulation. Respiratory: Normal respiratory effort.  No retractions. Lungs CTAB. Gastrointestinal: Soft and mildly tender to palpation epigastrium, right upper quadrant, umbilicus. Small palpable mass noted superior to umbilicus. No distention. No abdominal bruits. No CVA tenderness. Musculoskeletal: No lower extremity tenderness nor edema.  No joint effusions. Neurologic:  Normal speech and language. No gross focal neurologic deficits are appreciated. No gait instability. Skin:  Skin is warm, dry and  intact. No rash noted. Psychiatric: Mood and affect are normal. Speech and behavior are normal.  ____________________________________________   LABS (all labs ordered are listed, but only abnormal results are displayed)  Labs Reviewed  URINALYSIS COMPLETEWITH MICROSCOPIC ([facility] ONLY) - Abnormal; Notable for the following:    Color, Urine YELLOW (*)    APPearance CLEAR (*)    Hgb urine dipstick 1+ (*)    All other components within normal limits  CBC WITH DIFFERENTIAL/PLATELET  BRAIN NATRIURETIC PEPTIDE  COMPREHENSIVE METABOLIC PANEL  TROPONIN I  LIPASE, BLOOD   ____________________________________________  EKG  ED ECG REPORT I, SUNG,JADE J, the attending physician, personally viewed and interpreted this ECG.   Date: 06/10/2015  EKG Time: 0406  Rate: 77  Rhythm: normal EKG, normal sinus rhythm  Axis: Normal  Intervals:none  ST&T Change: T-wave inversion in lateral leads. No significant change from 12/2014 ____________________________________________  RADIOLOGY  Chest 2 view (reviewed by me, interpreted per Dr. Manus Gunning): Hypoventilatory chest. Unchanged cardiomegaly. There is bronchovascular crowding, no pulmonary edema. ____________________________________________   PROCEDURES  Procedure(s) performed: None  Critical Care performed: No  ____________________________________________   INITIAL IMPRESSION / ASSESSMENT AND PLAN / ED COURSE  Pertinent labs & imaging  results that were available during my care of the patient were reviewed by me and considered in my medical decision making (see chart for details).  40 year old male with a history of CHF who presents with abdominal and chest pain. Will obtain screening lab work including LFTs, lipase. Palpable mass noted at umbilicus; question incarcerated hernia. Will administer IV analgesia and obtain CT abdomen/pelvis to evaluate etiology of patient's abdominal pain. States his diastolic blood pressure is frequently 120s.  ----------------------------------------- 7:05 AM on 06/10/2015 -----------------------------------------  Patient sleeping soundly in no acute distress. Chemistries hemolyzed and were redrawn and sent. Updated patient and spouse on CBC and urinalysis results as well as results of chest x-ray. CT abdomen/pelvis is pending. Patient remains hypertensive; will administer IV hydralazine. Care transferred to Dr. Silverio Lay.  ____________________________________________   FINAL CLINICAL IMPRESSION(S) / ED DIAGNOSES  Final diagnoses:  Chest pain, unspecified chest pain type  Generalized abdominal pain  Essential hypertension      NEW MEDICATIONS STARTED DURING THIS VISIT:  New Prescriptions   No medications on file     Note:  This document was prepared using Dragon voice recognition software and may include unintentional dictation errors.    Irean Hong, MD 06/10/15 [phone]

## 2015-06-10 NOTE — H&P (Signed)
Noah Rodriguez is an 40 y.o. male.    Chief Complaint: Abdominal pain  HPI: 2-3 days of abdominal pain and he points to the right side of his umbilicus as a source of his pain. He states it is much better now and in fact he states that the "knot" is much smaller now and less tender. He is had no nausea or vomiting and is passing gas and having normal bowel movements. He is frequently constipated however has had no melena or hematochezia. Nice fevers or chills and is never had an episode like this before.  He has been seen by cardiology and internal medicine for control of his hypertension prior to any surgical intervention and I have discussed his care with both insulted's.  Past Medical History  Diagnosis Date  . Hypertensive heart disease   . Asthma   . Sleep apnea     a. sleep study 05/2010; b. noncompliant with CPAP  . Obesity, Class III, BMI 40-49.9 (morbid obesity) ([place])   . Chronic combined systolic and diastolic CHF (congestive heart failure) (HCC)     EF < 25% on Cath (09/17/2014)  . Hypokalemia   . Acute kidney injury ([place])   . Gout of big toe 09/16/2014  . MI (myocardial infarction) ([place])     2016  . NICM (nonischemic cardiomyopathy) ([place])     a. cardiac cath 09/2014 with normal coronary arteries, EF 20-25% with severe global HK; b. echo 08/2014: EF 30-35%, false tendon in LV aepx of no clinical sig, diffuse HK, GR1DD, aortic root 40 mm, trivial MR, trivial pericardial effusion posterior   . Ventral hernia     a. incarcerated omentum 06/10/2015    Past Surgical History  Procedure Laterality Date  . Cardiac catheterization N/A 09/17/2014    Procedure: Right/Left Heart Cath and Coronary Angiography;  Surgeon: Troy Sine, MD;  Location: [facility];  Service: Cardiovascular;  Laterality: N/A;    Family History  Problem Relation Age of Onset  . HIV Mother   . Hypertension Other   . Alcohol abuse Neg Hx   . Cancer Neg Hx   . Early death Neg Hx   . Heart disease  Neg Hx   . Hyperlipidemia Neg Hx   . Kidney disease Neg Hx   . Stroke Neg Hx   . Heart attack Neg Hx   . Hypertension Maternal Grandmother   . Hypertension Mother   . Hypertension Paternal Grandmother    Social History:  reports that he has never smoked. He has never used smokeless tobacco. He reports that he does not drink alcohol or use illicit drugs.  Allergies: No Known Allergies   (Not in a hospital admission)   Review of Systems  Constitutional: Negative for fever, chills and weight loss.  HENT: Negative.   Eyes: Negative.   Respiratory: Negative.   Cardiovascular: Positive for chest pain. Negative for palpitations and orthopnea.  Gastrointestinal: Positive for abdominal pain. Negative for heartburn, nausea, vomiting, diarrhea, constipation, blood in stool and melena.  Genitourinary: Negative.   Musculoskeletal: Negative.   Skin: Negative.   Neurological: Negative.   Endo/Heme/Allergies: Negative.   Psychiatric/Behavioral: Negative.      Physical Exam:  BP 167/99 mmHg  Pulse 79  Temp(Src) 97.7 F (36.5 C) (Oral)  Resp 16  Ht _0  (1.702 m)  Wt 280 lb (127.007 kg)  BMI 43.84 kg/m2  SpO2 93%  Physical Exam  Constitutional: He is oriented to person, place, and time and well-developed,  well-nourished, and in no distress. No distress.  Morbidly obese in no acute distress appears comfortable  HENT:  Head: Normocephalic and atraumatic.  Eyes: Pupils are equal, round, and reactive to light. Right eye exhibits no discharge. Left eye exhibits no discharge. No scleral icterus.  Neck: Normal range of motion.  Cardiovascular: Normal rate, regular rhythm and normal heart sounds.   Pulmonary/Chest: Effort normal and breath sounds normal. No respiratory distress. He has no wheezes. He has no rales.  Abdominal: Soft. He exhibits no distension. There is no tenderness. There is no rebound and no guarding.  No palpable hernia in the area identified by the patient (he states  that it is better and the knot is smaller. (  Abdomen otherwise soft and nontender no peritoneal signs  Musculoskeletal: Normal range of motion. He exhibits no edema or tenderness.  Lymphadenopathy:    He has no cervical adenopathy.  Neurological: He is alert and oriented to person, place, and time.  Skin: Skin is warm and dry. He is not diaphoretic. No erythema.  Psychiatric: Mood and affect normal.  Vitals reviewed.       Results for orders placed or performed during the hospital encounter of 06/10/15 (from the past 48 hour(s))  Brain natriuretic peptide     Status: None   Collection Time: 06/10/15  4:35 AM  Result Value Ref Range   B Natriuretic Peptide 54.0 0.0 - 100.0 pg/mL  CBC with Differential     Status: None   Collection Time: 06/10/15  4:39 AM  Result Value Ref Range   WBC 6.7 3.8 - 10.6 K/uL   RBC 5.04 4.40 - 5.90 MIL/uL   Hemoglobin 13.8 13.0 - 18.0 g/dL   HCT 41.4 40.0 - 52.0 %   MCV 82.1 80.0 - 100.0 fL   MCH 27.3 26.0 - 34.0 pg   MCHC 33.2 32.0 - 36.0 g/dL   RDW 14.0 11.5 - 14.5 %   Platelets 283 150 - 440 K/uL   Neutrophils Relative % 71 %   Neutro Abs 4.8 1.4 - 6.5 K/uL   Lymphocytes Relative 20 %   Lymphs Abs 1.3 1.0 - 3.6 K/uL   Monocytes Relative 7 %   Monocytes Absolute 0.5 0.2 - 1.0 K/uL   Eosinophils Relative 1 %   Eosinophils Absolute 0.1 0 - 0.7 K/uL   Basophils Relative 1 %   Basophils Absolute 0.0 0 - 0.1 K/uL  Urinalysis complete, with microscopic ([facility] only)     Status: Abnormal   Collection Time: 06/10/15  5:40 AM  Result Value Ref Range   Color, Urine YELLOW (A) YELLOW   APPearance CLEAR (A) CLEAR   Glucose, UA NEGATIVE NEGATIVE mg/dL   Bilirubin Urine NEGATIVE NEGATIVE   Ketones, ur NEGATIVE NEGATIVE mg/dL   Specific Gravity, Urine 1.016 1.005 - 1.030   Hgb urine dipstick 1+ (A) NEGATIVE   pH 7.0 5.0 - 8.0   Protein, ur NEGATIVE NEGATIVE mg/dL   Nitrite NEGATIVE NEGATIVE   Leukocytes, UA NEGATIVE NEGATIVE   RBC / HPF 0-5 0 - 5  RBC/hpf   WBC, UA 0-5 0 - 5 WBC/hpf   Bacteria, UA NONE SEEN NONE SEEN   Squamous Epithelial / LPF NONE SEEN NONE SEEN   Mucous PRESENT   Comprehensive metabolic panel     Status: Abnormal   Collection Time: 06/10/15  6:00 AM  Result Value Ref Range   Sodium 139 135 - 145 mmol/L   Potassium 3.5 3.5 - 5.1 mmol/L  Chloride 103 101 - 111 mmol/L   CO2 30 22 - 32 mmol/L   Glucose, Bld 117 (H) 65 - 99 mg/dL   BUN 18 6 - 20 mg/dL   Creatinine, Ser 1.29 (H) 0.61 - 1.24 mg/dL   Calcium 8.6 (L) 8.9 - 10.3 mg/dL   Total Protein 7.3 6.5 - 8.1 g/dL   Albumin 4.1 3.5 - 5.0 g/dL   AST 30 15 - 41 U/L   ALT 46 17 - 63 U/L   Alkaline Phosphatase 67 38 - 126 U/L   Total Bilirubin 0.5 0.3 - 1.2 mg/dL   GFR calc non Af Amer >60 >60 mL/min   GFR calc Af Amer >60 >60 mL/min    Comment: (NOTE) The eGFR has been calculated using the CKD EPI equation. This calculation has not been validated in all clinical situations. eGFR's persistently <60 mL/min signify possible Chronic Kidney Disease.    Anion gap 6 5 - 15  Troponin I     Status: None   Collection Time: 06/10/15  6:00 AM  Result Value Ref Range   Troponin I 0.03 <0.031 ng/mL    Comment:        NO INDICATION OF MYOCARDIAL INJURY.   Lipase, blood     Status: None   Collection Time: 06/10/15  6:00 AM  Result Value Ref Range   Lipase 24 11 - 51 U/L   Dg Chest 2 View  06/10/2015  CLINICAL DATA:  Acute onset of chest and epigastric pain on the way to work this morning. Nausea. EXAM: CHEST  2 VIEW COMPARISON:  09/09/2014 FINDINGS: Despite repeat acquisition, lung volumes are low. Cardiomegaly and mediastinal contours are unchanged. Bronchovascular crowding without pulmonary edema. No confluent airspace disease, pleural effusion or pneumothorax. Intact osseous structures. IMPRESSION: Hypoventilatory chest. Unchanged cardiomegaly. There is bronchovascular crowding, no pulmonary edema. Electronically Signed   By: Jeb Levering M.D.   On: 06/10/2015  05:10   Ct Abdomen Pelvis W Contrast  06/10/2015  CLINICAL DATA:  Chest pain started 2 days. Constipation. Knot in the center of the abdomen and question hernia. EXAM: CT ABDOMEN AND PELVIS WITH CONTRAST TECHNIQUE: Multidetector CT imaging of the abdomen and pelvis was performed using the standard protocol following bolus administration of intravenous contrast. CONTRAST:  174m ISOVUE-300 IOPAMIDOL (ISOVUE-300) INJECTION 61% COMPARISON:  None. FINDINGS: Lower chest:  Lung bases are clear. Hepatobiliary: Liver parenchyma is low-density and suggestive for hepatic steatosis. Portal venous system is patent. Normal appearance of the gallbladder. Pancreas: Normal appearance of the pancreas without inflammation or duct dilatation. Spleen: Normal appearance of spleen without enlargement. Adrenals/Urinary Tract: Normal adrenal glands. Normal appearance of both kidneys without hydronephrosis. Normal appearance of urinary bladder. Question a tiny cyst along the left kidney lower pole. This area is too small to definitively characterize. Stomach/Bowel: No acute abnormality to the stomach or small bowel. Normal appearance of the appendix. No acute abnormality to the colon. Vascular/Lymphatic: No significant atherosclerotic disease in the abdomen or pelvis. Small lymph nodes in the inguinal regions are nonspecific. Overall, there is no suspicious lymphadenopathy. Reproductive: No acute abnormality to the prostate or seminal vesicles. Other: Small amount of free fluid in the pelvis. There is a supraumbilical ventral hernia fat and probably omentum. The omentum just cephalad to the ventral hernia has mild stranding and findings suggest inflammation. In addition, there appears to be non dependent fluid within this ventral hernia sac. Musculoskeletal: No acute bone abnormality. IMPRESSION: Ventral hernia with inflammatory changes and a small amount  of fluid in the pelvis. Findings raise concern for an incarcerated ventral hernia  containing omentum. No bowel within the ventral hernia. Hepatic steatosis. Electronically Signed   By: Markus Daft M.D.   On: 06/10/2015 08:45     Assessment/Plan  This patient with a history of abdominal pain and a workup showing probable incarcerated omentum and a small supraumbilical ventral hernia. CT scan is been personally reviewed. Subjectively the patient appears better and feels better and states that the "knot" is smaller in size and much improved. With that in mind I have discussed with cardiology and internal medicine urgent control of his hypertension and planned surgical intervention once that is complete which could likely be performed as early as tomorrow morning he is in no acute distress at this time there is no bowel involved in this hernia and temporal improvement of his hypertension is indicated prior to surgery. This was discussed with him. I also discussed with his family mesh placement mesh infection procedure of ventral hernia repair primarily etc. all of which would be made at the time of surgery and decisions made based on his hernia location and type.  Florene Glen, MD, FACS

## 2015-06-10 NOTE — ED Notes (Signed)
Lab called and green top tube had hemolized and needed to be redrawn

## 2015-06-11 ENCOUNTER — Inpatient Hospital Stay: Payer: 59 | Admitting: Anesthesiology

## 2015-06-11 ENCOUNTER — Encounter
Admission: EM | Disposition: A | Discharge status: Home / Self Care | Payer: Self-pay | Source: Home / Self Care | Attending: Surgery

## 2015-06-11 ENCOUNTER — Encounter: Payer: Self-pay | Admitting: Surgery

## 2015-06-11 DIAGNOSIS — K460 Unspecified abdominal hernia with obstruction, without gangrene: Secondary | ICD-10-CM

## 2015-06-11 HISTORY — PX: VENTRAL HERNIA REPAIR: SHX424

## 2015-06-11 LAB — CBC
HEMATOCRIT: 41 % (ref 40.0–52.0)
Hemoglobin: 13.4 g/dL (ref 13.0–18.0)
MCH: 27.1 pg (ref 26.0–34.0)
MCHC: 32.8 g/dL (ref 32.0–36.0)
MCV: 82.8 fL (ref 80.0–100.0)
Platelets: 277 10*3/uL (ref 150–440)
RBC: 4.95 MIL/uL (ref 4.40–5.90)
RDW: 14.1 % (ref 11.5–14.5)
WBC: 6 10*3/uL (ref 3.8–10.6)

## 2015-06-11 LAB — BASIC METABOLIC PANEL
ANION GAP: 5 (ref 5–15)
BUN: 14 mg/dL (ref 6–20)
CHLORIDE: 103 mmol/L (ref 101–111)
CO2: 30 mmol/L (ref 22–32)
Calcium: 8.6 mg/dL — ABNORMAL LOW (ref 8.9–10.3)
Creatinine, Ser: 1.21 mg/dL (ref 0.61–1.24)
GFR calc non Af Amer: >60 mL/min (ref >60)
Glucose, Bld: 101 mg/dL — ABNORMAL HIGH (ref 65–99)
POTASSIUM: 3.5 mmol/L (ref 3.5–5.1)
SODIUM: 138 mmol/L (ref 135–145)

## 2015-06-11 SURGERY — REPAIR, HERNIA, VENTRAL
Anesthesia: Choice

## 2015-06-11 SURGERY — REPAIR, HERNIA, VENTRAL
Anesthesia: General | Wound class: Clean Contaminated

## 2015-06-11 MED ORDER — FENTANYL CITRATE (PF) 100 MCG/2ML IJ SOLN
25.0000 ug | INTRAMUSCULAR | Status: DC | PRN
  Administered 2015-06-11: 25 ug via INTRAVENOUS

## 2015-06-11 MED ORDER — CARVEDILOL 12.5 MG PO TABS
ORAL_TABLET | ORAL | Status: AC
  Filled 2015-06-11: qty 1

## 2015-06-11 MED ORDER — FENTANYL CITRATE (PF) 100 MCG/2ML IJ SOLN
INTRAMUSCULAR | Status: AC
  Filled 2015-06-11: qty 2

## 2015-06-11 MED ORDER — MIDAZOLAM HCL 2 MG/2ML IJ SOLN
INTRAMUSCULAR | Status: DC | PRN
  Administered 2015-06-11: 2 mg via INTRAVENOUS

## 2015-06-11 MED ORDER — ONDANSETRON HCL 4 MG/2ML IJ SOLN: 4.0000 mg | Freq: Once | INTRAMUSCULAR | Status: DC | PRN

## 2015-06-11 MED ORDER — ROCURONIUM BROMIDE 100 MG/10ML IV SOLN
INTRAVENOUS | Status: DC | PRN
  Administered 2015-06-11 (×2): 10 mg via INTRAVENOUS

## 2015-06-11 MED ORDER — LACTATED RINGERS IV SOLN
INTRAVENOUS | Status: DC | PRN
  Administered 2015-06-11: 07:00:00 via INTRAVENOUS

## 2015-06-11 MED ORDER — NEOSTIGMINE METHYLSULFATE 10 MG/10ML IV SOLN
INTRAVENOUS | Status: DC | PRN
  Administered 2015-06-11: 1 mg via INTRAVENOUS

## 2015-06-11 MED ORDER — BUPIVACAINE-EPINEPHRINE (PF) 0.25% -1:200000 IJ SOLN
INTRAMUSCULAR | Status: DC | PRN
  Administered 2015-06-11: 30 mL

## 2015-06-11 MED ORDER — CEFAZOLIN SODIUM-DEXTROSE 2-4 GM/100ML-% IV SOLN
2.0000 g | INTRAVENOUS | Status: AC
  Administered 2015-06-11: 2 g via INTRAVENOUS
  Filled 2015-06-11: qty 100

## 2015-06-11 MED ORDER — KETOROLAC TROMETHAMINE 30 MG/ML IJ SOLN
INTRAMUSCULAR | Status: DC | PRN
  Administered 2015-06-11: 15 mg via INTRAVENOUS

## 2015-06-11 MED ORDER — LIDOCAINE HCL (CARDIAC) 20 MG/ML IV SOLN
INTRAVENOUS | Status: DC | PRN
  Administered 2015-06-11: 100 mg via INTRAVENOUS

## 2015-06-11 MED ORDER — GLYCOPYRROLATE 0.2 MG/ML IJ SOLN
INTRAMUSCULAR | Status: DC | PRN
  Administered 2015-06-11 (×2): 0.2 mg via INTRAVENOUS

## 2015-06-11 MED ORDER — SUCCINYLCHOLINE CHLORIDE 20 MG/ML IJ SOLN
INTRAMUSCULAR | Status: DC | PRN
  Administered 2015-06-11: 100 mg via INTRAVENOUS

## 2015-06-11 MED ORDER — POTASSIUM CHLORIDE CRYS ER 20 MEQ PO TBCR: 40.0000 meq | EXTENDED_RELEASE_TABLET | ORAL | Status: DC

## 2015-06-11 MED ORDER — FENTANYL CITRATE (PF) 100 MCG/2ML IJ SOLN
INTRAMUSCULAR | Status: DC | PRN
Start: 2015-06-11 — End: 2015-06-11
  Administered 2015-06-11: 100 ug via INTRAVENOUS

## 2015-06-11 MED ORDER — OXYCODONE-ACETAMINOPHEN 5-325 MG PO TABS
1.0000 | ORAL_TABLET | ORAL | Status: DC | PRN
  Administered 2015-06-11 – 2015-06-12 (×4): 1 via ORAL
  Filled 2015-06-11 (×4): qty 1

## 2015-06-11 MED ORDER — PROPOFOL 10 MG/ML IV BOLUS
INTRAVENOUS | Status: DC | PRN
  Administered 2015-06-11: 150 mg via INTRAVENOUS

## 2015-06-11 SURGICAL SUPPLY — 31 items
ADHESIVE MASTISOL STRL (MISCELLANEOUS) IMPLANT
CANISTER SUCT 1200ML W/VALVE (MISCELLANEOUS) ×3 IMPLANT
CHLORAPREP W/TINT 26ML (MISCELLANEOUS) ×3 IMPLANT
CLOSURE WOUND 1/2 X4 (GAUZE/BANDAGES/DRESSINGS)
DRAPE LAPAROTOMY 100X77 ABD (DRAPES) ×3 IMPLANT
ELECT REM PT RETURN 9FT ADLT (ELECTROSURGICAL) ×3
ELECTRODE REM PT RTRN 9FT ADLT (ELECTROSURGICAL) ×1 IMPLANT
ETHIBOND 2 0 GREEN CT 2 30IN (SUTURE) IMPLANT
GAUZE SPONGE 4X4 12PLY STRL (GAUZE/BANDAGES/DRESSINGS) ×3 IMPLANT
GLOVE BIO SURGEON STRL SZ8 (GLOVE) ×6 IMPLANT
GOWN STRL REUS W/ TWL LRG LVL3 (GOWN DISPOSABLE) ×2 IMPLANT
GOWN STRL REUS W/TWL LRG LVL3 (GOWN DISPOSABLE) ×4
KIT RM TURNOVER STRD PROC AR (KITS) ×3 IMPLANT
LABEL OR SOLS (LABEL) ×3 IMPLANT
MESH VENTRALEX ST 2.5 CRC MED (Mesh General) ×3 IMPLANT
NDL SAFETY 22GX1.5 (NEEDLE) ×3 IMPLANT
NS IRRIG 500ML POUR BTL (IV SOLUTION) ×3 IMPLANT
PACK BASIN MINOR ARMC (MISCELLANEOUS) ×3 IMPLANT
SPONGE LAP 18X18 5 PK (GAUZE/BANDAGES/DRESSINGS) ×3 IMPLANT
STAPLER SKIN PROX 35W (STAPLE) ×3 IMPLANT
STRIP CLOSURE SKIN 1/2X4 (GAUZE/BANDAGES/DRESSINGS) IMPLANT
SUT MNCRL 4-0 (SUTURE) ×2
SUT MNCRL 4-0 27XMFL (SUTURE) ×1
SUT PROLENE 0 CT 1 30 (SUTURE) ×9 IMPLANT
SUT VIC AB 0 CT2 27 (SUTURE) IMPLANT
SUT VIC AB 2-0 CT1 27 (SUTURE) ×4
SUT VIC AB 2-0 CT1 TAPERPNT 27 (SUTURE) ×2 IMPLANT
SUT VIC AB 3-0 SH 27 (SUTURE)
SUT VIC AB 3-0 SH 27X BRD (SUTURE) IMPLANT
SUTURE MNCRL 4-0 27XMF (SUTURE) ×1 IMPLANT
SYRINGE 10CC LL (SYRINGE) ×3 IMPLANT

## 2015-06-11 SURGICAL SUPPLY — 28 items
ADHESIVE MASTISOL STRL (MISCELLANEOUS) IMPLANT
CANISTER SUCT 1200ML W/VALVE (MISCELLANEOUS) IMPLANT
CHLORAPREP W/TINT 26ML (MISCELLANEOUS) IMPLANT
CLOSURE WOUND 1/2 X4 (GAUZE/BANDAGES/DRESSINGS)
DRAPE LAPAROTOMY 100X77 ABD (DRAPES) IMPLANT
ELECT REM PT RETURN 9FT ADLT (ELECTROSURGICAL)
ELECTRODE REM PT RTRN 9FT ADLT (ELECTROSURGICAL) IMPLANT
ETHIBOND 2 0 GREEN CT 2 30IN (SUTURE) IMPLANT
GAUZE SPONGE 4X4 12PLY STRL (GAUZE/BANDAGES/DRESSINGS) IMPLANT
GLOVE BIO SURGEON STRL SZ8 (GLOVE) IMPLANT
GOWN STRL REUS W/ TWL LRG LVL3 (GOWN DISPOSABLE) IMPLANT
GOWN STRL REUS W/TWL LRG LVL3 (GOWN DISPOSABLE)
KIT RM TURNOVER STRD PROC AR (KITS) IMPLANT
LABEL OR SOLS (LABEL) IMPLANT
NDL SAFETY 22GX1.5 (NEEDLE) IMPLANT
NS IRRIG 500ML POUR BTL (IV SOLUTION) IMPLANT
PACK BASIN MINOR ARMC (MISCELLANEOUS) IMPLANT
SPONGE LAP 18X18 5 PK (GAUZE/BANDAGES/DRESSINGS) IMPLANT
STAPLER SKIN PROX 35W (STAPLE) IMPLANT
STRIP CLOSURE SKIN 1/2X4 (GAUZE/BANDAGES/DRESSINGS) IMPLANT
SUT MNCRL 4-0 (SUTURE)
SUT MNCRL 4-0 27XMFL (SUTURE)
SUT PROLENE 0 CT 1 30 (SUTURE) IMPLANT
SUT VIC AB 0 CT2 27 (SUTURE) IMPLANT
SUT VIC AB 3-0 SH 27 (SUTURE)
SUT VIC AB 3-0 SH 27X BRD (SUTURE) IMPLANT
SUTURE MNCRL 4-0 27XMF (SUTURE) IMPLANT
SYRINGE 10CC LL (SYRINGE) IMPLANT

## 2015-06-11 NOTE — Anesthesia Postprocedure Evaluation (Signed)
Anesthesia Post Note  Patient: Noah Rodriguez  Procedure(s) Performed: Procedure(s) (LRB): HERNIA REPAIR VENTRAL ADULT (N/A)  Patient location during evaluation: PACU Anesthesia Type: General Level of consciousness: awake and alert Pain management: pain level controlled Vital Signs Assessment: post-procedure vital signs reviewed and stable Respiratory status: spontaneous breathing and respiratory function stable Cardiovascular status: stable Anesthetic complications: no    Last Vitals:  Filed Vitals:   06/11/15 0945 06/11/15 0950  BP:    Pulse: 68 56  Temp:    Resp: 16 16    Last Pain:  Filed Vitals:   06/11/15 0953  PainSc: 5                  KEPHART,WILLIAM K

## 2015-06-11 NOTE — Anesthesia Preprocedure Evaluation (Addendum)
Anesthesia Evaluation  Patient identified by MRN, date of birth, ID band Patient awake    Reviewed: Allergy & Precautions, NPO status , Patient's Chart, lab work & pertinent test results  History of Anesthesia Complications Negative for: history of anesthetic complications  Airway Mallampati: III       Dental  (+) Poor Dentition   Pulmonary neg pulmonary ROS, asthma , sleep apnea (not using) and Continuous Positive Airway Pressure Ventilation ,           Cardiovascular hypertension, Pt. on medications + Past MI and +CHF       Neuro/Psych negative neurological ROS     GI/Hepatic negative GI ROS, Neg liver ROS,   Endo/Other  negative endocrine ROS  Renal/GU negative Renal ROS     Musculoskeletal   Abdominal   Peds  Hematology negative hematology ROS (+)   Anesthesia Other Findings   Reproductive/Obstetrics                            Anesthesia Physical Anesthesia Plan  ASA: III  Anesthesia Plan: General   Post-op Pain Management:    Induction: Intravenous  Airway Management Planned: Oral ETT  Additional Equipment:   Intra-op Plan:   Post-operative Plan:   Informed Consent: I have reviewed the patients History and Physical, chart, labs and discussed the procedure including the risks, benefits and alternatives for the proposed anesthesia with the patient or authorized representative who has indicated his/her understanding and acceptance.     Plan Discussed with:   Anesthesia Plan Comments:         Anesthesia Quick Evaluation

## 2015-06-11 NOTE — Progress Notes (Signed)
[facility] [place] [facility]   PATIENT NAME: Noah Rodriguez    MR#:  [id number]  DATE OF BIRTH:  1975-09-01  SUBJECTIVE:  CHIEF COMPLAINT:   Chief Complaint  Patient presents with  . Chest Pain  . Abdominal Pain   Status post surgery for his ventral hernia. Blood pressure improved.  REVIEW OF SYSTEMS:    Review of Systems  Constitutional: Positive for malaise/fatigue. Negative for fever.  Respiratory: Negative for shortness of breath.   Cardiovascular: Negative for chest pain.  Gastrointestinal: Positive for abdominal pain. Negative for nausea and diarrhea.  Neurological: Negative for focal weakness and headaches.    DRUG ALLERGIES:  No Known Allergies  VITALS:  Blood pressure 119/78, pulse 67, temperature 97.5 F (36.4 C), temperature source Axillary, resp. rate 18, height 5\' 7"  (1.702 m), weight 146.013 kg (321 lb 14.4 oz), SpO2 100 %.  PHYSICAL EXAMINATION:   Physical Exam  GENERAL:  40 y.o.-year-old patient lying in the bed EYES: Pupils equal and reactive to light. No pallor or icterus. HEENT: Head atraumatic, normocephalic. Oropharynx and nasopharynx clear.  NECK:  Supple, no jugular venous distention. No thyroid enlargement, no tenderness.  LUNGS: Normal breath sounds bilaterally, no wheezing, rales, rhonchi. No use of accessory muscles of respiration.  CARDIOVASCULAR: S1, S2 normal. No murmurs, rubs, or gallops.  ABDOMEN: Soft, Tenderness around surgical site. Dressing over surgical scar. Bowel sounds decreased EXTREMITIES: No cyanosis, clubbing or edema b/l.    NEUROLOGIC: Moves all 4 extremities PSYCHIATRIC: The patient is Drowsy  LABORATORY PANEL:   CBC  Recent Labs Lab 06/11/15 0528  WBC 6.0  HGB 13.4  HCT 41.0  PLT 277   ------------------------------------------------------------------------------------------------------------------ Chemistries   Recent Labs Lab 06/10/15 0600 06/11/15 0528  NA 139 138   K 3.5 3.5  CL 103 103  CO2 30 30  GLUCOSE 117* 101*  BUN 18 14  CREATININE 1.29* 1.21  CALCIUM 8.6* 8.6*  AST 30  --   ALT 46  --   ALKPHOS 67  --   BILITOT 0.5  --    ------------------------------------------------------------------------------------------------------------------  Cardiac Enzymes  Recent Labs Lab 06/10/15 0600  TROPONINI 0.03   ------------------------------------------------------------------------------------------------------------------  RADIOLOGY:  Dg Chest 2 View  06/10/2015  CLINICAL DATA:  Acute onset of chest and epigastric pain on the way to work this morning. Nausea. EXAM: CHEST  2 VIEW COMPARISON:  09/09/2014 FINDINGS: Despite repeat acquisition, lung volumes are low. Cardiomegaly and mediastinal contours are unchanged. Bronchovascular crowding without pulmonary edema. No confluent airspace disease, pleural effusion or pneumothorax. Intact osseous structures. IMPRESSION: Hypoventilatory chest. Unchanged cardiomegaly. There is bronchovascular crowding, no pulmonary edema. Electronically Signed   By: Rubye Oaks M.D.   On: 06/10/2015 05:10   Ct Abdomen Pelvis W Contrast  06/10/2015  CLINICAL DATA:  Chest pain started 2 days. Constipation. Knot in the center of the abdomen and question hernia. EXAM: CT ABDOMEN AND PELVIS WITH CONTRAST TECHNIQUE: Multidetector CT imaging of the abdomen and pelvis was performed using the standard protocol following bolus administration of intravenous contrast. CONTRAST:  ISOVUE-300 IOPAMIDOL (ISOVUE-300) INJECTION 61% COMPARISON:  None. FINDINGS: Lower chest:  Lung bases are clear. Hepatobiliary: Liver parenchyma is low-density and suggestive for hepatic steatosis. Portal venous system is patent. Normal appearance of the gallbladder. Pancreas: Normal appearance of the pancreas without inflammation or duct dilatation. Spleen: Normal appearance of spleen without enlargement. Adrenals/Urinary Tract: Normal adrenal  glands. Normal appearance of both kidneys without hydronephrosis. Normal appearance of urinary bladder.  Question a tiny cyst along the left kidney lower pole. This area is too small to definitively characterize. Stomach/Bowel: No acute abnormality to the stomach or small bowel. Normal appearance of the appendix. No acute abnormality to the colon. Vascular/Lymphatic: No significant atherosclerotic disease in the abdomen or pelvis. Small lymph nodes in the inguinal regions are nonspecific. Overall, there is no suspicious lymphadenopathy. Reproductive: No acute abnormality to the prostate or seminal vesicles. Other: Small amount of free fluid in the pelvis. There is a supraumbilical ventral hernia fat and probably omentum. The omentum just cephalad to the ventral hernia has mild stranding and findings suggest inflammation. In addition, there appears to be non dependent fluid within this ventral hernia sac. Musculoskeletal: No acute bone abnormality. IMPRESSION: Ventral hernia with inflammatory changes and a small amount of fluid in the pelvis. Findings raise concern for an incarcerated ventral hernia containing omentum. No bowel within the ventral hernia. Hepatic steatosis. Electronically Signed   By: Richarda Overlie M.D.   On: 06/10/2015 08:45     ASSESSMENT AND PLAN:   * Incarcerated ventral hernia Status post surgery. On clear liquids.  * Acute hypoxic respiratory failure likely due to atelectasis after surgery. Ordered incentive spirometer.  * Accelerated hypertension Improved with restarting his home medications. Continue the same at discharge. Discontinue Nitropatch. Discontinue IV labetalol.  * Nonischemic cardiomyopathy with ejection fraction of 25-30% Patient had normal cardiac catheterization in 2016. Will not need any further cardiac tests. Appreciate cardiology input. Outpatient follow-up with Nicki Guadalajara M.D. at [facility] as needed. Limited IV fluids.  Management plans discussed with  the patient and in agreement.  DVT Prophylaxis: Heparin SQ  We'll sign off. Please call with questions.  TOTAL TIME TAKING CARE OF THIS PATIENT: 25 minutes.   Milagros Loll R M.D on 06/11/2015 at 12:40 PM  Between 7am to 6pm - Pager - [phone]  After 6pm go to [email or web address] - password [email or web address] [facility]  [facility]  Office  [phone]  CC: Primary care physician; Sanda Linger, MD  Note: This dictation was prepared with Dragon dictation along with smaller phrase technology. Any transcriptional errors that result from this process are unintentional.

## 2015-06-11 NOTE — Op Note (Signed)
Abdominal Hernia Repair  Pre-operative Diagnosis: Incarcerated ventral hernia  Post-operative Diagnosis: Incarcerated ventral hernia  Surgeon: Adah Salvage. Excell Seltzer, MD FACS  Anesthesia: Gen. with endotracheal tube  Assistant: PA student  Procedure: Repair of incarcerated ventral hernia with mesh  Procedure Details  The patient was seen again in the Holding Room. The benefits, complications, treatment options, and expected outcomes were discussed with the patient. The risks of bleeding, infection, recurrence of symptoms, failure to resolve symptoms, bowel injury, mesh placement, mesh infection, any of which could require further surgery were reviewed with the patient. The likelihood of improving the patient's symptoms with return to their baseline status is good.  The patient and/or family concurred with the proposed plan, giving informed consent.  The patient was taken to Operating Room, identified as Noah Rodriguez and the procedure verified.  A Time Out was held and the above information confirmed.  Prior to the induction of general anesthesia, antibiotic prophylaxis was administered. VTE prophylaxis was in place. General endotracheal anesthesia was then administered and tolerated well. After the induction, the abdomen was prepped with Chloraprep and draped in the sterile fashion. The patient was positioned in the supine position.  A midline incision was utilized to open and explore the subcutaneous tissues tissues. An incarcerated ventral hernia was identified in the supraumbilical area the fascial edges were cleaned and identified and the sac was opened there was some hemorrhagic compounded to the sac only but the omentum was not hemorrhagic and was reduced easily without difficulty. A second smaller hernia was identified adjacent to it and this was reduced as well it was not compromised. The preperitoneal space and back wall of the fascia was dissected bluntly to allow placement of a 6.4  cm ventral X patch.'s was held in with -0 Prolene figure-of-eight sutures. Additional Marcaine was placed for a total of 30 cc. The wound was inspected and hemostasis was adequate the wound was then closed in multiple layers of interrupted and figure-of-eight 0 Vicryls in multiple layers followed by skin staple placement  A sterile dressing was placed  Sponge lap needle count was correct he was taken the recovery room in stable condition to be admitted for continued care Noah Rodriguez on [employer]  Findings:  Incarcerated ventral hernia  Estimated Blood Loss: Minimal         Drains: None         Specimens: Portion of hemorrhagic hernia sac       Complications: none               Noah Rodriguez E. Excell Seltzer, MD, FACS

## 2015-06-11 NOTE — Progress Notes (Signed)
CC: [facility] Subjective: Patient feels better with less pain no nausea vomiting no fevers or chills had a bowel movement yesterday  Objective: Vital signs in last 24 hours: Temp:  [97.4 F (36.3 C)-98 F (36.7 C)] 97.5 F (36.4 C) (05/31 0433) Pulse Rate:  [57-85] 75 (05/31 0433) Resp:  [16-20] 20 (05/31 0433) BP: (127-195)/(62-112) 138/96 mmHg (05/31 0433) SpO2:  [93 %-98 %] 97 % (05/31 0433) Weight:  [321 lb 14.4 oz (146.013 kg)] 321 lb 14.4 oz (146.013 kg) (05/30 1158)    Intake/Output from previous day: 05/30 0701 - 05/31 0700 In: 763 [P.O.:760; I.V.:3] Out: 850 [Urine:850] Intake/Output this shift:    Physical exam:  Obese male no acute distress Vital signs reviewed and blood pressure much better controlled. Abdomen is soft nontender no mass appreciated Nontender calves Lab Results: CBC   Recent Labs  06/10/15 0439  WBC 6.7  HGB 13.8  HCT 41.4  PLT 283   BMET  Recent Labs  06/10/15 0600 06/11/15 0528  NA 139 138  K 3.5 3.5  CL 103 103  CO2 30 30  GLUCOSE 117* 101*  BUN 18 14  CREATININE 1.29* 1.21  CALCIUM 8.6* 8.6*   PT/INR No results for input(s): LABPROT, INR in the last 72 hours. ABG No results for input(s): PHART, HCO3 in the last 72 hours.  Invalid input(s): PCO2, PO2  Studies/Results: Dg Chest 2 View  06/10/2015  CLINICAL DATA:  Acute onset of chest and epigastric pain on the way to work this morning. Nausea. EXAM: CHEST  2 VIEW COMPARISON:  09/09/2014 FINDINGS: Despite repeat acquisition, lung volumes are low. Cardiomegaly and mediastinal contours are unchanged. Bronchovascular crowding without pulmonary edema. No confluent airspace disease, pleural effusion or pneumothorax. Intact osseous structures. IMPRESSION: Hypoventilatory chest. Unchanged cardiomegaly. There is bronchovascular crowding, no pulmonary edema. Electronically Signed   By: Rubye Oaks M.D.   On: 06/10/2015 05:10   Ct Abdomen Pelvis W Contrast  06/10/2015  CLINICAL  DATA:  Chest pain started 2 days. Constipation. Knot in the center of the abdomen and question hernia. EXAM: CT ABDOMEN AND PELVIS WITH CONTRAST TECHNIQUE: Multidetector CT imaging of the abdomen and pelvis was performed using the standard protocol following bolus administration of intravenous contrast. CONTRAST:  ISOVUE-300 IOPAMIDOL (ISOVUE-300) INJECTION 61% COMPARISON:  None. FINDINGS: Lower chest:  Lung bases are clear. Hepatobiliary: Liver parenchyma is low-density and suggestive for hepatic steatosis. Portal venous system is patent. Normal appearance of the gallbladder. Pancreas: Normal appearance of the pancreas without inflammation or duct dilatation. Spleen: Normal appearance of spleen without enlargement. Adrenals/Urinary Tract: Normal adrenal glands. Normal appearance of both kidneys without hydronephrosis. Normal appearance of urinary bladder. Question a tiny cyst along the left kidney lower pole. This area is too small to definitively characterize. Stomach/Bowel: No acute abnormality to the stomach or small bowel. Normal appearance of the appendix. No acute abnormality to the colon. Vascular/Lymphatic: No significant atherosclerotic disease in the abdomen or pelvis. Small lymph nodes in the inguinal regions are nonspecific. Overall, there is no suspicious lymphadenopathy. Reproductive: No acute abnormality to the prostate or seminal vesicles. Other: Small amount of free fluid in the pelvis. There is a supraumbilical ventral hernia fat and probably omentum. The omentum just cephalad to the ventral hernia has mild stranding and findings suggest inflammation. In addition, there appears to be non dependent fluid within this ventral hernia sac. Musculoskeletal: No acute bone abnormality. IMPRESSION: Ventral hernia with inflammatory changes and a small amount of fluid in the  pelvis. Findings raise concern for an incarcerated ventral hernia containing omentum. No bowel within the ventral hernia. Hepatic  steatosis. Electronically Signed   By: Richarda Overlie M.D.   On: 06/10/2015 08:45    Anti-infectives: Anti-infectives    Start     Dose/Rate Route Frequency Ordered Stop   06/11/15 0532  ceFAZolin (ANCEF) IVPB 2g/100 mL premix     2 g 200 mL/hr over 30 Minutes Intravenous 30 min pre-op 06/11/15 0532        Assessment/Plan:  Labs reviewed. Patient with incarcerated ventral hernia with vital signs much better controlled at this point recommend repair possibly with mesh versus primary repair. I discussed with him the options and the difference between the 2 procedures as well as the risks of bleeding infection recurrence mesh placement mesh infection he understood and agreed to proceed  Lattie Haw, MD, FACS  06/11/2015

## 2015-06-11 NOTE — Transfer of Care (Signed)
Immediate Anesthesia Transfer of Care Note  Patient: Noah Rodriguez  Procedure(s) Performed: Procedure(s): HERNIA REPAIR VENTRAL ADULT (N/A)  Patient Location: PACU  Anesthesia Type:General  Level of Consciousness: awake  Airway & Oxygen Therapy: Patient connected to nasal cannula oxygen  Post-op Assessment: Report given to RN  Post vital signs: stable  Last Vitals:  Filed Vitals:   06/11/15 0433 06/11/15 0829  BP: 138/96   Pulse: 75   Temp: 36.4 C 36.2 C  Resp: 20     Last Pain:  Filed Vitals:   06/11/15 0831  PainSc: 0-No pain         Complications: No apparent anesthesia complications

## 2015-06-11 NOTE — Anesthesia Procedure Notes (Signed)
Procedure Name: Intubation Date/Time: 06/11/2015 7:32 AM Performed by: Jannet Mantis Pre-anesthesia Checklist: Patient identified, Emergency Drugs available, Suction available and Patient being monitored Patient Re-evaluated:Patient Re-evaluated prior to inductionOxygen Delivery Method: Circle system utilized Preoxygenation: Pre-oxygenation with 100% oxygen Intubation Type: IV induction Ventilation: Oral airway inserted - appropriate to patient size Laryngoscope Size: Mac and 4 Grade View: Grade I Tube type: Oral Tube size: 7.0 mm Number of attempts: 1 Airway Equipment and Method: Patient positioned with wedge pillow,  Stylet and Oral airway Placement Confirmation: ETT inserted through vocal cords under direct vision Secured at: 20 cm Tube secured with: Tape Dental Injury: Teeth and Oropharynx as per pre-operative assessment

## 2015-06-12 LAB — SURGICAL PATHOLOGY

## 2015-06-12 MED ORDER — OXYCODONE-ACETAMINOPHEN 5-325 MG PO TABS
2.0000 | ORAL_TABLET | ORAL | Status: DC | PRN
  Administered 2015-06-12: 2 via ORAL
  Filled 2015-06-12: qty 2

## 2015-06-12 MED ORDER — OXYCODONE-ACETAMINOPHEN 5-325 MG PO TABS: 2.0000 | ORAL_TABLET | ORAL | Status: DC | PRN

## 2015-06-12 MED ORDER — OXYCODONE-ACETAMINOPHEN 5-325 MG PO TABS
1.0000 | ORAL_TABLET | Freq: Once | ORAL | Status: AC
  Administered 2015-06-12: 1 via ORAL
  Filled 2015-06-12: qty 1

## 2015-06-12 NOTE — Discharge Instructions (Signed)
Remove dressing in 24 hours. °May shower in 24 hours. °Leave staples in place. °Resume all home medications. °Follow-up with Dr. Bralee Feldt in 10 days. °

## 2015-06-12 NOTE — Progress Notes (Signed)
Patient discharge teaching given, including activity, diet, follow-up appoints, and medications. Patient verbalized understanding of all discharge instructions. IV access was d/c'd. Vitals are stable. Skin is intact except as charted in most recent assessments. Pt to be escorted out by volunteer, to be driven home by family.  Terrell Ostrand E Hobbs  

## 2015-06-12 NOTE — Discharge Summary (Signed)
Physician Discharge Summary  Patient ID: Noah Rodriguez MRN: [id number] DOB/AGE: [birth year] 40 y.o.  Admit date: 06/10/2015 Discharge date: 06/12/2015   Discharge Diagnoses:  Principal Problem:   Incarcerated ventral hernia Active Problems:   Hypertensive heart disease   Obesity, Class III, BMI 40-49.9 (morbid obesity) (HCC)   Sleep apnea   Nonischemic cardiomyopathy (HCC)   Ventral hernia   H/O medication noncompliance   ProceduresRepair of incarcerated ventral hernia with mesh  Hospital Course this patient admitted the hospital through the emergency room with an incarcerated ventral hernia with omentum present some of it spontaneously reduced and he was experiencing at extremely elevated blood pressure. Cardiology and internal medicine aided in management of his hot aggressive hypertension and once he was more stable he was taken the operating room where 2 separate hernias were identified in the supraumbilical area these were repaired with mesh. He made a non-, K to postoperative recovery is discharged in stable condition to follow-up in our office in 10 days  Consults: Internal medicine and cardiology  Disposition: 01-Home or Self Care     Medication List    ASK your doctor about these medications        albuterol 108 (90 Base) MCG/ACT inhaler  Commonly known as:  PROVENTIL HFA;VENTOLIN HFA  Inhale 2 puffs into the lungs every 6 (six) hours as needed for wheezing or shortness of breath.     amLODipine 2.5 MG tablet  Commonly known as:  NORVASC  Take 2.5 mg by mouth daily.     carvedilol 12.5 MG tablet  Commonly known as:  COREG  Take 12.5 mg by mouth 2 (two) times daily with a meal.     furosemide 40 MG tablet  Commonly known as:  LASIX  Take 1 tablet (40 mg total) by mouth 2 (two) times daily.     lisinopril 10 MG tablet  Commonly known as:  PRINIVIL,ZESTRIL  Take 10 mg by mouth 2 (two) times daily.     spironolactone 25 MG tablet  Commonly known as:   ALDACTONE  Take 12.5 mg by mouth daily.         Lattie Haw, MD, FACS

## 2015-06-12 NOTE — Progress Notes (Signed)
1 Day Post-Op  Subjective: As post repair of incarcerated ventral hernia with mesh. He is feeling well today with minimal pain.  Objective: Vital signs in last 24 hours: Temp:  [97.1 F (36.2 C)-97.9 F (36.6 C)] 97.8 F (36.6 C) (06/01 0749) Pulse Rate:  [54-95] 65 (06/01 0749) Resp:  [14-21] 16 (06/01 0545) BP: (119-183)/(65-126) 141/85 mmHg (06/01 0749) SpO2:  [87 %-100 %] 96 % (06/01 0749) Last BM Date:  (pt is unsure, it was before admission)  Intake/Output from previous day: 05/31 0701 - 06/01 0700 In: 1783 [P.O.:1230; I.V.:553] Out: 1177 [Urine:1175; Blood:2] Intake/Output this shift:    Physical exam:  Distressed abdomen is soft and minimally tender around the incision  Lab Results: CBC   Recent Labs  06/10/15 0439 06/11/15 0528  WBC 6.7 6.0  HGB 13.8 13.4  HCT 41.4 41.0  PLT 283 277   BMET  Recent Labs  06/10/15 0600 06/11/15 0528  NA 139 138  K 3.5 3.5  CL 103 103  CO2 30 30  GLUCOSE 117* 101*  BUN 18 14  CREATININE 1.29* 1.21  CALCIUM 8.6* 8.6*   PT/INR No results for input(s): LABPROT, INR in the last 72 hours. ABG No results for input(s): PHART, HCO3 in the last 72 hours.  Invalid input(s): PCO2, PO2  Studies/Results: Ct Abdomen Pelvis W Contrast  06/10/2015  CLINICAL DATA:  Chest pain started 2 days. Constipation. Knot in the center of the abdomen and question hernia. EXAM: CT ABDOMEN AND PELVIS WITH CONTRAST TECHNIQUE: Multidetector CT imaging of the abdomen and pelvis was performed using the standard protocol following bolus administration of intravenous contrast. CONTRAST:  ISOVUE-300 IOPAMIDOL (ISOVUE-300) INJECTION 61% COMPARISON:  None. FINDINGS: Lower chest:  Lung bases are clear. Hepatobiliary: Liver parenchyma is low-density and suggestive for hepatic steatosis. Portal venous system is patent. Normal appearance of the gallbladder. Pancreas: Normal appearance of the pancreas without inflammation or duct dilatation. Spleen:  Normal appearance of spleen without enlargement. Adrenals/Urinary Tract: Normal adrenal glands. Normal appearance of both kidneys without hydronephrosis. Normal appearance of urinary bladder. Question a tiny cyst along the left kidney lower pole. This area is too small to definitively characterize. Stomach/Bowel: No acute abnormality to the stomach or small bowel. Normal appearance of the appendix. No acute abnormality to the colon. Vascular/Lymphatic: No significant atherosclerotic disease in the abdomen or pelvis. Small lymph nodes in the inguinal regions are nonspecific. Overall, there is no suspicious lymphadenopathy. Reproductive: No acute abnormality to the prostate or seminal vesicles. Other: Small amount of free fluid in the pelvis. There is a supraumbilical ventral hernia fat and probably omentum. The omentum just cephalad to the ventral hernia has mild stranding and findings suggest inflammation. In addition, there appears to be non dependent fluid within this ventral hernia sac. Musculoskeletal: No acute bone abnormality. IMPRESSION: Ventral hernia with inflammatory changes and a small amount of fluid in the pelvis. Findings raise concern for an incarcerated ventral hernia containing omentum. No bowel within the ventral hernia. Hepatic steatosis. Electronically Signed   By: Richarda Overlie M.D.   On: 06/10/2015 08:45    Anti-infectives: Anti-infectives    Start     Dose/Rate Route Frequency Ordered Stop   06/11/15 0532  ceFAZolin (ANCEF) IVPB 2g/100 mL premix     2 g 200 mL/hr over 30 Minutes Intravenous 30 min pre-op 06/11/15 0532 06/11/15 0726      Assessment/Plan: s/p Procedure(s): HERNIA REPAIR VENTRAL ADULT   Patient doing very well this morning will advance  diet and discharged today to follow-up in 10 days  Lattie Haw, MD, FACS  06/12/2015

## 2015-06-26 ENCOUNTER — Encounter: Payer: Self-pay | Admitting: General Surgery

## 2015-06-26 ENCOUNTER — Ambulatory Visit (INDEPENDENT_AMBULATORY_CARE_PROVIDER_SITE_OTHER): Payer: 59 | Admitting: General Surgery

## 2015-06-26 VITALS — BP 137/87 | HR 76 | Temp 95.8°F | Ht 67.0 in | Wt 315.0 lb

## 2015-06-26 DIAGNOSIS — Z4889 Encounter for other specified surgical aftercare: Secondary | ICD-10-CM

## 2015-06-26 NOTE — Progress Notes (Signed)
Outpatient Surgical Follow Up  06/26/2015  Noah Rodriguez is an 40 y.o. male.   Chief Complaint  Patient presents with  . Routine Post Op    Ventral Hernia Repair-(DR.Cooper-06/11/15)    HPI: 40 year old male returns to clinic for follow-up proximal 2 weeks status post open ventral hernia repair. Patient reports minimal pain and not needing any pain medications currently. He is eating well and having normal bowel function. He has a strong desire to return to work. He denies any fevers, chills, nausea, vomiting, diarrhea, cons patient, chest pain, shortness of breath.  Past Medical History  Diagnosis Date  . Hypertensive heart disease   . Asthma   . Sleep apnea     a. sleep study 05/2010; b. noncompliant with CPAP  . Obesity, Class III, BMI 40-49.9 (morbid obesity) (HCC)   . Chronic combined systolic and diastolic CHF (congestive heart failure) (HCC)     EF < 25% on Cath (09/17/2014)  . Hypokalemia   . Acute kidney injury (HCC)   . Gout of big toe 09/16/2014  . NICM (nonischemic cardiomyopathy) (HCC)     a. cardiac cath 09/2014 with normal coronary arteries, EF 20-25% with severe global HK; b. echo 08/2014: EF 30-35%, false tendon in LV aepx of no clinical sig, diffuse HK, GR1DD, aortic root 40 mm, trivial MR, trivial pericardial effusion posterior   . Ventral hernia     a. incarcerated omentum 06/10/2015  . H/O medication noncompliance   . MI (myocardial infarction) [facility])     Patient denies having had a MI    Past Surgical History  Procedure Laterality Date  . Cardiac catheterization N/A 09/17/2014    Procedure: Right/Left Heart Cath and Coronary Angiography;  Surgeon: Lennette Bihari, MD;  Location: [facility] INVASIVE CV LAB;  Service: Cardiovascular;  Laterality: N/A;  . Ventral hernia repair N/A 06/11/2015    Procedure: HERNIA REPAIR VENTRAL ADULT;  Surgeon: Lattie Haw, MD;  Location: [facility] ORS;  Service: General;  Laterality: N/A;    Family History  Problem Relation Age of Onset   . HIV Mother   . Hypertension Other   . Alcohol abuse Neg Hx   . Cancer Neg Hx   . Early death Neg Hx   . Heart disease Neg Hx   . Hyperlipidemia Neg Hx   . Kidney disease Neg Hx   . Stroke Neg Hx   . Heart attack Neg Hx   . Hypertension Maternal Grandmother   . Hypertension Mother   . Hypertension Paternal Grandmother     Social History:  reports that he has never smoked. He has never used smokeless tobacco. He reports that he does not drink alcohol or use illicit drugs.  Allergies: No Known Allergies  Medications reviewed.    ROS  A multipoint review of systems was completed. All pertinent positives and negatives are documented in the history of present illness and remainder are negative.  BP 137/87 mmHg  Pulse 76  Temp(Src) 95.8 F (35.4 C) (Oral)  Ht 5\' 7"  (1.702 m)  Wt 142.883 kg (315 lb)  BMI 49.32 kg/m2  Physical Exam Gen.: No acute distress Chest: Clear to auscultation Heart: Regular rhythm Abdomen: Large, soft, appropriately tender to palpation by his primary repair, nondistended. Staples in place with hernia repair with good approximation of the tissues. The most caudad 1 cm of the incision site has some skin separation with visible healthy granulation tissue.    No results found for this or any previous visit (from  the past 48 hour(s)). No results found.  Assessment/Plan:  1. Aftercare following surgery 40 year old male 2 weeks status post ventral hernia repair. Doing well. Staples removed in clinic without difficulty and replaced with Steri-Strips. Due to the small amount of skin separation we will have him follow-up in clinic in 1 week to see his operating surgeon for an additional wound check. Discussed lifting restrictions for his return to work. He voiced understanding. Follow-up in 1 week.     Ricarda Frame, MD FACS General Surgeon  06/26/2015,10:01 AM

## 2015-06-26 NOTE — Patient Instructions (Addendum)
Please call our office with any questions or concerns.  Please do not submerge in a tub, hot tub, or pool until incisions are completely sealed.  Use sun block to incision area over the next year if this area will be exposed to sun. This helps decrease scarring.  You may now resume your normal activities. Listen to your body when lifting, if you have pain when lifting, stop and then try again in a few days.  If you develop redness, drainage, or pain at incision sites- call our office immediately and speak with a nurse.         Please see your appointment listed below.

## 2015-07-03 ENCOUNTER — Encounter: Payer: Self-pay | Admitting: Surgery

## 2015-07-03 ENCOUNTER — Ambulatory Visit (INDEPENDENT_AMBULATORY_CARE_PROVIDER_SITE_OTHER): Payer: 59 | Admitting: Surgery

## 2015-07-03 ENCOUNTER — Telehealth: Payer: Self-pay

## 2015-07-03 VITALS — BP 150/100 | HR 77 | Temp 95.7°F | Ht 67.0 in | Wt 321.0 lb

## 2015-07-03 DIAGNOSIS — Z4889 Encounter for other specified surgical aftercare: Secondary | ICD-10-CM

## 2015-07-03 NOTE — Telephone Encounter (Signed)
Patient's [employer] Disability Form was filled out and faxed.  Patient was called and notified that his [employer] disability form was filled out and faxed. Patient requested a copy. I told him that I would leave it at the front desk. Patient agreed.

## 2015-07-03 NOTE — Progress Notes (Signed)
Outpatient postop visit  07/03/2015  Noah Rodriguez is an 40 y.o. male.    Procedure: Ventral hernia repair with mesh  CC: No pain  HPI: This patient status post ventral hernia repair with mesh he has no complaints at this time he is working on light duty. He is eating and having no other problems.  Medications reviewed.    Physical Exam:  BP 150/100 mmHg  Pulse 77  Temp(Src) 95.7 F (35.4 C) (Oral)  Ht 5\' 7"  (1.702 m)  Wt 321 lb (145.605 kg)  BMI 50.26 kg/m2    PE: Sub-centimeter area of wound separation which is granulating well no erythema no drainage this is treated with silver nitrate. Otherwise the abdomen is soft and the wounds healing well    Assessment/Plan:  Patient doing very well recommend follow up on an as-needed basis.  Lattie Haw, MD, FACS

## 2015-07-17 ENCOUNTER — Telehealth: Payer: Self-pay | Admitting: Cardiovascular Disease

## 2015-07-17 NOTE — Telephone Encounter (Signed)
No answer, goes to VM.  Left msg for [facility] case manager w typically requested information and request to return call if further inquiries.

## 2015-07-17 NOTE — Telephone Encounter (Signed)
New Message  RN case manager calling to speak w/ rN about the CHF diseasae program. Please call back and discuss.

## 2015-07-31 ENCOUNTER — Emergency Department
Admission: EM | Admit: 2015-07-31 | Discharge: 2015-07-31 | Disposition: A | Discharge status: Home / Self Care | Payer: 59 | Attending: Emergency Medicine | Admitting: Emergency Medicine

## 2015-07-31 DIAGNOSIS — Z79899 Other long term (current) drug therapy: Secondary | ICD-10-CM | POA: Diagnosis not present

## 2015-07-31 DIAGNOSIS — R1310 Dysphagia, unspecified: Secondary | ICD-10-CM | POA: Diagnosis present

## 2015-07-31 DIAGNOSIS — I110 Hypertensive heart disease with heart failure: Secondary | ICD-10-CM | POA: Diagnosis not present

## 2015-07-31 DIAGNOSIS — I5043 Acute on chronic combined systolic (congestive) and diastolic (congestive) heart failure: Secondary | ICD-10-CM | POA: Insufficient documentation

## 2015-07-31 DIAGNOSIS — I252 Old myocardial infarction: Secondary | ICD-10-CM | POA: Diagnosis not present

## 2015-07-31 DIAGNOSIS — K122 Cellulitis and abscess of mouth: Secondary | ICD-10-CM | POA: Insufficient documentation

## 2015-07-31 DIAGNOSIS — J45909 Unspecified asthma, uncomplicated: Secondary | ICD-10-CM | POA: Insufficient documentation

## 2015-07-31 LAB — POCT RAPID STREP A: STREPTOCOCCUS, GROUP A SCREEN (DIRECT): NEGATIVE

## 2015-07-31 MED ORDER — GI COCKTAIL ~~LOC~~
30.0000 mL | Freq: Once | ORAL | Status: AC
  Administered 2015-07-31: 30 mL via ORAL
  Filled 2015-07-31: qty 30

## 2015-07-31 MED ORDER — LIDOCAINE VISCOUS 2 % MT SOLN: 20.0000 mL | OROMUCOSAL | Status: DC | PRN

## 2015-07-31 MED ORDER — FAMOTIDINE 20 MG PO TABS
20.0000 mg | ORAL_TABLET | Freq: Once | ORAL | Status: AC
  Administered 2015-07-31: 20 mg via ORAL
  Filled 2015-07-31: qty 1

## 2015-07-31 MED ORDER — FAMOTIDINE 20 MG PO TABS: 20.0000 mg | ORAL_TABLET | Freq: Two times a day (BID) | ORAL | Status: DC

## 2015-07-31 NOTE — ED Provider Notes (Signed)
[facility] Emergency Department Provider Note        Time seen: ----------------------------------------- 2:44 PM on 07/31/2015 -----------------------------------------    I have reviewed the triage vital signs and the nursing notes.   HISTORY  Chief Complaint Dysphagia    HPI Noah Rodriguez is a 40 y.o. male who presents to ER for difficulty swallowing.Patient states it feels like there is a lump in his throat or that he has scratches throat. Patient states occasionally he feels this way when he sleeps very hard. Reports he snores a lot. Sometimes he feels like there is a ball in his throat that he has to cough up   Past Medical History  Diagnosis Date  . Hypertensive heart disease   . Asthma   . Sleep apnea     a. sleep study 05/2010; b. noncompliant with CPAP  . Obesity, Class III, BMI 40-49.9 (morbid obesity) (HCC)   . Chronic combined systolic and diastolic CHF (congestive heart failure) (HCC)     EF < 25% on Cath (09/17/2014)  . Hypokalemia   . Acute kidney injury (HCC)   . Gout of big toe 09/16/2014  . NICM (nonischemic cardiomyopathy) (HCC)     a. cardiac cath 09/2014 with normal coronary arteries, EF 20-25% with severe global HK; b. echo 08/2014: EF 30-35%, false tendon in LV aepx of no clinical sig, diffuse HK, GR1DD, aortic root 40 mm, trivial MR, trivial pericardial effusion posterior   . Ventral hernia     a. incarcerated omentum 06/10/2015  . H/O medication noncompliance   . MI (myocardial infarction) [facility])     Patient denies having had a MI    Patient Active Problem List   Diagnosis Date Noted  . Incarcerated ventral hernia 06/10/2015  . Ventral hernia   . H/O medication noncompliance   . Generalized abdominal pain   . Hernia of abdominal cavity   . Chronic combined systolic and diastolic heart failure, NYHA class 3 (HCC) 09/27/2014  . Gout 09/27/2014  . Essential hypertension 09/27/2014  . Elevated troponin I level   .  Abnormal cardiac function test 09/15/2014  . Non compliance w medication regimen 09/15/2014  . Chest pain   . Acute on chronic combined systolic and diastolic CHF, NYHA class 3 (HCC) 09/11/2014  . Nonischemic cardiomyopathy (HCC) 09/11/2014  . AKI (acute kidney injury) (HCC) 09/09/2014  . Hypokalemia 09/09/2014  . Elevated troponin 09/09/2014  . Epigastric pain 09/09/2014  . Obesity, Class III, BMI 40-49.9 (morbid obesity) (HCC)   . Sleep apnea   . Hyperglycemia 01/16/2014  . Routine general medical examination at a health care facility 01/16/2014  . Cough 01/16/2014  . Cardiomegaly 01/02/2012  . Mild persistent asthma 12/31/2011  . Hypertensive heart disease 11/26/2009  . CHRONIC GINGIVITIS PLAQUE INDUCED 11/26/2009    Past Surgical History  Procedure Laterality Date  . Cardiac catheterization N/A 09/17/2014    Procedure: Right/Left Heart Cath and Coronary Angiography;  Surgeon: Lennette Bihari, MD;  Location: [facility] INVASIVE CV LAB;  Service: Cardiovascular;  Laterality: N/A;  . Ventral hernia repair N/A 06/11/2015    Procedure: HERNIA REPAIR VENTRAL ADULT;  Surgeon: Lattie Haw, MD;  Location: [facility] ORS;  Service: General;  Laterality: N/A;    Allergies Review of patient's allergies indicates no known allergies.  Social History Social History  Substance Use Topics  . Smoking status: Never Smoker   . Smokeless tobacco: Never Used  . Alcohol Use: No    Review of Systems Constitutional:  Negative for fever. Eyes: Negative for visual changes. ENT: Positive for dysphagia Cardiovascular: Negative for chest pain. Respiratory: Negative for shortness of breath. Skin: Negative for rash. ____________________________________________   PHYSICAL EXAM:  VITAL SIGNS: ED Triage Vitals  Enc Vitals Group     BP 07/31/15 1350 157/105 mmHg     Pulse Rate 07/31/15 1350 70     Resp 07/31/15 1350 20     Temp 07/31/15 1350 98.7 F (37.1 C)     Temp Source 07/31/15 1350 Oral     SpO2  07/31/15 1350 95 %     Weight 07/31/15 1350 318 lb (144.244 kg)     Height 07/31/15 1350 5\' 6"  (1.676 m)     Head Cir --      Peak Flow --      Pain Score --      Pain Loc --      Pain Edu? --      Excl. in GC? --    Constitutional: Alert and oriented. Well appearing and in no distress. Eyes: Conjunctivae are normal. PERRL. Normal extraocular movements. ENT   Head: Normocephalic and atraumatic.   Nose: No congestion/rhinnorhea.   Mouth/Throat: Mucous membranes are moist, uvula is edematous and enlarged, mild to moderate erythema   Neck: No stridor. Musculoskeletal: Nontender with normal range of motion in all extremities. No lower extremity tenderness nor edema. Neurologic:  Normal speech and language. No gross focal neurologic deficits are appreciated.  Skin:  Skin is warm, dry and intact. No rash noted. Psychiatric: Mood and affect are normal. Speech and behavior are normal.  ____________________________________________  ED COURSE:  Pertinent labs & imaging results that were available during my care of the patient were reviewed by me and considered in my medical decision making (see chart for details). Visual presents with uvulitis of uncertain etiology. I don't think this is angioedema, this possibly infectious but more likely posttraumatic. We will check a strep test and give GI cocktail. ____________________________________________    LABS (pertinent positives/negatives)  Labs Reviewed  POCT RAPID STREP A    ____________________________________________  FINAL ASSESSMENT AND PLAN  Uvulitis  Plan: Patient with labs as dictated above. Patient's rapid strep test is negative. I will place him on Pepcid, viscous lidocaine. He is advised to return for worsening symptoms. I believe his uvulitis to be from snoring   Emily Filbert, MD   Note: This dictation was prepared with Dragon dictation. Any transcriptional errors that result from this process are  unintentional   Emily Filbert, MD 07/31/15 1544

## 2015-07-31 NOTE — ED Notes (Signed)
PT arrives to ER via POV and states that he went to sleep, awoke and had a hard time swallowing. Pt states he feels like there is a lump in his throat or that he scratched his throat. Pt alert and oriented X4, active, cooperative, pt in NAD. RR even and unlabored, color WNL.

## 2015-07-31 NOTE — ED Notes (Signed)
Pt c/o pain in throat, able to speak and breath w/o difficulty.  NAD.

## 2015-07-31 NOTE — Discharge Instructions (Signed)
Uvulitis °Uvulitis is infection or inflammation of the uvula. The uvula is the small, finger-like piece of tissue that hangs down at the back of your throat. °CAUSES °This condition may be caused by: °· An infection in the mouth or throat. This is the most common cause. °· Trauma to the uvula. Causes of trauma include burning your mouth and heavy snoring. °· Fluid build-up (edema). Edema can be triggered be an allergic reaction. Uvulitis that is caused by edema is called Quincke disease. °· Inhaling irritants, such as chemical agents, smoke, or steam. °SYMPTOMS °Symptoms of this condition depend on the cause.  °Symptoms of uvulitis that is caused by infection include: °· Red, swollen uvula. °· Sore throat. °· Fever. °· Headache. °· Swollen neck glands. °Symptoms of uvulitis that is caused by trauma, edema, or irritation include: °· Red, swollen uvula. °· Sore throat. °· Trouble swallowing. °· Choking or gagging. °· Trouble breathing. °DIAGNOSIS °This condition is diagnosed with a physical exam. You also may have tests, such as a throat culture and blood tests. °TREATMENT °Treatment for this condition depends on the cause. Treatment may involve: °· Antibiotic medicine. Antibiotics may be prescribed if a bacterial infection is the cause. °· Steroid medicine. Steroids may be given if edema is the cause. °· Surgery to remove part of the uvula (partial uvulectomy). °HOME CARE INSTRUCTIONS °· Rest as much as possible until your condition improves. °· Drink enough fluid to keep your urine clear or pale yellow. °· Take over-the-counter and prescription medicines only as told by your health care provider. °· If you were prescribed an antibiotic medicine, take it as told by your health care provider. Do not stop taking the antibiotic even if you start to feel better. °· Use a cool-mist humidifier to ease irritation in your throat. °· While your throat is sore: °¨ Eat soft foods or drink liquids, such as soup. °¨ Gargle with a  salt-water mixture 3-4 times per day or as needed. To make a salt-water mixture, completely dissolve ½-1 tsp of salt in 1 cup of warm water. °· Keep all follow-up visits as told by your health care provider. This is important. °SEEK MEDICAL CARE IF: °· You have a fever. °· You have trouble eating. °· Your symptoms do not get better. °· Your symptoms come back after treatment. °SEEK IMMEDIATE MEDICAL CARE IF: °· You have trouble breathing. °· You have trouble swallowing. °  °This information is not intended to replace advice given to you by your health care provider. Make sure you discuss any questions you have with your health care provider. °  °Document Released: 08/08/2003 Document Revised: 09/18/2014 Document Reviewed: 03/20/2014 °Elsevier Interactive Patient Education ©2016 [employer] Inc[employer] ° °

## 2016-02-13 ENCOUNTER — Encounter: Payer: Self-pay | Admitting: Emergency Medicine

## 2016-02-13 ENCOUNTER — Inpatient Hospital Stay
Admission: EM | Admit: 2016-02-13 | Discharge: 2016-02-15 | DRG: 291 | Disposition: A | Discharge status: Home / Self Care | Payer: Self-pay | Attending: Internal Medicine | Admitting: Internal Medicine

## 2016-02-13 ENCOUNTER — Emergency Department: Payer: Self-pay

## 2016-02-13 DIAGNOSIS — Z9119 Patient's noncompliance with other medical treatment and regimen: Secondary | ICD-10-CM

## 2016-02-13 DIAGNOSIS — I4581 Long QT syndrome: Secondary | ICD-10-CM | POA: Diagnosis present

## 2016-02-13 DIAGNOSIS — Z9114 Patient's other noncompliance with medication regimen: Secondary | ICD-10-CM

## 2016-02-13 DIAGNOSIS — I252 Old myocardial infarction: Secondary | ICD-10-CM

## 2016-02-13 DIAGNOSIS — Z23 Encounter for immunization: Secondary | ICD-10-CM

## 2016-02-13 DIAGNOSIS — I130 Hypertensive heart and chronic kidney disease with heart failure and stage 1 through stage 4 chronic kidney disease, or unspecified chronic kidney disease: Principal | ICD-10-CM | POA: Diagnosis present

## 2016-02-13 DIAGNOSIS — R0789 Other chest pain: Secondary | ICD-10-CM

## 2016-02-13 DIAGNOSIS — G4730 Sleep apnea, unspecified: Secondary | ICD-10-CM | POA: Diagnosis present

## 2016-02-13 DIAGNOSIS — I5023 Acute on chronic systolic (congestive) heart failure: Secondary | ICD-10-CM | POA: Diagnosis present

## 2016-02-13 DIAGNOSIS — N179 Acute kidney failure, unspecified: Secondary | ICD-10-CM | POA: Diagnosis present

## 2016-02-13 DIAGNOSIS — Z8249 Family history of ischemic heart disease and other diseases of the circulatory system: Secondary | ICD-10-CM

## 2016-02-13 DIAGNOSIS — Z6841 Body Mass Index (BMI) 40.0 and over, adult: Secondary | ICD-10-CM

## 2016-02-13 DIAGNOSIS — I509 Heart failure, unspecified: Secondary | ICD-10-CM

## 2016-02-13 DIAGNOSIS — N189 Chronic kidney disease, unspecified: Secondary | ICD-10-CM | POA: Diagnosis present

## 2016-02-13 LAB — CBC
HEMATOCRIT: 41.8 % (ref 40.0–52.0)
Hemoglobin: 14 g/dL (ref 13.0–18.0)
MCH: 27.6 pg (ref 26.0–34.0)
MCHC: 33.4 g/dL (ref 32.0–36.0)
MCV: 82.6 fL (ref 80.0–100.0)
Platelets: 306 10*3/uL (ref 150–440)
RBC: 5.06 MIL/uL (ref 4.40–5.90)
RDW: 14.9 % — AB (ref 11.5–14.5)
WBC: 6.5 10*3/uL (ref 3.8–10.6)

## 2016-02-13 LAB — BLOOD GAS, ARTERIAL
ACID-BASE EXCESS: 5.5 mmol/L — AB (ref 0.0–2.0)
Bicarbonate: 30.6 mmol/L — ABNORMAL HIGH (ref 20.0–28.0)
FIO2: 0.21
O2 SAT: 79.6 %
PATIENT TEMPERATURE: 37
pCO2 arterial: 45 mmHg (ref 32.0–48.0)
pH, Arterial: 7.44 (ref 7.350–7.450)
pO2, Arterial: 42 mmHg — ABNORMAL LOW (ref 83.0–108.0)

## 2016-02-13 LAB — BASIC METABOLIC PANEL
Anion gap: 6 (ref 5–15)
BUN: 16 mg/dL (ref 6–20)
CHLORIDE: 101 mmol/L (ref 101–111)
CO2: 31 mmol/L (ref 22–32)
Calcium: 8.6 mg/dL — ABNORMAL LOW (ref 8.9–10.3)
Creatinine, Ser: 1.3 mg/dL — ABNORMAL HIGH (ref 0.61–1.24)
GFR calc Af Amer: >60 mL/min (ref >60)
GFR calc non Af Amer: >60 mL/min (ref >60)
Glucose, Bld: 103 mg/dL — ABNORMAL HIGH (ref 65–99)
POTASSIUM: 3.6 mmol/L (ref 3.5–5.1)
Sodium: 138 mmol/L (ref 135–145)

## 2016-02-13 LAB — URINE DRUG SCREEN, QUALITATIVE (ARMC ONLY)
AMPHETAMINES, UR SCREEN: NOT DETECTED
Barbiturates, Ur Screen: NOT DETECTED
Benzodiazepine, Ur Scrn: NOT DETECTED
CANNABINOID 50 NG, UR ~~LOC~~: NOT DETECTED
COCAINE METABOLITE, UR ~~LOC~~: NOT DETECTED
MDMA (ECSTASY) UR SCREEN: NOT DETECTED
Methadone Scn, Ur: NOT DETECTED
Opiate, Ur Screen: NOT DETECTED
PHENCYCLIDINE (PCP) UR S: NOT DETECTED
Tricyclic, Ur Screen: NOT DETECTED

## 2016-02-13 LAB — TROPONIN I
Troponin I: 0.05 ng/mL (ref <0.03)
Troponin I: 0.03 ng/mL (ref <0.03)

## 2016-02-13 LAB — MAGNESIUM: Magnesium: 2.3 mg/dL (ref 1.7–2.4)

## 2016-02-13 LAB — BRAIN NATRIURETIC PEPTIDE: B Natriuretic Peptide: 103 pg/mL — ABNORMAL HIGH (ref 0.0–100.0)

## 2016-02-13 MED ORDER — FUROSEMIDE 10 MG/ML IJ SOLN
60.0000 mg | Freq: Once | INTRAMUSCULAR | Status: AC
  Administered 2016-02-13: 60 mg via INTRAVENOUS
  Filled 2016-02-13: qty 8

## 2016-02-13 MED ORDER — ASPIRIN EC 325 MG PO TBEC
325.0000 mg | DELAYED_RELEASE_TABLET | Freq: Every day | ORAL | Status: DC
  Administered 2016-02-14 – 2016-02-15 (×2): 325 mg via ORAL
  Filled 2016-02-13 (×2): qty 1

## 2016-02-13 MED ORDER — SODIUM CHLORIDE 0.9% FLUSH
3.0000 mL | Freq: Two times a day (BID) | INTRAVENOUS | Status: DC
  Administered 2016-02-13 – 2016-02-14 (×2): 3 mL via INTRAVENOUS

## 2016-02-13 MED ORDER — SODIUM CHLORIDE 0.9% FLUSH: 3.0000 mL | INTRAVENOUS | Status: DC | PRN

## 2016-02-13 MED ORDER — ENOXAPARIN SODIUM 40 MG/0.4ML ~~LOC~~ SOLN
40.0000 mg | Freq: Two times a day (BID) | SUBCUTANEOUS | Status: DC
  Administered 2016-02-13 – 2016-02-14 (×4): 40 mg via SUBCUTANEOUS
  Filled 2016-02-13 (×4): qty 0.4

## 2016-02-13 MED ORDER — NITROGLYCERIN 2 % TD OINT
1.0000 [in_us] | TOPICAL_OINTMENT | Freq: Once | TRANSDERMAL | Status: AC
  Administered 2016-02-13: 1 [in_us] via TOPICAL
  Filled 2016-02-13: qty 1

## 2016-02-13 MED ORDER — FUROSEMIDE 10 MG/ML IJ SOLN
40.0000 mg | Freq: Two times a day (BID) | INTRAMUSCULAR | Status: DC
  Administered 2016-02-13 – 2016-02-14 (×4): 40 mg via INTRAVENOUS
  Filled 2016-02-13 (×3): qty 4

## 2016-02-13 MED ORDER — HYDROCODONE-ACETAMINOPHEN 5-325 MG PO TABS
1.0000 | ORAL_TABLET | ORAL | Status: DC | PRN
  Administered 2016-02-14: 2 via ORAL
  Filled 2016-02-13: qty 2

## 2016-02-13 MED ORDER — HYDRALAZINE HCL 20 MG/ML IJ SOLN: 10.0000 mg | Freq: Four times a day (QID) | INTRAMUSCULAR | Status: DC | PRN

## 2016-02-13 MED ORDER — ALBUTEROL SULFATE HFA 108 (90 BASE) MCG/ACT IN AERS: 2.0000 | INHALATION_SPRAY | Freq: Four times a day (QID) | RESPIRATORY_TRACT | Status: DC | PRN

## 2016-02-13 MED ORDER — LISINOPRIL 5 MG PO TABS
5.0000 mg | ORAL_TABLET | Freq: Every day | ORAL | Status: DC
  Administered 2016-02-13 – 2016-02-15 (×3): 5 mg via ORAL
  Filled 2016-02-13 (×3): qty 1

## 2016-02-13 MED ORDER — ENALAPRILAT 1.25 MG/ML IV SOLN
1.2500 mg | Freq: Once | INTRAVENOUS | Status: AC
  Administered 2016-02-13: 1.25 mg via INTRAVENOUS
  Filled 2016-02-13: qty 2

## 2016-02-13 MED ORDER — SODIUM CHLORIDE 0.9 % IV SOLN: 250.0000 mL | INTRAVENOUS | Status: DC | PRN

## 2016-02-13 MED ORDER — ONDANSETRON HCL 4 MG/2ML IJ SOLN: 4.0000 mg | Freq: Four times a day (QID) | INTRAMUSCULAR | Status: DC | PRN

## 2016-02-13 MED ORDER — SODIUM CHLORIDE 0.9% FLUSH
3.0000 mL | Freq: Two times a day (BID) | INTRAVENOUS | Status: DC
  Administered 2016-02-13 – 2016-02-14 (×4): 3 mL via INTRAVENOUS

## 2016-02-13 MED ORDER — ACETAMINOPHEN 325 MG PO TABS: 650.0000 mg | ORAL_TABLET | Freq: Four times a day (QID) | ORAL | Status: DC | PRN

## 2016-02-13 MED ORDER — CARVEDILOL 25 MG PO TABS
25.0000 mg | ORAL_TABLET | Freq: Two times a day (BID) | ORAL | Status: DC
  Administered 2016-02-13 – 2016-02-15 (×4): 25 mg via ORAL
  Filled 2016-02-13 (×4): qty 1

## 2016-02-13 MED ORDER — ONDANSETRON HCL 4 MG PO TABS: 4.0000 mg | ORAL_TABLET | Freq: Four times a day (QID) | ORAL | Status: DC | PRN

## 2016-02-13 MED ORDER — ALBUTEROL SULFATE (2.5 MG/3ML) 0.083% IN NEBU: 2.5000 mg | INHALATION_SOLUTION | Freq: Four times a day (QID) | RESPIRATORY_TRACT | Status: DC | PRN

## 2016-02-13 MED ORDER — INFLUENZA VAC SPLIT QUAD 0.5 ML IM SUSY
0.5000 mL | PREFILLED_SYRINGE | INTRAMUSCULAR | Status: AC
  Administered 2016-02-14: 0.5 mL via INTRAMUSCULAR
  Filled 2016-02-13: qty 0.5

## 2016-02-13 MED ORDER — POTASSIUM CHLORIDE CRYS ER 20 MEQ PO TBCR
20.0000 meq | EXTENDED_RELEASE_TABLET | Freq: Once | ORAL | Status: AC
  Administered 2016-02-13: 20 meq via ORAL
  Filled 2016-02-13: qty 1

## 2016-02-13 MED ORDER — ASPIRIN 81 MG PO CHEW
324.0000 mg | CHEWABLE_TABLET | Freq: Once | ORAL | Status: AC
  Administered 2016-02-13: 324 mg via ORAL
  Filled 2016-02-13: qty 4

## 2016-02-13 MED ORDER — ACETAMINOPHEN 650 MG RE SUPP: 650.0000 mg | Freq: Four times a day (QID) | RECTAL | Status: DC | PRN

## 2016-02-13 NOTE — Progress Notes (Signed)
Patient c/o cramps, MD notified. potassium PO ordered as well as magnesium check. Will give and continue to monitor.

## 2016-02-13 NOTE — ED Notes (Signed)
Patient states that he has a hx/o high blood pressure and CHF, patient has not been taking his medications. Patient states that he lost his insurance and had not been able to afford his medicine. Patient reports last taking medication in July of 2017.   Patient reports for the past 2 weeks he has had left sided chest pain, patient denies radiation of the pain. Patient also reports having increased shortness of breath and trouble laying flat. Patient has noted increased swelling in his legs.

## 2016-02-13 NOTE — Progress Notes (Signed)
Anticoagulation monitoring(Lovenox):  41yo  male ordered Lovenox 40 mg Q24h  Filed Weights   02/13/16 0809  Weight: (!) 329 lb 4.8 oz (149.4 kg)   BMI 51.7   Lab Results  Component Value Date   CREATININE 1.30 (H) 02/13/2016   CREATININE 1.21 06/11/2015   CREATININE 1.29 (H) 06/10/2015   Estimated Creatinine Clearance: 106.2 mL/min (by C-G formula based on SCr of 1.3 mg/dL (H)). Hemoglobin & Hematocrit     Component Value Date/Time   HGB 14.0 02/13/2016 0841   HCT 41.8 02/13/2016 0841     Per Protocol for Patient with estCrcl > 30 ml/min and BMI > 40, will transition to Lovenox 40 mg BID

## 2016-02-13 NOTE — Care Management (Addendum)
Patient admitted with congestive heart failure.  He is currently employed but does not have insurance.  Says he has not gone to the doctor and has not been taking his blood pressure and heart meds due to lack of insurance.  Discussed  [facility] and provided application. Provided with [employer] 4 dollar list.  Patient keeps dozing off during conversation with snoring respirations.  Awakens quickly.  Says he is just 'so sleepy." Informed 02 sats ok.  Patient with low EF and morbid obesity.

## 2016-02-13 NOTE — ED Notes (Signed)
Patient given urinal to measure output.

## 2016-02-13 NOTE — ED Triage Notes (Signed)
Pt complains of cough, congestion for over a week. Pt states he has shortness of breath and chest pain that is worse with coughing.

## 2016-02-13 NOTE — Progress Notes (Signed)
Initial HF Clinic appointment scheduled on February 25, 2016 at 9:00am. Thank you.  

## 2016-02-13 NOTE — Consult Note (Signed)
[facility] Cardiology  CARDIOLOGY CONSULT NOTE  Patient ID: Noah Rodriguez MRN: [id number] DOB/AGE: 09-15-1975 41 y.o.  Admit date: 02/13/2016 Referring Physician Allena Katz Primary Physician No PCP on file Primary Cardiologist Dr. Nicki Guadalajara Reason for Consultation Chest pain, acute on chronic systolic and diastolic CHF  HPI: 41 year old male referred for chest pain and acute on chronic systolic congestive heart failure. Patient has a host of medical problems that have not been medically treated for about 6 months, including hypertension, systolic and diastolic heart failure, sleep apnea, and morbid obesity. Patient presented to [facility] ER this morning, 02/13/2016, for a several week history of worsening dyspnea on exertion, shortness of breath at rest, lower extremity edema, nonproductive cough, orthopnea, and chest pain. He describes his chest pain as intermittent, brief, nonexertional with occasional associated shortness of breath and diaphoresis. He works on a dock and loads heavy boxes, and denies experiencing chest pain. Admission labs notable for troponin 0.05 and creatinine 1.30. Chest xray was negative for acute cardiopulmonary disease. EKG revealed normal sinus rhythm at a rate of 90 bpm, prolonged Qtc of 501, QRS 116, LVH, LAE, and T wave abnormalities in lateral leads. Patient underwent cardiac catheterization on 08/2014, which revealed elevated right sided pressures, mild pulmonary hypertension, severe LV dysfunction with an EF of 20-25%, and normal coronary arteries. He was being treated medically at that time with carvedilol, lisinopril, spironolactone, and amlodipine, but he lost his insurance in July 2017, and has not taken any of his medications since then. Currently the patient denies chest pain or palpitations, but reports orthopnea, lower extremity edema, nonproductive cough, and exertional dyspnea, but states he feels some better.  Review of systems complete and found to be negative unless  listed above     Past Medical History:  Diagnosis Date  . Acute kidney injury (HCC)   . Asthma   . Chronic combined systolic and diastolic CHF (congestive heart failure) (HCC)    EF < 25% on Cath (09/17/2014)  . Gout of big toe 09/16/2014  . H/O medication noncompliance   . Hypertensive heart disease   . Hypokalemia   . MI (myocardial infarction)    Patient denies having had a MI  . NICM (nonischemic cardiomyopathy) (HCC)    a. cardiac cath 09/2014 with normal coronary arteries, EF 20-25% with severe global HK; b. echo 08/2014: EF 30-35%, false tendon in LV aepx of no clinical sig, diffuse HK, GR1DD, aortic root 40 mm, trivial MR, trivial pericardial effusion posterior   . Obesity, Class III, BMI 40-49.9 (morbid obesity) (HCC)   . Sleep apnea    a. sleep study 05/2010; b. noncompliant with CPAP  . Ventral hernia    a. incarcerated omentum 06/10/2015    Past Surgical History:  Procedure Laterality Date  . CARDIAC CATHETERIZATION N/A 09/17/2014   Procedure: Right/Left Heart Cath and Coronary Angiography;  Surgeon: Lennette Bihari, MD;  Location: [facility] INVASIVE CV LAB;  Service: Cardiovascular;  Laterality: N/A;  . VENTRAL HERNIA REPAIR N/A 06/11/2015   Procedure: HERNIA REPAIR VENTRAL ADULT;  Surgeon: Lattie Haw, MD;  Location: [facility] ORS;  Service: General;  Laterality: N/A;    Prescriptions Prior to Admission  Medication Sig Dispense Refill Last Dose  . albuterol (PROVENTIL HFA;VENTOLIN HFA) 108 (90 BASE) MCG/ACT inhaler Inhale 2 puffs into the lungs every 6 (six) hours as needed for wheezing or shortness of breath. (Patient not taking: Reported on 02/13/2016) 1 Inhaler 11 Not Taking at Unknown time  . famotidine (PEPCID) 20 MG tablet  Take 1 tablet (20 mg total) by mouth 2 (two) times daily. (Patient not taking: Reported on 02/13/2016) 60 tablet 1 Not Taking at Unknown time  . furosemide (LASIX) 40 MG tablet Take 1 tablet (40 mg total) by mouth 2 (two) times daily. (Patient not taking: Reported  on 02/13/2016) 60 tablet 6 Not Taking at Unknown time   Social History   Social History  . Marital status: Married    Spouse name: N/A  . Number of children: N/A  . Years of education: N/A   Occupational History  . Not on file.   Social History Main Topics  . Smoking status: Never Smoker  . Smokeless tobacco: Never Used  . Alcohol use No  . Drug use: No  . Sexual activity: Yes    Birth control/ protection: Condom   Other Topics Concern  . Not on file   Social History Narrative  . No narrative on file    Family History  Problem Relation Age of Onset  . HIV Mother   . Hypertension Mother   . Hypertension Other   . Hypertension Maternal Grandmother   . Hypertension Paternal Grandmother   . Alcohol abuse Neg Hx   . Cancer Neg Hx   . Early death Neg Hx   . Heart disease Neg Hx   . Hyperlipidemia Neg Hx   . Kidney disease Neg Hx   . Stroke Neg Hx   . Heart attack Neg Hx       Review of systems complete and found to be negative unless listed above      PHYSICAL EXAM  General: Well developed, well nourished, morbidly obese, in no acute distress HEENT:  Normocephalic and atramatic Neck:  No JVD.  Lungs: Clear bilaterally to auscultation and percussion. Heart: HRRR . Normal S1 and S2 without gallops or murmurs.  Abdomen: Bowel sounds are positive, abdomen soft and non-tender  Msk:  Patient sitting upright in bed. Extremities: No clubbing, cyanosis. 2+ bilateral lower extremity edema.   Neuro: Alert and oriented X 3. Psych:  Good affect, responds appropriately  Labs:   Lab Results  Component Value Date   WBC 6.5 02/13/2016   HGB 14.0 02/13/2016   HCT 41.8 02/13/2016   MCV 82.6 02/13/2016   PLT 306 02/13/2016    Recent Labs Lab 02/13/16 0841  NA 138  K 3.6  CL 101  CO2 31  BUN 16  CREATININE 1.30*  CALCIUM 8.6*  GLUCOSE 103*   Lab Results  Component Value Date   TROPONINI 0.05 (HH) 02/13/2016    Lab Results  Component Value Date   CHOL 141  09/10/2014   CHOL 141 01/16/2014   CHOL 120 12/31/2011   Lab Results  Component Value Date   HDL 31 (L) 09/10/2014   HDL 33.40 (L) 01/16/2014   HDL 30.30 (L) 12/31/2011   Lab Results  Component Value Date   LDLCALC 95 09/10/2014   LDLCALC 93 01/16/2014   LDLCALC 76 12/31/2011   Lab Results  Component Value Date   TRIG 77 09/10/2014   TRIG 73.0 01/16/2014   TRIG 71.0 12/31/2011   Lab Results  Component Value Date   CHOLHDL 4.5 09/10/2014   CHOLHDL 4 01/16/2014   CHOLHDL 4 12/31/2011   No results found for: LDLDIRECT    Radiology: Dg Chest 2 View  Result Date: 02/13/2016 CLINICAL DATA:  Chest pain, shortness of breath. EXAM: CHEST  2 VIEW COMPARISON:  Radiographs of Jun 10, 2015. FINDINGS: The heart  size and mediastinal contours are within normal limits. Both lungs are clear. No pneumothorax or pleural effusion is noted. The visualized skeletal structures are unremarkable. IMPRESSION: No active cardiopulmonary disease. Electronically Signed   By: Lupita Raider, M.D.   On: 02/13/2016 08:58    EKG: Sinus rhythm, rate 87 bpm  ASSESSMENT AND PLAN:  1. Acute on chronic systolic heart failure with an EF of 20-25% per most recent echocardiogram, most likely due to dietary and medication noncompliance.  2. Chest pain with typical and atypical features with borderline elevated troponin and normal coronary arteries per cardiac cath 08/2014, and currently chest pain-free. 3. Borderline elevated troponin, likely due to accelerated hypertension 4. Prolonged Qtc, not on any QT-prolonging medications, asymptomatic, will continue to monitor.   Recommendations: 1. Agree with current therapy 2. Continue to cycle troponin 3. Continue IV Lasix 40 mg BID with careful monitoring of renal function   4. Review echocardiogram 5. Further recommendations pending results of echocardiogram and patient's initial course.  Signed: Leanora Ivanoff, PA-C 02/13/2016, 1:18 PM

## 2016-02-13 NOTE — H&P (Signed)
[employer] - [place] [facility]   PATIENT NAME: Noah Rodriguez    MR#:  [id number]  DATE OF BIRTH:  Jun 15, 1975  DATE OF ADMISSION:  02/13/2016  PRIMARY CARE PHYSICIAN: No PCP Per Patient   REQUESTING/REFERRING PHYSICIAN: Ileana Roup MD  CHIEF COMPLAINT:   Chief Complaint  Patient presents with  . Shortness of Breath  . Chest Pain    HISTORY OF PRESENT ILLNESS: Noah Rodriguez  is a 41 y.o. male with a known history of Nonischemic cardiomyopathy, asthma, essential hypertension, gout, morbid obesity, sleep apnea noncompliant with CPAP) presenting to the emergency room with complaint of shortness of breath and chest pain. Patient reports that he lost his insurance to work and has not been able to get any medication since last year. Patient reports that he has had progressive shortness of breath over the past few weeks. He has noticed swelling of the lower extremity. Unable to lay flat. He is also noticed swelling of the lower extremity. Patient also has left-sided chest pressure. He is noted to have elevated blood pressure in the emergency room. PAST MEDICAL HISTORY:   Past Medical History:  Diagnosis Date  . Acute kidney injury (HCC)   . Asthma   . Chronic combined systolic and diastolic CHF (congestive heart failure) (HCC)    EF < 25% on Cath (09/17/2014)  . Gout of big toe 09/16/2014  . H/O medication noncompliance   . Hypertensive heart disease   . Hypokalemia   . MI (myocardial infarction)    Patient denies having had a MI  . NICM (nonischemic cardiomyopathy) (HCC)    a. cardiac cath 09/2014 with normal coronary arteries, EF 20-25% with severe global HK; b. echo 08/2014: EF 30-35%, false tendon in LV aepx of no clinical sig, diffuse HK, GR1DD, aortic root 40 mm, trivial MR, trivial pericardial effusion posterior   . Obesity, Class III, BMI 40-49.9 (morbid obesity) (HCC)   . Sleep apnea    a. sleep study 05/2010; b. noncompliant with CPAP  . Ventral  hernia    a. incarcerated omentum 06/10/2015    PAST SURGICAL HISTORY: Past Surgical History:  Procedure Laterality Date  . CARDIAC CATHETERIZATION N/A 09/17/2014   Procedure: Right/Left Heart Cath and Coronary Angiography;  Surgeon: Lennette Bihari, MD;  Location: [facility] INVASIVE CV LAB;  Service: Cardiovascular;  Laterality: N/A;  . VENTRAL HERNIA REPAIR N/A 06/11/2015   Procedure: HERNIA REPAIR VENTRAL ADULT;  Surgeon: Lattie Haw, MD;  Location: [facility] ORS;  Service: General;  Laterality: N/A;    SOCIAL HISTORY:  Social History  Substance Use Topics  . Smoking status: Never Smoker  . Smokeless tobacco: Never Used  . Alcohol use No    FAMILY HISTORY:  Family History  Problem Relation Age of Onset  . HIV Mother   . Hypertension Mother   . Hypertension Other   . Hypertension Maternal Grandmother   . Hypertension Paternal Grandmother   . Alcohol abuse Neg Hx   . Cancer Neg Hx   . Early death Neg Hx   . Heart disease Neg Hx   . Hyperlipidemia Neg Hx   . Kidney disease Neg Hx   . Stroke Neg Hx   . Heart attack Neg Hx     DRUG ALLERGIES: No Known Allergies  REVIEW OF SYSTEMS:   CONSTITUTIONAL: No fever, fatigue or weakness.  EYES: No blurred or double vision.  EARS, NOSE, AND THROAT: No tinnitus or ear pain.  RESPIRATORY: No cough, shortness of  breath, wheezing or hemoptysis.  CARDIOVASCULAR: Positive chest pain, positive orthopnea, positive edema.  GASTROINTESTINAL: No nausea, vomiting, diarrhea or abdominal pain.  GENITOURINARY: No dysuria, hematuria.  ENDOCRINE: No polyuria, nocturia,  HEMATOLOGY: No anemia, easy bruising or bleeding SKIN: No rash or lesion. MUSCULOSKELETAL: No joint pain or arthritis.   NEUROLOGIC: No tingling, numbness, weakness.  PSYCHIATRY: No anxiety or depression.   MEDICATIONS AT HOME:  Prior to Admission medications   Medication Sig Start Date End Date Taking? Authorizing Provider  albuterol (PROVENTIL HFA;VENTOLIN HFA) 108 (90 BASE)  MCG/ACT inhaler Inhale 2 puffs into the lungs every 6 (six) hours as needed for wheezing or shortness of breath. Patient not taking: Reported on 02/13/2016 01/16/14   Etta Grandchild, MD  famotidine (PEPCID) 20 MG tablet Take 1 tablet (20 mg total) by mouth 2 (two) times daily. Patient not taking: Reported on 02/13/2016 07/31/15   Emily Filbert, MD  furosemide (LASIX) 40 MG tablet Take 1 tablet (40 mg total) by mouth 2 (two) times daily. Patient not taking: Reported on 02/13/2016 09/20/14   Ellsworth Lennox, PA      PHYSICAL EXAMINATION:   VITAL SIGNS: Blood pressure (!) 145/90, pulse 66, temperature 97.7 F (36.5 C), temperature source Oral, resp. rate 18, height 5\' 7"  (1.702 m), weight (!) 329 lb 4.8 oz (149.4 kg), SpO2 98 %.  GENERAL:  41 y.o.-year-old patient lying in the bed with no acute distress.  EYES: Pupils equal, round, reactive to light and accommodation. No scleral icterus. Extraocular muscles intact.  HEENT: Head atraumatic, normocephalic. Oropharynx and nasopharynx clear.  NECK:  Supple, no jugular venous distention. No thyroid enlargement, no tenderness.  LUNGS: Bilateral crackles at the bases no accesory muscle usage CARDIOVASCULAR: S1, S2 normal. No murmurs, rubs, or gallops.  ABDOMEN: Soft, nontender, nondistended. Bowel sounds present. No organomegaly or mass.  EXTREMITIES: 2+ pedal edema, cyanosis, or clubbing.  NEUROLOGIC: Cranial nerves II through XII are intact. Muscle strength 5/5 in all extremities. Sensation intact. Gait not checked.  PSYCHIATRIC: The patient is alert and oriented x 3.  SKIN: No obvious rash, lesion, or ulcer.   LABORATORY PANEL:   CBC  Recent Labs Lab 02/13/16 0841  WBC 6.5  HGB 14.0  HCT 41.8  PLT 306  MCV 82.6  MCH 27.6  MCHC 33.4  RDW 14.9*   ------------------------------------------------------------------------------------------------------------------  Chemistries   Recent Labs Lab 02/13/16 0841  NA 138  K 3.6  CL 101   CO2 31  GLUCOSE 103*  BUN 16  CREATININE 1.30*  CALCIUM 8.6*   ------------------------------------------------------------------------------------------------------------------ estimated creatinine clearance is 106.2 mL/min (by C-G formula based on SCr of 1.3 mg/dL (H)). ------------------------------------------------------------------------------------------------------------------ No results for input(s): TSH, T4TOTAL, T3FREE, THYROIDAB in the last 72 hours.  Invalid input(s): FREET3   Coagulation profile No results for input(s): INR, PROTIME in the last 168 hours. ------------------------------------------------------------------------------------------------------------------- No results for input(s): DDIMER in the last 72 hours. -------------------------------------------------------------------------------------------------------------------  Cardiac Enzymes  Recent Labs Lab 02/13/16 0841  TROPONINI 0.05*   ------------------------------------------------------------------------------------------------------------------ Invalid input(s): POCBNP  ---------------------------------------------------------------------------------------------------------------  Urinalysis    Component Value Date/Time   COLORURINE YELLOW (A) 06/10/2015 0540   APPEARANCEUR CLEAR (A) 06/10/2015 0540   LABSPEC 1.016 06/10/2015 0540   PHURINE 7.0 06/10/2015 0540   GLUCOSEU NEGATIVE 06/10/2015 0540   GLUCOSEU NEGATIVE 01/16/2014 1118   HGBUR 1+ (A) 06/10/2015 0540   BILIRUBINUR NEGATIVE 06/10/2015 0540   KETONESUR NEGATIVE 06/10/2015 0540   PROTEINUR NEGATIVE 06/10/2015 0540   UROBILINOGEN 0.2 01/16/2014 1118  NITRITE NEGATIVE 06/10/2015 0540   LEUKOCYTESUR NEGATIVE 06/10/2015 0540     RADIOLOGY: Dg Chest 2 View  Result Date: 02/13/2016 CLINICAL DATA:  Chest pain, shortness of breath. EXAM: CHEST  2 VIEW COMPARISON:  Radiographs of Jun 10, 2015. FINDINGS: The heart size and  mediastinal contours are within normal limits. Both lungs are clear. No pneumothorax or pleural effusion is noted. The visualized skeletal structures are unremarkable. IMPRESSION: No active cardiopulmonary disease. Electronically Signed   By: Lupita Raider, M.D.   On: 02/13/2016 08:58    EKG: Orders placed or performed during the hospital encounter of 02/13/16  . EKG 12-Lead  . EKG 12-Lead    IMPRESSION AND PLAN: Patient is a 41 year old noncompliant with his medications presents with shortness of breath chest pain  1. Shortness of breath suspect due to acute systolic CHF even though chest x-ray that did not show any evidence of CHF he appears volume overloaded We will treat with IV Lasix, repeat his echocardiogram Start patient on Coreg and lisinopril Cardiology consult  2. Chest pain with negative cardiac catheter in 2016 likely related to accelerated hypertension we will cycle his cardiac enzymes Place him on aspirin  3. Accelerated hypertension We'll treat with Coreg and lisinopril. He will also be on Lasix will adjust his regimen as needed will place him on IV hydralazine when necessary  4. Morbid obesity weight loss recommended  5. Sleep apnea noncompliant with CPAP machine  6. Chronic kidney disease avoid nephrotoxins monitor renal function with Lasix and ACE inhibitor  7. misc Lovenox for DVT prophylaxis  All the records are reviewed and case discussed with ED provider. Management plans discussed with the patient, family and they are in agreement.  CODE STATUS: Code Status History    Date Active Date Inactive Code Status Order ID Comments User Context   06/10/2015 10:38 AM 06/12/2015  4:22 PM Full Code [id number]  Lattie Haw, MD ED   09/17/2014 12:37 PM 09/20/2014  6:09 PM Full Code [id number]  Lennette Bihari, MD Inpatient   09/09/2014  4:53 PM 09/17/2014 12:37 PM Full Code [id number]  Manson Passey, PA Inpatient       TOTAL TIME TAKING CARE OF THIS PATIENT:55  minutes.    Auburn Bilberry M.D on 02/13/2016 at 10:57 AM  Between 7am to 6pm - Pager - [phone]  After 6pm go to [email or web address] - password [email or web address] [facility]  [facility]  Office  [phone]  CC: Primary care physician; No PCP Per Patient

## 2016-02-13 NOTE — ED Provider Notes (Addendum)
[facility] Emergency Department Provider Note  ____________________________________________   I have reviewed the triage vital signs and the nursing notes.   HISTORY  Chief Complaint Shortness of Breath and Chest Pain    HPI Noah Rodriguez is a 41 y.o. male and a history of morbid obesity, untreated hypertension, CHF for which he does not see a doctor, he has no idea when the last time he had his blood pressure checked was. He does not know what medications he is supposed to be on for his hypertension, he states over the last 3 weeks she's had increasing dyspnea, and a nonproductive cough. He states that he has orthopnea, and bilateral lower extremity swelling. He also has chest pain in the left. The pain is reproducible, hurts when he coughs. His been there for a few days. Denies fever or chills. Denies URI symptoms otherwise. Patient has a documented EF at 2025% on echo in any 16 with a cardiac catheter on 09/2014 showing normal arteries according to notes.     Past Medical History:  Diagnosis Date  . Acute kidney injury (HCC)   . Asthma   . Chronic combined systolic and diastolic CHF (congestive heart failure) (HCC)    EF < 25% on Cath (09/17/2014)  . Gout of big toe 09/16/2014  . H/O medication noncompliance   . Hypertensive heart disease   . Hypokalemia   . MI (myocardial infarction)    Patient denies having had a MI  . NICM (nonischemic cardiomyopathy) (HCC)    a. cardiac cath 09/2014 with normal coronary arteries, EF 20-25% with severe global HK; b. echo 08/2014: EF 30-35%, false tendon in LV aepx of no clinical sig, diffuse HK, GR1DD, aortic root 40 mm, trivial MR, trivial pericardial effusion posterior   . Obesity, Class III, BMI 40-49.9 (morbid obesity) (HCC)   . Sleep apnea    a. sleep study 05/2010; b. noncompliant with CPAP  . Ventral hernia    a. incarcerated omentum 06/10/2015    Patient Active Problem List   Diagnosis Date Noted  .  Incarcerated ventral hernia 06/10/2015  . Ventral hernia   . H/O medication noncompliance   . Generalized abdominal pain   . Hernia of abdominal cavity   . Chronic combined systolic and diastolic heart failure, NYHA class 3 (HCC) 09/27/2014  . Gout 09/27/2014  . Essential hypertension 09/27/2014  . Elevated troponin I level   . Abnormal cardiac function test 09/15/2014  . Non compliance w medication regimen 09/15/2014  . Chest pain   . Acute on chronic combined systolic and diastolic CHF, NYHA class 3 (HCC) 09/11/2014  . Nonischemic cardiomyopathy (HCC) 09/11/2014  . AKI (acute kidney injury) (HCC) 09/09/2014  . Hypokalemia 09/09/2014  . Elevated troponin 09/09/2014  . Epigastric pain 09/09/2014  . Obesity, Class III, BMI 40-49.9 (morbid obesity) (HCC)   . Sleep apnea   . Hyperglycemia 01/16/2014  . Routine general medical examination at a health care facility 01/16/2014  . Cough 01/16/2014  . Cardiomegaly 01/02/2012  . Mild persistent asthma 12/31/2011  . Hypertensive heart disease 11/26/2009  . CHRONIC GINGIVITIS PLAQUE INDUCED 11/26/2009    Past Surgical History:  Procedure Laterality Date  . CARDIAC CATHETERIZATION N/A 09/17/2014   Procedure: Right/Left Heart Cath and Coronary Angiography;  Surgeon: Lennette Bihari, MD;  Location: [facility] INVASIVE CV LAB;  Service: Cardiovascular;  Laterality: N/A;  . VENTRAL HERNIA REPAIR N/A 06/11/2015   Procedure: HERNIA REPAIR VENTRAL ADULT;  Surgeon: Lattie Haw, MD;  Location: [facility] ORS;  Service: General;  Laterality: N/A;    Prior to Admission medications   Medication Sig Start Date End Date Taking? Authorizing Provider  albuterol (PROVENTIL HFA;VENTOLIN HFA) 108 (90 BASE) MCG/ACT inhaler Inhale 2 puffs into the lungs every 6 (six) hours as needed for wheezing or shortness of breath. 01/16/14   Etta Grandchild, MD  amLODipine (NORVASC) 2.5 MG tablet Take 2.5 mg by mouth daily.    Historical Provider, MD  carvedilol (COREG) 12.5 MG tablet  Take 12.5 mg by mouth 2 (two) times daily with a meal.    Historical Provider, MD  famotidine (PEPCID) 20 MG tablet Take 1 tablet (20 mg total) by mouth 2 (two) times daily. 07/31/15   Emily Filbert, MD  furosemide (LASIX) 40 MG tablet Take 1 tablet (40 mg total) by mouth 2 (two) times daily. 09/20/14   Ellsworth Lennox, PA  lidocaine (XYLOCAINE) 2 % solution Use as directed 20 mLs in the mouth or throat as needed for mouth pain. 07/31/15   Emily Filbert, MD  lisinopril (PRINIVIL,ZESTRIL) 10 MG tablet Take 10 mg by mouth 2 (two) times daily.    Historical Provider, MD  oxyCODONE-acetaminophen (PERCOCET/ROXICET) 5-325 MG tablet Take 2 tablets by mouth every 4 (four) hours as needed for moderate pain. 06/12/15   Lattie Haw, MD  spironolactone (ALDACTONE) 25 MG tablet Take 12.5 mg by mouth daily.    Historical Provider, MD    Allergies Patient has no known allergies.  Family History  Problem Relation Age of Onset  . HIV Mother   . Hypertension Mother   . Hypertension Other   . Hypertension Maternal Grandmother   . Hypertension Paternal Grandmother   . Alcohol abuse Neg Hx   . Cancer Neg Hx   . Early death Neg Hx   . Heart disease Neg Hx   . Hyperlipidemia Neg Hx   . Kidney disease Neg Hx   . Stroke Neg Hx   . Heart attack Neg Hx     Social History Social History  Substance Use Topics  . Smoking status: Never Smoker  . Smokeless tobacco: Never Used  . Alcohol use No    Review of Systems Constitutional: No fever/chills Eyes: No visual changes. ENT: No sore throat. No stiff neck no neck pain Cardiovascular: Positive chest pain. Respiratory: Positive Gastrointestinal:   no vomiting.  No diarrhea.  No constipation. Genitourinary: Negative for dysuria. Musculoskeletal: Negative lower extremity swelling Skin: Negative for rash. Neurological: Negative for severe headaches, focal weakness or numbness. 10-point ROS otherwise  negative.  ____________________________________________   PHYSICAL EXAM:  VITAL SIGNS: ED Triage Vitals  Enc Vitals Group     BP 02/13/16 0807 (!) 186/118     Pulse Rate 02/13/16 0807 88     Resp 02/13/16 0807 (!) 22     Temp 02/13/16 0807 97.7 F (36.5 C)     Temp Source 02/13/16 0807 Oral     SpO2 02/13/16 0807 98 %     Weight 02/13/16 0809 (!) 329 lb 4.8 oz (149.4 kg)     Height 02/13/16 0805 5\' 7"  (1.702 m)     Head Circumference --      Peak Flow --      Pain Score 02/13/16 0805 5     Pain Loc --      Pain Edu? --      Excl. in GC? --     Constitutional: Alert and oriented. Well appearing and in  no acute distress. Eyes: Conjunctivae are normal. PERRL. EOMI. Head: Atraumatic. Nose: No congestion/rhinnorhea. Mouth/Throat: Mucous membranes are moist.  Oropharynx non-erythematous. Neck: No stridor.   Nontender with no meningismus Cardiovascular: Normal rate, regular rhythm. Grossly normal heart sounds.  Good peripheral circulation. Chest: Tender to palpation left chest wall, this does reproduce his pain no flail chest no crepitus, no rib fracture palpated. Respiratory: Normal respiratory effort.  No retractions. Lungs CTAB. Abdominal: Soft and nontender. No distention. No guarding no rebound Back:  There is no focal tenderness or step off.  there is no midline tenderness there are no lesions noted. there is no CVA tenderness Musculoskeletal: No lower extremity tenderness, no upper extremity tenderness. No joint effusions, no DVT signs strong distal pulses 1-2+ bilateral symmetric pitting edema Neurologic:  Normal speech and language. No gross focal neurologic deficits are appreciated.  Skin:  Skin is warm, dry and intact. No rash noted. Psychiatric: Mood and affect are normal. Speech and behavior are normal.  ____________________________________________   LABS (all labs ordered are listed, but only abnormal results are displayed)  Labs Reviewed  BASIC METABOLIC PANEL   CBC  TROPONIN I  BRAIN NATRIURETIC PEPTIDE   ____________________________________________  EKG  I personally interpreted any EKGs ordered by me or triage Sinus rhythm at 90 bpm no acute ST elevation or depression, LVH noted, LAD noted, QT prolongation noted. ____________________________________________  RADIOLOGY  I reviewed any imaging ordered by me or triage that were performed during my shift and, if possible, patient and/or family made aware of any abnormal findings. ____________________________________________   PROCEDURES  Procedure(s) performed: None  Procedures  Critical Care performed: CRITICAL CARE Performed by: Jeanmarie Plant   Total critical care time: 40 minutes  Critical care time was exclusive of separately billable procedures and treating other patients.  Critical care was necessary to treat or prevent imminent or life-threatening deterioration.  Critical care was time spent personally by me on the following activities: development of treatment plan with patient and/or surrogate as well as nursing, discussions with consultants, evaluation of patient's response to treatment, examination of patient, obtaining history from patient or surrogate, ordering and performing treatments and interventions, ordering and review of laboratory studies, ordering and review of radiographic studies, pulse oximetry and re-evaluation of patient's condition.   ____________________________________________   INITIAL IMPRESSION / ASSESSMENT AND PLAN / ED COURSE  Pertinent labs & imaging results that were available during my care of the patient were reviewed by me and considered in my medical decision making (see chart for details).  Patient with refusal chest wall pain in the context of what is most likely CHF flare, patient does not take any medications apparently. His EF is very low. He has all the stigmata of CHF. We'll obtain chest x-ray, we'll give him Lasix, we will give  him something to bring down his blood pressure although I suspect this is where he normally lives so we will not be too aggressive, we will evaluate for possibility of pneumonia which I have low suspicion, do not suspect PE for reproduce will chest wall pain in the context of a cough. I do suspect the cough is most likely cardiogenic secondary to fluid however. We will continue to evaluate this unhealthy gentleman who unfortunately is not compliant with any of the medications that might prolong his life at this time.  ----------------------------------------- 1:19 PM on 02/13/2016 -----------------------------------------  Late entry: Patient did diurese very well with Lasix, blood pressure came down very well with enalapril and  nitroglycerin. He did take off the nitroglycerin when his pressure gotten to the 140s to 150s as this is low for him. He has been admitted to the hospitalist service. Given chest pain and elevated blood pressure, hypertensive urgency is not an unreasonable diagnosis for him.    ____________________________________________   FINAL CLINICAL IMPRESSION(S) / ED DIAGNOSES  Final diagnoses:  None      This chart was dictated using voice recognition software.  Despite best efforts to proofread,  errors can occur which can change meaning.      Jeanmarie Plant, MD 02/13/16 1610    Jeanmarie Plant, MD 02/13/16 [phone]

## 2016-02-13 NOTE — ED Notes (Signed)
RN in room to check on patient, patient reports feeling lightheaded and appears drowsy. Dr. Alphonzo Lemmings notified, verbal order given to remove nitro paste from patients chest.  Will continue to monitor patient.

## 2016-02-13 NOTE — Progress Notes (Signed)
Patients ABG checked d/t patient dozing off frequently in mid conversation.  PO2 42. MD notified. Patient placed on 02. Will continue to monitor.

## 2016-02-14 ENCOUNTER — Inpatient Hospital Stay
Admit: 2016-02-14 | Discharge: 2016-02-14 | Disposition: A | Discharge status: Home / Self Care | Payer: Self-pay | Attending: Internal Medicine | Admitting: Internal Medicine

## 2016-02-14 LAB — BASIC METABOLIC PANEL
Anion gap: 9 (ref 5–15)
BUN: 23 mg/dL — AB (ref 6–20)
CHLORIDE: 100 mmol/L — AB (ref 101–111)
CO2: 29 mmol/L (ref 22–32)
Calcium: 8.7 mg/dL — ABNORMAL LOW (ref 8.9–10.3)
Creatinine, Ser: 1.65 mg/dL — ABNORMAL HIGH (ref 0.61–1.24)
GFR calc Af Amer: 59 mL/min — ABNORMAL LOW (ref >60)
GFR calc non Af Amer: 50 mL/min — ABNORMAL LOW (ref >60)
GLUCOSE: 93 mg/dL (ref 65–99)
POTASSIUM: 3.6 mmol/L (ref 3.5–5.1)
Sodium: 138 mmol/L (ref 135–145)

## 2016-02-14 LAB — CBC
HCT: 42.7 % (ref 40.0–52.0)
Hemoglobin: 13.7 g/dL (ref 13.0–18.0)
MCH: 26.7 pg (ref 26.0–34.0)
MCHC: 32.1 g/dL (ref 32.0–36.0)
MCV: 83.1 fL (ref 80.0–100.0)
PLATELETS: 297 10*3/uL (ref 150–440)
RBC: 5.14 MIL/uL (ref 4.40–5.90)
RDW: 14.7 % — ABNORMAL HIGH (ref 11.5–14.5)
WBC: 6.5 10*3/uL (ref 3.8–10.6)

## 2016-02-14 LAB — TROPONIN I: Troponin I: 0.04 ng/mL (ref <0.03)

## 2016-02-14 MED ORDER — FUROSEMIDE 10 MG/ML IJ SOLN
20.0000 mg | Freq: Two times a day (BID) | INTRAMUSCULAR | Status: DC
  Administered 2016-02-14: 20 mg via INTRAVENOUS
  Filled 2016-02-14: qty 2

## 2016-02-14 NOTE — Clinical Social Work Note (Signed)
CSW received consult for patient needing assistance with insurance. According to the note by [facility] Ermalene Searing on 02/13/2016, she has giving the information for the [facility] as well as other resources. CSW signing off.  Argentina Ponder, MSW, Theresia Majors [phone]

## 2016-02-14 NOTE — Progress Notes (Signed)
[facility] Cardiology  SUBJECTIVE: The patient reports his shortness of breath is improved today but not quite back to baseline. He has been up ambulating. He denies current chest pain.   Vitals:   02/13/16 1035 02/13/16 1135 02/13/16 1931 02/14/16 0557  BP: (!) 145/90 (!) 165/107 121/90 140/88  Pulse:  76 85 70  Resp:  18 18   Temp:  97.5 F (36.4 C) 97.8 F (36.6 C) 97.9 F (36.6 C)  TempSrc:  Oral Oral Oral  SpO2:  93% 98% 100%  Weight:      Height:         Intake/Output Summary (Last 24 hours) at 02/14/16 1115 Last data filed at 02/14/16 1053  Gross per 24 hour  Intake              240 ml  Output             1860 ml  Net            -1620 ml      PHYSICAL EXAM  General: Well developed, well nourished, in no acute distress HEENT:  Normocephalic and atramatic Neck:  No JVD.  Lungs: Distant breath sounds. Clear bilaterally to auscultation and percussion. Heart: HRRR . Normal S1 and S2 without gallops or murmurs.  Abdomen: Bowel sounds are positive, abdomen soft and non-tender  Msk:  Back normal, normal gait. Normal strength and tone for age. Extremities: No clubbing, cyanosis, 1+ BLE edema.   Neuro: Alert and oriented X 3. Psych:  Good affect, responds appropriately   LABS: Basic Metabolic Panel:  Recent Labs  16/10/96 0841 02/13/16 1740 02/14/16 0520  NA 138  --  138  K 3.6  --  3.6  CL 101  --  100*  CO2 31  --  29  GLUCOSE 103*  --  93  BUN 16  --  23*  CREATININE 1.30*  --  1.65*  CALCIUM 8.6*  --  8.7*  MG  --  2.3  --    Liver Function Tests: No results for input(s): AST, ALT, ALKPHOS, BILITOT, PROT, ALBUMIN in the last 72 hours. No results for input(s): LIPASE, AMYLASE in the last 72 hours. CBC:  Recent Labs  02/13/16 0841 02/14/16 0520  WBC 6.5 6.5  HGB 14.0 13.7  HCT 41.8 42.7  MCV 82.6 83.1  PLT 306 297   Cardiac Enzymes:  Recent Labs  02/13/16 0841 02/13/16 1740 02/13/16 2341  TROPONINI 0.05* 0.03* 0.04*   BNP: Invalid  input(s): POCBNP D-Dimer: No results for input(s): DDIMER in the last 72 hours. Hemoglobin A1C: No results for input(s): HGBA1C in the last 72 hours. Fasting Lipid Panel: No results for input(s): CHOL, HDL, LDLCALC, TRIG, CHOLHDL, LDLDIRECT in the last 72 hours. Thyroid Function Tests: No results for input(s): TSH, T4TOTAL, T3FREE, THYROIDAB in the last 72 hours.  Invalid input(s): FREET3 Anemia Panel: No results for input(s): VITAMINB12, FOLATE, FERRITIN, TIBC, IRON, RETICCTPCT in the last 72 hours.  Dg Chest 2 View  Result Date: 02/13/2016 CLINICAL DATA:  Chest pain, shortness of breath. EXAM: CHEST  2 VIEW COMPARISON:  Radiographs of Jun 10, 2015. FINDINGS: The heart size and mediastinal contours are within normal limits. Both lungs are clear. No pneumothorax or pleural effusion is noted. The visualized skeletal structures are unremarkable. IMPRESSION: No active cardiopulmonary disease. Electronically Signed   By: Lupita Raider, M.D.   On: 02/13/2016 08:58     Echo pending  TELEMETRY: NSR  ASSESSMENT AND PLAN:  Active Problems:   Acute CHF (congestive heart failure) (HCC)   1. Acute on chronic systolic heart failure with an EF of 20-25% per most recent echocardiogram, most likely due to dietary and medication noncompliance.  2. Chest pain with typical and atypical features with borderline elevated troponin and normal coronary arteries per cardiac cath 08/2014, and currently chest pain-free. 3. Borderline elevated troponin, likely due to accelerated hypertension 4. Prolonged Qtc, not on any QT-prolonging medications, asymptomatic, will continue to monitor.   Recommendations: 1. Continue diuresis 2. Monitor renal function closely  3. Review 2D echocardiogram 4. Further recommendations pending results of echocardiogram and patient's clinical course.   The patient was seen under the supervision of Dr. Darrold Junker.   Paige Horcher, PA-S 02/14/2016 11:15 AM

## 2016-02-14 NOTE — Progress Notes (Signed)
[facility] [place] at [facility]   PATIENT NAME: Noah Rodriguez    MR#:  [id number]  DATE OF BIRTH:  24-Jan-1975  SUBJECTIVE:  CHIEF COMPLAINT:   Chief Complaint  Patient presents with  . Shortness of Breath  . Chest Pain    Non compliant to meds due to loss of insurance.  came with CHF exacerbation and some chest pains.    Have good diuresis, no complains now.  REVIEW OF SYSTEMS:  CONSTITUTIONAL: No fever, fatigue or weakness.  EYES: No blurred or double vision.  EARS, NOSE, AND THROAT: No tinnitus or ear pain.  RESPIRATORY: No cough, shortness of breath, wheezing or hemoptysis.  CARDIOVASCULAR: No chest pain, orthopnea, edema.  GASTROINTESTINAL: No nausea, vomiting, diarrhea or abdominal pain.  GENITOURINARY: No dysuria, hematuria.  ENDOCRINE: No polyuria, nocturia,  HEMATOLOGY: No anemia, easy bruising or bleeding SKIN: No rash or lesion. MUSCULOSKELETAL: No joint pain or arthritis.   NEUROLOGIC: No tingling, numbness, weakness.  PSYCHIATRY: No anxiety or depression.   ROS  DRUG ALLERGIES:  No Known Allergies  VITALS:  Blood pressure 122/71, pulse 79, temperature 97.3 F (36.3 C), temperature source Oral, resp. rate 16, height 5\' 7"  (1.702 m), weight (!) 145.7 kg (321 lb 4.8 oz), SpO2 95 %.  PHYSICAL EXAMINATION:  GENERAL:  41 y.o.-year-old obese patient lying in the bed with no acute distress.  EYES: Pupils equal, round, reactive to light and accommodation. No scleral icterus. Extraocular muscles intact.  HEENT: Head atraumatic, normocephalic. Oropharynx and nasopharynx clear.  NECK:  Supple, no jugular venous distention. No thyroid enlargement, no tenderness.  LUNGS: Normal breath sounds bilaterally, no wheezing, bilateral crepitation. No use of accessory muscles of respiration.  CARDIOVASCULAR: S1, S2 normal. No murmurs, rubs, or gallops.  ABDOMEN: Soft, nontender, nondistended. Bowel sounds present. No organomegaly or mass.  EXTREMITIES:  No pedal edema, cyanosis, or clubbing.  NEUROLOGIC: Cranial nerves II through XII are intact. Muscle strength 5/5 in all extremities. Sensation intact. Gait not checked.  PSYCHIATRIC: The patient is alert and oriented x 3.  SKIN: No obvious rash, lesion, or ulcer.   Physical Exam LABORATORY PANEL:   CBC  Recent Labs Lab 02/14/16 0520  WBC 6.5  HGB 13.7  HCT 42.7  PLT 297   ------------------------------------------------------------------------------------------------------------------  Chemistries   Recent Labs Lab 02/13/16 1740 02/14/16 0520  NA  --  138  K  --  3.6  CL  --  100*  CO2  --  29  GLUCOSE  --  93  BUN  --  23*  CREATININE  --  1.65*  CALCIUM  --  8.7*  MG 2.3  --    ------------------------------------------------------------------------------------------------------------------  Cardiac Enzymes  Recent Labs Lab 02/13/16 1740 02/13/16 2341  TROPONINI 0.03* 0.04*   ------------------------------------------------------------------------------------------------------------------  RADIOLOGY:  Dg Chest 2 View  Result Date: 02/13/2016 CLINICAL DATA:  Chest pain, shortness of breath. EXAM: CHEST  2 VIEW COMPARISON:  Radiographs of Jun 10, 2015. FINDINGS: The heart size and mediastinal contours are within normal limits. Both lungs are clear. No pneumothorax or pleural effusion is noted. The visualized skeletal structures are unremarkable. IMPRESSION: No active cardiopulmonary disease. Electronically Signed   By: Lupita Raider, M.D.   On: 02/13/2016 08:58    ASSESSMENT AND PLAN:   Active Problems:   Acute CHF (congestive heart failure) (HCC)   1. acute on ch systolic CHF   EF is 25% per past echo, get a repeat Echo.  treat with IV Lasix, Started  patient on Coreg and lisinopril Cardiology consult appreciated.  2. Chest pain with negative cardiac catheter in 2016 likely related to accelerated hypertension   on aspirin Negative cardiac  enzymes.  3. Accelerated hypertension  Coreg and lisinopril. He will also be on Lasix will adjust his regimen as needed will place him on IV hydralazine when necessary  4. Morbid obesity weight loss recommended  5. Sleep apnea noncompliant with CPAP machine  6. Chronic kidney disease avoid nephrotoxins monitor renal function with Lasix and ACE inhibitor  7. misc Lovenox for DVT prophylaxis   All the records are reviewed and case discussed with Care Management/Social Workerr. Management plans discussed with the patient, family and they are in agreement.  CODE STATUS: Full.  TOTAL TIME TAKING CARE OF THIS PATIENT: 35 minutes.    POSSIBLE D/C IN 1-2 DAYS, DEPENDING ON CLINICAL CONDITION.   Altamese Dilling M.D on 02/14/2016   Between 7am to 6pm - Pager - [phone]  After 6pm go to [email or web address] - Social research officer, government  [facility]  Office  [phone]  CC: Primary care physician; No PCP Per Patient  Note: This dictation was prepared with Dragon dictation along with smaller phrase technology. Any transcriptional errors that result from this process are unintentional.

## 2016-02-14 NOTE — Plan of Care (Signed)
Problem: Health Behavior/Discharge Planning: Goal: Ability to manage health-related needs will improve for discharge Outcome: Progressing Reviewed CHF education with patient. Encouraged daily weights once discharged and importance of maintaining a low sodium diet.

## 2016-02-15 LAB — BASIC METABOLIC PANEL
ANION GAP: 6 (ref 5–15)
BUN: 24 mg/dL — ABNORMAL HIGH (ref 6–20)
CHLORIDE: 102 mmol/L (ref 101–111)
CO2: 30 mmol/L (ref 22–32)
Calcium: 8.4 mg/dL — ABNORMAL LOW (ref 8.9–10.3)
Creatinine, Ser: 1.38 mg/dL — ABNORMAL HIGH (ref 0.61–1.24)
Glucose, Bld: 126 mg/dL — ABNORMAL HIGH (ref 65–99)
POTASSIUM: 3.4 mmol/L — AB (ref 3.5–5.1)
Sodium: 138 mmol/L (ref 135–145)

## 2016-02-15 LAB — ECHOCARDIOGRAM COMPLETE
Height: 67 in
Weight: 5140.8 oz

## 2016-02-15 MED ORDER — LISINOPRIL 5 MG PO TABS: 5.0000 mg | ORAL_TABLET | Freq: Every day | ORAL | 2 refills | Status: DC

## 2016-02-15 MED ORDER — FUROSEMIDE 20 MG PO TABS
40.0000 mg | ORAL_TABLET | Freq: Every day | ORAL | 3 refills | Status: DC
Start: 2016-02-15 — End: 2016-08-27

## 2016-02-15 MED ORDER — ASPIRIN EC 81 MG PO TBEC: 81.0000 mg | DELAYED_RELEASE_TABLET | Freq: Every day | ORAL | 2 refills | Status: DC

## 2016-02-15 MED ORDER — CARVEDILOL 25 MG PO TABS: 25.0000 mg | ORAL_TABLET | Freq: Two times a day (BID) | ORAL | 2 refills | Status: DC

## 2016-02-15 NOTE — Care Management Note (Signed)
Case Management Note  Patient Details  Name: Noah Rodriguez MRN: [id number] Date of Birth: [birth year]  Subjective/Objective:        New medications for uninsured Noah Rodriguez are on the $4.00 list at [facility]. Carvedilol= $4.00 at [facility], Furosemide= $4.00 at [facility], Lisinopril.= $4.00 at [facility].             Action/Plan:   Expected Discharge Date:  02/15/16               Expected Discharge Plan:     In-House Referral:     Discharge planning Services     Post Acute Care Choice:    Choice offered to:     DME Arranged:    DME Agency:     HH Arranged:    HH Agency:     Status of Service:     If discussed at [employer], dates discussed:    Additional Comments:  Rueben Kassim A, RN 02/15/2016, 10:59 AM

## 2016-02-15 NOTE — Progress Notes (Signed)
[facility] Cardiology  SUBJECTIVE: I feel better   Vitals:   02/14/16 1158 02/14/16 1920 02/15/16 0429 02/15/16 0436  BP:  134/81 (!) 152/107 (!) 149/99  Pulse:  86 82 82  Resp:  18 18   Temp:  97.7 F (36.5 C) 97.9 F (36.6 C)   TempSrc:  Oral Oral   SpO2:  96% 100% 100%  Weight: (!) 145.7 kg (321 lb 4.8 oz)  (!) 146.1 kg (322 lb)   Height:         Intake/Output Summary (Last 24 hours) at 02/15/16 2440 Last data filed at 02/15/16 1027  Gross per 24 hour  Intake                0 ml  Output             2210 ml  Net            -2210 ml      PHYSICAL EXAM  General: Well developed, well nourished, in no acute distress HEENT:  Normocephalic and atramatic Neck:  No JVD.  Lungs: Clear bilaterally to auscultation and percussion. Heart: HRRR . Normal S1 and S2 without gallops or murmurs.  Abdomen: Bowel sounds are positive, abdomen soft and non-tender  Msk:  Back normal, normal gait. Normal strength and tone for age. Extremities: No clubbing, cyanosis or edema.   Neuro: Alert and oriented X 3. Psych:  Good affect, responds appropriately   LABS: Basic Metabolic Panel:  Recent Labs  25/36/64 1740 02/14/16 0520 02/15/16 0623  NA  --  138 138  K  --  3.6 3.4*  CL  --  100* 102  CO2  --  29 30  GLUCOSE  --  93 126*  BUN  --  23* 24*  CREATININE  --  1.65* 1.38*  CALCIUM  --  8.7* 8.4*  MG 2.3  --   --    Liver Function Tests: No results for input(s): AST, ALT, ALKPHOS, BILITOT, PROT, ALBUMIN in the last 72 hours. No results for input(s): LIPASE, AMYLASE in the last 72 hours. CBC:  Recent Labs  02/13/16 0841 02/14/16 0520  WBC 6.5 6.5  HGB 14.0 13.7  HCT 41.8 42.7  MCV 82.6 83.1  PLT 306 297   Cardiac Enzymes:  Recent Labs  02/13/16 0841 02/13/16 1740 02/13/16 2341  TROPONINI 0.05* 0.03* 0.04*   BNP: Invalid input(s): POCBNP D-Dimer: No results for input(s): DDIMER in the last 72 hours. Hemoglobin A1C: No results for input(s): HGBA1C in the last 72  hours. Fasting Lipid Panel: No results for input(s): CHOL, HDL, LDLCALC, TRIG, CHOLHDL, LDLDIRECT in the last 72 hours. Thyroid Function Tests: No results for input(s): TSH, T4TOTAL, T3FREE, THYROIDAB in the last 72 hours.  Invalid input(s): FREET3 Anemia Panel: No results for input(s): VITAMINB12, FOLATE, FERRITIN, TIBC, IRON, RETICCTPCT in the last 72 hours.  No results found.   Echo LV EF 30-35% by echocardiogram 09/10/2014  TELEMETRY: Normal sinus rhythm:  ASSESSMENT AND PLAN:  Active Problems:   Acute CHF (congestive heart failure) (HCC)    1. Acute on chronic systolic congestive heart failure, exacerbated by dietary and medical noncompliance, improved after diuresis 2. Chest pain, atypical, borderline elevated troponin, with known normal coronary anatomy by cardiac catheterization 09/17/2014  Recommendations  1. Agree with current therapy 2. Continue maintenance furosemide as outpatient 3. Strongly encouraged patient to adhere to low-sodium diet 4. Strongly encouraged patient to be compliant with his medical regimen  Sign off for now,  please call if any questions   Marcina Millard, MD, PhD, [facility] 02/15/2016 9:10 AM

## 2016-02-15 NOTE — Discharge Instructions (Signed)
Heart Failure Clinic appointment on February 25, 2016 at 9:00am with Clarisa Kindred, FNP. Please call [phone] to reschedule.   Fluid restriction up to 1500 ml daily Low salt diet. Daily weigh your self, if > 2 Lb gain in a day or > 5 Lb in 1 week- take lasix 20 mg 2 times a day for 2 days- and still not able to get rid of extra weight- call your doctor or cardiologist.

## 2016-02-15 NOTE — Discharge Summary (Signed)
[facility] - [place] at [facility]   PATIENT NAME: Noah Rodriguez    MR#:  [id number]  DATE OF BIRTH:  09-13-1975  DATE OF ADMISSION:  02/13/2016 ADMITTING PHYSICIAN: Auburn Bilberry, MD  DATE OF DISCHARGE: 02/15/2016  PRIMARY CARE PHYSICIAN: No PCP Per Patient    ADMISSION DIAGNOSIS:  Atypical chest pain [R07.89] Acute on chronic congestive heart failure, unspecified congestive heart failure type (HCC) [I50.9]  DISCHARGE DIAGNOSIS:  Active Problems:   Acute on ch systolic CHF (congestive heart failure) (HCC)   SECONDARY DIAGNOSIS:   Past Medical History:  Diagnosis Date  . Acute kidney injury (HCC)   . Asthma   . Chronic combined systolic and diastolic CHF (congestive heart failure) (HCC)    EF < 25% on Cath (09/17/2014)  . Gout of big toe 09/16/2014  . H/O medication noncompliance   . Hypertensive heart disease   . Hypokalemia   . MI (myocardial infarction)    Patient denies having had a MI  . NICM (nonischemic cardiomyopathy) (HCC)    a. cardiac cath 09/2014 with normal coronary arteries, EF 20-25% with severe global HK; b. echo 08/2014: EF 30-35%, false tendon in LV aepx of no clinical sig, diffuse HK, GR1DD, aortic root 40 mm, trivial MR, trivial pericardial effusion posterior   . Obesity, Class III, BMI 40-49.9 (morbid obesity) (HCC)   . Sleep apnea    a. sleep study 05/2010; b. noncompliant with CPAP  . Ventral hernia    a. incarcerated omentum 06/10/2015    HOSPITAL COURSE:   1. acute on ch systolic CHF   EF is 25% per past echo, get a repeat Echo.  treat with IV Lasix, oral lasix on discharge. Started patient on Coreg and lisinopril Cardiology consult appreciated.  Scheduled appointment in heart failure clinic.  Instructed about fluid restriction and salt restrictions.   Given prescriptions for 2-3 months refill, as he does not have PMD, and advised to get one soon.   Instructed about 4 $ medication plans.  2. Chest pain with  negative cardiac catheter in 2016 likely related to accelerated hypertension   on aspirin Negative cardiac enzymes.  3. Accelerated hypertension  Coreg and lisinopril. He will also be on Lasix   on IV hydralazine when necessary   BP stable with this oral regimen.  4. Morbid obesity weight loss recommended  5. Sleep apnea noncompliant with CPAP machine  6. Chronic kidney disease avoid nephrotoxins monitor renal function with Lasix and ACE inhibitor   Stable.  7. misc Lovenox for DVT prophylaxis   DISCHARGE CONDITIONS:   Stable.  CONSULTS OBTAINED:  Treatment Team:  Marcina Millard, MD  DRUG ALLERGIES:  No Known Allergies  DISCHARGE MEDICATIONS:   Current Discharge Medication List    START taking these medications   Details  aspirin EC 81 MG tablet Take 1 tablet (81 mg total) by mouth daily. Qty: 30 tablet, Refills: 2    carvedilol (COREG) 25 MG tablet Take 1 tablet (25 mg total) by mouth 2 (two) times daily with a meal. Qty: 60 tablet, Refills: 2    lisinopril (PRINIVIL,ZESTRIL) 5 MG tablet Take 1 tablet (5 mg total) by mouth daily. Qty: 30 tablet, Refills: 2      CONTINUE these medications which have CHANGED   Details  furosemide (LASIX) 20 MG tablet Take 2 tablets (40 mg total) by mouth daily. Qty: 30 tablet, Refills: 3      CONTINUE these medications which have NOT CHANGED   Details  albuterol (PROVENTIL HFA;VENTOLIN HFA) 108 (90 BASE) MCG/ACT inhaler Inhale 2 puffs into the lungs every 6 (six) hours as needed for wheezing or shortness of breath. Qty: 1 Inhaler, Refills: 11   Associated Diagnoses: Mild persistent asthma, uncomplicated      STOP taking these medications     famotidine (PEPCID) 20 MG tablet          DISCHARGE INSTRUCTIONS:    Follow with heart failure clinic. Fluid and salt restrictions. Fluid restriction up to 1200 ml daily Low salt diet. Daily weigh your self, if > 2 Lb gain in a day or > 5 Lb in 1 week- take lasix  20 mg 2 times a day for 2 days- and still not able to get rid of extra weight- call your doctor or cardiologist.    If you experience worsening of your admission symptoms, develop shortness of breath, life threatening emergency, suicidal or homicidal thoughts you must seek medical attention immediately by calling 911 or calling your MD immediately  if symptoms less severe.  You Must read complete instructions/literature along with all the possible adverse reactions/side effects for all the Medicines you take and that have been prescribed to you. Take any new Medicines after you have completely understood and accept all the possible adverse reactions/side effects.   Please note  You were cared for by a hospitalist during your hospital stay. If you have any questions about your discharge medications or the care you received while you were in the hospital after you are discharged, you can call the unit and asked to speak with the hospitalist on call if the hospitalist that took care of you is not available. Once you are discharged, your primary care physician will handle any further medical issues. Please note that NO REFILLS for any discharge medications will be authorized once you are discharged, as it is imperative that you return to your primary care physician (or establish a relationship with a primary care physician if you do not have one) for your aftercare needs so that they can reassess your need for medications and monitor your lab values.    Today   CHIEF COMPLAINT:   Chief Complaint  Patient presents with  . Shortness of Breath  . Chest Pain    HISTORY OF PRESENT ILLNESS:  Noah Rodriguez  is a 41 y.o. male with a known history of Nonischemic cardiomyopathy, asthma, essential hypertension, gout, morbid obesity, sleep apnea noncompliant with CPAP) presenting to the emergency room with complaint of shortness of breath and chest pain. Patient reports that he lost his insurance to  work and has not been able to get any medication since last year. Patient reports that he has had progressive shortness of breath over the past few weeks. He has noticed swelling of the lower extremity. Unable to lay flat. He is also noticed swelling of the lower extremity. Patient also has left-sided chest pressure. He is noted to have elevated blood pressure in the emergency room.  VITAL SIGNS:  Blood pressure 103/62, pulse 66, temperature 97.5 F (36.4 C), temperature source Oral, resp. rate 20, height 5\' 7"  (1.702 m), weight (!) 146.1 kg (322 lb), SpO2 95 %.  I/O:   Intake/Output Summary (Last 24 hours) at 02/15/16 1046 Last data filed at 02/15/16 1041  Gross per 24 hour  Intake              240 ml  Output             2610  ml  Net            -2370 ml    PHYSICAL EXAMINATION:  GENERAL:  41 y.o.-year-old obese patient lying in the bed with no acute distress.  EYES: Pupils equal, round, reactive to light and accommodation. No scleral icterus. Extraocular muscles intact.  HEENT: Head atraumatic, normocephalic. Oropharynx and nasopharynx clear.  NECK:  Supple, no jugular venous distention. No thyroid enlargement, no tenderness.  LUNGS: Normal breath sounds bilaterally, no wheezing, rales,rhonchi or crepitation. No use of accessory muscles of respiration.  CARDIOVASCULAR: S1, S2 normal. No murmurs, rubs, or gallops.  ABDOMEN: Soft, non-tender, non-distended. Bowel sounds present. No organomegaly or mass.  EXTREMITIES: No pedal edema, cyanosis, or clubbing.  NEUROLOGIC: Cranial nerves II through XII are intact. Muscle strength 5/5 in all extremities. Sensation intact. Gait not checked.  PSYCHIATRIC: The patient is alert and oriented x 3.  SKIN: No obvious rash, lesion, or ulcer.   DATA REVIEW:   CBC  Recent Labs Lab 02/14/16 0520  WBC 6.5  HGB 13.7  HCT 42.7  PLT 297    Chemistries   Recent Labs Lab 02/13/16 1740  02/15/16 0623  NA  --   < > 138  K  --   < > 3.4*  CL   --   < > 102  CO2  --   < > 30  GLUCOSE  --   < > 126*  BUN  --   < > 24*  CREATININE  --   < > 1.38*  CALCIUM  --   < > 8.4*  MG 2.3  --   --   < > = values in this interval not displayed.  Cardiac Enzymes  Recent Labs Lab 02/13/16 2341  TROPONINI 0.04*    Microbiology Results  Results for orders placed or performed during the hospital encounter of 09/09/14  MRSA PCR Screening     Status: None   Collection Time: 09/09/14  7:10 PM  Result Value Ref Range Status   MRSA by PCR NEGATIVE NEGATIVE Final    Comment:        The GeneXpert MRSA Assay (FDA approved for NASAL specimens only), is one component of a comprehensive MRSA colonization surveillance program. It is not intended to diagnose MRSA infection nor to guide or monitor treatment for MRSA infections.     RADIOLOGY:  No results found.  EKG:   Orders placed or performed during the hospital encounter of 02/13/16  . EKG 12-Lead  . EKG 12-Lead      Management plans discussed with the patient, family and they are in agreement.  CODE STATUS:     Code Status Orders        Start     Ordered   02/13/16 1140  Full code  Continuous     02/13/16 1139    Code Status History    Date Active Date Inactive Code Status Order ID Comments User Context   06/10/2015 10:38 AM 06/12/2015  4:22 PM Full Code [id number]  Lattie Haw, MD ED   09/17/2014 12:37 PM 09/20/2014  6:09 PM Full Code [id number]  Lennette Bihari, MD Inpatient   09/09/2014  4:53 PM 09/17/2014 12:37 PM Full Code [id number]  Manson Passey, PA Inpatient      TOTAL TIME TAKING CARE OF THIS PATIENT: 35 minutes.    Altamese Dilling M.D on 02/15/2016 at 10:46 AM  Between 7am to 6pm - Pager - [phone]  After 6pm go to [email or web address] -  password Environmental education officer  [facility] [facility]  Office  [phone]  CC: Primary care physician; No PCP Per Patient   Note: This dictation was prepared with Dragon dictation along with smaller phrase  technology. Any transcriptional errors that result from this process are unintentional.

## 2016-02-25 ENCOUNTER — Ambulatory Visit: Payer: Self-pay | Attending: Family | Admitting: Family

## 2016-02-25 ENCOUNTER — Encounter: Payer: Self-pay | Admitting: Family

## 2016-02-25 VITALS — BP 148/93 | HR 88 | Resp 18 | Ht 67.0 in | Wt 329.0 lb

## 2016-02-25 DIAGNOSIS — I110 Hypertensive heart disease with heart failure: Secondary | ICD-10-CM | POA: Insufficient documentation

## 2016-02-25 DIAGNOSIS — Z6841 Body Mass Index (BMI) 40.0 and over, adult: Secondary | ICD-10-CM | POA: Insufficient documentation

## 2016-02-25 DIAGNOSIS — I5042 Chronic combined systolic (congestive) and diastolic (congestive) heart failure: Secondary | ICD-10-CM | POA: Insufficient documentation

## 2016-02-25 DIAGNOSIS — J45909 Unspecified asthma, uncomplicated: Secondary | ICD-10-CM | POA: Insufficient documentation

## 2016-02-25 DIAGNOSIS — G4733 Obstructive sleep apnea (adult) (pediatric): Secondary | ICD-10-CM | POA: Insufficient documentation

## 2016-02-25 DIAGNOSIS — Z7982 Long term (current) use of aspirin: Secondary | ICD-10-CM | POA: Insufficient documentation

## 2016-02-25 DIAGNOSIS — M109 Gout, unspecified: Secondary | ICD-10-CM | POA: Insufficient documentation

## 2016-02-25 DIAGNOSIS — E876 Hypokalemia: Secondary | ICD-10-CM | POA: Insufficient documentation

## 2016-02-25 DIAGNOSIS — I10 Essential (primary) hypertension: Secondary | ICD-10-CM

## 2016-02-25 DIAGNOSIS — Z79899 Other long term (current) drug therapy: Secondary | ICD-10-CM | POA: Insufficient documentation

## 2016-02-25 DIAGNOSIS — I5022 Chronic systolic (congestive) heart failure: Secondary | ICD-10-CM | POA: Insufficient documentation

## 2016-02-25 DIAGNOSIS — I429 Cardiomyopathy, unspecified: Secondary | ICD-10-CM | POA: Insufficient documentation

## 2016-02-25 DIAGNOSIS — Z0000 Encounter for general adult medical examination without abnormal findings: Secondary | ICD-10-CM

## 2016-02-25 NOTE — Progress Notes (Signed)
Patient ID: Noah Rodriguez, male    DOB: [birth year], 41 y.o.   MRN: [id number]  HPI  Noah Rodriguez is a 41 y/o male with a history of obstructive sleep apnea, obesity, hypokalemia, gout, asthma and chronic heart failure.   Last echo was done 02/14/16 and showed an EF of 35-40% along with mild Noah/TR. EF has improved slightly from August 2016. Last cardiac catheterization was done 09/17/14 and showed an EF of 20-25% at that time. No CAD present.   Was admitted 02/13/16 with HF exacerbation. Was initially treated with IV diuretics and then transitioned to oral diuretics. Cardiology consult was done. Discharged home after 2 days. Was in the ED on 07/31/15 for uvulitis. He was treated and released home.   He presents today for his initial visit with fatigue and shortness of breath upon moderate exertion. Symptoms are quickly relieved upon rest. Has a chronic dry cough. Occasionally has some right sided chest wall pain. Hasn't been wearing his CPAP as he says that he can't tolerate it. 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 Currently without any medical insurance.   Past Medical History:  Diagnosis Date  . Acute kidney injury (HCC)   . Asthma   . Chronic combined systolic and diastolic CHF (congestive heart failure) (HCC)    EF < 25% on Cath (09/17/2014)  . Gout of big toe 09/16/2014  . H/O medication noncompliance   . Hypertensive heart disease   . Hypokalemia   . MI (myocardial infarction)    Patient denies having had a MI  . NICM (nonischemic cardiomyopathy) (HCC)    a. cardiac cath 09/2014 with normal coronary arteries, EF 20-25% with severe global HK; b. echo 08/2014: EF 30-35%, false tendon in LV aepx of no clinical sig, diffuse HK, GR1DD, aortic root 40 mm, trivial Noah, trivial pericardial effusion posterior   . Obesity, Class III, BMI 40-49.9 (morbid obesity) (HCC)   . Sleep apnea    a. sleep study 05/2010; b. noncompliant with CPAP  . Ventral hernia    a. incarcerated omentum 06/10/2015    Past Surgical History:  Procedure Laterality Date  . CARDIAC CATHETERIZATION N/A 09/17/2014   Procedure: Right/Left Heart Cath and Coronary Angiography;  Surgeon: Lennette Bihari, MD;  Location: [facility] INVASIVE CV LAB;  Service: Cardiovascular;  Laterality: N/A;  . VENTRAL HERNIA REPAIR N/A 06/11/2015   Procedure: HERNIA REPAIR VENTRAL ADULT;  Surgeon: Lattie Haw, MD;  Location: [facility] ORS;  Service: General;  Laterality: N/A;   Family History  Problem Relation Age of Onset  . HIV Mother   . Hypertension Mother   . Hypertension Other   . Hypertension Maternal Grandmother   . Hypertension Paternal Grandmother   . Alcohol abuse Neg Hx   . Cancer Neg Hx   . Early death Neg Hx   . Heart disease Neg Hx   . Hyperlipidemia Neg Hx   . Kidney disease Neg Hx   . Stroke Neg Hx   . Heart attack Neg Hx    Social History  Substance Use Topics  . Smoking status: Never Smoker  . Smokeless tobacco: Never Used  . Alcohol use No   No Known Allergies  Prior to Admission medications   Medication Sig Start Date End Date Taking? Authorizing Provider  aspirin EC 81 MG tablet Take 1 tablet (81 mg total) by mouth daily. 02/15/16  Yes Altamese Dilling, MD  carvedilol (COREG) 25 MG tablet Take 1 tablet (25 mg total) by mouth  2 (two) times daily with a meal. 02/15/16  Yes Altamese Dilling, MD  furosemide (LASIX) 20 MG tablet Take 2 tablets (40 mg total) by mouth daily. 02/15/16  Yes Altamese Dilling, MD  lisinopril (PRINIVIL,ZESTRIL) 5 MG tablet Take 1 tablet (5 mg total) by mouth daily. 02/15/16  Yes Altamese Dilling, MD  albuterol (PROVENTIL HFA;VENTOLIN HFA) 108 (90 BASE) MCG/ACT inhaler Inhale 2 puffs into the lungs every 6 (six) hours as needed for wheezing or shortness of breath. Patient not taking: Reported on 02/13/2016 01/16/14   Etta Grandchild, MD    Review of Systems  Constitutional: Positive for fatigue. Negative for appetite change.  HENT: Negative for congestion, postnasal drip  and sore throat.   Eyes: Negative.   Respiratory: Positive for cough (dry) and shortness of breath. Negative for chest tightness.   Cardiovascular: Positive for chest pain (right sided at times). Negative for palpitations and leg swelling.  Gastrointestinal: Negative for abdominal distention and abdominal pain.  Endocrine: Negative.   Genitourinary: Negative.   Musculoskeletal: Negative for back pain and neck pain.  Skin: Negative.   Allergic/Immunologic: Negative.   Neurological: Negative for dizziness and light-headedness.  Hematological: Negative for adenopathy. Does not bruise/bleed easily.  Psychiatric/Behavioral: Positive for sleep disturbance (sleeping on 2-3 pillows; waking up feeling tired). Negative for dysphoric mood and suicidal ideas. The patient is not nervous/anxious.    Vitals:   02/25/16 0859  BP: (!) 148/93  Pulse: 88  Resp: 18  SpO2: 98%  Weight: (!) 329 lb (149.2 kg)  Height: 5\' 7"  (1.702 m)   Wt Readings from Last 3 Encounters:  02/25/16 (!) 329 lb (149.2 kg)  02/15/16 (!) 322 lb (146.1 kg)  07/31/15 (!) 318 lb (144.2 kg)   Lab Results  Component Value Date   CREATININE 1.38 (H) 02/15/2016   CREATININE 1.65 (H) 02/14/2016   CREATININE 1.30 (H) 02/13/2016   Physical Exam  Constitutional: He is oriented to person, place, and time. He appears well-developed and well-nourished.  HENT:  Head: Normocephalic and atraumatic.  Eyes: Conjunctivae are normal. Pupils are equal, round, and reactive to light.  Neck: Normal range of motion. Neck supple. No JVD present.  Cardiovascular: Normal rate and regular rhythm.   Pulmonary/Chest: Effort normal. He has no wheezes. He has no rales.  Abdominal: Soft. He exhibits no distension. There is no tenderness.  Musculoskeletal: He exhibits no edema or tenderness.  Neurological: He is alert and oriented to person, place, and time.  Skin: Skin is warm and dry.  Psychiatric: He has a normal mood and affect. His behavior is  normal. Thought content normal.  Nursing note and vitals reviewed.  Assessment & Plan:  1: Chronic heart failure with reduced ejection fraction- - NYHA class II - euvolemic - already weighing daily. Weight is up 7 pounds from hospital discharge. Discussed the importance of calling for an overnight weight gain of >2 pounds or a weekly weight gain of >5 pounds - not adding salt to his food and is reading food labels. Discussed the importance of closely following a 2000mg  sodium diet.  - discussed changing his lisinopril to entresto - information given on [facility] so that he can call them to get application done  2: HTN- - BP mildly elevated today - may change to entresto at his next visit - has paperwork to fill out for [facility] - does work night shift and sleeps during the day  3: Obstructive sleep apnea- - does have CPAP at  home but doesn't wear it as he says that it's not comfortable. Discussed the relationship between HTN, HF and untreated sleep apnea. - encouraged him to wear it for short periods of time so that he can get used to it. If unable to tolerate the full mask, he may benefit from nasal mask or nasal pillows - he likes to sleep on his side or stomach so has difficulty with the mask because of that. He says that he'll try to begin wearing it to see if he can get used to it.  4: Healthcare maintenance- - he currently has no medical insurance - phone number given to patient for patient financial services to see if he qualifies for any reduction in his bills  Medication bottles were reviewed.  Return here in 1 month or sooner for any questions/problems before then.

## 2016-02-25 NOTE — Patient Instructions (Addendum)
Resume weighing daily and call for an overnight weight gain of > 2 pounds or a weekly weight gain of >5 pounds.   [facility]   [phone]  [address] [place] [address]  Hours: Tuesday: 4:15 pm - 7:30 pm  Wednesday: 9 am - 1 pm  Thursday: 1 pm - 7:30 pm  Friday - Monday: CLOSED  [facility]    [phone]  [facility]    [phone]  For sodium information at restaurants please visit:   [email or web address]

## 2016-03-22 ENCOUNTER — Encounter: Payer: Self-pay | Admitting: Family

## 2016-03-22 ENCOUNTER — Ambulatory Visit: Payer: Self-pay | Attending: Family | Admitting: Family

## 2016-03-22 VITALS — BP 142/90 | HR 88 | Resp 20 | Ht 67.0 in | Wt 332.4 lb

## 2016-03-22 DIAGNOSIS — I10 Essential (primary) hypertension: Secondary | ICD-10-CM

## 2016-03-22 DIAGNOSIS — E876 Hypokalemia: Secondary | ICD-10-CM | POA: Insufficient documentation

## 2016-03-22 DIAGNOSIS — I5022 Chronic systolic (congestive) heart failure: Secondary | ICD-10-CM

## 2016-03-22 DIAGNOSIS — I429 Cardiomyopathy, unspecified: Secondary | ICD-10-CM | POA: Insufficient documentation

## 2016-03-22 DIAGNOSIS — G4733 Obstructive sleep apnea (adult) (pediatric): Secondary | ICD-10-CM | POA: Insufficient documentation

## 2016-03-22 DIAGNOSIS — Z6841 Body Mass Index (BMI) 40.0 and over, adult: Secondary | ICD-10-CM | POA: Insufficient documentation

## 2016-03-22 DIAGNOSIS — J45909 Unspecified asthma, uncomplicated: Secondary | ICD-10-CM | POA: Insufficient documentation

## 2016-03-22 DIAGNOSIS — I110 Hypertensive heart disease with heart failure: Secondary | ICD-10-CM | POA: Insufficient documentation

## 2016-03-22 DIAGNOSIS — Z8249 Family history of ischemic heart disease and other diseases of the circulatory system: Secondary | ICD-10-CM | POA: Insufficient documentation

## 2016-03-22 DIAGNOSIS — I252 Old myocardial infarction: Secondary | ICD-10-CM | POA: Insufficient documentation

## 2016-03-22 DIAGNOSIS — I5042 Chronic combined systolic (congestive) and diastolic (congestive) heart failure: Secondary | ICD-10-CM | POA: Insufficient documentation

## 2016-03-22 DIAGNOSIS — Z7982 Long term (current) use of aspirin: Secondary | ICD-10-CM | POA: Insufficient documentation

## 2016-03-22 DIAGNOSIS — Z79899 Other long term (current) drug therapy: Secondary | ICD-10-CM | POA: Insufficient documentation

## 2016-03-22 MED ORDER — SACUBITRIL-VALSARTAN 24-26 MG PO TABS: 1.0000 | ORAL_TABLET | Freq: Two times a day (BID) | ORAL | 3 refills | Status: DC

## 2016-03-22 NOTE — Progress Notes (Signed)
Patient ID: Noah Rodriguez, male    DOB: Dec 26, 1975, 41 y.o.   MRN: [id number]  HPI  Noah Rodriguez is a 41 y/o male with a history of obstructive sleep apnea, obesity, hypokalemia, gout, asthma and chronic heart failure.   Last echo was done 02/14/16 and showed an EF of 35-40% along with mild Noah/TR. EF has improved slightly from August 2016. Last cardiac catheterization was done 09/17/14 and showed an EF of 20-25% at that time. No CAD present.   Was admitted 02/13/16 with HF exacerbation. Was initially treated with IV diuretics and then transitioned to oral diuretics. Cardiology consult was done. Discharged home after 2 days. Was in the ED on 07/31/15 for uvulitis. He was treated and released home.   He presents today for a follow-up visit with fatigue upon moderate exertion. Symptoms are quickly relieved upon rest. Has a chronic dry cough. Hasn't been wearing his CPAP as he says that he can't tolerate it. 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 Currently without any medical insurance. Has only been taking his carvedilol once daily.  Past Medical History:  Diagnosis Date  . Acute kidney injury (HCC)   . Asthma   . Chronic combined systolic and diastolic CHF (congestive heart failure) (HCC)    EF < 25% on Cath (09/17/2014)  . Gout of big toe 09/16/2014  . H/O medication noncompliance   . Hypertensive heart disease   . Hypokalemia   . MI (myocardial infarction)    Patient denies having had a MI  . NICM (nonischemic cardiomyopathy) (HCC)    a. cardiac cath 09/2014 with normal coronary arteries, EF 20-25% with severe global HK; b. echo 08/2014: EF 30-35%, false tendon in LV aepx of no clinical sig, diffuse HK, GR1DD, aortic root 40 mm, trivial Noah, trivial pericardial effusion posterior   . Obesity, Class III, BMI 40-49.9 (morbid obesity) (HCC)   . Sleep apnea    a. sleep study 05/2010; b. noncompliant with CPAP  . Ventral hernia    a. incarcerated omentum 06/10/2015   Past Surgical History:   Procedure Laterality Date  . CARDIAC CATHETERIZATION N/A 09/17/2014   Procedure: Right/Left Heart Cath and Coronary Angiography;  Surgeon: Lennette Bihari, MD;  Location: [facility] INVASIVE CV LAB;  Service: Cardiovascular;  Laterality: N/A;  . VENTRAL HERNIA REPAIR N/A 06/11/2015   Procedure: HERNIA REPAIR VENTRAL ADULT;  Surgeon: Lattie Haw, MD;  Location: [facility] ORS;  Service: General;  Laterality: N/A;   Family History  Problem Relation Age of Onset  . HIV Mother   . Hypertension Mother   . Hypertension Other   . Hypertension Maternal Grandmother   . Hypertension Paternal Grandmother   . Alcohol abuse Neg Hx   . Cancer Neg Hx   . Early death Neg Hx   . Heart disease Neg Hx   . Hyperlipidemia Neg Hx   . Kidney disease Neg Hx   . Stroke Neg Hx   . Heart attack Neg Hx    Social History  Substance Use Topics  . Smoking status: Never Smoker  . Smokeless tobacco: Never Used  . Alcohol use No   No Known Allergies   Prior to Admission medications   Medication Sig Start Date End Date Taking? Authorizing Provider  aspirin EC 81 MG tablet Take 1 tablet (81 mg total) by mouth daily. 02/15/16  Yes Altamese Dilling, MD  carvedilol (COREG) 25 MG tablet Take 1 tablet (25 mg total) by mouth 2 (two) times daily  with a meal. 02/15/16  Yes Altamese Dilling, MD  furosemide (LASIX) 20 MG tablet Take 2 tablets (40 mg total) by mouth daily. 02/15/16  Yes Altamese Dilling, MD  lisinopril (PRINIVIL,ZESTRIL) 5 MG tablet Take 1 tablet (5 mg total) by mouth daily. 02/15/16  Yes Altamese Dilling, MD  albuterol (PROVENTIL HFA;VENTOLIN HFA) 108 (90 BASE) MCG/ACT inhaler Inhale 2 puffs into the lungs every 6 (six) hours as needed for wheezing or shortness of breath. Patient not taking: Reported on 02/13/2016 01/16/14   Etta Grandchild, MD     Review of Systems  Constitutional: Positive for fatigue. Negative for appetite change.  HENT: Negative for congestion, postnasal drip and sore throat.   Eyes:  Negative.   Respiratory: Positive for cough (dry). Negative for chest tightness and shortness of breath.   Cardiovascular: Negative for chest pain, palpitations and leg swelling.  Gastrointestinal: Negative for abdominal distention and abdominal pain.  Endocrine: Negative.   Genitourinary: Negative.   Musculoskeletal: Negative for back pain and neck pain.  Skin: Negative.   Allergic/Immunologic: Negative.   Neurological: Negative for dizziness and light-headedness.  Hematological: Negative for adenopathy. Does not bruise/bleed easily.  Psychiatric/Behavioral: Negative for dysphoric mood, sleep disturbance and suicidal ideas. The patient is not nervous/anxious.    Vitals:   03/22/16 0855 03/22/16 0910  BP: (!) 148/105 (!) 142/90  Pulse: 88   Resp: 20    Wt Readings from Last 3 Encounters:  03/22/16 (!) 332 lb 6 oz (150.8 kg)  02/25/16 (!) 329 lb (149.2 kg)  02/15/16 (!) 322 lb (146.1 kg)   Lab Results  Component Value Date   CREATININE 1.38 (H) 02/15/2016   CREATININE 1.65 (H) 02/14/2016   CREATININE 1.30 (H) 02/13/2016    Physical Exam  Constitutional: He is oriented to person, place, and time. He appears well-developed and well-nourished.  HENT:  Head: Normocephalic and atraumatic.  Eyes: Conjunctivae are normal. Pupils are equal, round, and reactive to light.  Neck: Normal range of motion. Neck supple. No JVD present.  Cardiovascular: Normal rate and regular rhythm.   Pulmonary/Chest: Effort normal. He has no wheezes. He has no rales.  Abdominal: Soft. He exhibits no distension. There is no tenderness.  Musculoskeletal: He exhibits no edema or tenderness.  Neurological: He is alert and oriented to person, place, and time.  Skin: Skin is warm and dry.  Psychiatric: He has a normal mood and affect. His behavior is normal. Thought content normal.  Nursing note and vitals reviewed.    Assessment & Plan:  1: Chronic heart failure with reduced ejection fraction- - NYHA  class II - euvolemic - already weighing daily. Weight is up 3 pounds from last visit. Discussed the importance of calling for an overnight weight gain of >2 pounds or a weekly weight gain of >5 pounds - not adding salt to his food and is reading food labels. Discussed the importance of closely following a 2000mg  sodium diet.  - will stop his lisinopril and begin entresto 24/26mg  twice daily. Instructions given for patient to not take anymore lisinopril (already took it today) and then begin entresto on 03/24/16 twice daily. 30 day voucher card given to him so that he can get it at no charge. Also gave him 14 samples to get him started (Lot [id number]/ EXP 09/19) - dry cough may go away with stopping of the lisinopril - [facility] but is waiting to hear back from them  2: HTN- - BP mildly elevated today -  has only been taking his carvedilol daily instead of twice daily. Instructed that he needs to take it twice daily.  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  - he has not tried wearing the CPAP even for short periods. Discussed the importance of trying to wear it so that he can get used to it.  - he likes to sleep on his side or stomach so has difficulty with the mask because of that. He says that he'll try to begin wearing it to see if he can get used to it.  Medication bottles were reviewed.  Return here in 1 month or sooner for any questions/problems before then. Will draw labs at that time. Last GFR on 02/15/16 was >60.    Medication bottles were reviewed.

## 2016-03-22 NOTE — Patient Instructions (Addendum)
Continue weighing daily and call for an overnight weight gain of > 2 pounds or a weekly weight gain of >5 pounds.  Do not take anymore lisinopril. Begin entresto twice daily on Wednesday.

## 2016-03-23 ENCOUNTER — Ambulatory Visit: Payer: Self-pay | Admitting: Family

## 2016-04-21 ENCOUNTER — Ambulatory Visit: Payer: Self-pay | Admitting: Pharmacy Technician

## 2016-04-22 ENCOUNTER — Telehealth: Payer: Self-pay | Admitting: Family

## 2016-04-22 ENCOUNTER — Ambulatory Visit: Payer: Self-pay | Admitting: Family

## 2016-04-22 NOTE — Telephone Encounter (Signed)
Patient did not show for his [facility] appointment on 04/22/16. Will attempt to reschedule.

## 2016-04-22 NOTE — Progress Notes (Signed)
Patient scheduled for eligibility appointment at [facility].  Patient did not show for the appointment on 04/21/16 at 3:30p.m.  Patient did not reschedule eligibility appointment.  [facility] will be unable to provide additional medication assistance until eligibility is determined.  Sherilyn Dacosta Care Manager [facility]

## 2016-04-29 ENCOUNTER — Encounter: Payer: Self-pay | Admitting: Family

## 2016-04-29 ENCOUNTER — Ambulatory Visit: Payer: Self-pay | Attending: Family | Admitting: Family

## 2016-04-29 VITALS — BP 141/87 | HR 80 | Resp 20 | Ht 67.0 in | Wt 333.5 lb

## 2016-04-29 DIAGNOSIS — M109 Gout, unspecified: Secondary | ICD-10-CM | POA: Insufficient documentation

## 2016-04-29 DIAGNOSIS — Z830 Family history of human immunodeficiency virus [HIV] disease: Secondary | ICD-10-CM | POA: Insufficient documentation

## 2016-04-29 DIAGNOSIS — Z6841 Body Mass Index (BMI) 40.0 and over, adult: Secondary | ICD-10-CM | POA: Insufficient documentation

## 2016-04-29 DIAGNOSIS — G4733 Obstructive sleep apnea (adult) (pediatric): Secondary | ICD-10-CM

## 2016-04-29 DIAGNOSIS — I110 Hypertensive heart disease with heart failure: Secondary | ICD-10-CM | POA: Insufficient documentation

## 2016-04-29 DIAGNOSIS — E669 Obesity, unspecified: Secondary | ICD-10-CM | POA: Insufficient documentation

## 2016-04-29 DIAGNOSIS — Z7982 Long term (current) use of aspirin: Secondary | ICD-10-CM | POA: Insufficient documentation

## 2016-04-29 DIAGNOSIS — J45909 Unspecified asthma, uncomplicated: Secondary | ICD-10-CM | POA: Insufficient documentation

## 2016-04-29 DIAGNOSIS — I10 Essential (primary) hypertension: Secondary | ICD-10-CM

## 2016-04-29 DIAGNOSIS — Z8249 Family history of ischemic heart disease and other diseases of the circulatory system: Secondary | ICD-10-CM | POA: Insufficient documentation

## 2016-04-29 DIAGNOSIS — I5022 Chronic systolic (congestive) heart failure: Secondary | ICD-10-CM

## 2016-04-29 DIAGNOSIS — Z79899 Other long term (current) drug therapy: Secondary | ICD-10-CM | POA: Insufficient documentation

## 2016-04-29 DIAGNOSIS — E876 Hypokalemia: Secondary | ICD-10-CM | POA: Insufficient documentation

## 2016-04-29 DIAGNOSIS — I429 Cardiomyopathy, unspecified: Secondary | ICD-10-CM | POA: Insufficient documentation

## 2016-04-29 DIAGNOSIS — I5042 Chronic combined systolic (congestive) and diastolic (congestive) heart failure: Secondary | ICD-10-CM | POA: Insufficient documentation

## 2016-04-29 LAB — BASIC METABOLIC PANEL
ANION GAP: 7 (ref 5–15)
BUN: 20 mg/dL (ref 6–20)
CHLORIDE: 104 mmol/L (ref 101–111)
CO2: 26 mmol/L (ref 22–32)
Calcium: 9 mg/dL (ref 8.9–10.3)
Creatinine, Ser: 1.22 mg/dL (ref 0.61–1.24)
GFR calc non Af Amer: >60 mL/min (ref >60)
GLUCOSE: 132 mg/dL — AB (ref 65–99)
Potassium: 4 mmol/L (ref 3.5–5.1)
Sodium: 137 mmol/L (ref 135–145)

## 2016-04-29 NOTE — Patient Instructions (Addendum)
Continue weighing daily and call for an overnight weight gain of > 2 pounds or a weekly weight gain of >5 pounds.  Finish taking entresto 24/26mg  and once it's finished begin taking entresto 49/51mg  twice daily.

## 2016-04-29 NOTE — Progress Notes (Signed)
Patient ID: Noah Rodriguez, male    DOB: [birth year], 41 y.o.   MRN: [id number]  HPI  Noah Rodriguez is a 41 y/o male with a history of obstructive sleep apnea, obesity, hypokalemia, gout, asthma and chronic heart failure.   Last echo was done 02/14/16 and showed an EF of 35-40% along with mild Noah/TR. EF has improved slightly from August 2016. Last cardiac catheterization was done 09/17/14 and showed an EF of 20-25% at that time. No CAD present.   Was admitted 02/13/16 with HF exacerbation. Was initially treated with IV diuretics and then transitioned to oral diuretics. Cardiology consult was done. Discharged home after 2 days. Was in the ED on 07/31/15 for uvulitis. He was treated and released home.   He presents today for a follow-up visit with a chief complaint of mild shortness of breath with moderate exertion. Shortness of breath is improved at rest. Says that he's had shortness of breath for many months and it's not getting any worse. Has associated fatigue, dry cough and wheezing. Not sleeping well either but he does work night shift.   Past Medical History:  Diagnosis Date  . Acute kidney injury (HCC)   . Asthma   . Chronic combined systolic and diastolic CHF (congestive heart failure) (HCC)    EF < 25% on Cath (09/17/2014)  . Gout of big toe 09/16/2014  . H/O medication noncompliance   . Hypertensive heart disease   . Hypokalemia   . MI (myocardial infarction) [facility])    Patient denies having had a MI  . NICM (nonischemic cardiomyopathy) (HCC)    a. cardiac cath 09/2014 with normal coronary arteries, EF 20-25% with severe global HK; b. echo 08/2014: EF 30-35%, false tendon in LV aepx of no clinical sig, diffuse HK, GR1DD, aortic root 40 mm, trivial Noah, trivial pericardial effusion posterior   . Obesity, Class III, BMI 40-49.9 (morbid obesity) (HCC)   . Sleep apnea    a. sleep study 05/2010; b. noncompliant with CPAP  . Ventral hernia    a. incarcerated omentum 06/10/2015   Past Surgical  History:  Procedure Laterality Date  . CARDIAC CATHETERIZATION N/A 09/17/2014   Procedure: Right/Left Heart Cath and Coronary Angiography;  Surgeon: Lennette Bihari, MD;  Location: [facility] INVASIVE CV LAB;  Service: Cardiovascular;  Laterality: N/A;  . VENTRAL HERNIA REPAIR N/A 06/11/2015   Procedure: HERNIA REPAIR VENTRAL ADULT;  Surgeon: Lattie Haw, MD;  Location: [facility] ORS;  Service: General;  Laterality: N/A;   Family History  Problem Relation Age of Onset  . HIV Mother   . Hypertension Mother   . Hypertension Other   . Hypertension Maternal Grandmother   . Hypertension Paternal Grandmother   . Alcohol abuse Neg Hx   . Cancer Neg Hx   . Early death Neg Hx   . Heart disease Neg Hx   . Hyperlipidemia Neg Hx   . Kidney disease Neg Hx   . Stroke Neg Hx   . Heart attack Neg Hx    Social History  Substance Use Topics  . Smoking status: Never Smoker  . Smokeless tobacco: Never Used  . Alcohol use No   No Known Allergies Prior to Admission medications   Medication Sig Start Date End Date Taking? Authorizing Provider  aspirin EC 81 MG tablet Take 1 tablet (81 mg total) by mouth daily. 02/15/16  Yes Altamese Dilling, MD  carvedilol (COREG) 25 MG tablet Take 1 tablet (25 mg total) by mouth 2 (two) times  daily with a meal. 02/15/16  Yes Altamese Dilling, MD  furosemide (LASIX) 20 MG tablet Take 2 tablets (40 mg total) by mouth daily. 02/15/16  Yes Altamese Dilling, MD  sacubitril-valsartan (ENTRESTO) 24-26 MG Take 1 tablet by mouth 2 (two) times daily. 03/22/16  Yes Delma Freeze, FNP  albuterol (PROVENTIL HFA;VENTOLIN HFA) 108 (90 BASE) MCG/ACT inhaler Inhale 2 puffs into the lungs every 6 (six) hours as needed for wheezing or shortness of breath. Patient not taking: Reported on 02/13/2016 01/16/14   Etta Grandchild, MD     Review of Systems  Constitutional: Positive for fatigue. Negative for appetite change.  HENT: Negative for congestion, postnasal drip and sore throat.   Eyes:  Negative.   Respiratory: Positive for cough (dry cough), shortness of breath and wheezing. Negative for chest tightness.   Cardiovascular: Negative for chest pain, palpitations and leg swelling.  Gastrointestinal: Negative for abdominal distention and abdominal pain.  Endocrine: Negative.   Genitourinary: Negative.   Musculoskeletal: Negative for back pain and neck pain.  Skin: Negative.   Allergic/Immunologic: Negative.   Neurological: Negative for dizziness and light-headedness.  Hematological: Negative for adenopathy. Does not bruise/bleed easily.  Psychiatric/Behavioral: Positive for sleep disturbance (not sleeping well ). Negative for dysphoric mood and suicidal ideas. The patient is not nervous/anxious.    Vitals:   04/29/16 0912  BP: (!) 141/87  Pulse: 80  Resp: 20  SpO2: 96%  Weight: (!) 333 lb 8 oz (151.3 kg)  Height: 5\' 7"  (1.702 m)   Wt Readings from Last 3 Encounters:  04/29/16 (!) 333 lb 8 oz (151.3 kg)  03/22/16 (!) 332 lb 6 oz (150.8 kg)  02/25/16 (!) 329 lb (149.2 kg)   Lab Results  Component Value Date   CREATININE 1.38 (H) 02/15/2016   CREATININE 1.65 (H) 02/14/2016   CREATININE 1.30 (H) 02/13/2016    Physical Exam  Constitutional: He is oriented to person, place, and time. He appears well-developed and well-nourished.  HENT:  Head: Normocephalic and atraumatic.  Neck: Normal range of motion. Neck supple. No JVD present.  Cardiovascular: Normal rate and regular rhythm.   Pulmonary/Chest: Effort normal. He has no wheezes. He has no rales.  Abdominal: Soft. He exhibits no distension. There is no tenderness.  Musculoskeletal: He exhibits no edema or tenderness.  Neurological: He is alert and oriented to person, place, and time.  Skin: Skin is warm and dry.  Psychiatric: He has a normal mood and affect. His behavior is normal. Thought content normal.  Nursing note and vitals reviewed.   Assessment & Plan:  1: Chronic heart failure with reduced ejection  fraction- - NYHA class II - euvolemic - already weighing daily. Weight stable from last visit. Discussed the importance of calling for an overnight weight gain of >2 pounds or a weekly weight gain of >5 pounds - not adding salt to his food and is reading food labels. Discussed the importance of closely following a 2000mg  sodium diet.  - tolerating entresto without known side effects - he is to finish out his current dose of entresto and then begin entresto 49/51mg  twice daily. 28 samples of 49/51mg  given to patient (Lot [id number], Exp 10/19) - [facility] with [facility]. Has been rescheduled for 06/11/16.  2: HTN- - BP better today - been taking his carvedilol twice daily.   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  - he has not tried wearing the CPAP even for short periods. Discussed the importance of trying to wear it so that he can get used to it.  - he likes to sleep on his side or stomach so has difficulty with the mask because of that. He says that he'll try to begin wearing it to see if he can get used to it.  Medication bottles were reviewed.  Return here in 1 month or sooner for any questions/problems before then. Marland Kitchen

## 2016-05-31 ENCOUNTER — Ambulatory Visit: Payer: Self-pay | Attending: Family | Admitting: Family

## 2016-05-31 ENCOUNTER — Encounter: Payer: Self-pay | Admitting: Family

## 2016-05-31 VITALS — BP 147/91 | HR 86 | Resp 20 | Ht 67.0 in | Wt 337.1 lb

## 2016-05-31 DIAGNOSIS — E876 Hypokalemia: Secondary | ICD-10-CM | POA: Insufficient documentation

## 2016-05-31 DIAGNOSIS — I110 Hypertensive heart disease with heart failure: Secondary | ICD-10-CM | POA: Insufficient documentation

## 2016-05-31 DIAGNOSIS — Z6841 Body Mass Index (BMI) 40.0 and over, adult: Secondary | ICD-10-CM | POA: Insufficient documentation

## 2016-05-31 DIAGNOSIS — I10 Essential (primary) hypertension: Secondary | ICD-10-CM

## 2016-05-31 DIAGNOSIS — J45909 Unspecified asthma, uncomplicated: Secondary | ICD-10-CM | POA: Insufficient documentation

## 2016-05-31 DIAGNOSIS — I5042 Chronic combined systolic (congestive) and diastolic (congestive) heart failure: Secondary | ICD-10-CM | POA: Insufficient documentation

## 2016-05-31 DIAGNOSIS — I252 Old myocardial infarction: Secondary | ICD-10-CM | POA: Insufficient documentation

## 2016-05-31 DIAGNOSIS — Z7982 Long term (current) use of aspirin: Secondary | ICD-10-CM | POA: Insufficient documentation

## 2016-05-31 DIAGNOSIS — M109 Gout, unspecified: Secondary | ICD-10-CM | POA: Insufficient documentation

## 2016-05-31 DIAGNOSIS — I5022 Chronic systolic (congestive) heart failure: Secondary | ICD-10-CM

## 2016-05-31 DIAGNOSIS — G4733 Obstructive sleep apnea (adult) (pediatric): Secondary | ICD-10-CM | POA: Insufficient documentation

## 2016-05-31 DIAGNOSIS — E669 Obesity, unspecified: Secondary | ICD-10-CM | POA: Insufficient documentation

## 2016-05-31 DIAGNOSIS — I428 Other cardiomyopathies: Secondary | ICD-10-CM | POA: Insufficient documentation

## 2016-05-31 DIAGNOSIS — Z79899 Other long term (current) drug therapy: Secondary | ICD-10-CM | POA: Insufficient documentation

## 2016-05-31 NOTE — Progress Notes (Signed)
Patient ID: Noah Rodriguez, male    DOB: 1975/09/19, 41 y.o.   MRN: [id number]  HPI  Noah Rodriguez is a 41 y/o male with a history of obstructive sleep apnea, obesity, hypokalemia, gout, asthma and chronic heart failure.   Last echo was done 02/14/16 and showed an EF of 35-40% along with mild Noah/TR. EF has improved slightly from August 2016. Last cardiac catheterization was done 09/17/14 and showed an EF of 20-25% at that time. No CAD present.   Was admitted 02/13/16 with HF exacerbation. Was initially treated with IV diuretics and then transitioned to oral diuretics. Cardiology consult was done. Discharged home after 2 days. Was in the ED on 07/31/15 for uvulitis. He was treated and released home.   He presents today for a follow-up visit with a chief complaint of mild shortness of breath with moderate exertion. Shortness of breath is improved at rest. Says that he's had shortness of breath for many months and it's not getting any worse. Has associated fatigue, dry cough and wheezing. Not sleeping well either but he does work night shift.   Past Medical History:  Diagnosis Date  . Acute kidney injury (HCC)   . Asthma   . Chronic combined systolic and diastolic CHF (congestive heart failure) (HCC)    EF < 25% on Cath (09/17/2014)  . Gout of big toe 09/16/2014  . H/O medication noncompliance   . Hypertensive heart disease   . Hypokalemia   . MI (myocardial infarction) [facility])    Patient denies having had a MI  . NICM (nonischemic cardiomyopathy) (HCC)    a. cardiac cath 09/2014 with normal coronary arteries, EF 20-25% with severe global HK; b. echo 08/2014: EF 30-35%, false tendon in LV aepx of no clinical sig, diffuse HK, GR1DD, aortic root 40 mm, trivial Noah, trivial pericardial effusion posterior   . Obesity, Class III, BMI 40-49.9 (morbid obesity) (HCC)   . Sleep apnea    a. sleep study 05/2010; b. noncompliant with CPAP  . Ventral hernia    a. incarcerated omentum 06/10/2015   Past Surgical  History:  Procedure Laterality Date  . CARDIAC CATHETERIZATION N/A 09/17/2014   Procedure: Right/Left Heart Cath and Coronary Angiography;  Surgeon: Lennette Bihari, MD;  Location: [facility] INVASIVE CV LAB;  Service: Cardiovascular;  Laterality: N/A;  . VENTRAL HERNIA REPAIR N/A 06/11/2015   Procedure: HERNIA REPAIR VENTRAL ADULT;  Surgeon: Lattie Haw, MD;  Location: [facility] ORS;  Service: General;  Laterality: N/A;   Family History  Problem Relation Age of Onset  . HIV Mother   . Hypertension Mother   . Hypertension Other   . Hypertension Maternal Grandmother   . Hypertension Paternal Grandmother   . Alcohol abuse Neg Hx   . Cancer Neg Hx   . Early death Neg Hx   . Heart disease Neg Hx   . Hyperlipidemia Neg Hx   . Kidney disease Neg Hx   . Stroke Neg Hx   . Heart attack Neg Hx    Social History  Substance Use Topics  . Smoking status: Never Smoker  . Smokeless tobacco: Never Used  . Alcohol use No   No Known Allergies  Prior to Admission medications   Medication Sig Start Date End Date Taking? Authorizing Provider  aspirin EC 81 MG tablet Take 1 tablet (81 mg total) by mouth daily. 02/15/16  Yes Altamese Dilling, MD  carvedilol (COREG) 25 MG tablet Take 1 tablet (25 mg total) by mouth 2 (two)  times daily with a meal. 02/15/16  Yes Altamese Dilling, MD  furosemide (LASIX) 20 MG tablet Take 2 tablets (40 mg total) by mouth daily. 02/15/16  Yes Altamese Dilling, MD  sacubitril-valsartan (ENTRESTO) 24-26 MG Take 1 tablet by mouth 2 (two) times daily. 03/22/16  Yes Kymber Kosar, Inetta Fermo A, FNP  albuterol (PROVENTIL HFA;VENTOLIN HFA) 108 (90 BASE) MCG/ACT inhaler Inhale 2 puffs into the lungs every 6 (six) hours as needed for wheezing or shortness of breath. Patient not taking: Reported on 02/13/2016 01/16/14   Etta Grandchild, MD    Review of Systems  Constitutional: Positive for fatigue. Negative for appetite change.  HENT: Negative for congestion, postnasal drip and sore throat.    Eyes: Negative.   Respiratory: Positive for cough (dry cough), shortness of breath and wheezing. Negative for chest tightness.   Cardiovascular: Negative for chest pain, palpitations and leg swelling.  Gastrointestinal: Negative for abdominal distention and abdominal pain.  Endocrine: Negative.   Genitourinary: Negative.   Musculoskeletal: Negative for back pain and neck pain.  Skin: Negative.   Allergic/Immunologic: Negative.   Neurological: Negative for dizziness and light-headedness.  Hematological: Negative for adenopathy. Does not bruise/bleed easily.  Psychiatric/Behavioral: Positive for sleep disturbance (not sleeping well ). Negative for dysphoric mood and suicidal ideas. The patient is not nervous/anxious.    Vitals:   05/31/16 0904  BP: (!) 158/101  Pulse: 86  Resp: 20  SpO2: 98%  Weight: (!) 337 lb 2 oz (152.9 kg)  Height: 5\' 7"  (1.702 m)   Wt Readings from Last 3 Encounters:  05/31/16 (!) 337 lb 2 oz (152.9 kg)  04/29/16 (!) 333 lb 8 oz (151.3 kg)  03/22/16 (!) 332 lb 6 oz (150.8 kg)    Lab Results  Component Value Date   CREATININE 1.22 04/29/2016   CREATININE 1.38 (H) 02/15/2016   CREATININE 1.65 (H) 02/14/2016    Physical Exam  Constitutional: He is oriented to person, place, and time. He appears well-developed and well-nourished.  HENT:  Head: Normocephalic and atraumatic.  Neck: Normal range of motion. Neck supple. No JVD present.  Cardiovascular: Normal rate and regular rhythm.   Pulmonary/Chest: Effort normal. He has no wheezes. He has no rales.  Abdominal: Soft. He exhibits no distension. There is no tenderness.  Musculoskeletal: He exhibits no edema or tenderness.  Neurological: He is alert and oriented to person, place, and time.  Skin: Skin is warm and dry.  Psychiatric: He has a normal mood and affect. His behavior is normal. Thought content normal.  Nursing note and vitals reviewed.   Assessment & Plan:  1: Chronic heart failure with  reduced ejection fraction- - NYHA class II - euvolemic - continues weighing daily. Weight up a few pounds from last visit. Discussed the importance of calling for an overnight weight gain of >2 pounds or a weekly weight gain of >5 pounds - not adding salt to his food and is reading food labels. Discussed the importance of closely following a 2000mg  sodium diet.  - tolerating entresto without known side effects - he still has about 5 days worth left of entresto 24/26mg  and then he will begin the 49/51 mg dose twice daily - [facility] on [facility]16 - will get a BMP at his next visit since he'll have started the higher dose of entresto  2: HTN- - BP elevated initially and then improved with recheck using a manual cuff - been taking his carvedilol twice daily.   -  he has the paperwork for [facility] but he hasn't filled it out yet. 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  - he has not tried wearing the CPAP even for short periods. Discussed the importance of trying to wear it so that he can get used to it.  - he likes to sleep on his side or stomach so has difficulty with the mask because of that and he hasn't tried wearing it any - last sleep study was years ago; once he gets established with [facility], he may need to get it repeated  Patient did not bring his medications nor a list. Each medication was verbally reviewed with the patient and he was encouraged to bring the bottles to every visit to confirm accuracy of list.  Return here in 1 month or sooner for any questions/problems before then. Marland Kitchen

## 2016-05-31 NOTE — Patient Instructions (Signed)
Continue weighing daily and call for an overnight weight gain of > 2 pounds or a weekly weight gain of >5 pounds. 

## 2016-06-11 ENCOUNTER — Ambulatory Visit: Payer: Self-pay | Admitting: Pharmacy Technician

## 2016-06-11 NOTE — Progress Notes (Signed)
Patient scheduled for eligibility appointment at [facility].  Patient did not show for the appointment on June 11, 2016 at 9:00a.m.  Patient did not reschedule eligibility appointment.  This is second time that patient has been a no-show.  [facility] unable to provide additional medication assistance until eligibility is determined.  Sherilyn Dacosta Care Manager [facility]

## 2016-06-30 ENCOUNTER — Ambulatory Visit: Payer: Self-pay | Admitting: Pharmacy Technician

## 2016-06-30 DIAGNOSIS — Z79899 Other long term (current) drug therapy: Secondary | ICD-10-CM

## 2016-06-30 NOTE — Progress Notes (Signed)
Met with patient completed financial assistance application for [place] due to recent hospital visit.  Patient agreed to be responsible for gathering financial information and forwarding to appropriate department in [facility].    Patient's annual gross income exceeds the eligibility limits of [place].  [facility] unable to provide medication assistance to this patient.  Made patient aware.  Patient understood.  Patient stated that his employer does not offer insurance.  Patient has inquired about obtaining a plan through the [facility] but the monthly premium is too expensive.  Patient didn't want to contact [facility] to find out cost of [place].  Would have to see one of their providers.  Wants to continue to be seen by Darylene Price at the [facility].      [facility]

## 2016-07-06 ENCOUNTER — Ambulatory Visit: Payer: Self-pay | Admitting: Family

## 2016-07-07 ENCOUNTER — Encounter: Payer: Self-pay | Admitting: Family

## 2016-07-07 ENCOUNTER — Ambulatory Visit: Payer: Self-pay | Attending: Family | Admitting: Family

## 2016-07-07 VITALS — BP 159/100 | HR 101 | Resp 20 | Ht 67.0 in | Wt 335.4 lb

## 2016-07-07 DIAGNOSIS — I10 Essential (primary) hypertension: Secondary | ICD-10-CM

## 2016-07-07 DIAGNOSIS — I252 Old myocardial infarction: Secondary | ICD-10-CM | POA: Insufficient documentation

## 2016-07-07 DIAGNOSIS — I5022 Chronic systolic (congestive) heart failure: Secondary | ICD-10-CM

## 2016-07-07 DIAGNOSIS — Z7982 Long term (current) use of aspirin: Secondary | ICD-10-CM | POA: Insufficient documentation

## 2016-07-07 DIAGNOSIS — I110 Hypertensive heart disease with heart failure: Secondary | ICD-10-CM | POA: Insufficient documentation

## 2016-07-07 DIAGNOSIS — J45909 Unspecified asthma, uncomplicated: Secondary | ICD-10-CM | POA: Insufficient documentation

## 2016-07-07 DIAGNOSIS — I5042 Chronic combined systolic (congestive) and diastolic (congestive) heart failure: Secondary | ICD-10-CM | POA: Insufficient documentation

## 2016-07-07 DIAGNOSIS — G4733 Obstructive sleep apnea (adult) (pediatric): Secondary | ICD-10-CM | POA: Insufficient documentation

## 2016-07-07 DIAGNOSIS — I429 Cardiomyopathy, unspecified: Secondary | ICD-10-CM | POA: Insufficient documentation

## 2016-07-07 DIAGNOSIS — E876 Hypokalemia: Secondary | ICD-10-CM | POA: Insufficient documentation

## 2016-07-07 DIAGNOSIS — Z9114 Patient's other noncompliance with medication regimen: Secondary | ICD-10-CM | POA: Insufficient documentation

## 2016-07-07 DIAGNOSIS — M109 Gout, unspecified: Secondary | ICD-10-CM | POA: Insufficient documentation

## 2016-07-07 DIAGNOSIS — Z6841 Body Mass Index (BMI) 40.0 and over, adult: Secondary | ICD-10-CM | POA: Insufficient documentation

## 2016-07-07 MED ORDER — SACUBITRIL-VALSARTAN 49-51 MG PO TABS: 1.0000 | ORAL_TABLET | Freq: Two times a day (BID) | ORAL | 5 refills | Status: DC

## 2016-07-07 MED ORDER — SACUBITRIL-VALSARTAN 49-51 MG PO TABS: 1.0000 | ORAL_TABLET | Freq: Two times a day (BID) | ORAL | 0 refills | Status: DC

## 2016-07-07 NOTE — Patient Instructions (Signed)
Continue weighing daily and call for an overnight weight gain of > 2 pounds or a weekly weight gain of >5 pounds. 

## 2016-07-07 NOTE — Progress Notes (Signed)
Patient ID: Noah Rodriguez, male    DOB: 07-15-1975, 41 y.o.   MRN: [id number]  HPI  Mr Stave is a 41 y/o male with a history of obstructive sleep apnea, obesity, hypokalemia, gout, asthma and chronic heart failure.   Last echo was done 02/14/16 and showed an EF of 35-40% along with mild MR/TR. EF has improved slightly from August 2016. Last cardiac catheterization was done 09/17/14 and showed an EF of 20-25% at that time. No CAD present.   Was admitted 02/13/16 with HF exacerbation. Was initially treated with IV diuretics and then transitioned to oral diuretics. Cardiology consult was done. Discharged home after 2 days. Was in the ED on 07/31/15 for uvulitis. He was treated and released home.   He presents today for a follow-up visit with a chief complaint of mild shortness of breath with moderate exertion. Shortness of breath is improved at rest. Says that he's had shortness of breath for many months and it's not getting any worse. Has associated palpitations and pedal edema along with this. Has been out of entresto for the last 2 days because he couldn't afford to buy it.   Past Medical History:  Diagnosis Date  . Acute kidney injury (HCC)   . Asthma   . Chronic combined systolic and diastolic CHF (congestive heart failure) (HCC)    EF < 25% on Cath (09/17/2014)  . Gout of big toe 09/16/2014  . H/O medication noncompliance   . Hypertensive heart disease   . Hypokalemia   . MI (myocardial infarction) [facility])    Patient denies having had a MI  . NICM (nonischemic cardiomyopathy) (HCC)    a. cardiac cath 09/2014 with normal coronary arteries, EF 20-25% with severe global HK; b. echo 08/2014: EF 30-35%, false tendon in LV aepx of no clinical sig, diffuse HK, GR1DD, aortic root 40 mm, trivial MR, trivial pericardial effusion posterior   . Obesity, Class III, BMI 40-49.9 (morbid obesity) (HCC)   . Sleep apnea    a. sleep study 05/2010; b. noncompliant with CPAP  . Ventral hernia    a.  incarcerated omentum 06/10/2015   Past Surgical History:  Procedure Laterality Date  . CARDIAC CATHETERIZATION N/A 09/17/2014   Procedure: Right/Left Heart Cath and Coronary Angiography;  Surgeon: Lennette Bihari, MD;  Location: [facility] INVASIVE CV LAB;  Service: Cardiovascular;  Laterality: N/A;  . VENTRAL HERNIA REPAIR N/A 06/11/2015   Procedure: HERNIA REPAIR VENTRAL ADULT;  Surgeon: Lattie Haw, MD;  Location: [facility] ORS;  Service: General;  Laterality: N/A;   Family History  Problem Relation Age of Onset  . HIV Mother   . Hypertension Mother   . Hypertension Other   . Hypertension Maternal Grandmother   . Hypertension Paternal Grandmother   . Alcohol abuse Neg Hx   . Cancer Neg Hx   . Early death Neg Hx   . Heart disease Neg Hx   . Hyperlipidemia Neg Hx   . Kidney disease Neg Hx   . Stroke Neg Hx   . Heart attack Neg Hx    Social History  Substance Use Topics  . Smoking status: Never Smoker  . Smokeless tobacco: Never Used  . Alcohol use No   No Known Allergies  Prior to Admission medications   Medication Sig Start Date End Date Taking? Authorizing Provider  aspirin EC 81 MG tablet Take 1 tablet (81 mg total) by mouth daily. 02/15/16  Yes Altamese Dilling, MD  carvedilol (COREG) 25 MG tablet Take  1 tablet (25 mg total) by mouth 2 (two) times daily with a meal. 02/15/16  Yes Altamese Dilling, MD  furosemide (LASIX) 20 MG tablet Take 2 tablets (40 mg total) by mouth daily. 02/15/16  Yes Altamese Dilling, MD  albuterol (PROVENTIL HFA;VENTOLIN HFA) 108 (90 BASE) MCG/ACT inhaler Inhale 2 puffs into the lungs every 6 (six) hours as needed for wheezing or shortness of breath. Patient not taking: Reported on 02/13/2016 01/16/14   Etta Grandchild, MD    Review of Systems  Constitutional: Negative for appetite change and fatigue.  HENT: Negative for congestion, postnasal drip and sore throat.   Eyes: Negative.   Respiratory: Positive for shortness of breath. Negative for  cough and chest tightness.   Cardiovascular: Positive for palpitations and leg swelling. Negative for chest pain.  Gastrointestinal: Negative for abdominal distention and abdominal pain.  Endocrine: Negative.   Genitourinary: Negative.        Decreased libido   Musculoskeletal: Negative for back pain and neck pain.  Skin: Negative.   Allergic/Immunologic: Negative.   Neurological: Negative for dizziness and light-headedness.  Hematological: Negative for adenopathy. Does not bruise/bleed easily.  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 Negative for dysphoric mood and suicidal ideas. The patient is not nervous/anxious.    Vitals:   07/07/16 1112  BP: (!) 159/100  Pulse: (!) 101  Resp: 20  SpO2: 98%  Weight: (!) 335 lb 6 oz (152.1 kg)  Height: 5\' 7"  (1.702 m)   Wt Readings from Last 3 Encounters:  07/07/16 (!) 335 lb 6 oz (152.1 kg)  05/31/16 (!) 337 lb 2 oz (152.9 kg)  04/29/16 (!) 333 lb 8 oz (151.3 kg)    Lab Results  Component Value Date   CREATININE 1.22 04/29/2016   CREATININE 1.38 (H) 02/15/2016   CREATININE 1.65 (H) 02/14/2016    Physical Exam  Constitutional: He is oriented to person, place, and time. He appears well-developed and well-nourished.  HENT:  Head: Normocephalic and atraumatic.  Neck: Normal range of motion. Neck supple. No JVD present.  Cardiovascular: Normal rate and regular rhythm.   Pulmonary/Chest: Effort normal. He has no wheezes. He has no rales.  Abdominal: Soft. He exhibits no distension. There is no tenderness.  Musculoskeletal: He exhibits edema (2+ pitting edema in left lower leg and 1+ pitting in right lower leg). He exhibits no tenderness.  Neurological: He is alert and oriented to person, place, and time.  Skin: Skin is warm and dry.  Psychiatric: He has a normal mood and affect. His behavior is normal. Thought content normal.  Nursing note and vitals reviewed.   Assessment & Plan:  1: Chronic  heart failure with reduced ejection fraction- - NYHA class II - euvolemic - continues weighing daily. Weight stable from last visit. Discussed the importance of calling for an overnight weight gain of >2 pounds or a weekly weight gain of >5 pounds - not adding salt to his food and is reading food labels. Discussed the importance of closely following a 2000mg  sodium diet.  - [facility] and was told that he makes too much money and doesn't qualify for their service - CMA called [employer] and patient qualifies for their assistance. entresto resumed at 49/51mg  twice daily - will get a BMP next time since resuming entresto  2: HTN- - BP elevated but he's been out of entresto for the last 2 days - 28 samples of entresto 49/51 given to patient today so he  can get it started while he's waiting on [employer] to mail his medication - been taking his carvedilol twice daily.   - he has the paperwork for [facility] but he still hasn't filled it out yet and he's concerned that he'll be told that he makes too much money. 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  - he has not tried wearing the CPAP even for short periods. Discussed the importance of trying to wear it so that he can get used to it.  - he likes to sleep on his side or stomach so has difficulty with the mask because of that and he hasn't tried wearing it any  Patient did not bring his medications nor a list. Each medication was verbally reviewed with the patient and he was encouraged to bring the bottles to every visit to confirm accuracy of list.  Return here in 2 weeks or sooner for any questions/problems before then. Marland Kitchen

## 2016-07-08 ENCOUNTER — Other Ambulatory Visit: Payer: Self-pay | Admitting: Family

## 2016-07-08 MED ORDER — CARVEDILOL 25 MG PO TABS: 25.0000 mg | ORAL_TABLET | Freq: Two times a day (BID) | ORAL | 5 refills | Status: DC

## 2016-07-28 ENCOUNTER — Ambulatory Visit: Payer: Self-pay | Admitting: Family

## 2016-07-29 ENCOUNTER — Ambulatory Visit: Payer: Self-pay | Attending: Family | Admitting: Family

## 2016-07-29 ENCOUNTER — Encounter: Payer: Self-pay | Admitting: Family

## 2016-07-29 VITALS — BP 131/71 | HR 76 | Resp 20 | Ht 66.0 in | Wt 340.4 lb

## 2016-07-29 DIAGNOSIS — I5022 Chronic systolic (congestive) heart failure: Secondary | ICD-10-CM | POA: Insufficient documentation

## 2016-07-29 DIAGNOSIS — E669 Obesity, unspecified: Secondary | ICD-10-CM | POA: Insufficient documentation

## 2016-07-29 DIAGNOSIS — R002 Palpitations: Secondary | ICD-10-CM | POA: Insufficient documentation

## 2016-07-29 DIAGNOSIS — M109 Gout, unspecified: Secondary | ICD-10-CM | POA: Insufficient documentation

## 2016-07-29 DIAGNOSIS — Z6841 Body Mass Index (BMI) 40.0 and over, adult: Secondary | ICD-10-CM | POA: Insufficient documentation

## 2016-07-29 DIAGNOSIS — I428 Other cardiomyopathies: Secondary | ICD-10-CM | POA: Insufficient documentation

## 2016-07-29 DIAGNOSIS — Z8249 Family history of ischemic heart disease and other diseases of the circulatory system: Secondary | ICD-10-CM | POA: Insufficient documentation

## 2016-07-29 DIAGNOSIS — G4733 Obstructive sleep apnea (adult) (pediatric): Secondary | ICD-10-CM | POA: Insufficient documentation

## 2016-07-29 DIAGNOSIS — E876 Hypokalemia: Secondary | ICD-10-CM | POA: Insufficient documentation

## 2016-07-29 DIAGNOSIS — J45909 Unspecified asthma, uncomplicated: Secondary | ICD-10-CM | POA: Insufficient documentation

## 2016-07-29 DIAGNOSIS — I110 Hypertensive heart disease with heart failure: Secondary | ICD-10-CM | POA: Insufficient documentation

## 2016-07-29 DIAGNOSIS — Z79899 Other long term (current) drug therapy: Secondary | ICD-10-CM | POA: Insufficient documentation

## 2016-07-29 DIAGNOSIS — Z7982 Long term (current) use of aspirin: Secondary | ICD-10-CM | POA: Insufficient documentation

## 2016-07-29 DIAGNOSIS — I10 Essential (primary) hypertension: Secondary | ICD-10-CM

## 2016-07-29 LAB — BASIC METABOLIC PANEL
ANION GAP: 9 (ref 5–15)
BUN: 18 mg/dL (ref 6–20)
CALCIUM: 8.8 mg/dL — AB (ref 8.9–10.3)
CHLORIDE: 102 mmol/L (ref 101–111)
CO2: 29 mmol/L (ref 22–32)
Creatinine, Ser: 1.56 mg/dL — ABNORMAL HIGH (ref 0.61–1.24)
GFR calc non Af Amer: 54 mL/min — ABNORMAL LOW (ref >60)
GLUCOSE: 111 mg/dL — AB (ref 65–99)
Potassium: 3.8 mmol/L (ref 3.5–5.1)
Sodium: 140 mmol/L (ref 135–145)

## 2016-07-29 NOTE — Patient Instructions (Addendum)
Resume weighing daily and call for an overnight weight gain of > 2 pounds or a weekly weight gain of >5 pounds. 

## 2016-07-29 NOTE — Progress Notes (Signed)
Patient ID: Noah Rodriguez, male    DOB: Feb 03, 1975, 41 y.o.   MRN: [id number]  HPI  Noah Rodriguez is a 41 y/o male with a history of obstructive sleep apnea, obesity, hypokalemia, gout, asthma and chronic heart failure.   Last echo was done 02/14/16 and showed an EF of 35-40% along with mild Noah/TR. EF has improved slightly from August 2016. Last cardiac catheterization was done 09/17/14 and showed an EF of 20-25% at that time. No CAD present.   Was admitted 02/13/16 with HF exacerbation. Was initially treated with IV diuretics and then transitioned to oral diuretics. Cardiology consult was done. Discharged home after 2 days. Was in the ED on 07/31/15 for uvulitis. He was treated and released home.   He presents today for a follow-up visit with a chief complaint of mild shortness of breath with moderate exertion. Shortness of breath is improved at rest. Says that he's had shortness of breath for many months and it's not getting any worse. Has associated palpitations and pedal edema along with this.   Past Medical History:  Diagnosis Date  . Acute kidney injury (HCC)   . Asthma   . Chronic combined systolic and diastolic CHF (congestive heart failure) (HCC)    EF < 25% on Cath (09/17/2014)  . Gout of big toe 09/16/2014  . H/O medication noncompliance   . Hypertensive heart disease   . Hypokalemia   . MI (myocardial infarction) [facility])    Patient denies having had a MI  . NICM (nonischemic cardiomyopathy) (HCC)    a. cardiac cath 09/2014 with normal coronary arteries, EF 20-25% with severe global HK; b. echo 08/2014: EF 30-35%, false tendon in LV aepx of no clinical sig, diffuse HK, GR1DD, aortic root 40 mm, trivial Noah, trivial pericardial effusion posterior   . Obesity, Class III, BMI 40-49.9 (morbid obesity) (HCC)   . Sleep apnea    a. sleep study 05/2010; b. noncompliant with CPAP  . Ventral hernia    a. incarcerated omentum 06/10/2015   Past Surgical History:  Procedure Laterality Date  .  CARDIAC CATHETERIZATION N/A 09/17/2014   Procedure: Right/Left Heart Cath and Coronary Angiography;  Surgeon: Lennette Bihari, MD;  Location: [facility] INVASIVE CV LAB;  Service: Cardiovascular;  Laterality: N/A;  . VENTRAL HERNIA REPAIR N/A 06/11/2015   Procedure: HERNIA REPAIR VENTRAL ADULT;  Surgeon: Lattie Haw, MD;  Location: [facility] ORS;  Service: General;  Laterality: N/A;   Family History  Problem Relation Age of Onset  . HIV Mother   . Hypertension Mother   . Hypertension Other   . Hypertension Maternal Grandmother   . Hypertension Paternal Grandmother   . Alcohol abuse Neg Hx   . Cancer Neg Hx   . Early death Neg Hx   . Heart disease Neg Hx   . Hyperlipidemia Neg Hx   . Kidney disease Neg Hx   . Stroke Neg Hx   . Heart attack Neg Hx    Social History  Substance Use Topics  . Smoking status: Never Smoker  . Smokeless tobacco: Never Used  . Alcohol use No   No Known Allergies  Prior to Admission medications   Medication Sig Start Date End Date Taking? Authorizing Provider  albuterol (PROVENTIL HFA;VENTOLIN HFA) 108 (90 BASE) MCG/ACT inhaler Inhale 2 puffs into the lungs every 6 (six) hours as needed for wheezing or shortness of breath. 01/16/14  Yes Etta Grandchild, MD  aspirin EC 81 MG tablet Take 1 tablet (81  mg total) by mouth daily. 02/15/16  Yes Altamese Dilling, MD  carvedilol (COREG) 25 MG tablet Take 1 tablet (25 mg total) by mouth 2 (two) times daily with a meal. 07/08/16  Yes Clarisa Kindred A, FNP  furosemide (LASIX) 20 MG tablet Take 2 tablets (40 mg total) by mouth daily. 02/15/16  Yes Altamese Dilling, MD  sacubitril-valsartan (ENTRESTO) 49-51 MG Take 1 tablet by mouth 2 (two) times daily. 07/07/16  Yes Delma Freeze, FNP   Review of Systems  Constitutional: Negative for appetite change and fatigue.  HENT: Negative for congestion, postnasal drip and sore throat.   Eyes: Negative.   Respiratory: Positive for shortness of breath. Negative for cough and chest  tightness.   Cardiovascular: Positive for palpitations and leg swelling. Negative for chest pain.  Gastrointestinal: Negative for abdominal distention and abdominal pain.  Endocrine: Negative.   Genitourinary: Negative.        Decreased libido   Musculoskeletal: Negative for back pain and neck pain.  Skin: Negative.   Allergic/Immunologic: Negative.   Neurological: Negative for dizziness and light-headedness.  Hematological: Negative for adenopathy. Does not bruise/bleed easily.  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 Negative for dysphoric mood and suicidal ideas. The patient is not nervous/anxious.    Vitals:   07/29/16 1003  BP: 131/71  Pulse: 76  Resp: 20  SpO2: 97%  Weight: (!) 340 lb 6 oz (154.4 kg)  Height: 5\' 6"  (1.676 m)   Wt Readings from Last 3 Encounters:  07/29/16 (!) 340 lb 6 oz (154.4 kg)  07/07/16 (!) 335 lb 6 oz (152.1 kg)  05/31/16 (!) 337 lb 2 oz (152.9 kg)    Lab Results  Component Value Date   CREATININE 1.22 04/29/2016   CREATININE 1.38 (H) 02/15/2016   CREATININE 1.65 (H) 02/14/2016    Physical Exam  Constitutional: He is oriented to person, place, and time. He appears well-developed and well-nourished.  HENT:  Head: Normocephalic and atraumatic.  Neck: Normal range of motion. Neck supple. No JVD present.  Cardiovascular: Normal rate and regular rhythm.   Pulmonary/Chest: Effort normal. He has no wheezes. He has no rales.  Abdominal: Soft. He exhibits no distension. There is no tenderness.  Musculoskeletal: He exhibits edema (trace edema in bilateral lower legs). He exhibits no tenderness.  Neurological: He is alert and oriented to person, place, and time.  Skin: Skin is warm and dry.  Psychiatric: He has a normal mood and affect. His behavior is normal. Thought content normal.  Nursing note and vitals reviewed.   Assessment & Plan:  1: Chronic heart failure with reduced ejection fraction- - NYHA class  II - euvolemic - not weighing daily. Weight up 5 pounds since he was last here. Emphasized the importance of weighing daily and calling for an overnight weight gain of >2 pounds or a weekly weight gain of >5 pounds - not adding salt to his food and is reading food labels. Discussed the importance of closely following a 2000mg  sodium diet.  - BMP drawn today  2: HTN- - BP looks good today   - he has the paperwork for [facility] but he still hasn't filled it out yet and he's concerned that he'll be told that he makes too much money. 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  - he has not tried wearing the CPAP even for short periods. Discussed the importance of trying to wear it so that he can get used to it.  - falling asleep in the exam room and he says that someone will be driving him home  Medication bottles were reviewed  Return here in 3 months or sooner for any questions/problems before then. Marland Kitchen

## 2016-08-23 ENCOUNTER — Telehealth: Payer: Self-pay | Admitting: Family

## 2016-08-23 NOTE — Telephone Encounter (Signed)
Patient called to say that he feels like his gout has flared up in his big toe. He says that it's extremely painful and swollen. Admits that he's been drinking more soda in place of water lately and doesn't know if that's what triggered it or not. Is going to hold his diuretic today to see if that helps.   Advised him to monitor his weight/swelling closely but that it should be ok for him to hold it today only. Stop the soda and resume drinking water. Should his toe continue to bother him, encouraged him to go to an Urgent Care for evaluation. Currently doesn't have any insurance and hasn't gotten established with [facility] yet.   Patient verbalized understanding.

## 2016-08-24 ENCOUNTER — Other Ambulatory Visit: Payer: Self-pay

## 2016-08-24 ENCOUNTER — Emergency Department: Payer: Self-pay

## 2016-08-24 ENCOUNTER — Observation Stay
Admission: EM | Admit: 2016-08-24 | Discharge: 2016-08-25 | Disposition: A | Discharge status: Home / Self Care | Payer: Self-pay | Attending: Internal Medicine | Admitting: Internal Medicine

## 2016-08-24 ENCOUNTER — Encounter: Payer: Self-pay | Admitting: Emergency Medicine

## 2016-08-24 DIAGNOSIS — I130 Hypertensive heart and chronic kidney disease with heart failure and stage 1 through stage 4 chronic kidney disease, or unspecified chronic kidney disease: Secondary | ICD-10-CM | POA: Insufficient documentation

## 2016-08-24 DIAGNOSIS — I10 Essential (primary) hypertension: Secondary | ICD-10-CM

## 2016-08-24 DIAGNOSIS — N1831 Chronic kidney disease, stage 3a: Secondary | ICD-10-CM

## 2016-08-24 DIAGNOSIS — E669 Obesity, unspecified: Secondary | ICD-10-CM

## 2016-08-24 DIAGNOSIS — M109 Gout, unspecified: Principal | ICD-10-CM

## 2016-08-24 DIAGNOSIS — I5042 Chronic combined systolic (congestive) and diastolic (congestive) heart failure: Secondary | ICD-10-CM | POA: Insufficient documentation

## 2016-08-24 DIAGNOSIS — Z9119 Patient's noncompliance with other medical treatment and regimen: Secondary | ICD-10-CM | POA: Insufficient documentation

## 2016-08-24 DIAGNOSIS — Z79899 Other long term (current) drug therapy: Secondary | ICD-10-CM | POA: Insufficient documentation

## 2016-08-24 DIAGNOSIS — N182 Chronic kidney disease, stage 2 (mild): Secondary | ICD-10-CM

## 2016-08-24 DIAGNOSIS — J449 Chronic obstructive pulmonary disease, unspecified: Secondary | ICD-10-CM | POA: Insufficient documentation

## 2016-08-24 DIAGNOSIS — R9431 Abnormal electrocardiogram [ECG] [EKG]: Secondary | ICD-10-CM

## 2016-08-24 DIAGNOSIS — I4510 Unspecified right bundle-branch block: Secondary | ICD-10-CM | POA: Insufficient documentation

## 2016-08-24 DIAGNOSIS — I428 Other cardiomyopathies: Secondary | ICD-10-CM | POA: Insufficient documentation

## 2016-08-24 DIAGNOSIS — R739 Hyperglycemia, unspecified: Secondary | ICD-10-CM | POA: Insufficient documentation

## 2016-08-24 DIAGNOSIS — G4730 Sleep apnea, unspecified: Secondary | ICD-10-CM | POA: Insufficient documentation

## 2016-08-24 DIAGNOSIS — Z6841 Body Mass Index (BMI) 40.0 and over, adult: Secondary | ICD-10-CM | POA: Insufficient documentation

## 2016-08-24 HISTORY — DX: Gout, unspecified: M10.9

## 2016-08-24 LAB — BASIC METABOLIC PANEL
ANION GAP: 8 (ref 5–15)
BUN: 16 mg/dL (ref 6–20)
CALCIUM: 9.4 mg/dL (ref 8.9–10.3)
CHLORIDE: 102 mmol/L (ref 101–111)
CO2: 29 mmol/L (ref 22–32)
Creatinine, Ser: 1.39 mg/dL — ABNORMAL HIGH (ref 0.61–1.24)
GFR calc Af Amer: >60 mL/min (ref >60)
GFR calc non Af Amer: >60 mL/min (ref >60)
GLUCOSE: 106 mg/dL — AB (ref 65–99)
Potassium: 3.8 mmol/L (ref 3.5–5.1)
Sodium: 139 mmol/L (ref 135–145)

## 2016-08-24 LAB — URIC ACID: Uric Acid, Serum: 9.5 mg/dL — ABNORMAL HIGH (ref 4.4–7.6)

## 2016-08-24 LAB — CBC
HEMATOCRIT: 45 % (ref 40.0–52.0)
HEMOGLOBIN: 14.6 g/dL (ref 13.0–18.0)
MCH: 27 pg (ref 26.0–34.0)
MCHC: 32.5 g/dL (ref 32.0–36.0)
MCV: 83 fL (ref 80.0–100.0)
Platelets: 320 10*3/uL (ref 150–440)
RBC: 5.42 MIL/uL (ref 4.40–5.90)
RDW: 14.4 % (ref 11.5–14.5)
WBC: 7.8 10*3/uL (ref 3.8–10.6)

## 2016-08-24 LAB — TROPONIN I: Troponin I: <0.03 ng/mL (ref <0.03)

## 2016-08-24 LAB — TSH: TSH: 0.727 u[IU]/mL (ref 0.350–4.500)

## 2016-08-24 MED ORDER — CARVEDILOL 25 MG PO TABS
ORAL_TABLET | ORAL | Status: AC
  Administered 2016-08-24: 25 mg via ORAL
  Filled 2016-08-24: qty 1

## 2016-08-24 MED ORDER — COLCHICINE 0.6 MG PO TABS
0.6000 mg | ORAL_TABLET | Freq: Once | ORAL | Status: AC
  Administered 2016-08-24: 0.6 mg via ORAL
  Filled 2016-08-24: qty 1

## 2016-08-24 MED ORDER — ASPIRIN 81 MG PO CHEW
CHEWABLE_TABLET | ORAL | Status: AC
  Filled 2016-08-24: qty 4

## 2016-08-24 MED ORDER — PREDNISONE 20 MG PO TABS: 50.0000 mg | ORAL_TABLET | Freq: Every day | ORAL | Status: DC

## 2016-08-24 MED ORDER — DOCUSATE SODIUM 100 MG PO CAPS
100.0000 mg | ORAL_CAPSULE | Freq: Two times a day (BID) | ORAL | Status: DC
  Administered 2016-08-25: 100 mg via ORAL
  Filled 2016-08-24 (×2): qty 1

## 2016-08-24 MED ORDER — SACUBITRIL-VALSARTAN 49-51 MG PO TABS
1.0000 | ORAL_TABLET | Freq: Two times a day (BID) | ORAL | Status: DC
  Administered 2016-08-24 – 2016-08-25 (×3): 1 via ORAL
  Filled 2016-08-24 (×4): qty 1

## 2016-08-24 MED ORDER — ASPIRIN EC 81 MG PO TBEC
81.0000 mg | DELAYED_RELEASE_TABLET | Freq: Every day | ORAL | Status: DC
  Administered 2016-08-25: 81 mg via ORAL
  Filled 2016-08-24: qty 1

## 2016-08-24 MED ORDER — LABETALOL HCL 5 MG/ML IV SOLN
20.0000 mg | Freq: Once | INTRAVENOUS | Status: AC
  Administered 2016-08-24: 20 mg via INTRAVENOUS
  Filled 2016-08-24: qty 4

## 2016-08-24 MED ORDER — SODIUM CHLORIDE 0.9% FLUSH
3.0000 mL | Freq: Two times a day (BID) | INTRAVENOUS | Status: DC
  Administered 2016-08-24 – 2016-08-25 (×3): 3 mL via INTRAVENOUS

## 2016-08-24 MED ORDER — PREDNISONE 50 MG PO TABS
50.0000 mg | ORAL_TABLET | Freq: Every day | ORAL | Status: DC
  Administered 2016-08-25: 50 mg via ORAL
  Filled 2016-08-24: qty 1

## 2016-08-24 MED ORDER — NITROGLYCERIN 2 % TD OINT
0.5000 [in_us] | TOPICAL_OINTMENT | Freq: Three times a day (TID) | TRANSDERMAL | Status: DC
  Administered 2016-08-24: 0.5 [in_us] via TOPICAL
  Filled 2016-08-24 (×2): qty 1

## 2016-08-24 MED ORDER — SODIUM CHLORIDE 0.9 % IV SOLN: 250.0000 mL | INTRAVENOUS | Status: DC | PRN

## 2016-08-24 MED ORDER — HYDROCODONE-ACETAMINOPHEN 5-325 MG PO TABS
1.0000 | ORAL_TABLET | ORAL | Status: DC | PRN
  Administered 2016-08-24: 1 via ORAL
  Administered 2016-08-25: 2 via ORAL
  Administered 2016-08-25: 1 via ORAL
  Filled 2016-08-24 (×2): qty 1
  Filled 2016-08-24: qty 2

## 2016-08-24 MED ORDER — CARVEDILOL 25 MG PO TABS
25.0000 mg | ORAL_TABLET | Freq: Two times a day (BID) | ORAL | Status: DC
  Administered 2016-08-24 – 2016-08-25 (×2): 25 mg via ORAL
  Filled 2016-08-24: qty 1

## 2016-08-24 MED ORDER — ENOXAPARIN SODIUM 40 MG/0.4ML ~~LOC~~ SOLN
40.0000 mg | Freq: Two times a day (BID) | SUBCUTANEOUS | Status: DC
  Administered 2016-08-24 – 2016-08-25 (×2): 40 mg via SUBCUTANEOUS
  Filled 2016-08-24 (×2): qty 0.4

## 2016-08-24 MED ORDER — ACETAMINOPHEN 650 MG RE SUPP: 650.0000 mg | Freq: Four times a day (QID) | RECTAL | Status: DC | PRN

## 2016-08-24 MED ORDER — ONDANSETRON HCL 4 MG PO TABS: 4.0000 mg | ORAL_TABLET | Freq: Four times a day (QID) | ORAL | Status: DC | PRN

## 2016-08-24 MED ORDER — SODIUM CHLORIDE 0.9% FLUSH: 3.0000 mL | INTRAVENOUS | Status: DC | PRN

## 2016-08-24 MED ORDER — SENNA 8.6 MG PO TABS
1.0000 | ORAL_TABLET | Freq: Two times a day (BID) | ORAL | Status: DC
  Administered 2016-08-25: 8.6 mg via ORAL
  Filled 2016-08-24 (×2): qty 1

## 2016-08-24 MED ORDER — ASPIRIN 81 MG PO CHEW
324.0000 mg | CHEWABLE_TABLET | Freq: Once | ORAL | Status: AC
  Administered 2016-08-24: 324 mg via ORAL

## 2016-08-24 MED ORDER — ONDANSETRON HCL 4 MG/2ML IJ SOLN
4.0000 mg | Freq: Four times a day (QID) | INTRAMUSCULAR | Status: DC | PRN
  Filled 2016-08-24: qty 2

## 2016-08-24 MED ORDER — FUROSEMIDE 40 MG PO TABS
40.0000 mg | ORAL_TABLET | Freq: Every day | ORAL | Status: DC
  Administered 2016-08-24 – 2016-08-25 (×2): 40 mg via ORAL
  Filled 2016-08-24 (×2): qty 1

## 2016-08-24 MED ORDER — PREDNISONE 20 MG PO TABS
40.0000 mg | ORAL_TABLET | Freq: Once | ORAL | Status: AC
  Administered 2016-08-24: 40 mg via ORAL
  Filled 2016-08-24: qty 2

## 2016-08-24 MED ORDER — ACETAMINOPHEN 325 MG PO TABS: 650.0000 mg | ORAL_TABLET | Freq: Four times a day (QID) | ORAL | Status: DC | PRN

## 2016-08-24 NOTE — Care Management (Signed)
CM informed by primary nurse that patient has verbalized he has trouble getting his Entresto.  Patient is not followed by any physician but sees Noah [facility].   Spoke with Noah Rodriguez in the clinic and she states she assisted patient with patient assistance application though [facility] and patient approved.  He receives his medication through the mail. CM spoke with patient and he denies having any difficulty obtaining his medications and confirms his entresto is mailed to him.  He completed application for [facility] that this CM provided in feb 2018, but was declined because he made to much money."  His financial situation has not changed.  he works full time for the same company.  Says company does not provided insurance and it is so expensive.  Provided patient list of sliding scale clinics.

## 2016-08-24 NOTE — ED Notes (Signed)
Admitting MD at bedside.

## 2016-08-24 NOTE — H&P (Signed)
[facility] [place] [facility]   PATIENT NAME: Noah Rodriguez    MR#:  [id number]  DATE OF BIRTH:  10-04-1975  DATE OF ADMISSION:  08/24/2016  PRIMARY CARE PHYSICIAN: Patient, No Pcp Per   REQUESTING/REFERRING PHYSICIAN:   CHIEF COMPLAINT:   Chief Complaint  Patient presents with  . Leg Swelling    HISTORY OF PRESENT ILLNESS: Noah Rodriguez  is a 41 y.o. male with a known history of COPD, sleep apnea, noncompliant with CPAP, systolic CHF, cardiomyopathy with ejection fraction of 35-40 % on echocardiogram in February 2018, essential hypertension, normal coronary arteries on cardiac catheterization in August 2016, who presents to the hospital with complaints of right foot pain. According to the patient, he has been having right foot pain. 3. Toe pain for the past 3 or 4 days, pain, discomfort, described as achy worsening whenever he walks. He stopped his heart medication since he thought that right foot pain is medication related. On arrival to emergency room, he was noted to have markedly elevated blood pressure, blood pressure ranging between 180s to 200s, according to emergency room physician. He was given labetalol intravenously with blood pressure improvement to 160/90. He was noted to have EKG changes, T depressions in anterolateral and inferior leads, and although patient denied shortness of breath or chest pains, hospitalist services were contacted for admission.   PAST MEDICAL HISTORY:   Past Medical History:  Diagnosis Date  . Acute kidney injury (HCC)   . Asthma   . Chronic combined systolic and diastolic CHF (congestive heart failure) (HCC)    EF < 25% on Cath (09/17/2014)  . Gout   . Gout of big toe 09/16/2014  . H/O medication noncompliance   . Hypertensive heart disease   . Hypokalemia   . MI (myocardial infarction) [facility])    Patient denies having had a MI  . NICM (nonischemic cardiomyopathy) (HCC)    a. cardiac cath 09/2014 with  normal coronary arteries, EF 20-25% with severe global HK; b. echo 08/2014: EF 30-35%, false tendon in LV aepx of no clinical sig, diffuse HK, GR1DD, aortic root 40 mm, trivial MR, trivial pericardial effusion posterior   . Obesity, Class III, BMI 40-49.9 (morbid obesity) (HCC)   . Sleep apnea    a. sleep study 05/2010; b. noncompliant with CPAP  . Ventral hernia    a. incarcerated omentum 06/10/2015    PAST SURGICAL HISTORY: Past Surgical History:  Procedure Laterality Date  . CARDIAC CATHETERIZATION N/A 09/17/2014   Procedure: Right/Left Heart Cath and Coronary Angiography;  Surgeon: Lennette Bihari, MD;  Location: [facility] INVASIVE CV LAB;  Service: Cardiovascular;  Laterality: N/A;  . VENTRAL HERNIA REPAIR N/A 06/11/2015   Procedure: HERNIA REPAIR VENTRAL ADULT;  Surgeon: Lattie Haw, MD;  Location: [facility] ORS;  Service: General;  Laterality: N/A;    SOCIAL HISTORY:  Social History  Substance Use Topics  . Smoking status: Never Smoker  . Smokeless tobacco: Never Used  . Alcohol use No    FAMILY HISTORY:  Family History  Problem Relation Age of Onset  . HIV Mother   . Hypertension Mother   . Hypertension Other   . Hypertension Maternal Grandmother   . Hypertension Paternal Grandmother   . Alcohol abuse Neg Hx   . Cancer Neg Hx   . Early death Neg Hx   . Heart disease Neg Hx   . Hyperlipidemia Neg Hx   . Kidney disease Neg Hx   .  Stroke Neg Hx   . Heart attack Neg Hx     DRUG ALLERGIES: No Known Allergies  Review of Systems  Constitutional: Negative for chills, fever and weight loss.  HENT: Negative for congestion.   Eyes: Negative for blurred vision and double vision.  Respiratory: Positive for cough, shortness of breath and wheezing. Negative for sputum production.   Cardiovascular: Positive for orthopnea and leg swelling. Negative for chest pain, palpitations and PND.  Gastrointestinal: Negative for abdominal pain, blood in stool, constipation, diarrhea, nausea and  vomiting.  Genitourinary: Negative for dysuria, frequency, hematuria and urgency.  Musculoskeletal: Positive for joint pain. Negative for falls.  Neurological: Negative for dizziness, tremors, focal weakness and headaches.  Endo/Heme/Allergies: Does not bruise/bleed easily.  Psychiatric/Behavioral: Negative for depression. The patient does not have insomnia.     MEDICATIONS AT HOME:  Prior to Admission medications   Medication Sig Start Date End Date Taking? Authorizing Provider  carvedilol (COREG) 25 MG tablet Take 1 tablet (25 mg total) by mouth 2 (two) times daily with a meal. 07/08/16  Yes Clarisa Kindred A, FNP  furosemide (LASIX) 20 MG tablet Take 2 tablets (40 mg total) by mouth daily. 02/15/16  Yes Altamese Dilling, MD  sacubitril-valsartan (ENTRESTO) 49-51 MG Take 1 tablet by mouth 2 (two) times daily. 07/07/16  Yes Hackney, Inetta Fermo A, FNP  albuterol (PROVENTIL HFA;VENTOLIN HFA) 108 (90 BASE) MCG/ACT inhaler Inhale 2 puffs into the lungs every 6 (six) hours as needed for wheezing or shortness of breath. Patient not taking: Reported on 08/24/2016 01/16/14   Etta Grandchild, MD  aspirin EC 81 MG tablet Take 1 tablet (81 mg total) by mouth daily. Patient not taking: Reported on 08/24/2016 02/15/16   Altamese Dilling, MD      PHYSICAL EXAMINATION:   VITAL SIGNS: Blood pressure (!) 159/90, pulse (!) 53, temperature 98 F (36.7 C), temperature source Oral, resp. rate (!) 22, height 5\' 7"  (1.702 m), weight (!) 163.3 kg (360 lb), SpO2 95 %.  GENERAL:  41 y.o.-year-old Morbidly obese patient lying in the bed with no acute distress, slightly uncomfortable, short of breath whenever he moves around.  EYES: Pupils equal, round, reactive to light and accommodation. No scleral icterus. Extraocular muscles intact.  HEENT: Head atraumatic, normocephalic. Oropharynx and nasopharynx clear.  NECK:  Supple, no jugular venous distention. No thyroid enlargement, no tenderness.  LUNGS: Normal breath  sounds bilaterally, no wheezing, rales,rhonchi or crepitation. No use of accessory muscles of respiration.  CARDIOVASCULAR: S1, S2 normal. No murmurs, rubs, or gallops.  ABDOMEN: Soft, nontender, nondistended. Bowel sounds present. No organomegaly or mass.  EXTREMITIES: Trace lower extremity and pedal edema, no cyanosis, or clubbing. Patient has erythema, increased warmth and swelling of right foot, especially first 3 toes, range of motion is decreased due to severe pain NEUROLOGIC: Cranial nerves II through XII are intact. Muscle strength 5/5 in all extremities. Sensation intact. Gait not checked.  PSYCHIATRIC: The patient is alert and oriented x 3.  SKIN: No obvious rash, lesion, or ulcer.   LABORATORY PANEL:   CBC  Recent Labs Lab 08/24/16 0820  WBC 7.8  HGB 14.6  HCT 45.0  PLT 320  MCV 83.0  MCH 27.0  MCHC 32.5  RDW 14.4   ------------------------------------------------------------------------------------------------------------------  Chemistries   Recent Labs Lab 08/24/16 0820  NA 139  K 3.8  CL 102  CO2 29  GLUCOSE 106*  BUN 16  CREATININE 1.39*  CALCIUM 9.4   ------------------------------------------------------------------------------------------------------------------  Cardiac Enzymes  Recent Labs  Lab 08/24/16 0820  TROPONINI <0.03   ------------------------------------------------------------------------------------------------------------------  RADIOLOGY: Dg Chest 2 View  Result Date: 08/24/2016 CLINICAL DATA:  Shortness of breath for 2 days.  No chest pain. EXAM: CHEST  2 VIEW COMPARISON:  06/10/2015 FINDINGS: There is mild bilateral interstitial prominence. There is no focal parenchymal opacity. There is no pleural effusion or pneumothorax. There is stable cardiomegaly. The osseous structures are unremarkable. IMPRESSION: No active cardiopulmonary disease. Electronically Signed   By: Elige Ko   On: 08/24/2016 09:08   Dg Foot 2 Views  Right  Result Date: 08/24/2016 CLINICAL DATA:  Diffuse right foot pain and swelling for 2-3 days. No known injury. EXAM: RIGHT FOOT - 2 VIEW COMPARISON:  None. FINDINGS: Soft tissues of the foot appear swollen. No bony or joint abnormality is identified. IMPRESSION: Soft tissue swelling without bony or joint abnormality. Electronically Signed   By: Drusilla Kanner M.D.   On: 08/24/2016 09:13    EKG: Orders placed or performed during the hospital encounter of 08/24/16  . ED EKG within 10 minutes  . ED EKG within 10 minutes  . Repeat EKG  . Repeat EKG  . Repeat EKG  . Repeat EKG   EKG in the emergency room, initial, revealed a normal sinus rhythm at 85 beats per minute, possible left atrial enlargement, left axis deviation, incomplete right bundle-branch block, ST elevations in V2, V3, repeat the EKG showed T depressions in inferior and anterolateral leads, nonspecific interventricular conduction delay IMPRESSION AND PLAN:  Active Problems:   Acute gout   Malignant essential hypertension   CKD (chronic kidney disease), stage II   Obesity  #1. Acute gout exacerbation in the right foot, initiate patient on prednisone, opiates, getting physical therapist involved for recommendations #2. Malignant essential hypertension due to noncompliance, resume blood pressure medications, follow blood pressure readings, advance medications as needed #3. EKG changes, possibly related to malignant essential hypertension, continue Coreg, and aspirin, nitroglycerin topically, continue Entresto, follow cardiac enzymes 3, get cardiologist involved for further recommendations , if cardiac enzymes are abnormal   #4. CK D stage II, follow with therapy #5. Obesity, patient is noncompliant with CPAP, he may benefit from oxygen at home, get hemoglobin A1c, TSH, lipid panel #6. Hyperglycemia, get hemoglobin A1c to rule out diabetes   All the records are reviewed and case discussed with ED provider. Management  plans discussed with the patient, family and they are in agreement.  CODE STATUS: Code Status History    Date Active Date Inactive Code Status Order ID Comments User Context   02/13/2016 11:39 AM 02/15/2016  6:07 PM Full Code [id number]  Auburn Bilberry, MD Inpatient   06/10/2015 10:38 AM 06/12/2015  4:22 PM Full Code [id number]  Lattie Haw, MD ED   09/17/2014 12:37 PM 09/20/2014  6:09 PM Full Code [id number]  Lennette Bihari, MD Inpatient   09/09/2014  4:53 PM 09/17/2014 12:37 PM Full Code [id number]  Manson Passey, PA Inpatient       TOTAL TIME TAKING CARE OF THIS PATIENT: 55 minutes.    Katharina Caper M.D on 08/24/2016 at 2:00 PM  Between 7am to 6pm - Pager - [phone] After 6pm go to [email or web address] - password [email or web address]  [facility]  Office  [phone]  CC: Primary care physician; Patient, No Pcp Per

## 2016-08-24 NOTE — ED Notes (Signed)
This RN to bedside due to monitor beeping, pt noted to be resting in bed with SO at bedside, NAD noted. Pt denies any needs, will continue to monitor for further patient needs at this time.

## 2016-08-24 NOTE — ED Notes (Signed)
Called pharmacy to send entresto.

## 2016-08-24 NOTE — Progress Notes (Signed)
Anticoagulation monitoring(Lovenox):  41 yo  male ordered Lovenox 40 mg Q24h  Filed Weights   08/24/16 0801  Weight: (!) 360 lb (163.3 kg)   BMI 56.5    Lab Results  Component Value Date   CREATININE 1.39 (H) 08/24/2016   CREATININE 1.56 (H) 07/29/2016   CREATININE 1.22 04/29/2016   Estimated Creatinine Clearance: 103.9 mL/min (A) (by C-G formula based on SCr of 1.39 mg/dL (H)). Hemoglobin & Hematocrit     Component Value Date/Time   HGB 14.6 08/24/2016 0820   HCT 45.0 08/24/2016 0820     Per Protocol for Patient with estCrcl > 30 ml/min and BMI > 40, will transition to Lovenox 40 mg Q12h.

## 2016-08-24 NOTE — ED Notes (Signed)
Dr. Siadecki at bedside at this time.  °

## 2016-08-24 NOTE — ED Triage Notes (Signed)
Pt c/o right foot pain and swelling for 3 days.  Stopped taking bp meds because thought they may be causing this; HTN noted in triage.  No known fevers.  Swelling to right foot/frist 3 digits on foot noted.  Redness to foot with warmth present on exam.  Reports unable to bear weight on this foot.  No hx DM. Wife reports looked exactly same when pt was dx with gout.  Has had a little SHOB as well per pt that is sometimes worse than his baseline.  Denies CP. Hx CHF. Also stopped taking diuretic because foot swelled up

## 2016-08-24 NOTE — Progress Notes (Signed)
41 yo bm admitted to room 237 from ED with pain rt foot.  No distress on ra, cardiac monitor placed on pt and verified, pt denies chest pain at this time.  Lungs diminished lower lobes bil.  Rt foot swollen and taut, warm to touch.  Pt oriented to room and surroundings, POC reviewed with pt.  Skin intact.  Denies need at this time. CB in reach, SR up x 2.

## 2016-08-24 NOTE — ED Notes (Signed)
Pt given cup of water per his request with MD permission, denies any further needs. Will continue to monitor for further patient needs.

## 2016-08-24 NOTE — ED Notes (Signed)
Pt returned from X-ray.  

## 2016-08-24 NOTE — ED Provider Notes (Signed)
[facility] ([facility]) Emergency Department Provider Note ____________________________________________   First MD Initiated Contact with Patient 08/24/16 [phone]     (approximate)  I have reviewed the triage vital signs and the nursing notes.   HISTORY  Chief Complaint Leg Swelling    HPI Noah Rodriguez is a 41 y.o. male with history of CHF, asthma, hypertension, obesity, and gout who presents with right foot pain for 4 days, gradual onset, worsening severity patient denies any preceding trauma. No weakness or numbness. Patient states she has no pain above the foot or radiating into the leg. No new leg swelling. Patient denies chest pain, shortness of breath, generalized weakness or any other acute symptoms. Patient states that the foot pain is similar to when he was diagnosed with gout several years ago.  Past Medical History:  Diagnosis Date  . Acute kidney injury (HCC)   . Asthma   . Chronic combined systolic and diastolic CHF (congestive heart failure) (HCC)    EF < 25% on Cath (09/17/2014)  . Gout   . Gout of big toe 09/16/2014  . H/O medication noncompliance   . Hypertensive heart disease   . Hypokalemia   . MI (myocardial infarction) [facility])    Patient denies having had a MI  . NICM (nonischemic cardiomyopathy) (HCC)    a. cardiac cath 09/2014 with normal coronary arteries, EF 20-25% with severe global HK; b. echo 08/2014: EF 30-35%, false tendon in LV aepx of no clinical sig, diffuse HK, GR1DD, aortic root 40 mm, trivial MR, trivial pericardial effusion posterior   . Obesity, Class III, BMI 40-49.9 (morbid obesity) (HCC)   . Sleep apnea    a. sleep study 05/2010; b. noncompliant with CPAP  . Ventral hernia    a. incarcerated omentum 06/10/2015    Patient Active Problem List   Diagnosis Date Noted  . Chronic systolic heart failure (HCC) 02/25/2016  . Ventral hernia   . Generalized abdominal pain   . Hernia of abdominal cavity   . Gout 09/27/2014  .  Essential hypertension 09/27/2014  . Abnormal cardiac function test 09/15/2014  . Non compliance w medication regimen 09/15/2014  . Chest pain   . Nonischemic cardiomyopathy (HCC) 09/11/2014  . Hypokalemia 09/09/2014  . Elevated troponin 09/09/2014  . Epigastric pain 09/09/2014  . Obesity, Class III, BMI 40-49.9 (morbid obesity) (HCC)   . Sleep apnea   . Hyperglycemia 01/16/2014  . Routine general medical examination at a health care facility 01/16/2014  . Cough 01/16/2014  . Cardiomegaly 01/02/2012  . Mild persistent asthma 12/31/2011  . Hypertensive heart disease 11/26/2009  . CHRONIC GINGIVITIS PLAQUE INDUCED 11/26/2009    Past Surgical History:  Procedure Laterality Date  . CARDIAC CATHETERIZATION N/A 09/17/2014   Procedure: Right/Left Heart Cath and Coronary Angiography;  Surgeon: Lennette Bihari, MD;  Location: [facility] INVASIVE CV LAB;  Service: Cardiovascular;  Laterality: N/A;  . VENTRAL HERNIA REPAIR N/A 06/11/2015   Procedure: HERNIA REPAIR VENTRAL ADULT;  Surgeon: Lattie Haw, MD;  Location: [facility] ORS;  Service: General;  Laterality: N/A;    Prior to Admission medications   Medication Sig Start Date End Date Taking? Authorizing Provider  carvedilol (COREG) 25 MG tablet Take 1 tablet (25 mg total) by mouth 2 (two) times daily with a meal. 07/08/16  Yes Clarisa Kindred A, FNP  furosemide (LASIX) 20 MG tablet Take 2 tablets (40 mg total) by mouth daily. 02/15/16  Yes Altamese Dilling, MD  sacubitril-valsartan (ENTRESTO) 49-51 MG Take 1  tablet by mouth 2 (two) times daily. 07/07/16  Yes Hackney, Inetta Fermo A, FNP  albuterol (PROVENTIL HFA;VENTOLIN HFA) 108 (90 BASE) MCG/ACT inhaler Inhale 2 puffs into the lungs every 6 (six) hours as needed for wheezing or shortness of breath. Patient not taking: Reported on 08/24/2016 01/16/14   Etta Grandchild, MD  aspirin EC 81 MG tablet Take 1 tablet (81 mg total) by mouth daily. Patient not taking: Reported on 08/24/2016 02/15/16   Altamese Dilling, MD    Allergies Patient has no known allergies.  Family History  Problem Relation Age of Onset  . HIV Mother   . Hypertension Mother   . Hypertension Other   . Hypertension Maternal Grandmother   . Hypertension Paternal Grandmother   . Alcohol abuse Neg Hx   . Cancer Neg Hx   . Early death Neg Hx   . Heart disease Neg Hx   . Hyperlipidemia Neg Hx   . Kidney disease Neg Hx   . Stroke Neg Hx   . Heart attack Neg Hx     Social History Social History  Substance Use Topics  . Smoking status: Never Smoker  . Smokeless tobacco: Never Used  . Alcohol use No    Review of Systems  Constitutional: No fever/chills Eyes: No visual changes. ENT: No sore throat. Cardiovascular: Denies chest pain. Respiratory: Denies shortness of breath. Gastrointestinal: No nausea, no vomiting.  No diarrhea.  Genitourinary: Negative for dysuria.  Musculoskeletal: Positive for right foot pain Skin: Negative for rash. Neurological: Negative for headaches, focal weakness or numbness.   ____________________________________________   PHYSICAL EXAM:  VITAL SIGNS: ED Triage Vitals  Enc Vitals Group     BP 08/24/16 0800 (!) 183/117     Pulse Rate 08/24/16 0800 89     Resp 08/24/16 0800 20     Temp 08/24/16 0800 98 F (36.7 C)     Temp Source 08/24/16 0800 Oral     SpO2 08/24/16 0800 95 %     Weight 08/24/16 0801 (!) 360 lb (163.3 kg)     Height 08/24/16 0801 5\' 7"  (1.702 m)     Head Circumference --      Peak Flow --      Pain Score 08/24/16 0800 10     Pain Loc --      Pain Edu? --      Excl. in GC? --     Constitutional: Alert and oriented. Well appearing and in no acute distress. Eyes: Conjunctivae are normal.  Head: Atraumatic. Nose: No congestion/rhinnorhea. Mouth/Throat: Mucous membranes are moist.   Neck: Normal range of motion.  Cardiovascular: Normal rate, regular rhythm. Grossly normal heart sounds.  Good peripheral circulation. Respiratory: Normal  respiratory effort.  No retractions. Lungs CTAB. Gastrointestinal: Soft and nontender. No distention.  Genitourinary: No CVA tenderness. Musculoskeletal: No lower extremity edema.  Extremities warm and well perfused. Right foot with moderate swelling concentrated around the base of the first toe, mild erythema, tender to palpation over the first toe. 2+ DP pulse. No erythema or induration or warmth and no swelling to the ankle, calf or lower leg. Neurologic:  Normal speech and language. No gross focal neurologic deficits are appreciated.  Skin:  Skin is warm and dry. No rash noted. Psychiatric: Mood and affect are normal. Speech and behavior are normal.  ____________________________________________   LABS (all labs ordered are listed, but only abnormal results are displayed)  Labs Reviewed  BASIC METABOLIC PANEL - Abnormal; Notable for the following:  Result Value   Glucose, Bld 106 (*)    Creatinine, Ser 1.39 (*)    All other components within normal limits  CBC  TROPONIN I  TROPONIN I   ____________________________________________  EKG  ED ECG REPORT I, Dionne Bucy, the attending physician, personally viewed and interpreted this ECG.  Date: 08/24/2016 EKG Time: 8:08 Rate: 85 Rhythm: normal sinus rhythm QRS Axis: left axis deviation Intervals: Incomplete right bundle-branch block ST/T Wave abnormalities: 1 mm ST elevation in leads V1 and V2 with concave appearance. Narrative Interpretation: ST segments in anterior leads similar morphology to prior EKGs. No acute findings.  ED ECG REPORT I, Dionne Bucy, the attending physician, personally viewed and interpreted this ECG.  Date: 08/24/2016 EKG Time: 9:55 Rate: 83 Rhythm: normal sinus rhythm QRS Axis: normal Intervals: normal ST/T Wave abnormalities: 1 mm ST depression and T-wave inversion in lead 3, and new T-wave inversions in V4 V5 and V6. Narrative Interpretation: Concerning for acute  ischemia.  ____________________________________________  RADIOLOGY    ____________________________________________   PROCEDURES  Procedure(s) performed: No    Critical Care performed: Yes  CRITICAL CARE Performed by: Dionne Bucy   Total critical care time: 45 minutes  Critical care time was exclusive of separately billable procedures and treating other patients.  Critical care was necessary to treat or prevent imminent or life-threatening deterioration.  Critical care was time spent personally by me on the following activities: development of treatment plan with patient and/or surrogate as well as nursing, discussions with consultants, evaluation of patient's response to treatment, examination of patient, obtaining history from patient or surrogate, ordering and performing treatments and interventions, ordering and review of laboratory studies, ordering and review of radiographic studies, pulse oximetry and re-evaluation of patient's condition.  ____________________________________________   INITIAL IMPRESSION / ASSESSMENT AND PLAN / ED COURSE  Pertinent labs & imaging results that were available during my care of the patient were reviewed by me and considered in my medical decision making (see chart for details).  41 year old male extensive past medical history as noted, presents with right foot pain for 4 days associated with swelling and erythema and not associated trauma. States similar to prior episode of gout. She denies any chest pain or shortness of breath as noted in triage. Patient is hypertensive, but other vital signs are normal. Patient is well-appearing. Exam remarkable only for right foot findings as noted. EKG with anterior ST elevation, however morphology is similar to prior EKGs and patient has no chest pain or other concerning cardiac bone or symptoms at this time. Differential for right foot pain primarily concerning for gout, less likely trauma.  Based on limited area of the findings, there is no evidence of DVT. Very low suspicion for cellulitis. Plan: Labs including troponin, x-ray of right foot, chest x-ray, symptomatic treatment for gout, and reassess.    ----------------------------------------- 10:47 AM on 08/24/2016 -----------------------------------------  Patient reports improved pain in the foot. Repeat EKG shows new T-wave inversions in the lateral leads compared to prior. Patient remains with no chest pain or shortness of breath. He remains hypertensive. We'll give her antihypertensive and reassess, however anticipate likely will need admission due to dynamic EKG changes.  ----------------------------------------- 1:51 PM on 08/24/2016 -----------------------------------------  Patient's blood pressure again somewhat elevated. Will obtain repeat EKG and troponin. Case discussed with hospitalist and will admit. ____________________________________________   FINAL CLINICAL IMPRESSION(S) / ED DIAGNOSES  Final diagnoses:  Hypertension, unspecified type  EKG abnormalities      NEW MEDICATIONS STARTED DURING THIS  VISIT:  New Prescriptions   No medications on file     Note:  This document was prepared using Dragon voice recognition software and may include unintentional dictation errors.    Dionne Bucy, MD 08/24/16 [phone]

## 2016-08-24 NOTE — ED Notes (Signed)
Megan RN aware of patient placement in Room 13.

## 2016-08-24 NOTE — ED Notes (Signed)
Pharmacy tech at bedside at this time ?

## 2016-08-25 ENCOUNTER — Observation Stay
Admit: 2016-08-25 | Discharge: 2016-08-25 | Disposition: A | Discharge status: Home / Self Care | Payer: Self-pay | Attending: Internal Medicine | Admitting: Internal Medicine

## 2016-08-25 LAB — ECHOCARDIOGRAM COMPLETE
HEIGHTINCHES: 67 in
Weight: 5300.8 oz

## 2016-08-25 LAB — CBC
HEMATOCRIT: 42.7 % (ref 40.0–52.0)
Hemoglobin: 14.1 g/dL (ref 13.0–18.0)
MCH: 27.9 pg (ref 26.0–34.0)
MCHC: 33 g/dL (ref 32.0–36.0)
MCV: 84.5 fL (ref 80.0–100.0)
Platelets: 290 10*3/uL (ref 150–440)
RBC: 5.06 MIL/uL (ref 4.40–5.90)
RDW: 14.3 % (ref 11.5–14.5)
WBC: 7.8 10*3/uL (ref 3.8–10.6)

## 2016-08-25 LAB — GLUCOSE, CAPILLARY: GLUCOSE-CAPILLARY: 127 mg/dL — AB (ref 65–99)

## 2016-08-25 LAB — LIPID PANEL
Cholesterol: 122 mg/dL (ref 0–200)
HDL: 32 mg/dL — AB (ref >40)
LDL CALC: 73 mg/dL (ref 0–99)
Total CHOL/HDL Ratio: 3.8 RATIO
Triglycerides: 85 mg/dL (ref <150)
VLDL: 17 mg/dL (ref 0–40)

## 2016-08-25 LAB — BASIC METABOLIC PANEL
Anion gap: 8 (ref 5–15)
BUN: 16 mg/dL (ref 6–20)
CHLORIDE: 101 mmol/L (ref 101–111)
CO2: 27 mmol/L (ref 22–32)
Calcium: 8.6 mg/dL — ABNORMAL LOW (ref 8.9–10.3)
Creatinine, Ser: 1.28 mg/dL — ABNORMAL HIGH (ref 0.61–1.24)
GFR calc Af Amer: >60 mL/min (ref >60)
GFR calc non Af Amer: >60 mL/min (ref >60)
GLUCOSE: 97 mg/dL (ref 65–99)
POTASSIUM: 3.3 mmol/L — AB (ref 3.5–5.1)
Sodium: 136 mmol/L (ref 135–145)

## 2016-08-25 LAB — MISC LABCORP TEST (SEND OUT): Labcorp test code: 1453

## 2016-08-25 LAB — TROPONIN I

## 2016-08-25 LAB — HIV ANTIBODY (ROUTINE TESTING W REFLEX): HIV SCREEN 4TH GENERATION: NONREACTIVE

## 2016-08-25 MED ORDER — ACETAMINOPHEN 325 MG PO TABS: 650.0000 mg | ORAL_TABLET | Freq: Four times a day (QID) | ORAL | 0 refills | Status: DC | PRN

## 2016-08-25 MED ORDER — PREDNISONE 50 MG PO TABS: 50.0000 mg | ORAL_TABLET | Freq: Every day | ORAL | 0 refills | Status: AC

## 2016-08-25 MED ORDER — HYDROCODONE-ACETAMINOPHEN 5-325 MG PO TABS: 1.0000 | ORAL_TABLET | Freq: Four times a day (QID) | ORAL | 0 refills | Status: DC | PRN

## 2016-08-25 NOTE — Care Management (Signed)
No discharge needs identified by members of the care team 

## 2016-08-25 NOTE — Progress Notes (Signed)
Discharge instructions explained to pt/ verbalized an understanding/ iv and tele removed/ RX given to pt/ transported off unit via wheelchair.  

## 2016-08-25 NOTE — Progress Notes (Signed)
*  PRELIMINARY RESULTS* Echocardiogram 2D Echocardiogram has been performed.  Noah Rodriguez 08/25/2016, 9:33 AM
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
## 2016-08-25 NOTE — Plan of Care (Signed)
Problem: Pain Managment: Goal: General experience of comfort will improve Outcome: Not Progressing Pt complaining of right foot pain related to the gout, treated for pain twice this shift with 1 tablet of norco which has worked well for him.

## 2016-08-25 NOTE — Discharge Summary (Signed)
[facility] - [place] at [facility]   PATIENT NAME: Noah Rodriguez    MR#:  [id number]  DATE OF BIRTH:  [birth year]  DATE OF ADMISSION:  08/24/2016 ADMITTING PHYSICIAN: Katharina Caper, MD  DATE OF DISCHARGE: 08/25/2016  PRIMARY CARE PHYSICIAN: Patient, No Pcp Per    ADMISSION DIAGNOSIS:  EKG abnormalities [R94.31] Hypertension, unspecified type [I10]  DISCHARGE DIAGNOSIS:  Active Problems:   Acute gout   Malignant essential hypertension   CKD (chronic kidney disease), stage II   Obesity   SECONDARY DIAGNOSIS:   Past Medical History:  Diagnosis Date  . Acute kidney injury (HCC)   . Asthma   . Chronic combined systolic and diastolic CHF (congestive heart failure) (HCC)    EF < 25% on Cath (09/17/2014)  . Gout   . Gout of big toe 09/16/2014  . H/O medication noncompliance   . Hypertensive heart disease   . Hypokalemia   . MI (myocardial infarction) [facility])    Patient denies having had a MI  . NICM (nonischemic cardiomyopathy) (HCC)    a. cardiac cath 09/2014 with normal coronary arteries, EF 20-25% with severe global HK; b. echo 08/2014: EF 30-35%, false tendon in LV aepx of no clinical sig, diffuse HK, GR1DD, aortic root 40 mm, trivial MR, trivial pericardial effusion posterior   . Obesity, Class III, BMI 40-49.9 (morbid obesity) (HCC)   . Sleep apnea    a. sleep study 05/2010; b. noncompliant with CPAP  . Ventral hernia    a. incarcerated omentum 06/10/2015    HOSPITAL COURSE:   #1. Acute gout exacerbation in the right foot,   on prednisone, opiates, getting physical therapist involved for recommendations   Pt felt better with his pain, able to walk without much trouble. #2. Malignant essential hypertension due to noncompliance, resume blood pressure medications, follow blood pressure readings, stable now.   He said, he stopped some of the meds on his own, thinking they are giving him foot pain.   Now agrees to continue taking on time. #3. EKG  changes, possibly related to malignant essential hypertension, reciprocal changes continue Coreg, and aspirin, nitroglycerin topically, continue Entresto, follow cardiac enzymes 3- remained negative, no chest pain.  #4. CK D stage II, follow with therapy- stable. #5. Obesity, patient is noncompliant with CPAP, 94% on room air.  Hemoglobin A1c- 6.4, TSH 0.7, Lipid panel in normal range except slight low HDL. #6. Hyperglycemia, A1c is 6.4- dietary advise given.   DISCHARGE CONDITIONS:   Stable.  CONSULTS OBTAINED:    DRUG ALLERGIES:  No Known Allergies  DISCHARGE MEDICATIONS:   Current Discharge Medication List    START taking these medications   Details  acetaminophen (TYLENOL) 325 MG tablet Take 2 tablets (650 mg total) by mouth every 6 (six) hours as needed for mild pain (or Fever >/= 101). Qty: 20 tablet, Refills: 0    HYDROcodone-acetaminophen (NORCO/VICODIN) 5-325 MG tablet Take 1 tablet by mouth every 6 (six) hours as needed for moderate pain. Qty: 20 tablet, Refills: 0    predniSONE (DELTASONE) 50 MG tablet Take 1 tablet (50 mg total) by mouth daily with breakfast. Qty: 3 tablet, Refills: 0      CONTINUE these medications which have NOT CHANGED   Details  carvedilol (COREG) 25 MG tablet Take 1 tablet (25 mg total) by mouth 2 (two) times daily with a meal. Qty: 60 tablet, Refills: 5    furosemide (LASIX) 20 MG tablet Take 2 tablets (40 mg total) by  mouth daily. Qty: 30 tablet, Refills: 3    sacubitril-valsartan (ENTRESTO) 49-51 MG Take 1 tablet by mouth 2 (two) times daily. Qty: 60 tablet, Refills: 5    albuterol (PROVENTIL HFA;VENTOLIN HFA) 108 (90 BASE) MCG/ACT inhaler Inhale 2 puffs into the lungs every 6 (six) hours as needed for wheezing or shortness of breath. Qty: 1 Inhaler, Refills: 11   Associated Diagnoses: Mild persistent asthma, uncomplicated    aspirin EC 81 MG tablet Take 1 tablet (81 mg total) by mouth daily. Qty: 30 tablet, Refills: 2          DISCHARGE INSTRUCTIONS:    Follow with PMD in 1-2 weeks.  If you experience worsening of your admission symptoms, develop shortness of breath, life threatening emergency, suicidal or homicidal thoughts you must seek medical attention immediately by calling 911 or calling your MD immediately  if symptoms less severe.  You Must read complete instructions/literature along with all the possible adverse reactions/side effects for all the Medicines you take and that have been prescribed to you. Take any new Medicines after you have completely understood and accept all the possible adverse reactions/side effects.   Please note  You were cared for by a hospitalist during your hospital stay. If you have any questions about your discharge medications or the care you received while you were in the hospital after you are discharged, you can call the unit and asked to speak with the hospitalist on call if the hospitalist that took care of you is not available. Once you are discharged, your primary care physician will handle any further medical issues. Please note that NO REFILLS for any discharge medications will be authorized once you are discharged, as it is imperative that you return to your primary care physician (or establish a relationship with a primary care physician if you do not have one) for your aftercare needs so that they can reassess your need for medications and monitor your lab values.    Today   CHIEF COMPLAINT:   Chief Complaint  Patient presents with  . Leg Swelling    HISTORY OF PRESENT ILLNESS:  Noah Rodriguez  is a 41 y.o. male with a known history of COPD, sleep apnea, noncompliant with CPAP, systolic CHF, cardiomyopathy with ejection fraction of 35-40 % on echocardiogram in February 2018, essential hypertension, normal coronary arteries on cardiac catheterization in August 2016, who presents to the hospital with complaints of right foot pain. According to the  patient, he has been having right foot pain. 3. Toe pain for the past 3 or 4 days, pain, discomfort, described as achy worsening whenever he walks. He stopped his heart medication since he thought that right foot pain is medication related. On arrival to emergency room, he was noted to have markedly elevated blood pressure, blood pressure ranging between 180s to 200s, according to emergency room physician. He was given labetalol intravenously with blood pressure improvement to 160/90. He was noted to have EKG changes, T depressions in anterolateral and inferior leads, and although patient denied shortness of breath or chest pains, hospitalist services were contacted for admission.    VITAL SIGNS:  Blood pressure 139/80, pulse 72, temperature 97.8 F (36.6 C), temperature source Oral, resp. rate 20, height 5\' 7"  (1.702 m), weight (!) 150.3 kg (331 lb 4.8 oz), SpO2 94 %.  I/O:   Intake/Output Summary (Last 24 hours) at 08/25/16 1041 Last data filed at 08/25/16 0952  Gross per 24 hour  Intake  363 ml  Output             1795 ml  Net            -1432 ml    PHYSICAL EXAMINATION:  GENERAL:  41 y.o.-year-old patient lying in the bed with no acute distress.  EYES: Pupils equal, round, reactive to light and accommodation. No scleral icterus. Extraocular muscles intact.  HEENT: Head atraumatic, normocephalic. Oropharynx and nasopharynx clear.  NECK:  Supple, no jugular venous distention. No thyroid enlargement, no tenderness.  LUNGS: Normal breath sounds bilaterally, no wheezing, rales,rhonchi or crepitation. No use of accessory muscles of respiration.  CARDIOVASCULAR: S1, S2 normal. No murmurs, rubs, or gallops.  ABDOMEN: Soft, non-tender, non-distended. Bowel sounds present. No organomegaly or mass.  EXTREMITIES: No pedal edema, cyanosis, or clubbing. Right foot is swollen and have some tenderness. NEUROLOGIC: Cranial nerves II through XII are intact. Muscle strength 5/5 in all  extremities. Sensation intact. Gait not checked.  PSYCHIATRIC: The patient is alert and oriented x 3.  SKIN: No obvious rash, lesion, or ulcer.   DATA REVIEW:   CBC  Recent Labs Lab 08/25/16 0347  WBC 7.8  HGB 14.1  HCT 42.7  PLT 290    Chemistries   Recent Labs Lab 08/25/16 0347  NA 136  K 3.3*  CL 101  CO2 27  GLUCOSE 97  BUN 16  CREATININE 1.28*  CALCIUM 8.6*    Cardiac Enzymes  Recent Labs Lab 08/25/16 0347  TROPONINI <0.03    Microbiology Results  Results for orders placed or performed during the hospital encounter of 09/09/14  MRSA PCR Screening     Status: None   Collection Time: 09/09/14  7:10 PM  Result Value Ref Range Status   MRSA by PCR NEGATIVE NEGATIVE Final    Comment:        The GeneXpert MRSA Assay (FDA approved for NASAL specimens only), is one component of a comprehensive MRSA colonization surveillance program. It is not intended to diagnose MRSA infection nor to guide or monitor treatment for MRSA infections.     RADIOLOGY:  Dg Chest 2 View  Result Date: 08/24/2016 CLINICAL DATA:  Shortness of breath for 2 days.  No chest pain. EXAM: CHEST  2 VIEW COMPARISON:  06/10/2015 FINDINGS: There is mild bilateral interstitial prominence. There is no focal parenchymal opacity. There is no pleural effusion or pneumothorax. There is stable cardiomegaly. The osseous structures are unremarkable. IMPRESSION: No active cardiopulmonary disease. Electronically Signed   By: Elige Ko   On: 08/24/2016 09:08   Dg Foot 2 Views Right  Result Date: 08/24/2016 CLINICAL DATA:  Diffuse right foot pain and swelling for 2-3 days. No known injury. EXAM: RIGHT FOOT - 2 VIEW COMPARISON:  None. FINDINGS: Soft tissues of the foot appear swollen. No bony or joint abnormality is identified. IMPRESSION: Soft tissue swelling without bony or joint abnormality. Electronically Signed   By: Drusilla Kanner M.D.   On: 08/24/2016 09:13    EKG:   Orders placed or  performed during the hospital encounter of 08/24/16  . ED EKG within 10 minutes  . ED EKG within 10 minutes  . Repeat EKG  . Repeat EKG  . Repeat EKG  . Repeat EKG      Management plans discussed with the patient, family and they are in agreement.  CODE STATUS:     Code Status Orders        Start     Ordered  08/24/16 1532  Full code  Continuous     08/24/16 1532    Code Status History    Date Active Date Inactive Code Status Order ID Comments User Context   02/13/2016 11:39 AM 02/15/2016  6:07 PM Full Code [id number]  Auburn Bilberry, MD Inpatient   06/10/2015 10:38 AM 06/12/2015  4:22 PM Full Code [id number]  Lattie Haw, MD ED   09/17/2014 12:37 PM 09/20/2014  6:09 PM Full Code [id number]  Lennette Bihari, MD Inpatient   09/09/2014  4:53 PM 09/17/2014 12:37 PM Full Code [id number]  Manson Passey, PA Inpatient      TOTAL TIME TAKING CARE OF THIS PATIENT: 35 minutes.    Altamese Dilling M.D on 08/25/2016 at 10:41 AM  Between 7am to 6pm - Pager - [phone]  After 6pm go to [email or web address] - password [email or web address]  [facility]  Office  [phone]  CC: Primary care physician; Patient, No Pcp Per   Note: This dictation was prepared with Dragon dictation along with smaller phrase technology. Any transcriptional errors that result from this process are unintentional.

## 2016-08-25 NOTE — Progress Notes (Signed)
PT Cancellation Note  Patient Details Name: [name] MRN: [id number] DOB: 1975/04/10   Cancelled Treatment:    Eval attempted but pt undergoing testing at this time. PT will reattempt at later time/date, as pt is available.  Zerita Boers, PT,DPT 08/25/16 8:53 AM    Jaquavion Mccannon L 08/25/2016, 8:53 AM

## 2016-08-27 ENCOUNTER — Other Ambulatory Visit: Payer: Self-pay | Admitting: Family

## 2016-08-27 MED ORDER — FUROSEMIDE 40 MG PO TABS: 40.0000 mg | ORAL_TABLET | Freq: Every day | ORAL | 5 refills | Status: DC

## 2016-10-27 ENCOUNTER — Ambulatory Visit: Payer: Self-pay | Attending: Family | Admitting: Family

## 2016-10-27 ENCOUNTER — Encounter: Payer: Self-pay | Admitting: Family

## 2016-10-27 VITALS — BP 173/94 | HR 70 | Resp 20 | Ht 67.0 in | Wt 343.4 lb

## 2016-10-27 DIAGNOSIS — M109 Gout, unspecified: Secondary | ICD-10-CM | POA: Insufficient documentation

## 2016-10-27 DIAGNOSIS — Z6841 Body Mass Index (BMI) 40.0 and over, adult: Secondary | ICD-10-CM | POA: Insufficient documentation

## 2016-10-27 DIAGNOSIS — Z79899 Other long term (current) drug therapy: Secondary | ICD-10-CM | POA: Insufficient documentation

## 2016-10-27 DIAGNOSIS — I110 Hypertensive heart disease with heart failure: Secondary | ICD-10-CM | POA: Insufficient documentation

## 2016-10-27 DIAGNOSIS — I5022 Chronic systolic (congestive) heart failure: Secondary | ICD-10-CM | POA: Insufficient documentation

## 2016-10-27 DIAGNOSIS — G4733 Obstructive sleep apnea (adult) (pediatric): Secondary | ICD-10-CM | POA: Insufficient documentation

## 2016-10-27 DIAGNOSIS — J45909 Unspecified asthma, uncomplicated: Secondary | ICD-10-CM | POA: Insufficient documentation

## 2016-10-27 DIAGNOSIS — E876 Hypokalemia: Secondary | ICD-10-CM | POA: Insufficient documentation

## 2016-10-27 DIAGNOSIS — I252 Old myocardial infarction: Secondary | ICD-10-CM | POA: Insufficient documentation

## 2016-10-27 DIAGNOSIS — I10 Essential (primary) hypertension: Secondary | ICD-10-CM

## 2016-10-27 MED ORDER — POTASSIUM CHLORIDE CRYS ER 20 MEQ PO TBCR: 20.0000 meq | EXTENDED_RELEASE_TABLET | Freq: Every day | ORAL | 3 refills | Status: DC

## 2016-10-27 NOTE — Patient Instructions (Addendum)
Resume weighing daily and call for an overnight weight gain of > 2 pounds or a weekly weight gain of >5 pounds.  Check blood pressure daily and write it down. Bring with you to your next appointment.  Start working on wearing your CPAP daily even if just for a few minutes.  Begin walking daily for exercise.

## 2016-10-27 NOTE — Progress Notes (Signed)
Patient ID: Noah Rodriguez, male    DOB: October 21, 1975, 41 y.o.   MRN: [id number]  HPI  Mr Spofford is a 41 y/o male with a history of obstructive sleep apnea, obesity, hypokalemia, gout, asthma and chronic heart failure.   Echo done 08/25/16 reviewed and shows an EF of 60-65%. Echo done 02/14/16 and showed an EF of 35-40% along with mild MR/TR. EF has improved slightly from August 2016. Last cardiac catheterization was done 09/17/14 and showed an EF of 20-25% at that time. No CAD present.   Admitted 08/24/16 due to acute gout along with malignant HTN. Treated with prednisone and opiates. Medications were restarted and he was discharged the next day. Was admitted 02/13/16 with HF exacerbation. Was initially treated with IV diuretics and then transitioned to oral diuretics. Cardiology consult was done. Discharged home after 2 days. Was in the ED on 07/31/15 for uvulitis. He was treated and released home.   He presents today for a follow-up visit with a chief complaint of mild shortness of breath upon moderate exertion. He describes this as chronic in nature having been present for several years with varying levels of severity. He has associated edema, palpitations and weight gain. He denies any fatigue, chest pain, cough or dizziness.   Past Medical History:  Diagnosis Date  . Acute kidney injury (HCC)   . Asthma   . Chronic combined systolic and diastolic CHF (congestive heart failure) (HCC)    EF < 25% on Cath (09/17/2014)  . Gout   . Gout of big toe 09/16/2014  . H/O medication noncompliance   . Hypertensive heart disease   . Hypokalemia   . MI (myocardial infarction) [facility])    Patient denies having had a MI  . NICM (nonischemic cardiomyopathy) (HCC)    a. cardiac cath 09/2014 with normal coronary arteries, EF 20-25% with severe global HK; b. echo 08/2014: EF 30-35%, false tendon in LV aepx of no clinical sig, diffuse HK, GR1DD, aortic root 40 mm, trivial MR, trivial pericardial effusion posterior    . Obesity, Class III, BMI 40-49.9 (morbid obesity) (HCC)   . Sleep apnea    a. sleep study 05/2010; b. noncompliant with CPAP  . Ventral hernia    a. incarcerated omentum 06/10/2015   Past Surgical History:  Procedure Laterality Date  . CARDIAC CATHETERIZATION N/A 09/17/2014   Procedure: Right/Left Heart Cath and Coronary Angiography;  Surgeon: Lennette Bihari, MD;  Location: [facility] INVASIVE CV LAB;  Service: Cardiovascular;  Laterality: N/A;  . VENTRAL HERNIA REPAIR N/A 06/11/2015   Procedure: HERNIA REPAIR VENTRAL ADULT;  Surgeon: Lattie Haw, MD;  Location: [facility] ORS;  Service: General;  Laterality: N/A;   Family History  Problem Relation Age of Onset  . HIV Mother   . Hypertension Mother   . Hypertension Other   . Hypertension Maternal Grandmother   . Hypertension Paternal Grandmother   . Alcohol abuse Neg Hx   . Cancer Neg Hx   . Early death Neg Hx   . Heart disease Neg Hx   . Hyperlipidemia Neg Hx   . Kidney disease Neg Hx   . Stroke Neg Hx   . Heart attack Neg Hx    Social History  Substance Use Topics  . Smoking status: Never Smoker  . Smokeless tobacco: Never Used  . Alcohol use No   No Known Allergies  Prior to Admission medications   Medication Sig Start Date End Date Taking? Authorizing Provider  acetaminophen (TYLENOL) 325 MG  tablet Take 2 tablets (650 mg total) by mouth every 6 (six) hours as needed for mild pain (or Fever >/= 101). 08/25/16  Yes Altamese Dilling, MD  carvedilol (COREG) 25 MG tablet Take 1 tablet (25 mg total) by mouth 2 (two) times daily with a meal. 07/08/16  Yes Clarisa Kindred A, FNP  furosemide (LASIX) 40 MG tablet Take 1 tablet (40 mg total) by mouth daily. 08/27/16  Yes Kayin Osment A, FNP  sacubitril-valsartan (ENTRESTO) 49-51 MG Take 1 tablet by mouth 2 (two) times daily. 07/07/16  Yes [place] Steidle, Inetta Fermo A, FNP  albuterol (PROVENTIL HFA;VENTOLIN HFA) 108 (90 BASE) MCG/ACT inhaler Inhale 2 puffs into the lungs every 6 (six) hours as needed for  wheezing or shortness of breath. Patient not taking: Reported on 08/24/2016 01/16/14   Etta Grandchild, MD  potassium chloride SA (K-DUR,KLOR-CON) 20 MEQ tablet Take 1 tablet (20 mEq total) by mouth daily. 10/27/16   Delma Freeze, FNP   Review of Systems  Constitutional: Negative for appetite change and fatigue.  HENT: Negative for congestion, postnasal drip and sore throat.   Eyes: Negative.   Respiratory: Positive for shortness of breath (with walking long distances). Negative for cough and chest tightness.   Cardiovascular: Positive for palpitations and leg swelling. Negative for chest pain.  Gastrointestinal: Negative for abdominal distention and abdominal pain.  Endocrine: Negative.   Genitourinary: Negative.        Decreased libido   Musculoskeletal: Positive for arthralgias (feet sore after sitting for long periods). Negative for back pain and neck pain.  Skin: Negative.   Allergic/Immunologic: Negative.   Neurological: Negative for dizziness and light-headedness.  Hematological: Negative for adenopathy. Does not bruise/bleed easily.  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 Negative for dysphoric mood and suicidal ideas. The patient is not nervous/anxious.    Vitals:   10/27/16 0857  BP: (!) 173/94  Pulse: 70  Resp: 20  SpO2: 99%  Weight: (!) 343 lb 6 oz (155.8 kg)  Height: 5\' 7"  (1.702 m)   Wt Readings from Last 3 Encounters:  10/27/16 (!) 343 lb 6 oz (155.8 kg)  08/25/16 (!) 331 lb 4.8 oz (150.3 kg)  07/29/16 (!) 340 lb 6 oz (154.4 kg)    Lab Results  Component Value Date   CREATININE 1.28 (H) 08/25/2016   CREATININE 1.39 (H) 08/24/2016   CREATININE 1.56 (H) 07/29/2016    Physical Exam  Constitutional: He is oriented to person, place, and time. He appears well-developed and well-nourished.  HENT:  Head: Normocephalic and atraumatic.  Neck: Normal range of motion. Neck supple. No JVD present.  Cardiovascular: Normal rate  and regular rhythm.   Pulmonary/Chest: Effort normal. He has no wheezes. He has no rales.  Abdominal: Soft. He exhibits no distension. There is no tenderness.  Musculoskeletal: He exhibits edema (trace edema in bilateral lower legs). He exhibits no tenderness.  Neurological: He is alert and oriented to person, place, and time.  Skin: Skin is warm and dry.  Psychiatric: He has a normal mood and affect. His behavior is normal. Thought content normal.  Nursing note and vitals reviewed.   Assessment & Plan:  1: Chronic heart failure with reduced ejection fraction- - NYHA class II - euvolemic - not weighing daily. Weight up 3 pounds since he was last here. Emphasized the importance of weighing daily and calling for an overnight weight gain of >2 pounds or a weekly weight gain of >5 pounds - not adding salt to his  food and is reading food labels. Discussed the importance of closely following a 2000mg  sodium diet. He says that he's rarely eating fried chicken now and eats grilled chicken instead. Occasionally does go to [employer]. 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  - he has not tried wearing the CPAP even for short periods. Discussed the importance of trying to wear it so that he can get used to it.   4: Hypokalemia- - last potassium slightly low - will add potassium 20meq daily - will check a BMP at his next visit  Patient did not bring his medications nor a list. Each medication was verbally reviewed with the patient and he was  encouraged to bring the bottles to every visit to confirm accuracy of list.  Return in 1 month or sooner for any questions/problems before then.

## 2016-11-25 ENCOUNTER — Other Ambulatory Visit: Payer: Self-pay

## 2016-11-25 ENCOUNTER — Encounter: Payer: Self-pay | Admitting: Family

## 2016-11-25 ENCOUNTER — Ambulatory Visit: Payer: Self-pay | Attending: Family | Admitting: Family

## 2016-11-25 VITALS — BP 146/86 | HR 87 | Resp 18 | Ht 67.0 in | Wt 353.5 lb

## 2016-11-25 DIAGNOSIS — I110 Hypertensive heart disease with heart failure: Secondary | ICD-10-CM | POA: Insufficient documentation

## 2016-11-25 DIAGNOSIS — I428 Other cardiomyopathies: Secondary | ICD-10-CM | POA: Insufficient documentation

## 2016-11-25 DIAGNOSIS — E876 Hypokalemia: Secondary | ICD-10-CM | POA: Insufficient documentation

## 2016-11-25 DIAGNOSIS — N529 Male erectile dysfunction, unspecified: Secondary | ICD-10-CM | POA: Insufficient documentation

## 2016-11-25 DIAGNOSIS — I10 Essential (primary) hypertension: Secondary | ICD-10-CM

## 2016-11-25 DIAGNOSIS — Z6841 Body Mass Index (BMI) 40.0 and over, adult: Secondary | ICD-10-CM | POA: Insufficient documentation

## 2016-11-25 DIAGNOSIS — Z79899 Other long term (current) drug therapy: Secondary | ICD-10-CM | POA: Insufficient documentation

## 2016-11-25 DIAGNOSIS — M109 Gout, unspecified: Secondary | ICD-10-CM | POA: Insufficient documentation

## 2016-11-25 DIAGNOSIS — G4733 Obstructive sleep apnea (adult) (pediatric): Secondary | ICD-10-CM | POA: Insufficient documentation

## 2016-11-25 DIAGNOSIS — I5022 Chronic systolic (congestive) heart failure: Secondary | ICD-10-CM | POA: Insufficient documentation

## 2016-11-25 DIAGNOSIS — J45909 Unspecified asthma, uncomplicated: Secondary | ICD-10-CM | POA: Insufficient documentation

## 2016-11-25 DIAGNOSIS — Z8249 Family history of ischemic heart disease and other diseases of the circulatory system: Secondary | ICD-10-CM | POA: Insufficient documentation

## 2016-11-25 LAB — BASIC METABOLIC PANEL
ANION GAP: 9 (ref 5–15)
BUN: 19 mg/dL (ref 6–20)
CALCIUM: 8.4 mg/dL — AB (ref 8.9–10.3)
CO2: 26 mmol/L (ref 22–32)
Chloride: 102 mmol/L (ref 101–111)
Creatinine, Ser: 1.28 mg/dL — ABNORMAL HIGH (ref 0.61–1.24)
GFR calc Af Amer: >60 mL/min (ref >60)
GFR calc non Af Amer: >60 mL/min (ref >60)
GLUCOSE: 133 mg/dL — AB (ref 65–99)
POTASSIUM: 3.7 mmol/L (ref 3.5–5.1)
Sodium: 137 mmol/L (ref 135–145)

## 2016-11-25 NOTE — Progress Notes (Signed)
Patient ID: Noah Rodriguez, male    DOB: 07-Jul-1975, 41 y.o.   MRN: [id number]  HPI  Noah Rodriguez is a 41 y/o male with a history of obstructive sleep apnea, obesity, hypokalemia, gout, asthma and chronic heart failure.   Echo done 08/25/16 reviewed and shows an EF of 60-65%. Echo done 02/14/16 and showed an EF of 35-40% along with mild Noah/TR. EF has improved slightly from August 2016. Last cardiac catheterization was done 09/17/14 and showed an EF of 20-25% at that time. No CAD present.   Admitted 08/24/16 due to acute gout along with malignant HTN. Treated with prednisone and opiates. Medications were restarted and he was discharged the next day. Was admitted 02/13/16 with HF exacerbation. Was initially treated with IV diuretics and then transitioned to oral diuretics. Cardiology consult was done. Discharged home after 2 days. Was in the ED on 07/31/15 for uvulitis. He was treated and released home.   He presents today for a follow-up visit with a chief complaint of moderate fatigue upon moderate exertion. He describes this as chronic in nature having been present for several years with varying levels of severity. 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 He has associated shortness of breath, minimal edema, palpitations, light-headedness at times, weight gain and difficulty sleeping. He denies any chest pain or cough.   Past Medical History:  Diagnosis Date  . Acute kidney injury (HCC)   . Asthma   . Chronic combined systolic and diastolic CHF (congestive heart failure) (HCC)    EF < 25% on Cath (09/17/2014)  . Gout   . Gout of big toe 09/16/2014  . H/O medication noncompliance   . Hypertensive heart disease   . Hypokalemia   . MI (myocardial infarction) [facility])    Patient denies having had a MI  . NICM (nonischemic cardiomyopathy) (HCC)    a. cardiac cath 09/2014 with normal coronary arteries, EF 20-25% with severe global HK; b. echo 08/2014: EF 30-35%, false tendon in LV  aepx of no clinical sig, diffuse HK, GR1DD, aortic root 40 mm, trivial Noah, trivial pericardial effusion posterior   . Obesity, Class III, BMI 40-49.9 (morbid obesity) (HCC)   . Sleep apnea    a. sleep study 05/2010; b. noncompliant with CPAP  . Ventral hernia    a. incarcerated omentum 06/10/2015   Past Surgical History:  Procedure Laterality Date  . CARDIAC CATHETERIZATION N/A 09/17/2014   Procedure: Right/Left Heart Cath and Coronary Angiography;  Surgeon: Lennette Bihari, MD;  Location: [facility] INVASIVE CV LAB;  Service: Cardiovascular;  Laterality: N/A;  . VENTRAL HERNIA REPAIR N/A 06/11/2015   Procedure: HERNIA REPAIR VENTRAL ADULT;  Surgeon: Lattie Haw, MD;  Location: [facility] ORS;  Service: General;  Laterality: N/A;   Family History  Problem Relation Age of Onset  . HIV Mother   . Hypertension Mother   . Hypertension Other   . Hypertension Maternal Grandmother   . Hypertension Paternal Grandmother   . Alcohol abuse Neg Hx   . Cancer Neg Hx   . Early death Neg Hx   . Heart disease Neg Hx   . Hyperlipidemia Neg Hx   . Kidney disease Neg Hx   . Stroke Neg Hx   . Heart attack Neg Hx    Social History   Tobacco Use  . Smoking status: Never Smoker  . Smokeless tobacco: Never Used  Substance Use Topics  . Alcohol use: No   No Known Allergies  Prior to Admission medications   Medication Sig Start Date End Date Taking? Authorizing Provider  acetaminophen (TYLENOL) 325 MG tablet Take 2 tablets (650 mg total) by mouth every 6 (six) hours as needed for mild pain (or Fever >/= 101). 08/25/16  Yes Altamese DillingVachhani, Vaibhavkumar, MD  albuterol (PROVENTIL HFA;VENTOLIN HFA) 108 (90 BASE) MCG/ACT inhaler Inhale 2 puffs into the lungs every 6 (six) hours as needed for wheezing or shortness of breath. 01/16/14  Yes Etta GrandchildJones, Thomas L, MD  carvedilol (COREG) 25 MG tablet Take 1 tablet (25 mg total) by mouth 2 (two) times daily with a meal. 07/08/16  Yes Clarisa KindredHackney, Tina A, FNP  furosemide (LASIX) 40 MG tablet  Take 1 tablet (40 mg total) by mouth daily. 08/27/16  Yes Hackney, Inetta Fermoina A, FNP  potassium chloride SA (K-DUR,KLOR-CON) 20 MEQ tablet Take 1 tablet (20 mEq total) by mouth daily. 10/27/16  Yes Hackney, Tina A, FNP  sacubitril-valsartan (ENTRESTO) 49-51 MG Take 1 tablet by mouth 2 (two) times daily. 07/07/16  Yes Delma FreezeHackney, Tina A, FNP    Review of Systems  Constitutional: Positive for fatigue. Negative for appetite change.  HENT: Negative for congestion, postnasal drip and sore throat.   Eyes: Negative.   Respiratory: Positive for shortness of breath (with walking long distances). Negative for cough and chest tightness.   Cardiovascular: Positive for palpitations and leg swelling. Negative for chest pain.  Gastrointestinal: Negative for abdominal distention and abdominal pain.  Endocrine: Negative.   Genitourinary: Negative.        Decreased libido   Musculoskeletal: Positive for arthralgias (hands, elbow, feet). Negative for back pain and neck pain.  Skin: Negative.   Allergic/Immunologic: Negative.   Neurological: Positive for light-headedness. Negative for dizziness.  Hematological: Negative for adenopathy. Does not bruise/bleed easily.  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 Negative for dysphoric mood and suicidal ideas. The patient is not nervous/anxious.    Vitals:   11/25/16 0826  BP: (!) 146/86  Pulse: 87  Resp: 18  SpO2: 96%  Weight: (!) 353 lb 8 oz (160.3 kg)  Height: 5\' 7"  (1.702 m)   Wt Readings from Last 3 Encounters:  11/25/16 (!) 353 lb 8 oz (160.3 kg)  10/27/16 (!) 343 lb 6 oz (155.8 kg)  08/25/16 (!) 331 lb 4.8 oz (150.3 kg)    Lab Results  Component Value Date   CREATININE 1.28 (H) 08/25/2016   CREATININE 1.39 (H) 08/24/2016   CREATININE 1.56 (H) 07/29/2016    Physical Exam  Constitutional: He is oriented to person, place, and time. He appears well-developed and well-nourished.  HENT:  Head: Normocephalic and  atraumatic.  Neck: Normal range of motion. Neck supple. No JVD present.  Cardiovascular: Normal rate and regular rhythm.  Pulmonary/Chest: Effort normal. He has no wheezes. He has no rales.  Abdominal: Soft. He exhibits no distension. There is no tenderness.  Musculoskeletal: He exhibits edema (trace edema in bilateral lower legs). He exhibits no tenderness.  Neurological: He is alert and oriented to person, place, and time.  Skin: Skin is warm and dry.  Psychiatric: He has a normal mood and affect. His behavior is normal. Thought content normal.  Nursing note and vitals reviewed.   Assessment & Plan:  1: Chronic heart failure with reduced ejection fraction- - NYHA class II - euvolemic - weighing daily. Weight up 10 pounds since he was last here. Emphasized the importance of weighing daily and calling for an overnight weight gain of >2 pounds or a weekly  weight gain of >5 pounds - discussed the importance of increasing his activity when he's not at work and that he needs to walk fast enough to get his heart rate up - says that he walks around a lot at work but he says that he doesn't walk very fast and doesn't get his heartrate up much while walking - weight continues to gradually climb and he's gained 24.8 pounds since February 2018 - admits that his eating habits aren't the best as he tends to eat 2 strips of bacon, eggs and potatoes after getting off of work in the morning and then goes straight to bed; also eats a lot of bananas which have a lot of carbs in them - discussed referring to a dietician but he currently is without insurance and is unable to pay out of pocket for this - not adding salt to his food and is reading food labels. Discussed the importance of closely following a 2000mg  sodium diet.  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  - he has not tried wearing the CPAP even for short periods.  - discussed the importance of trying to wear his CPAP even for short blocks of time  4: Hypokalemia- - started potassium last visit - BMP drawn today  5: Erectile dysfunction- - patient says that his ability to get/maintain an erection has declined "quite a bit" - discussed that sometimes the carvedilol can contribute to erectile dysfunction - says that he makes too much money for [employer] and the contact information for [facility] was given to him so that he can call them to get established and discuss this further  Medication bottles were reviewed.  Return in 3 months or sooner for any questions/problems before then.

## 2016-11-25 NOTE — Patient Instructions (Addendum)
Continue weighing daily and call for an overnight weight gain of > 2 pounds or a weekly weight gain of >5 pounds.  Carvedilol could be decreasing your sex drive.   Contact [facility] to get established with primary care. [phone]

## 2017-02-23 NOTE — Progress Notes (Signed)
Patient ID: Noah Rodriguez, male    DOB: 09/09/1975, 42 y.o.   MRN: [id number]  HPI  Mr Winker is a 42 y/o male with a history of obstructive sleep apnea, obesity, hypokalemia, gout, asthma and chronic heart failure.   Echo done 08/25/16 reviewed and shows an EF of 60-65%. Echo done 02/14/16 and showed an EF of 35-40% along with mild MR/TR. EF has improved slightly from August 2016. Last cardiac catheterization was done 09/17/14 and showed an EF of 20-25% at that time. No CAD present.   Admitted 08/24/16 due to acute gout along with malignant HTN. Treated with prednisone and opiates. Medications were restarted and he was discharged the next day.   He presents today for a follow-up visit with a chief complaint of moderate fatigue upon minimal exertion. This has been present for several years although he feels like it is worse recently. Continues to work night shift. He has associated cough, subjective fever, shortness of breath, wheezing, light-headedness, edema, palpitations and difficulty sleeping. He denies any chest pain, abdominal distention or weight gain. Continues to work night shift. Has been taking OTC medications for head cold  Past Medical History:  Diagnosis Date  . Acute kidney injury (HCC)   . Asthma   . Chronic combined systolic and diastolic CHF (congestive heart failure) (HCC)    EF < 25% on Cath (09/17/2014)  . Gout   . Gout of big toe 09/16/2014  . H/O medication noncompliance   . Hypertensive heart disease   . Hypokalemia   . MI (myocardial infarction) [facility])    Patient denies having had a MI  . NICM (nonischemic cardiomyopathy) (HCC)    a. cardiac cath 09/2014 with normal coronary arteries, EF 20-25% with severe global HK; b. echo 08/2014: EF 30-35%, false tendon in LV aepx of no clinical sig, diffuse HK, GR1DD, aortic root 40 mm, trivial MR, trivial pericardial effusion posterior   . Obesity, Class III, BMI 40-49.9 (morbid obesity) (HCC)   . Sleep apnea    a. sleep study  05/2010; b. noncompliant with CPAP  . Ventral hernia    a. incarcerated omentum 06/10/2015   Past Surgical History:  Procedure Laterality Date  . CARDIAC CATHETERIZATION N/A 09/17/2014   Procedure: Right/Left Heart Cath and Coronary Angiography;  Surgeon: Lennette Bihari, MD;  Location: [facility] INVASIVE CV LAB;  Service: Cardiovascular;  Laterality: N/A;  . VENTRAL HERNIA REPAIR N/A 06/11/2015   Procedure: HERNIA REPAIR VENTRAL ADULT;  Surgeon: Lattie Haw, MD;  Location: [facility] ORS;  Service: General;  Laterality: N/A;   Family History  Problem Relation Age of Onset  . HIV Mother   . Hypertension Mother   . Hypertension Other   . Hypertension Maternal Grandmother   . Hypertension Paternal Grandmother   . Alcohol abuse Neg Hx   . Cancer Neg Hx   . Early death Neg Hx   . Heart disease Neg Hx   . Hyperlipidemia Neg Hx   . Kidney disease Neg Hx   . Stroke Neg Hx   . Heart attack Neg Hx    Social History   Tobacco Use  . Smoking status: Never Smoker  . Smokeless tobacco: Never Used  Substance Use Topics  . Alcohol use: No   No Known Allergies  Prior to Admission medications   Medication Sig Start Date End Date Taking? Authorizing Provider  acetaminophen (TYLENOL) 325 MG tablet Take 2 tablets (650 mg total) by mouth every 6 (six) hours as needed for  mild pain (or Fever >/= 101). 08/25/16  Yes Altamese Dilling, MD  carvedilol (COREG) 25 MG tablet Take 1 tablet (25 mg total) by mouth 2 (two) times daily with a meal. 07/08/16  Yes Hedy Garro A, FNP  dextromethorphan-guaiFENesin (MUCINEX DM) 30-600 MG 12hr tablet Take 2 tablets by mouth 2 (two) times daily as needed for cough.   Yes [provider]  Diphenhydramine-Phenylephrine (THERAFLU COLD/COUGH NIGHTTIME PO) Take 1 packet by mouth daily as needed.   Yes [provider]  DM-APAP-CPM (CORICIDIN HBP FLU PO) Take 2 tablets by mouth every 4 (four) hours as needed.   Yes [provider]  furosemide (LASIX)  40 MG tablet Take 1 tablet (40 mg total) by mouth daily. 08/27/16  Yes Jesper Stirewalt, Inetta Fermo A, FNP  potassium chloride SA (K-DUR,KLOR-CON) 20 MEQ tablet Take 1 tablet (20 mEq total) by mouth daily. 10/27/16  Yes Anand Tejada A, FNP  sacubitril-valsartan (ENTRESTO) 49-51 MG Take 1 tablet by mouth 2 (two) times daily. 07/07/16  Yes Desia Saban, Inetta Fermo A, FNP  albuterol (PROVENTIL HFA;VENTOLIN HFA) 108 (90 BASE) MCG/ACT inhaler Inhale 2 puffs into the lungs every 6 (six) hours as needed for wheezing or shortness of breath. Patient not taking: Reported on 02/24/2017 01/16/14   Etta Grandchild, MD   Review of Systems  Constitutional: Positive for fatigue and fever ("maybe"). Negative for appetite change.  HENT: Positive for congestion and postnasal drip. Negative for sore throat.   Eyes: Negative.   Respiratory: Positive for cough, shortness of breath (with walking long distances) and wheezing. Negative for chest tightness.   Cardiovascular: Positive for palpitations and leg swelling. Negative for chest pain.  Gastrointestinal: Negative for abdominal distention and abdominal pain.  Endocrine: Negative.   Genitourinary: Negative.        Decreased libido   Musculoskeletal: Positive for arthralgias (hands, elbow, feet). Negative for back pain and neck pain.  Skin: Negative.   Allergic/Immunologic: Negative.   Neurological: Positive for light-headedness. Negative for dizziness.  Hematological: Negative for adenopathy. Does not bruise/bleed easily.  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 Negative for dysphoric mood and suicidal ideas. The patient is not nervous/anxious.    Vitals:   02/24/17 0826  BP: (!) 187/98  Pulse: 79  Resp: 18  SpO2: 98%  Weight: (!) 353 lb 2 oz (160.2 kg)  Height: 5\' 7"  (1.702 m)   Wt Readings from Last 3 Encounters:  02/24/17 (!) 353 lb 2 oz (160.2 kg)  11/25/16 (!) 353 lb 8 oz (160.3 kg)  10/27/16 (!) 343 lb 6 oz (155.8 kg)   Lab Results   Component Value Date   CREATININE 1.28 (H) 11/25/2016   CREATININE 1.28 (H) 08/25/2016   CREATININE 1.39 (H) 08/24/2016    Physical Exam  Constitutional: He is oriented to person, place, and time. He appears well-developed and well-nourished.  HENT:  Head: Normocephalic and atraumatic.  Neck: Normal range of motion. Neck supple. No JVD present.  Cardiovascular: Normal rate and regular rhythm.  Pulmonary/Chest: Effort normal. He has no wheezes. He has no rales.  Abdominal: Soft. He exhibits no distension. There is no tenderness.  Musculoskeletal: He exhibits edema (trace edema in bilateral lower legs). He exhibits no tenderness.  Neurological: He is alert and oriented to person, place, and time.  Skin: Skin is warm and dry.  Psychiatric: He has a normal mood and affect. His behavior is normal. Thought content normal.  Nursing note and vitals reviewed.   Assessment & Plan:  1:  Chronic heart failure with reduced ejection fraction- - NYHA class III - euvolemic - weighing daily. Weight stable since he was last here. Reminded to call for an overnight weight gain of >2 pounds or a weekly weight gain of >5 pounds - admits that his eating habits aren't the best as he tends to eat 2 strips of bacon, eggs and potatoes after getting off of work in the morning and then goes straight to bed; also eats a lot of bananas which have a lot of carbs in them - remains uninsured so unable to see a dietician - not adding salt to his food and is reading food labels. Discussed the importance of closely following a 2000mg  sodium diet.  - unsure of fluid intake. Instructed to have a daily fluid intake of 40-60 ounces of fluid daily.   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 Explained that if he would not commit to wearing the mask, that there wouldn't be a point in obtaining funding  - explained the potential consequences of untreated sleep apnea on his heart; he says that he will think about wearing CPAP again  4: URI- - he has been taking OTC mucinex, coricidan and flu/cold medication that has phenylephrine in it - instructed him to stop the flu/cold medication and can continue mucinex and coricidan - he still hasn't called [facility] to get established and he was strongly reminded to call them to get an appointment set up  Medication bottles were reviewed.  Return in 2 months or sooner for any questions/problems before then.

## 2017-02-24 ENCOUNTER — Encounter: Payer: Self-pay | Admitting: Family

## 2017-02-24 ENCOUNTER — Ambulatory Visit: Payer: Self-pay | Attending: Family | Admitting: Family

## 2017-02-24 VITALS — BP 187/98 | HR 79 | Resp 18 | Ht 67.0 in | Wt 353.1 lb

## 2017-02-24 DIAGNOSIS — I10 Essential (primary) hypertension: Secondary | ICD-10-CM

## 2017-02-24 DIAGNOSIS — I110 Hypertensive heart disease with heart failure: Secondary | ICD-10-CM | POA: Insufficient documentation

## 2017-02-24 DIAGNOSIS — J069 Acute upper respiratory infection, unspecified: Secondary | ICD-10-CM | POA: Insufficient documentation

## 2017-02-24 DIAGNOSIS — G4733 Obstructive sleep apnea (adult) (pediatric): Secondary | ICD-10-CM | POA: Insufficient documentation

## 2017-02-24 DIAGNOSIS — Z79899 Other long term (current) drug therapy: Secondary | ICD-10-CM | POA: Insufficient documentation

## 2017-02-24 DIAGNOSIS — J45909 Unspecified asthma, uncomplicated: Secondary | ICD-10-CM | POA: Insufficient documentation

## 2017-02-24 DIAGNOSIS — Z9114 Patient's other noncompliance with medication regimen: Secondary | ICD-10-CM | POA: Insufficient documentation

## 2017-02-24 DIAGNOSIS — I252 Old myocardial infarction: Secondary | ICD-10-CM | POA: Insufficient documentation

## 2017-02-24 DIAGNOSIS — E876 Hypokalemia: Secondary | ICD-10-CM | POA: Insufficient documentation

## 2017-02-24 DIAGNOSIS — I5022 Chronic systolic (congestive) heart failure: Secondary | ICD-10-CM | POA: Insufficient documentation

## 2017-02-24 DIAGNOSIS — I428 Other cardiomyopathies: Secondary | ICD-10-CM | POA: Insufficient documentation

## 2017-02-24 DIAGNOSIS — M109 Gout, unspecified: Secondary | ICD-10-CM | POA: Insufficient documentation

## 2017-02-24 DIAGNOSIS — Z6841 Body Mass Index (BMI) 40.0 and over, adult: Secondary | ICD-10-CM | POA: Insufficient documentation

## 2017-02-24 NOTE — Patient Instructions (Addendum)
Continue weighing daily and call for an overnight weight gain of > 2 pounds or a weekly weight gain of >5 pounds.  Drink between 40-60 ounces of fluid daily  Call [facility] to schedule a new patient appointment. [phone]  Stop taking night time flu/severe cold and cough medication due to making your blood pressure elevated.

## 2017-03-22 NOTE — Progress Notes (Signed)
Patient ID: Noah Rodriguez, male    DOB: September 09, 1975, 42 y.o.   MRN: [id number]  HPI  Noah Rodriguez is a 42 y/o male with a history of obstructive sleep apnea, obesity, hypokalemia, gout, asthma and chronic heart failure.   Echo done 08/25/16 reviewed and shows an EF of 60-65%. Echo done 02/14/16 and showed an EF of 35-40% along with mild Noah/TR. EF has improved slightly from August 2016. Last cardiac catheterization was done 09/17/14 and showed an EF of 20-25% at that time. No CAD present.   Has not been admitted or been in the ED in the last 6 months.   He presents today for a follow-up visit with a chief complaint of moderate fatigue upon minimal exertion. He describes this as chronic in nature having been present for several years. He has associated cough, shortness of breath, occasional wheezing, palpitations, difficulty sleeping (works night shift) and gradual weight gain. He denies any dizziness, edema or chest pain. Has found his CPAP machine and mask and says that it looks brand new but that it needs cleaned and reset at the correct pressures.   Past Medical History:  Diagnosis Date  . Acute kidney injury (HCC)   . Asthma   . Chronic combined systolic and diastolic CHF (congestive heart failure) (HCC)    EF < 25% on Cath (09/17/2014)  . Gout   . Gout of big toe 09/16/2014  . H/O medication noncompliance   . Hypertensive heart disease   . Hypokalemia   . MI (myocardial infarction) [facility])    Patient denies having had a MI  . NICM (nonischemic cardiomyopathy) (HCC)    a. cardiac cath 09/2014 with normal coronary arteries, EF 20-25% with severe global HK; b. echo 08/2014: EF 30-35%, false tendon in LV aepx of no clinical sig, diffuse HK, GR1DD, aortic root 40 mm, trivial Noah, trivial pericardial effusion posterior   . Obesity, Class III, BMI 40-49.9 (morbid obesity) (HCC)   . Sleep apnea    a. sleep study 05/2010; b. noncompliant with CPAP  . Ventral hernia    a. incarcerated omentum  06/10/2015   Past Surgical History:  Procedure Laterality Date  . CARDIAC CATHETERIZATION N/A 09/17/2014   Procedure: Right/Left Heart Cath and Coronary Angiography;  Surgeon: Lennette Bihari, MD;  Location: [facility] INVASIVE CV LAB;  Service: Cardiovascular;  Laterality: N/A;  . VENTRAL HERNIA REPAIR N/A 06/11/2015   Procedure: HERNIA REPAIR VENTRAL ADULT;  Surgeon: Lattie Haw, MD;  Location: [facility] ORS;  Service: General;  Laterality: N/A;   Family History  Problem Relation Age of Onset  . HIV Mother   . Hypertension Mother   . Hypertension Other   . Hypertension Maternal Grandmother   . Hypertension Paternal Grandmother   . Alcohol abuse Neg Hx   . Cancer Neg Hx   . Early death Neg Hx   . Heart disease Neg Hx   . Hyperlipidemia Neg Hx   . Kidney disease Neg Hx   . Stroke Neg Hx   . Heart attack Neg Hx    Social History   Tobacco Use  . Smoking status: Never Smoker  . Smokeless tobacco: Never Used  Substance Use Topics  . Alcohol use: No   No Known Allergies  Prior to Admission medications   Medication Sig Start Date End Date Taking? Authorizing Provider  acetaminophen (TYLENOL) 325 MG tablet Take 2 tablets (650 mg total) by mouth every 6 (six) hours as needed for mild pain (or Fever >/=  101). 08/25/16  Yes Altamese Dilling, MD  carvedilol (COREG) 25 MG tablet Take 1 tablet (25 mg total) by mouth 2 (two) times daily with a meal. 03/24/17  Yes Clarisa Kindred A, FNP  furosemide (LASIX) 40 MG tablet Take 1 tablet (40 mg total) by mouth daily. 03/24/17  Yes Hackney, Tina A, FNP  sacubitril-valsartan (ENTRESTO) 49-51 MG Take 1 tablet by mouth 2 (two) times daily. 07/07/16  Yes Hackney, Inetta Fermo A, FNP  albuterol (PROVENTIL HFA;VENTOLIN HFA) 108 (90 BASE) MCG/ACT inhaler Inhale 2 puffs into the lungs every 6 (six) hours as needed for wheezing or shortness of breath. Patient not taking: Reported on 02/24/2017 01/16/14   Etta Grandchild, MD  potassium chloride SA (K-DUR,KLOR-CON) 20 MEQ  tablet Take 1 tablet (20 mEq total) by mouth daily. Patient not taking: Reported on 03/24/2017 10/27/16   Delma Freeze, FNP    Review of Systems  Constitutional: Positive for fatigue. Negative for appetite change.  HENT: Positive for congestion. Negative for postnasal drip and sore throat.   Eyes: Negative.   Respiratory: Positive for cough, shortness of breath (with walking long distances) and wheezing. Negative for chest tightness.   Cardiovascular: Positive for palpitations. Negative for chest pain and leg swelling.  Gastrointestinal: Negative for abdominal distention, abdominal pain and constipation.  Endocrine: Negative.   Genitourinary: Negative.        Decreased libido   Musculoskeletal: Positive for arthralgias (hands, elbow, feet) and back pain. Negative for neck pain.  Skin: Negative.   Allergic/Immunologic: Negative.   Neurological: Negative for dizziness, light-headedness and numbness.  Hematological: Negative for adenopathy. Does not bruise/bleed easily.  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 Negative for dysphoric mood and suicidal ideas. The patient is not nervous/anxious.    Vitals:   03/24/17 0822  BP: (!) 149/90  Pulse: 85  Resp: 18  SpO2: 98%  Weight: (!) 357 lb (161.9 kg)  Height: 5\' 7"  (1.702 m)   Wt Readings from Last 3 Encounters:  03/24/17 (!) 357 lb (161.9 kg)  02/24/17 (!) 353 lb 2 oz (160.2 kg)  11/25/16 (!) 353 lb 8 oz (160.3 kg)   Lab Results  Component Value Date   CREATININE 1.28 (H) 11/25/2016   CREATININE 1.28 (H) 08/25/2016   CREATININE 1.39 (H) 08/24/2016    Physical Exam  Constitutional: He is oriented to person, place, and time. He appears well-developed and well-nourished.  HENT:  Head: Normocephalic and atraumatic.  Neck: Normal range of motion. Neck supple. No JVD present.  Cardiovascular: Normal rate and regular rhythm.  Pulmonary/Chest: Effort normal. He has no wheezes. He has no rales.   Abdominal: Soft. He exhibits no distension. There is no tenderness.  Musculoskeletal: He exhibits edema (trace edema in bilateral lower legs). He exhibits no tenderness.  Neurological: He is alert and oriented to person, place, and time.  Skin: Skin is warm and dry.  Psychiatric: He has a normal mood and affect. His behavior is normal. Thought content normal.  Nursing note and vitals reviewed.   Assessment & Plan:  1: Chronic heart failure with preserved ejection fraction- - NYHA class III - euvolemic - weighing daily. Reminded to call for an overnight weight gain of >2 pounds or a weekly weight gain of >5 pounds - weight up 4 pounds since he was last here - admits that his eating habits aren't the best as he tends to eat 2 strips of bacon, eggs and potatoes after getting off of work in the morning  and then goes straight to bed; also eats a lot of bananas which have a lot of carbs in them - remains uninsured so unable to see a dietician - not adding salt to his food and is reading food labels. Discussed the importance of closely following a 2000mg  sodium diet.  - unsure of fluid intake. Instructed to have a daily fluid intake of 40-60 ounces of fluid daily.   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 Will have CMA see if anyone can service the equipment but he is agreeable to having another sleep study if needed.   Medication bottles were reviewed.  Return here in 3 months or sooner for any  questions/problems before then.

## 2017-03-24 ENCOUNTER — Encounter: Payer: Self-pay | Admitting: Family

## 2017-03-24 ENCOUNTER — Ambulatory Visit: Payer: Self-pay | Attending: Family | Admitting: Family

## 2017-03-24 VITALS — BP 149/90 | HR 85 | Resp 18 | Ht 67.0 in | Wt 357.0 lb

## 2017-03-24 DIAGNOSIS — N179 Acute kidney failure, unspecified: Secondary | ICD-10-CM | POA: Insufficient documentation

## 2017-03-24 DIAGNOSIS — Z830 Family history of human immunodeficiency virus [HIV] disease: Secondary | ICD-10-CM | POA: Insufficient documentation

## 2017-03-24 DIAGNOSIS — I5042 Chronic combined systolic (congestive) and diastolic (congestive) heart failure: Secondary | ICD-10-CM | POA: Insufficient documentation

## 2017-03-24 DIAGNOSIS — E876 Hypokalemia: Secondary | ICD-10-CM | POA: Insufficient documentation

## 2017-03-24 DIAGNOSIS — I110 Hypertensive heart disease with heart failure: Secondary | ICD-10-CM | POA: Insufficient documentation

## 2017-03-24 DIAGNOSIS — Z6841 Body Mass Index (BMI) 40.0 and over, adult: Secondary | ICD-10-CM | POA: Insufficient documentation

## 2017-03-24 DIAGNOSIS — G4733 Obstructive sleep apnea (adult) (pediatric): Secondary | ICD-10-CM | POA: Insufficient documentation

## 2017-03-24 DIAGNOSIS — Z9114 Patient's other noncompliance with medication regimen: Secondary | ICD-10-CM | POA: Insufficient documentation

## 2017-03-24 DIAGNOSIS — J45909 Unspecified asthma, uncomplicated: Secondary | ICD-10-CM | POA: Insufficient documentation

## 2017-03-24 DIAGNOSIS — Z8249 Family history of ischemic heart disease and other diseases of the circulatory system: Secondary | ICD-10-CM | POA: Insufficient documentation

## 2017-03-24 DIAGNOSIS — I10 Essential (primary) hypertension: Secondary | ICD-10-CM

## 2017-03-24 DIAGNOSIS — Z9889 Other specified postprocedural states: Secondary | ICD-10-CM | POA: Insufficient documentation

## 2017-03-24 DIAGNOSIS — I252 Old myocardial infarction: Secondary | ICD-10-CM | POA: Insufficient documentation

## 2017-03-24 DIAGNOSIS — Z955 Presence of coronary angioplasty implant and graft: Secondary | ICD-10-CM | POA: Insufficient documentation

## 2017-03-24 DIAGNOSIS — Z79899 Other long term (current) drug therapy: Secondary | ICD-10-CM | POA: Insufficient documentation

## 2017-03-24 DIAGNOSIS — I5032 Chronic diastolic (congestive) heart failure: Secondary | ICD-10-CM

## 2017-03-24 MED ORDER — CARVEDILOL 25 MG PO TABS
25.0000 mg | ORAL_TABLET | Freq: Two times a day (BID) | ORAL | 5 refills | Status: DC
Start: 2017-03-24 — End: 2017-10-06

## 2017-03-24 MED ORDER — FUROSEMIDE 40 MG PO TABS: 40.0000 mg | ORAL_TABLET | Freq: Every day | ORAL | 5 refills | Status: DC

## 2017-03-24 NOTE — Patient Instructions (Signed)
Continue weighing daily and call for an overnight weight gain of > 2 pounds or a weekly weight gain of >5 pounds. 

## 2017-06-22 ENCOUNTER — Ambulatory Visit: Payer: Self-pay | Attending: Family | Admitting: Family

## 2017-06-22 ENCOUNTER — Encounter: Payer: Self-pay | Admitting: Family

## 2017-06-22 VITALS — BP 165/102 | HR 79 | Resp 18 | Ht 66.0 in | Wt 355.2 lb

## 2017-06-22 DIAGNOSIS — Z9889 Other specified postprocedural states: Secondary | ICD-10-CM | POA: Insufficient documentation

## 2017-06-22 DIAGNOSIS — Z8249 Family history of ischemic heart disease and other diseases of the circulatory system: Secondary | ICD-10-CM | POA: Insufficient documentation

## 2017-06-22 DIAGNOSIS — Z79899 Other long term (current) drug therapy: Secondary | ICD-10-CM | POA: Insufficient documentation

## 2017-06-22 DIAGNOSIS — J45909 Unspecified asthma, uncomplicated: Secondary | ICD-10-CM | POA: Insufficient documentation

## 2017-06-22 DIAGNOSIS — I110 Hypertensive heart disease with heart failure: Secondary | ICD-10-CM | POA: Insufficient documentation

## 2017-06-22 DIAGNOSIS — I10 Essential (primary) hypertension: Secondary | ICD-10-CM

## 2017-06-22 DIAGNOSIS — Z09 Encounter for follow-up examination after completed treatment for conditions other than malignant neoplasm: Secondary | ICD-10-CM | POA: Insufficient documentation

## 2017-06-22 DIAGNOSIS — E669 Obesity, unspecified: Secondary | ICD-10-CM | POA: Insufficient documentation

## 2017-06-22 DIAGNOSIS — I5042 Chronic combined systolic (congestive) and diastolic (congestive) heart failure: Secondary | ICD-10-CM | POA: Insufficient documentation

## 2017-06-22 DIAGNOSIS — G4733 Obstructive sleep apnea (adult) (pediatric): Secondary | ICD-10-CM | POA: Insufficient documentation

## 2017-06-22 DIAGNOSIS — M109 Gout, unspecified: Secondary | ICD-10-CM | POA: Insufficient documentation

## 2017-06-22 DIAGNOSIS — E876 Hypokalemia: Secondary | ICD-10-CM | POA: Insufficient documentation

## 2017-06-22 DIAGNOSIS — I252 Old myocardial infarction: Secondary | ICD-10-CM | POA: Insufficient documentation

## 2017-06-22 DIAGNOSIS — I5032 Chronic diastolic (congestive) heart failure: Secondary | ICD-10-CM

## 2017-06-22 LAB — BASIC METABOLIC PANEL
Anion gap: 7 (ref 5–15)
BUN: 19 mg/dL (ref 6–20)
CALCIUM: 8.8 mg/dL — AB (ref 8.9–10.3)
CO2: 29 mmol/L (ref 22–32)
CREATININE: 1.53 mg/dL — AB (ref 0.61–1.24)
Chloride: 101 mmol/L (ref 101–111)
GFR calc Af Amer: >60 mL/min (ref >60)
GFR, EST NON AFRICAN AMERICAN: 55 mL/min — AB (ref >60)
GLUCOSE: 99 mg/dL (ref 65–99)
Potassium: 3.6 mmol/L (ref 3.5–5.1)
SODIUM: 137 mmol/L (ref 135–145)

## 2017-06-22 MED ORDER — SACUBITRIL-VALSARTAN 97-103 MG PO TABS: 1.0000 | ORAL_TABLET | Freq: Two times a day (BID) | ORAL | 3 refills | Status: DC

## 2017-06-22 NOTE — Progress Notes (Signed)
Patient ID: Noah Rodriguez, male    DOB: 1975-01-29, 41 y.o.   MRN: [id number]  HPI  [name] is a 42 y/o male with a history of obstructive sleep apnea, obesity, hypokalemia, gout, asthma and chronic heart failure.   Echo done 08/25/16 reviewed and shows an EF of 60-65%. Echo done 02/14/16 and showed an EF of 35-40% along with mild MR/TR. EF has improved slightly from August 2016. Last cardiac catheterization was done 09/17/14 and showed an EF of 20-25% at that time. No CAD present.   Has not been admitted or been in the ED in the last 6 months.   He presents today for a follow-up visit with a chief complaint of moderate fatigue upon minimal exertion. He describes this as chronic in nature having been present for several years. He has associated cough, shortness of breath, occasional wheezing, difficulty sleeping (works night shift) and gradual weight gain. He does not appear to be overly fluid overloaded. He denies any dizziness, edema or chest pain. Has found his CPAP machine and mask and says that it looks brand new but that it needs cleaned and reset at the correct pressures.   He has new complaint today of pressure in his head. He claims that this is only at work which he says is very stressful. He has not noticed any changes in vision or limb weakness.   Past Medical History:  Diagnosis Date  . Acute kidney injury (HCC)   . Asthma   . Chronic combined systolic and diastolic CHF (congestive heart failure) (HCC)    EF < 25% on Cath (09/17/2014)  . Gout   . Gout of big toe 09/16/2014  . H/O medication noncompliance   . Hypertensive heart disease   . Hypokalemia   . MI (myocardial infarction) [facility])    Patient denies having had a MI  . NICM (nonischemic cardiomyopathy) (HCC)    a. cardiac cath 09/2014 with normal coronary arteries, EF 20-25% with severe global HK; b. echo 08/2014: EF 30-35%, false tendon in LV aepx of no clinical sig, diffuse HK, GR1DD, aortic root 40 mm, trivial MR,  trivial pericardial effusion posterior   . Obesity, Class III, BMI 40-49.9 (morbid obesity) (HCC)   . Sleep apnea    a. sleep study 05/2010; b. noncompliant with CPAP  . Ventral hernia    a. incarcerated omentum 06/10/2015   Past Surgical History:  Procedure Laterality Date  . CARDIAC CATHETERIZATION N/A 09/17/2014   Procedure: Right/Left Heart Cath and Coronary Angiography;  Surgeon: Lennette Bihari, MD;  Location: [facility] INVASIVE CV LAB;  Service: Cardiovascular;  Laterality: N/A;  . VENTRAL HERNIA REPAIR N/A 06/11/2015   Procedure: HERNIA REPAIR VENTRAL ADULT;  Surgeon: Lattie Haw, MD;  Location: [facility] ORS;  Service: General;  Laterality: N/A;   Family History  Problem Relation Age of Onset  . HIV Mother   . Hypertension Mother   . Hypertension Other   . Hypertension Maternal Grandmother   . Hypertension Paternal Grandmother   . Alcohol abuse Neg Hx   . Cancer Neg Hx   . Early death Neg Hx   . Heart disease Neg Hx   . Hyperlipidemia Neg Hx   . Kidney disease Neg Hx   . Stroke Neg Hx   . Heart attack Neg Hx    Social History   Tobacco Use  . Smoking status: Never Smoker  . Smokeless tobacco: Never Used  Substance Use Topics  . Alcohol use: No  No Known Allergies  Prior to Admission medications   Medication Sig Start Date End Date Taking? Authorizing Provider  acetaminophen (TYLENOL) 325 MG tablet Take 2 tablets (650 mg total) by mouth every 6 (six) hours as needed for mild pain (or Fever >/= 101). 08/25/16  Yes Altamese Dilling, MD  carvedilol (COREG) 25 MG tablet Take 1 tablet (25 mg total) by mouth 2 (two) times daily with a meal. 03/24/17  Yes Clarisa Kindred A, FNP  furosemide (LASIX) 40 MG tablet Take 1 tablet (40 mg total) by mouth daily. 03/24/17  Yes Hackney, Tina A, FNP  sacubitril-valsartan (ENTRESTO) 49-51 MG Take 1 tablet by mouth 2 (two) times daily. 07/07/16  Yes Hackney, Inetta Fermo A, FNP  albuterol (PROVENTIL HFA;VENTOLIN HFA) 108 (90 BASE) MCG/ACT inhaler Inhale  2 puffs into the lungs every 6 (six) hours as needed for wheezing or shortness of breath. Patient not taking: Reported on 02/24/2017 01/16/14   Etta Grandchild, MD  potassium chloride SA (K-DUR,KLOR-CON) 20 MEQ tablet Take 1 tablet (20 mEq total) by mouth daily. Patient not taking: Reported on 03/24/2017 10/27/16   Delma Freeze, FNP    Review of Systems  Constitutional: Positive for fatigue. Negative for appetite change.  HENT: Positive for congestion. Negative for postnasal drip and sore throat.   Eyes: Negative for pain and visual disturbance.  Respiratory: Positive for cough (productive cough at times), shortness of breath and wheezing (minimal). Negative for chest tightness.   Cardiovascular: Negative for chest pain, palpitations and leg swelling.  Gastrointestinal: Negative for abdominal distention, abdominal pain and constipation.  Endocrine: Negative.   Genitourinary: Negative.           Musculoskeletal: Positive for back pain. Negative for arthralgias and neck pain.  Skin: Negative.   Allergic/Immunologic: Negative.   Neurological: Positive for headaches (right side of head). Negative for dizziness, weakness, light-headedness and numbness.  Hematological: Negative for adenopathy. Does not bruise/bleed easily.  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 Negative for dysphoric mood and suicidal ideas. The patient is not nervous/anxious.    Vitals:   06/22/17 0832  BP: (!) 165/102  Pulse: 79  Resp: 18  SpO2: 98%  Weight: (!) 355 lb 4 oz (161.1 kg)  Height: 5\' 6"  (1.676 m)   Wt Readings from Last 3 Encounters:  06/22/17 (!) 355 lb 4 oz (161.1 kg)  03/24/17 (!) 357 lb (161.9 kg)  02/24/17 (!) 353 lb 2 oz (160.2 kg)   Lab Results  Component Value Date   CREATININE 1.53 (H) 06/22/2017   CREATININE 1.28 (H) 11/25/2016   CREATININE 1.28 (H) 08/25/2016    Physical Exam  Constitutional: He is oriented to person, place, and time. He appears  well-developed and well-nourished.  HENT:  Head: Normocephalic and atraumatic.  Neck: Normal range of motion. Neck supple. No JVD present.  Cardiovascular: Normal rate and regular rhythm.  Pulmonary/Chest: Effort normal. He has no wheezes. He has no rales.  Abdominal: Soft. He exhibits no distension. There is no tenderness.  Musculoskeletal: He exhibits no edema or tenderness.  Neurological: He is alert and oriented to person, place, and time.  Skin: Skin is warm and dry.  Psychiatric: He has a normal mood and affect. His behavior is normal. Thought content normal.  Nursing note and vitals reviewed.   Assessment & Plan:  1: Chronic heart failure with preserved ejection fraction- - NYHA class III - euvolemic - weighing daily. Reminded to call for an overnight weight gain of >2 pounds  or a weekly weight gain of >5 pounds - weight down 2 pounds since he was last here - admits that his eating habits aren't the best as he tends to eat 2 strips of bacon, eggs and potatoes after getting off of work in the morning and then goes straight to bed; also eats a lot of bananas which have a lot of carbs in them. Eats bananas for potassium because he does not like to take the potassium tabs - remains uninsured so unable to see a dietician. Recommended he go to [facility] for [facility] and dietary help - not adding salt to his food and is reading food labels. Discussed the importance of closely following a 2000mg  sodium diet.  - unsure of fluid intake; drinks mainly water and sweet tea. Instructed to have a daily fluid intake of 40-60 ounces of fluid daily.  - last BMP was from 11/25/17. Will get a BMP today to check K  - BMP today 06/22/17 showed Na 137, K 3.6, GFR > 60, and Scr 1.53 - will also increase Entresto as below for elevated BP  2: HTN- - BP elevated today at 165/102 - suspect that this is multifactorial due to working at night, uncontrolled OSA, and stress related to work. Also suspect that head  pressure is likely due to high blood pressure especially since he states that it mainly occurs at work where he is stressed. I stated that as we get his BP more under control that this should help with his head pressure. I also explained that if he has any vision changes or sudden weakness in his limbs that he needs to go to the hospital - BMP today 06/22/17 showed Na 137, K 3.6, GFR > 60, and Scr 1.53 - as BP is elevated will increase his Entresto to 97/103mg  twice daily   3: Obstructive sleep apnea- - found his CPAP machine prior to last visit but states that he has not been using it because it needs cleaned and pressure reset - thinks his last sleep study was ~ 10 years ago - discussed that he needs another sleep study if his other one was actually that old. He expressed understanding that he needs to get a new sleep study. I stated that [facility] would be able to refer him. He also said that since getting new box springs that he has been sleeping much better. I explained that even still he may be having episodes at night where he stops breathing and is unaware. I also explained that untreated OSA can lead to weight gain and increased blood pressure and that this would help with his HTN  Medication bottles were reviewed.  Return on 07/27/17 or sooner for any questions/problems before then.   Yolanda Bonine, PharmD Pharmacy Resident

## 2017-06-22 NOTE — Patient Instructions (Addendum)
Continue weighing daily and call for an overnight weight gain of > 2 pounds or a weekly weight gain of >5 pounds.  Increase entresto by taking 2 twice daily of your current bottle. When you get the new bottle of 97/103mg  you will resume taking it as 1 tablet twice daily.

## 2017-07-14 ENCOUNTER — Other Ambulatory Visit: Payer: Self-pay | Admitting: Family

## 2017-07-27 ENCOUNTER — Ambulatory Visit: Payer: Self-pay | Admitting: Family

## 2017-08-04 NOTE — Progress Notes (Signed)
Patient ID: Noah Rodriguez, male    DOB: 05-05-1975, 43 y.o.   MRN: [id number]  HPI  Noah Rodriguez is a 42 y/o male with a history of obstructive sleep apnea, obesity, hypokalemia, gout, asthma and chronic heart failure.   Echo done 08/25/16 reviewed and shows an EF of 60-65%. Echo done 02/14/16 and showed an EF of 35-40% along with mild Noah/TR. EF has improved slightly from August 2016.   Last cardiac catheterization was done 09/17/14 and showed an EF of 20-25% at that time. No CAD present.   Has not been admitted or been in the ED in the last 6 months.   Noah Rodriguez presents today for a follow-up visit with a chief complaint of minimal fatigue upon moderate exertion. Noah Rodriguez describes this as chronic in nature having been present for several years. Noah Rodriguez does feel like his symptoms are improving and says that Noah Rodriguez "feels good" today. 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 Noah Rodriguez denies any abdominal distention, palpitations, pedal edema, chest pain, shortness of breath, dizziness or weight gain. Says that Noah Rodriguez has a new position at work which requires him to walk more. Drinking numerous 32 ounce sweet teas daily (5-9)    Past Medical History:  Diagnosis Date  . Acute kidney injury (HCC)   . Asthma   . Chronic combined systolic and diastolic CHF (congestive heart failure) (HCC)    EF < 25% on Cath (09/17/2014)  . Gout   . Gout of big toe 09/16/2014  . H/O medication noncompliance   . Hypertensive heart disease   . Hypokalemia   . MI (myocardial infarction) [facility])    Patient denies having had a MI  . NICM (nonischemic cardiomyopathy) (HCC)    a. cardiac cath 09/2014 with normal coronary arteries, EF 20-25% with severe global HK; b. echo 08/2014: EF 30-35%, false tendon in LV aepx of no clinical sig, diffuse HK, GR1DD, aortic root 40 mm, trivial Noah, trivial pericardial effusion posterior   . Obesity, Class III, BMI 40-49.9 (morbid obesity) (HCC)   . Sleep  apnea    a. sleep study 05/2010; b. noncompliant with CPAP  . Ventral hernia    a. incarcerated omentum 06/10/2015   Past Surgical History:  Procedure Laterality Date  . CARDIAC CATHETERIZATION N/A 09/17/2014   Procedure: Right/Left Heart Cath and Coronary Angiography;  Surgeon: Lennette Bihari, MD;  Location: [facility] INVASIVE CV LAB;  Service: Cardiovascular;  Laterality: N/A;  . VENTRAL HERNIA REPAIR N/A 06/11/2015   Procedure: HERNIA REPAIR VENTRAL ADULT;  Surgeon: Lattie Haw, MD;  Location: [facility] ORS;  Service: General;  Laterality: N/A;   Family History  Problem Relation Age of Onset  . HIV Mother   . Hypertension Mother   . Hypertension Other   . Hypertension Maternal Grandmother   . Hypertension Paternal Grandmother   . Alcohol abuse Neg Hx   . Cancer Neg Hx   . Early death Neg Hx   . Heart disease Neg Hx   . Hyperlipidemia Neg Hx   . Kidney disease Neg Hx   . Stroke Neg Hx   . Heart attack Neg Hx    Social History   Tobacco Use  . Smoking status: Never Smoker  . Smokeless tobacco: Never Used  Substance Use Topics  . Alcohol use: No   No Known Allergies  Prior to Admission medications   Medication Sig Start Date End Date Taking? Authorizing Provider  carvedilol (COREG) 25  MG tablet Take 1 tablet (25 mg total) by mouth 2 (two) times daily with a meal. 03/24/17  Yes Clarisa Kindred A, FNP  furosemide (LASIX) 40 MG tablet TAKE 1 TABLET BY MOUTH ONCE DAILY 07/14/17  Yes Hackney, Tina A, FNP  sacubitril-valsartan (ENTRESTO) 97-103 MG Take 1 tablet by mouth 2 (two) times daily. 06/22/17  Yes Hackney, Inetta Fermo A, FNP  albuterol (PROVENTIL HFA;VENTOLIN HFA) 108 (90 BASE) MCG/ACT inhaler Inhale 2 puffs into the lungs every 6 (six) hours as needed for wheezing or shortness of breath. Patient not taking: Reported on 02/24/2017 01/16/14   Etta Grandchild, MD  potassium chloride SA (K-DUR,KLOR-CON) 20 MEQ tablet Take 1 tablet (20 mEq total) by mouth daily. Patient not taking: Reported on  03/24/2017 10/27/16   Delma Freeze, FNP     Review of Systems  Constitutional: Positive for fatigue. Negative for appetite change.  HENT: Positive for congestion. Negative for postnasal drip and sore throat.   Eyes: Negative for pain and visual disturbance.  Respiratory: Positive for cough (dry) and wheezing (minimal). Negative for chest tightness and shortness of breath.   Cardiovascular: Negative for chest pain, palpitations and leg swelling.  Gastrointestinal: Negative for abdominal distention, abdominal pain and constipation.  Endocrine: Negative.   Genitourinary: Negative.   Musculoskeletal: Negative for arthralgias, back pain and neck pain.  Skin: Negative.   Allergic/Immunologic: Negative.   Neurological: Negative for dizziness, weakness, light-headedness, numbness and headaches.  Hematological: Negative for adenopathy. Does not bruise/bleed easily.  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 Negative for dysphoric mood and suicidal ideas. The patient is not nervous/anxious.    Vitals:   08/05/17 0832  BP: (!) 150/99  Pulse: 79  Resp: 18  SpO2: 98%  Weight: (!) 354 lb 4 oz (160.7 kg)  Height: 5\' 6"  (1.676 m)   Wt Readings from Last 3 Encounters:  08/05/17 (!) 354 lb 4 oz (160.7 kg)  06/22/17 (!) 355 lb 4 oz (161.1 kg)  03/24/17 (!) 357 lb (161.9 kg)   Lab Results  Component Value Date   CREATININE 1.53 (H) 06/22/2017   CREATININE 1.28 (H) 11/25/2016   CREATININE 1.28 (H) 08/25/2016    Physical Exam  Constitutional: Noah Rodriguez is oriented to person, place, and time. Noah Rodriguez appears well-developed and well-nourished.  HENT:  Head: Normocephalic and atraumatic.  Neck: Normal range of motion. Neck supple. No JVD present.  Cardiovascular: Normal rate and regular rhythm.  Pulmonary/Chest: Effort normal. Noah Rodriguez has no wheezes. Noah Rodriguez has no rales.  Abdominal: Soft. Noah Rodriguez exhibits no distension. There is no tenderness.  Musculoskeletal: Noah Rodriguez exhibits no edema  or tenderness.  Neurological: Noah Rodriguez is alert and oriented to person, place, and time.  Skin: Skin is warm and dry.  Psychiatric: Noah Rodriguez has a normal mood and affect. His behavior is normal. Thought content normal.  Nursing note and vitals reviewed.   Assessment & Plan:  1: Chronic heart failure with preserved ejection fraction- - NYHA class II - euvolemic - weighing daily. Reminded to call for an overnight weight gain of >2 pounds or a weekly weight gain of >5 pounds - weight stable since Noah Rodriguez was last here - admits that his eating habits aren't the best as Noah Rodriguez tends to eat 2 strips of bacon, eggs and potatoes after getting off of work in the morning and then goes straight to bed; also eats a lot of bananas which have a lot of carbs in them. Eats bananas for potassium because Noah Rodriguez does not like to  take the potassium tabs - remains uninsured so unable to see a dietician. Encouraged him, again, to go to [employer] or [employer] - not adding salt to his food and is reading food labels. Discussed the importance of closely following a 2000mg  sodium diet.  - drinking 5-9 thirty two ounce cups of sweet tea daily along with 4-5 sixteen bottles of water daily. Discussed stopping the tea as Noah Rodriguez's getting way too much sugar in his diet which will help in weight loss.   2: HTN- - BP elevated but improved from last visit - suspect that this is multifactorial due to working at night, uncontrolled OSA, and stress related to work.  - BMP 06/22/17 reviewed and showed Na 137, K 3.6, GFR > 60, and Scr 1.53 - entresto increased to 97/103mg  twice daily at last visit - will get BMP today  3: Obstructive sleep apnea- - has his CPAP machine but states that Noah Rodriguez has not been using it because it needs cleaned and pressure reset - thinks his last sleep study was ~ 10 years ago - still needs updated sleep study  Medication bottles were reviewed.  Return in 2 months or sooner for any questions/problems before then.

## 2017-08-05 ENCOUNTER — Ambulatory Visit: Payer: Self-pay | Attending: Family | Admitting: Family

## 2017-08-05 ENCOUNTER — Encounter: Payer: Self-pay | Admitting: Family

## 2017-08-05 VITALS — BP 150/99 | HR 79 | Resp 18 | Ht 66.0 in | Wt 354.2 lb

## 2017-08-05 DIAGNOSIS — Z6841 Body Mass Index (BMI) 40.0 and over, adult: Secondary | ICD-10-CM | POA: Insufficient documentation

## 2017-08-05 DIAGNOSIS — I252 Old myocardial infarction: Secondary | ICD-10-CM | POA: Insufficient documentation

## 2017-08-05 DIAGNOSIS — Z79899 Other long term (current) drug therapy: Secondary | ICD-10-CM | POA: Insufficient documentation

## 2017-08-05 DIAGNOSIS — E876 Hypokalemia: Secondary | ICD-10-CM | POA: Insufficient documentation

## 2017-08-05 DIAGNOSIS — E669 Obesity, unspecified: Secondary | ICD-10-CM | POA: Insufficient documentation

## 2017-08-05 DIAGNOSIS — I5042 Chronic combined systolic (congestive) and diastolic (congestive) heart failure: Secondary | ICD-10-CM | POA: Insufficient documentation

## 2017-08-05 DIAGNOSIS — I110 Hypertensive heart disease with heart failure: Secondary | ICD-10-CM | POA: Insufficient documentation

## 2017-08-05 DIAGNOSIS — J45909 Unspecified asthma, uncomplicated: Secondary | ICD-10-CM | POA: Insufficient documentation

## 2017-08-05 DIAGNOSIS — M109 Gout, unspecified: Secondary | ICD-10-CM | POA: Insufficient documentation

## 2017-08-05 DIAGNOSIS — I10 Essential (primary) hypertension: Secondary | ICD-10-CM

## 2017-08-05 DIAGNOSIS — G4733 Obstructive sleep apnea (adult) (pediatric): Secondary | ICD-10-CM | POA: Insufficient documentation

## 2017-08-05 LAB — BASIC METABOLIC PANEL
ANION GAP: 7 (ref 5–15)
BUN: 19 mg/dL (ref 6–20)
CALCIUM: 8.5 mg/dL — AB (ref 8.9–10.3)
CO2: 32 mmol/L (ref 22–32)
Chloride: 103 mmol/L (ref 98–111)
Creatinine, Ser: 1.39 mg/dL — ABNORMAL HIGH (ref 0.61–1.24)
GFR calc Af Amer: >60 mL/min (ref >60)
GLUCOSE: 140 mg/dL — AB (ref 70–99)
Potassium: 3.5 mmol/L (ref 3.5–5.1)
Sodium: 142 mmol/L (ref 135–145)

## 2017-08-05 NOTE — Patient Instructions (Signed)
Continue weighing daily and call for an overnight weight gain of > 2 pounds or a weekly weight gain of >5 pounds. 

## 2017-10-05 NOTE — Progress Notes (Signed)
Patient ID: Noah Rodriguez, male    DOB: March 08, 1975, 42 y.o.   MRN: [id number]  HPI  Noah Rodriguez is a 42 y/o male with a history of obstructive sleep apnea, obesity, hypokalemia, gout, asthma and chronic heart failure.   Echo done 08/25/16 reviewed and shows an EF of 60-65%. Echo done 02/14/16 and showed an EF of 35-40% along with mild Noah/TR. EF has improved slightly from August 2016.   Last cardiac catheterization was done 09/17/14 and showed an EF of 20-25% at that time. No CAD present.   Has not been admitted or been in the ED in the last 6 months.   He presents today for a follow-up visit with a chief complaint of minimal fatigue upon moderate exertion. He says this has been chronic in nature having been present for several years. 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 He denies any abdominal distention, palpitations, pedal edema, chest pain, shortness of breath, dizziness or weight gain. He has changed his diet and is eating less fast food along with not drinking tea/soda anymore and only water.     Past Medical History:  Diagnosis Date  . Acute kidney injury (HCC)   . Asthma   . Chronic combined systolic and diastolic CHF (congestive heart failure) (HCC)    EF < 25% on Cath (09/17/2014)  . Gout   . Gout of big toe 09/16/2014  . H/O medication noncompliance   . Hypertensive heart disease   . Hypokalemia   . MI (myocardial infarction) [facility])    Patient denies having had a MI  . NICM (nonischemic cardiomyopathy) (HCC)    a. cardiac cath 09/2014 with normal coronary arteries, EF 20-25% with severe global HK; b. echo 08/2014: EF 30-35%, false tendon in LV aepx of no clinical sig, diffuse HK, GR1DD, aortic root 40 mm, trivial Noah, trivial pericardial effusion posterior   . Obesity, Class III, BMI 40-49.9 (morbid obesity) (HCC)   . Sleep apnea    a. sleep study 05/2010; b. noncompliant with CPAP  . Ventral hernia    a. incarcerated omentum  06/10/2015   Past Surgical History:  Procedure Laterality Date  . CARDIAC CATHETERIZATION N/A 09/17/2014   Procedure: Right/Left Heart Cath and Coronary Angiography;  Surgeon: Lennette Bihari, MD;  Location: [facility] INVASIVE CV LAB;  Service: Cardiovascular;  Laterality: N/A;  . VENTRAL HERNIA REPAIR N/A 06/11/2015   Procedure: HERNIA REPAIR VENTRAL ADULT;  Surgeon: Lattie Haw, MD;  Location: [facility] ORS;  Service: General;  Laterality: N/A;   Family History  Problem Relation Age of Onset  . HIV Mother   . Hypertension Mother   . Hypertension Other   . Hypertension Maternal Grandmother   . Hypertension Paternal Grandmother   . Alcohol abuse Neg Hx   . Cancer Neg Hx   . Early death Neg Hx   . Heart disease Neg Hx   . Hyperlipidemia Neg Hx   . Kidney disease Neg Hx   . Stroke Neg Hx   . Heart attack Neg Hx    Social History   Tobacco Use  . Smoking status: Never Smoker  . Smokeless tobacco: Never Used  Substance Use Topics  . Alcohol use: No   No Known Allergies  Prior to Admission medications   Medication Sig Start Date End Date Taking? Authorizing Provider  carvedilol (COREG) 25 MG tablet Take 1 tablet (25 mg total) by mouth 2 (two) times daily with a  meal. 10/06/17  Yes Clarisa Kindred A, FNP  furosemide (LASIX) 40 MG tablet TAKE 1 TABLET BY MOUTH ONCE DAILY 07/14/17  Yes Hackney, Tina A, FNP  sacubitril-valsartan (ENTRESTO) 97-103 MG Take 1 tablet by mouth 2 (two) times daily. 06/22/17  Yes Hackney, Inetta Fermo A, FNP  albuterol (PROVENTIL HFA;VENTOLIN HFA) 108 (90 BASE) MCG/ACT inhaler Inhale 2 puffs into the lungs every 6 (six) hours as needed for wheezing or shortness of breath. Patient not taking: Reported on 02/24/2017 01/16/14   Etta Grandchild, MD  potassium chloride SA (K-DUR,KLOR-CON) 20 MEQ tablet Take 1 tablet (20 mEq total) by mouth daily. Patient not taking: Reported on 03/24/2017 10/27/16   Delma Freeze, FNP   Review of Systems  Constitutional: Positive for fatigue. Negative  for appetite change.  HENT: Positive for congestion. Negative for postnasal drip and sore throat.   Eyes: Negative for pain and visual disturbance.  Respiratory: Positive for cough (dry). Negative for chest tightness, shortness of breath and wheezing.   Cardiovascular: Negative for chest pain, palpitations and leg swelling.  Gastrointestinal: Negative for abdominal distention, abdominal pain and constipation.  Endocrine: Negative.   Genitourinary: Negative.   Musculoskeletal: Negative for arthralgias, back pain and neck pain.  Skin: Negative.   Allergic/Immunologic: Negative.   Neurological: Negative for dizziness, weakness, light-headedness, numbness and headaches.  Hematological: Negative for adenopathy. Does not bruise/bleed easily.  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 Negative for dysphoric mood and suicidal ideas. The patient is not nervous/anxious.    Vitals:   10/06/17 0827  BP: (!) 161/95  Pulse: 66  Resp: 18  SpO2: 97%  Weight: (!) 338 lb 8 oz (153.5 kg)  Height: 5\' 6"  (1.676 m)   Wt Readings from Last 3 Encounters:  10/06/17 (!) 338 lb 8 oz (153.5 kg)  08/05/17 (!) 354 lb 4 oz (160.7 kg)  06/22/17 (!) 355 lb 4 oz (161.1 kg)   Lab Results  Component Value Date   CREATININE 1.39 (H) 08/05/2017   CREATININE 1.53 (H) 06/22/2017   CREATININE 1.28 (H) 11/25/2016    Physical Exam  Constitutional: He is oriented to person, place, and time. He appears well-developed and well-nourished.  HENT:  Head: Normocephalic and atraumatic.  Neck: Normal range of motion. Neck supple. No JVD present.  Cardiovascular: Normal rate and regular rhythm.  Pulmonary/Chest: Effort normal. He has no wheezes. He has no rales.  Abdominal: Soft. He exhibits no distension. There is no tenderness.  Musculoskeletal: He exhibits no edema or tenderness.  Neurological: He is alert and oriented to person, place, and time.  Skin: Skin is warm and dry.   Psychiatric: He has a normal mood and affect. His behavior is normal. Thought content normal.  Nursing note and vitals reviewed.   Assessment & Plan:  1: Chronic heart failure with preserved ejection fraction- - NYHA class II - euvolemic - weighing daily. Reminded to call for an overnight weight gain of >2 pounds or a weekly weight gain of >5 pounds - weight down 16 pounds since he was last here 2 months ago - says that he's drastically changed his eating habits. Is now eating more home cooked meals and less fast food. Also says that he's now drinking water and no tea/soda - not adding salt to his food and is reading food labels. Discussed the importance of closely following a 2000mg  sodium diet.  - says that he's also walking more at work  2: HTN- - BP elevated this morning bu the  says that he took his medications 1/2 hour prior to this visit - suspect that this is multifactorial due to working at night, uncontrolled OSA, and stress related to work.  - may need to add spironolactone if BP remains elevated - BMP 08/05/17 reviewed and showed Na 142, K 3.5, GFR > 60, and Scr 1.39  3: Obstructive sleep apnea- - has his CPAP machine but states that he has not been using it because it needs cleaned and pressure reset - thinks his last sleep study was ~ 10 years ago - still needs updated sleep study  Medication bottles were reviewed.  Return in 6 weeks or sooner for any questions/problems before then.

## 2017-10-06 ENCOUNTER — Ambulatory Visit: Payer: Self-pay | Attending: Family | Admitting: Family

## 2017-10-06 ENCOUNTER — Encounter: Payer: Self-pay | Admitting: Family

## 2017-10-06 VITALS — BP 161/95 | HR 66 | Resp 18 | Ht 66.0 in | Wt 338.5 lb

## 2017-10-06 DIAGNOSIS — G4733 Obstructive sleep apnea (adult) (pediatric): Secondary | ICD-10-CM | POA: Insufficient documentation

## 2017-10-06 DIAGNOSIS — I428 Other cardiomyopathies: Secondary | ICD-10-CM | POA: Insufficient documentation

## 2017-10-06 DIAGNOSIS — I10 Essential (primary) hypertension: Secondary | ICD-10-CM

## 2017-10-06 DIAGNOSIS — Z79899 Other long term (current) drug therapy: Secondary | ICD-10-CM | POA: Insufficient documentation

## 2017-10-06 DIAGNOSIS — E669 Obesity, unspecified: Secondary | ICD-10-CM | POA: Insufficient documentation

## 2017-10-06 DIAGNOSIS — J45909 Unspecified asthma, uncomplicated: Secondary | ICD-10-CM | POA: Insufficient documentation

## 2017-10-06 DIAGNOSIS — I252 Old myocardial infarction: Secondary | ICD-10-CM | POA: Insufficient documentation

## 2017-10-06 DIAGNOSIS — Z830 Family history of human immunodeficiency virus [HIV] disease: Secondary | ICD-10-CM | POA: Insufficient documentation

## 2017-10-06 DIAGNOSIS — Z8249 Family history of ischemic heart disease and other diseases of the circulatory system: Secondary | ICD-10-CM | POA: Insufficient documentation

## 2017-10-06 DIAGNOSIS — Z6841 Body Mass Index (BMI) 40.0 and over, adult: Secondary | ICD-10-CM | POA: Insufficient documentation

## 2017-10-06 DIAGNOSIS — I5032 Chronic diastolic (congestive) heart failure: Secondary | ICD-10-CM | POA: Insufficient documentation

## 2017-10-06 DIAGNOSIS — I110 Hypertensive heart disease with heart failure: Secondary | ICD-10-CM | POA: Insufficient documentation

## 2017-10-06 MED ORDER — CARVEDILOL 25 MG PO TABS: 25.0000 mg | ORAL_TABLET | Freq: Two times a day (BID) | ORAL | 3 refills | Status: DC

## 2017-10-06 NOTE — Patient Instructions (Signed)
Continue weighing daily and call for an overnight weight gain of > 2 pounds or a weekly weight gain of >5 pounds. 

## 2017-10-07 ENCOUNTER — Encounter: Payer: Self-pay | Admitting: Family

## 2017-11-22 NOTE — Progress Notes (Signed)
Patient ID: Noah Rodriguez, male    DOB: Jan 28, 1975, 42 y.o.   MRN: [id number]  HPI  Noah Rodriguez is Rodriguez 42 y/o male with Rodriguez history of obstructive sleep apnea, obesity, hypokalemia, gout, asthma and chronic heart failure.   Echo done 08/25/16 reviewed and shows an EF of 60-65%. Echo done 02/14/16 and showed an EF of 35-40% along with mild Noah/TR. EF has improved slightly from August 2016.   Last cardiac catheterization was done 09/17/14 and showed an EF of 20-25% at that time. No CAD present.   Has not been admitted or been in the ED in the last 6 months.   He presents today for Rodriguez follow-up visit with Rodriguez chief complaint of Rodriguez dry cough. He says this has been present for several years. He has associated head congestion along with this. 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 Has been walking more at work and really trying to watch what he eats.    Past Medical History:  Diagnosis Date  . Acute kidney injury (HCC)   . Asthma   . Chronic combined systolic and diastolic CHF (congestive heart failure) (HCC)    EF < 25% on Cath (09/17/2014)  . Gout   . Gout of big toe 09/16/2014  . H/O medication noncompliance   . Hypertensive heart disease   . Hypokalemia   . MI (myocardial infarction) [facility])    Patient denies having had Rodriguez MI  . NICM (nonischemic cardiomyopathy) (HCC)    Rodriguez. cardiac cath 09/2014 with normal coronary arteries, EF 20-25% with severe global HK; b. echo 08/2014: EF 30-35%, false tendon in LV aepx of no clinical sig, diffuse HK, GR1DD, aortic root 40 mm, trivial Noah, trivial pericardial effusion posterior   . Obesity, Class III, BMI 40-49.9 (morbid obesity) (HCC)   . Sleep apnea    Rodriguez. sleep study 05/2010; b. noncompliant with CPAP  . Ventral hernia    Rodriguez. incarcerated omentum 06/10/2015   Past Surgical History:  Procedure  Laterality Date  . CARDIAC CATHETERIZATION N/Rodriguez 09/17/2014   Procedure: Right/Left Heart Cath and Coronary Angiography;  Surgeon: Noah Bihari, MD;  Location: [facility] INVASIVE CV LAB;  Service: Cardiovascular;  Laterality: N/Rodriguez;  . VENTRAL HERNIA REPAIR N/Rodriguez 06/11/2015   Procedure: HERNIA REPAIR VENTRAL ADULT;  Surgeon: Noah Haw, MD;  Location: [facility] ORS;  Service: General;  Laterality: N/Rodriguez;   Family History  Problem Relation Age of Onset  . HIV Mother   . Hypertension Mother   . Hypertension Other   . Hypertension Maternal Grandmother   . Hypertension Paternal Grandmother   . Alcohol abuse Neg Hx   . Cancer Neg Hx   . Early death Neg Hx   . Heart disease Neg Hx   . Hyperlipidemia Neg Hx   . Kidney disease Neg Hx   . Stroke Neg Hx   . Heart attack Neg Hx    Social History   Tobacco Use  . Smoking status: Never Smoker  . Smokeless tobacco: Never Used  Substance Use Topics  . Alcohol use: No   No Known Allergies  Prior to Admission medications   Medication Sig Start Date End Date Taking? Authorizing Provider  albuterol (PROVENTIL HFA;VENTOLIN HFA) 108 (90 BASE) MCG/ACT inhaler Inhale 2 puffs into the lungs every 6 (six) hours as needed for wheezing or shortness of breath.  01/16/14  Yes Noah Grandchild, MD  carvedilol (COREG) 25 MG tablet Take 1 tablet (25 mg total) by mouth 2 (two) times daily with Rodriguez meal. 10/06/17  Yes Noah Kindred A, FNP  furosemide (LASIX) 40 MG tablet TAKE 1 TABLET BY MOUTH ONCE DAILY 07/14/17  Yes Noah Kindred A, FNP  potassium chloride SA (K-DUR,KLOR-CON) 20 MEQ tablet Take 1 tablet (20 mEq total) by mouth daily. 10/27/16  Yes Noah Gandolfi A, FNP  sacubitril-valsartan (ENTRESTO) 97-103 MG Take 1 tablet by mouth 2 (two) times daily. 06/22/17  Yes Noah Freeze, FNP    Review of Systems  Constitutional: Negative for appetite change and fatigue.  HENT: Positive for congestion. Negative for postnasal drip and sore throat.   Eyes: Negative for pain and visual  disturbance.  Respiratory: Positive for cough (dry). Negative for chest tightness, shortness of breath and wheezing.   Cardiovascular: Negative for chest pain, palpitations and leg swelling.  Gastrointestinal: Negative for abdominal distention, abdominal pain and constipation.  Endocrine: Negative.   Genitourinary: Negative.   Musculoskeletal: Positive for arthralgias (right to sore from gout). Negative for back pain and neck pain.  Skin: Negative.   Allergic/Immunologic: Negative.   Neurological: Negative for dizziness, weakness, light-headedness, numbness and headaches.  Hematological: Negative for adenopathy. Does not bruise/bleed easily.  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 Negative for dysphoric mood and suicidal ideas. The patient is not nervous/anxious.    Vitals:   11/24/17 0827  BP: (!) 157/97  Pulse: 73  Resp: 18  SpO2: 99%  Weight: (!) 334 lb 2 oz (151.6 kg)  Height: 5\' 6"  (1.676 m)   Wt Readings from Last 3 Encounters:  11/24/17 (!) 334 lb 2 oz (151.6 kg)  10/06/17 (!) 338 lb 8 oz (153.5 kg)  08/05/17 (!) 354 lb 4 oz (160.7 kg)   Lab Results  Component Value Date   CREATININE 1.39 (H) 08/05/2017   CREATININE 1.53 (H) 06/22/2017   CREATININE 1.28 (H) 11/25/2016    Physical Exam  Constitutional: He is oriented to person, place, and time. He appears well-developed and well-nourished.  HENT:  Head: Normocephalic and atraumatic.  Neck: Normal range of motion. Neck supple. No JVD present.  Cardiovascular: Normal rate and regular rhythm.  Pulmonary/Chest: Effort normal. He has no wheezes. He has no rales.  Abdominal: Soft. He exhibits no distension. There is no tenderness.  Musculoskeletal: He exhibits no edema or tenderness.  Neurological: He is alert and oriented to person, place, and time.  Skin: Skin is warm and dry.  Psychiatric: He has Rodriguez normal mood and affect. His behavior is normal. Thought content normal.  Nursing  note and vitals reviewed.   Assessment & Plan:  1: Chronic heart failure with preserved ejection fraction- - NYHA class II - euvolemic - weighing daily. Reminded to call for an overnight weight gain of >2 pounds or Rodriguez weekly weight gain of >5 pounds - weight down another 4 pounds since last here 6 weeks ago (21 pounds down in the last 5 months) - says that he's drastically changed his eating habits. Is now eating more home cooked meals and less fast food. Also says that he's now drinking water and no tea/soda - not adding salt to his food and is reading food labels. Discussed the importance of closely following Rodriguez 2000mg  sodium diet.  - says that he's also walking more at work; encouraged him to try to walk faster to get his HR up or continue to walk  even more - has not gotten his flu vaccine for this season yet  2: HTN- - BP mildly elevated but slowly improving as weight is declining - suspect that this is multifactorial due to working at night, uncontrolled OSA, and stress related to work.  - may need to add spironolactone if BP remains elevated; will hold off for now  - BMP 08/05/17 reviewed and showed Na 142, K 3.5, GFR > 60, and Scr 1.39  3: Obstructive sleep apnea- - has his CPAP machine but states that he has not been using it because it needs cleaned and pressure reset - thinks his last sleep study was ~ 10 years ago - still needs updated sleep study  Patient did not bring his medications nor Rodriguez list. Each medication was verbally reviewed with the patient and he was encouraged to bring the bottles to every visit to confirm accuracy of list.  Return in 2 months or sooner for any questions/problems before then.

## 2017-11-24 ENCOUNTER — Encounter: Payer: Self-pay | Admitting: Family

## 2017-11-24 ENCOUNTER — Ambulatory Visit: Payer: Self-pay | Attending: Family | Admitting: Family

## 2017-11-24 VITALS — BP 157/97 | HR 73 | Resp 18 | Ht 66.0 in | Wt 334.1 lb

## 2017-11-24 DIAGNOSIS — G4733 Obstructive sleep apnea (adult) (pediatric): Secondary | ICD-10-CM | POA: Insufficient documentation

## 2017-11-24 DIAGNOSIS — J45909 Unspecified asthma, uncomplicated: Secondary | ICD-10-CM | POA: Insufficient documentation

## 2017-11-24 DIAGNOSIS — E876 Hypokalemia: Secondary | ICD-10-CM | POA: Insufficient documentation

## 2017-11-24 DIAGNOSIS — I5042 Chronic combined systolic (congestive) and diastolic (congestive) heart failure: Secondary | ICD-10-CM | POA: Insufficient documentation

## 2017-11-24 DIAGNOSIS — Z79899 Other long term (current) drug therapy: Secondary | ICD-10-CM | POA: Insufficient documentation

## 2017-11-24 DIAGNOSIS — I5032 Chronic diastolic (congestive) heart failure: Secondary | ICD-10-CM

## 2017-11-24 DIAGNOSIS — I110 Hypertensive heart disease with heart failure: Secondary | ICD-10-CM | POA: Insufficient documentation

## 2017-11-24 DIAGNOSIS — M109 Gout, unspecified: Secondary | ICD-10-CM | POA: Insufficient documentation

## 2017-11-24 DIAGNOSIS — I252 Old myocardial infarction: Secondary | ICD-10-CM | POA: Insufficient documentation

## 2017-11-24 DIAGNOSIS — I10 Essential (primary) hypertension: Secondary | ICD-10-CM

## 2017-11-24 NOTE — Patient Instructions (Signed)
Continue weighing daily and call for an overnight weight gain of > 2 pounds or a weekly weight gain of >5 pounds. 

## 2018-01-23 NOTE — Progress Notes (Signed)
Patient ID: Noah Rodriguez, male    DOB: 10/05/1975, 43 y.o.   MRN: [id number]  HPI  Noah Rodriguez is a 43 y/o male with a history of obstructive sleep apnea, obesity, hypokalemia, gout, asthma and chronic heart failure.   Echo done 08/25/16 reviewed and shows an EF of 60-65%. Echo done 02/14/16 and showed an EF of 35-40% along with mild Noah/TR. EF has improved slightly from August 2016.   Last cardiac catheterization was done 09/17/14 and showed an EF of 20-25% at that time. No CAD present.   Has not been admitted or been in the ED in the last 6 months.   He presents today for a follow-up visit with a chief complaint of minimal shortness of breath upon moderate exertion. He describes this as chronic in nature having been present for several years. He has associated cough, head congestion and difficulty sleeping along with this. He denies fatigue, chest pain, pedal edema, palpitations, abdominal distention, dizziness or weight gain. When discussing his continued high BP, he says that he's only been taking his entresto and carvedilol once daily for "awhile" because he had it in his head to take his medications once daily and admits that he didn't read the directions on the label.   Past Medical History:  Diagnosis Date  . Acute kidney injury (HCC)   . Asthma   . Chronic combined systolic and diastolic CHF (congestive heart failure) (HCC)    EF < 25% on Cath (09/17/2014)  . Gout   . Gout of big toe 09/16/2014  . H/O medication noncompliance   . Hypertensive heart disease   . Hypokalemia   . MI (myocardial infarction) [facility](HCC)    Patient denies having had a MI  . NICM (nonischemic cardiomyopathy) (HCC)    a. cardiac cath 09/2014 with normal coronary arteries, EF 20-25% with severe global HK; b. echo 08/2014: EF 30-35%, false tendon in LV aepx of no clinical sig, diffuse HK, GR1DD, aortic root 40 mm, trivial Noah, trivial pericardial effusion posterior   . Obesity, Class III, BMI 40-49.9 (morbid obesity)  (HCC)   . Sleep apnea    a. sleep study 05/2010; b. noncompliant with CPAP  . Ventral hernia    a. incarcerated omentum 06/10/2015   Past Surgical History:  Procedure Laterality Date  . CARDIAC CATHETERIZATION N/A 09/17/2014   Procedure: Right/Left Heart Cath and Coronary Angiography;  Surgeon: Lennette Biharihomas A Kelly, MD;  [facility];  Service: Cardiovascular;  Laterality: N/A;  . VENTRAL HERNIA REPAIR N/A 06/11/2015   Procedure: HERNIA REPAIR VENTRAL ADULT;  Surgeon: Lattie Hawichard E Cooper, MD;  Location: [facility] ORS;  Service: General;  Laterality: N/A;   Family History  Problem Relation Age of Onset  . HIV Mother   . Hypertension Mother   . Hypertension Other   . Hypertension Maternal Grandmother   . Hypertension Paternal Grandmother   . Alcohol abuse Neg Hx   . Cancer Neg Hx   . Early death Neg Hx   . Heart disease Neg Hx   . Hyperlipidemia Neg Hx   . Kidney disease Neg Hx   . Stroke Neg Hx   . Heart attack Neg Hx    Social History   Tobacco Use  . Smoking status: Never Smoker  . Smokeless tobacco: Never Used  Substance Use Topics  . Alcohol use: No   No Known Allergies  Prior to Admission medications   Medication Sig Start Date End Date Taking? Authorizing Provider  carvedilol (  COREG) 25 MG tablet Take 1 tablet (25 mg total) by mouth 2 (two) times daily with a meal. 10/06/17  Yes Clarisa Kindred A, FNP  furosemide (LASIX) 40 MG tablet TAKE 1 TABLET BY MOUTH ONCE DAILY 07/14/17  Yes Zarin Hagmann A, FNP  sacubitril-valsartan (ENTRESTO) 97-103 MG Take 1 tablet by mouth 2 (two) times daily. 06/22/17  Yes Hanifah Royse, Inetta Fermo A, FNP  albuterol (PROVENTIL HFA;VENTOLIN HFA) 108 (90 BASE) MCG/ACT inhaler Inhale 2 puffs into the lungs every 6 (six) hours as needed for wheezing or shortness of breath. Patient not taking: Reported on 01/24/2018 01/16/14   Etta Grandchild, MD  potassium chloride SA (K-DUR,KLOR-CON) 20 MEQ tablet Take 1 tablet (20 mEq total) by mouth daily. Patient not taking:  Reported on 01/24/2018 10/27/16   Delma Freeze, FNP    Review of Systems  Constitutional: Negative for appetite change and fatigue.  HENT: Positive for congestion. Negative for postnasal drip and sore throat.   Eyes: Negative for pain and visual disturbance.  Respiratory: Positive for cough (dry) and shortness of breath (minimal). Negative for chest tightness and wheezing.   Cardiovascular: Negative for chest pain, palpitations and leg swelling.  Gastrointestinal: Negative for abdominal distention, abdominal pain and constipation.  Endocrine: Negative.   Genitourinary: Negative.   Musculoskeletal: Positive for arthralgias (right to sore from gout). Negative for back pain and neck pain.  Skin: Negative.   Allergic/Immunologic: Negative.   Neurological: Negative for dizziness, weakness, light-headedness, numbness and headaches.  Hematological: Negative for adenopathy. Does not bruise/bleed easily.  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 Negative for dysphoric mood and suicidal ideas. The patient is not nervous/anxious.    Vitals:   01/24/18 0824  BP: (!) 171/111  Pulse: 70  Resp: 18  SpO2: 96%  Weight: (!) 334 lb 8 oz (151.7 kg)  Height: 5\' 7"  (1.702 m)   Wt Readings from Last 3 Encounters:  01/24/18 (!) 334 lb 8 oz (151.7 kg)  11/24/17 (!) 334 lb 2 oz (151.6 kg)  10/06/17 (!) 338 lb 8 oz (153.5 kg)   Lab Results  Component Value Date   CREATININE 1.39 (H) 08/05/2017   CREATININE 1.53 (H) 06/22/2017   CREATININE 1.28 (H) 11/25/2016    Physical Exam  Constitutional: He is oriented to person, place, and time. He appears well-developed and well-nourished.  HENT:  Head: Normocephalic and atraumatic.  Neck: Normal range of motion. Neck supple. No JVD present.  Cardiovascular: Normal rate and regular rhythm.  Pulmonary/Chest: Effort normal. He has no wheezes. He has no rales.  Abdominal: Soft. He exhibits no distension. There is no  abdominal tenderness.  Musculoskeletal:        General: No tenderness or edema.  Neurological: He is alert and oriented to person, place, and time.  Skin: Skin is warm and dry.  Psychiatric: He has a normal mood and affect. His behavior is normal. Thought content normal.  Nursing note and vitals reviewed.   Assessment & Plan:  1: Chronic heart failure with preserved ejection fraction- - NYHA class II - euvolemic - weighing daily. Reminded to call for an overnight weight gain of >2 pounds or a weekly weight gain of >5 pounds - weight unchanged from last visit 2 months ago   - continues to eat more home cooked meals and less fast food. Also says that he's still drinking water and no tea/soda - not adding salt to his food and is reading food labels. Discussed the importance of closely following  a 2000mg  sodium diet.  - says that he's also walking more at work and that he's been walking more at home - has not gotten his flu vaccine for this season yet  2: HTN- - BP elevated today but he admits that he's only been taking his carvedilol and entresto once daily instead of twice daily - suspect that this is multifactorial due to working at night, uncontrolled OSA, and stress related to work.  - explained that he needed to take the carvedilol and entresto 2 times daily; will hold off adding spironolactone - BMP 08/05/17 reviewed and showed Na 142, K 3.5, GFR > 60, and Scr 1.39  3: Obstructive sleep apnea- - has his CPAP machine but states that he has not been using it because it needs cleaned and pressure reset - thinks his last sleep study was ~ 10 years ago - needs updated sleep study but he remains uninsured  Medication bottles were reviewed.   Return in 6 weeks or sooner for any questions/problems before then.

## 2018-01-24 ENCOUNTER — Ambulatory Visit: Payer: Self-pay | Attending: Family | Admitting: Family

## 2018-01-24 ENCOUNTER — Encounter: Payer: Self-pay | Admitting: Family

## 2018-01-24 VITALS — BP 171/111 | HR 70 | Resp 18 | Ht 67.0 in | Wt 334.5 lb

## 2018-01-24 DIAGNOSIS — R0602 Shortness of breath: Secondary | ICD-10-CM | POA: Insufficient documentation

## 2018-01-24 DIAGNOSIS — I5032 Chronic diastolic (congestive) heart failure: Secondary | ICD-10-CM

## 2018-01-24 DIAGNOSIS — I5042 Chronic combined systolic (congestive) and diastolic (congestive) heart failure: Secondary | ICD-10-CM | POA: Insufficient documentation

## 2018-01-24 DIAGNOSIS — J45909 Unspecified asthma, uncomplicated: Secondary | ICD-10-CM | POA: Insufficient documentation

## 2018-01-24 DIAGNOSIS — I110 Hypertensive heart disease with heart failure: Secondary | ICD-10-CM | POA: Insufficient documentation

## 2018-01-24 DIAGNOSIS — G4733 Obstructive sleep apnea (adult) (pediatric): Secondary | ICD-10-CM | POA: Insufficient documentation

## 2018-01-24 DIAGNOSIS — M109 Gout, unspecified: Secondary | ICD-10-CM | POA: Insufficient documentation

## 2018-01-24 DIAGNOSIS — E876 Hypokalemia: Secondary | ICD-10-CM | POA: Insufficient documentation

## 2018-01-24 DIAGNOSIS — I252 Old myocardial infarction: Secondary | ICD-10-CM | POA: Insufficient documentation

## 2018-01-24 DIAGNOSIS — Z79899 Other long term (current) drug therapy: Secondary | ICD-10-CM | POA: Insufficient documentation

## 2018-01-24 DIAGNOSIS — I10 Essential (primary) hypertension: Secondary | ICD-10-CM

## 2018-01-24 DIAGNOSIS — Z8249 Family history of ischemic heart disease and other diseases of the circulatory system: Secondary | ICD-10-CM | POA: Insufficient documentation

## 2018-01-24 NOTE — Patient Instructions (Addendum)
Continue weighing daily and call for an overnight weight gain of > 2 pounds or a weekly weight gain of >5 pounds.  Take the carvedilol and entresto as 1 tablet twice daily

## 2018-03-06 NOTE — Progress Notes (Signed)
Patient ID: Noah Rodriguez, male    DOB: August 25, 1975, 43 y.o.   MRN: [id number]  HPI  Noah Rodriguez is a 43 y/o male with a history of obstructive sleep apnea, obesity, hypokalemia, gout, asthma and chronic heart failure.   Echo done 08/25/16 reviewed and shows an EF of 60-65%. Echo done 02/14/16 and showed an EF of 35-40% along with mild Noah/TR. EF has improved slightly from August 2016.   Last cardiac catheterization was done 09/17/14 and showed an EF of 20-25% at that time. No CAD present.   Has not been admitted or been in the ED in the last 6 months.   He presents today for a follow-up visit with a chief complaint of minimal shortness of breath upon moderate exertion. He describes this as chronic in nature having been present for several years. He has associated head congestion, difficulty sleeping and weight gain along with this. He denies any dizziness, abdominal distention, palpitations, pedal edema, chest pain, cough or fatigue. He says that he hasn't had scales for the last 2 months and has resumed drinking soda. Says that he drinks ~60 ounces of regular soda 3 times/ week and has also started eating kit kats and tootsie rolls. Took his medications about 1 hour prior to coming to the office. 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   Past Medical History:  Diagnosis Date  . Acute kidney injury ([place])   . Asthma   . Chronic combined systolic and diastolic CHF (congestive heart failure) (HCC)    EF < 25% on Cath (09/17/2014)  . Gout   . Gout of big toe 09/16/2014  . H/O medication noncompliance   . Hypertensive heart disease   . Hypokalemia   . MI (myocardial infarction) [facility])    Patient denies having had a MI  . NICM (nonischemic cardiomyopathy) ([place])    a. cardiac cath 09/2014 with normal coronary arteries, EF 20-25% with severe global HK; b. echo 08/2014: EF 30-35%, false tendon in LV aepx of no clinical sig, diffuse HK, GR1DD, aortic root 40 mm, trivial Noah, trivial  pericardial effusion posterior   . Obesity, Class III, BMI 40-49.9 (morbid obesity) ([place])   . Sleep apnea    a. sleep study 05/2010; b. noncompliant with CPAP  . Ventral hernia    a. incarcerated omentum 06/10/2015   Past Surgical History:  Procedure Laterality Date  . CARDIAC CATHETERIZATION N/A 09/17/2014   Procedure: Right/Left Heart Cath and Coronary Angiography;  Surgeon: Troy Sine, MD;  Location: [place] CV LAB;  Service: Cardiovascular;  Laterality: N/A;  . VENTRAL HERNIA REPAIR N/A 06/11/2015   Procedure: HERNIA REPAIR VENTRAL ADULT;  Surgeon: Florene Glen, MD;  Location: [facility] ORS;  Service: General;  Laterality: N/A;   Family History  Problem Relation Age of Onset  . HIV Mother   . Hypertension Mother   . Hypertension Other   . Hypertension Maternal Grandmother   . Hypertension Paternal Grandmother   . Alcohol abuse Neg Hx   . Cancer Neg Hx   . Early death Neg Hx   . Heart disease Neg Hx   . Hyperlipidemia Neg Hx   . Kidney disease Neg Hx   . Stroke Neg Hx   . Heart attack Neg Hx    Social History   Tobacco Use  . Smoking status: Never Smoker  . Smokeless tobacco: Never Used  Substance Use Topics  . Alcohol use: No   No Known Allergies  Prior to  Admission medications   Medication Sig Start Date End Date Taking? Authorizing Provider  carvedilol (COREG) 25 MG tablet Take 1 tablet (25 mg total) by mouth 2 (two) times daily with a meal. 10/06/17  Yes Darylene Price A, FNP  furosemide (LASIX) 40 MG tablet TAKE 1 TABLET BY MOUTH ONCE DAILY 07/14/17  Yes Hackney, Tina A, FNP  sacubitril-valsartan (ENTRESTO) 97-103 MG Take 1 tablet by mouth 2 (two) times daily. 06/22/17  Yes Alisa Graff, FNP    Review of Systems  Constitutional: Negative for appetite change and fatigue.  HENT: Positive for congestion. Negative for postnasal drip and sore throat.   Eyes: Negative for pain and visual disturbance.  Respiratory: Positive for shortness of breath (minimal).  Negative for cough, chest tightness and wheezing.   Cardiovascular: Negative for chest pain, palpitations and leg swelling.  Gastrointestinal: Negative for abdominal distention, abdominal pain and constipation.  Endocrine: Negative.   Genitourinary: Negative.   Musculoskeletal: Positive for arthralgias (right to sore from gout). Negative for back pain and neck pain.  Skin: Negative.   Allergic/Immunologic: Negative.   Neurological: Negative for dizziness, weakness, light-headedness, numbness and headaches.  Hematological: Negative for adenopathy. Does not bruise/bleed easily.  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 Negative for dysphoric mood and suicidal ideas. The patient is not nervous/anxious.    Vitals:   03/07/18 0827  BP: (!) 158/103  Pulse: 70  Resp: 18  SpO2: 97%  Weight: (!) 344 lb 2 oz (156.1 kg)  Height: '5\' 6"'  (1.676 m)   Wt Readings from Last 3 Encounters:  03/07/18 (!) 344 lb 2 oz (156.1 kg)  01/24/18 (!) 334 lb 8 oz (151.7 kg)  11/24/17 (!) 334 lb 2 oz (151.6 kg)   Lab Results  Component Value Date   CREATININE 1.39 (H) 08/05/2017   CREATININE 1.53 (H) 06/22/2017   CREATININE 1.28 (H) 11/25/2016    Physical Exam  Constitutional: He is oriented to person, place, and time. He appears well-developed and well-nourished.  HENT:  Head: Normocephalic and atraumatic.  Neck: Normal range of motion. Neck supple. No JVD present.  Cardiovascular: Normal rate and regular rhythm.  Pulmonary/Chest: Effort normal. He has no wheezes. He has no rales.  Abdominal: Soft. He exhibits no distension. There is no abdominal tenderness.  Musculoskeletal:        General: No tenderness or edema.  Neurological: He is alert and oriented to person, place, and time.  Skin: Skin is warm and dry.  Psychiatric: He has a normal mood and affect. His behavior is normal. Thought content normal.  Nursing note and vitals reviewed.   Assessment & Plan:  1:  Chronic heart failure with preserved ejection fraction- - NYHA class II - euvolemic - not weighing daily as he says that he doesn't have scales. Set of scales given to him and he was reminded to call for an overnight weight gain of >2 pounds or a weekly weight gain of >5 pounds - weight up 10 pounds from last visit 1 month ago - has resumed drinking soda and is also eating candy bars; sees how his weight has risen and says that he's going to cut the sodas and candy out again - continues to eat more home cooked meals and less fast food.  - not adding salt to his food and is reading food labels. Discussed the importance of closely following a 2080m sodium diet.  - has not gotten his flu vaccine for this season yet - PharmD reconciled  medications with the patient  2: HTN- - BP remains elevated but he really doesn't want to add another medication - asks that we wait 2 weeks and make his appointment later in the day so that he can go home after working all night and get some sleep before coming in - should his BP remain elevated in 2 weeks, he understands that we need to place him on spironolactone and that he's currently at a high risk of a stroke due to his HTN - suspect that this is multifactorial due to working at night, uncontrolled OSA, and stress related to work.  - has been taking entresto and carvedilol BID but BP remains elevated - BMP 08/05/17 reviewed and showed Na 142, K 3.5, GFR > 60, and Scr 1.39  3: Obstructive sleep apnea- - has his CPAP machine but states that he has not been using it because it needs cleaned and pressure reset - thinks his last sleep study was ~ 10 years ago - needs updated sleep study but he remains uninsured  Patient did not bring his medications nor a list. Each medication was verbally reviewed with the patient and he was encouraged to bring the bottles to every visit to confirm accuracy of list.  Return in 2 weeks or sooner for any questions/problems  before then.

## 2018-03-07 ENCOUNTER — Ambulatory Visit: Payer: Self-pay | Attending: Family | Admitting: Family

## 2018-03-07 ENCOUNTER — Encounter: Payer: Self-pay | Admitting: Family

## 2018-03-07 ENCOUNTER — Encounter: Payer: Self-pay | Admitting: Pharmacist

## 2018-03-07 VITALS — BP 158/103 | HR 70 | Resp 18 | Ht 66.0 in | Wt 344.1 lb

## 2018-03-07 DIAGNOSIS — Z79899 Other long term (current) drug therapy: Secondary | ICD-10-CM | POA: Insufficient documentation

## 2018-03-07 DIAGNOSIS — I10 Essential (primary) hypertension: Secondary | ICD-10-CM

## 2018-03-07 DIAGNOSIS — Z6841 Body Mass Index (BMI) 40.0 and over, adult: Secondary | ICD-10-CM | POA: Insufficient documentation

## 2018-03-07 DIAGNOSIS — Z8249 Family history of ischemic heart disease and other diseases of the circulatory system: Secondary | ICD-10-CM | POA: Insufficient documentation

## 2018-03-07 DIAGNOSIS — G4733 Obstructive sleep apnea (adult) (pediatric): Secondary | ICD-10-CM | POA: Insufficient documentation

## 2018-03-07 DIAGNOSIS — J45909 Unspecified asthma, uncomplicated: Secondary | ICD-10-CM | POA: Insufficient documentation

## 2018-03-07 DIAGNOSIS — I252 Old myocardial infarction: Secondary | ICD-10-CM | POA: Insufficient documentation

## 2018-03-07 DIAGNOSIS — I5042 Chronic combined systolic (congestive) and diastolic (congestive) heart failure: Secondary | ICD-10-CM | POA: Insufficient documentation

## 2018-03-07 DIAGNOSIS — I110 Hypertensive heart disease with heart failure: Secondary | ICD-10-CM | POA: Insufficient documentation

## 2018-03-07 DIAGNOSIS — Z809 Family history of malignant neoplasm, unspecified: Secondary | ICD-10-CM | POA: Insufficient documentation

## 2018-03-07 DIAGNOSIS — I5032 Chronic diastolic (congestive) heart failure: Secondary | ICD-10-CM

## 2018-03-07 NOTE — Progress Notes (Signed)
[place] - PHARMACIST COUNSELING NOTE  ADHERENCE ASSESSMENT  Adherence strategy: takes medications when leaving work and before going into work   Do you ever forget to take your medication? [] Yes (1) [x] No (0)  Do you ever skip doses due to side effects? [] Yes (1) [x] No (0)  Do you have trouble affording your medicines? [] Yes (1) [x] No (0)  Are you ever unable to pick up your medication due to transportation difficulties? [] Yes (1) [x] No (0)  Do you ever stop taking your medications because you don't believe they are helping? [] Yes (1) [x] No (0)  Total score _0______    Recommendations given to patient about increasing adherence: Works night shift. Takes medications when he gets off work in the morning and prior to going into work at night.  Guideline-Directed Medical Therapy/Evidence Based Medicine    ACE/ARB/ARNI: Entresto 97-103 mg twice daily   Beta Blocker: carvedilol 25 mg twice daily   Aldosterone Antagonist: none (holding per pt request) Diuretic: furosemide 40 mg daily   SUBJECTIVE   HPI: Here today for follow up appointment. No recent hospitalizations. Does not have a scale to weigh himself.  Past Medical History:  Diagnosis Date  . Acute kidney injury ([place])   . Asthma   . Chronic combined systolic and diastolic CHF (congestive heart failure) (HCC)    EF < 25% on Cath (09/17/2014)  . Gout   . Gout of big toe 09/16/2014  . H/O medication noncompliance   . Hypertensive heart disease   . Hypokalemia   . [facility])    Patient denies having had a MI  . NICM (nonischemic cardiomyopathy) ([place])    a. cardiac cath 09/2014 with normal coronary arteries, EF 20-25% with severe global HK; b. echo 08/2014: EF 30-35%, false tendon in LV aepx of no clinical sig, diffuse HK, GR1DD, aortic root 40 mm, trivial MR, trivial pericardial effusion posterior   . Obesity, Class III, BMI 40-49.9 (morbid obesity) ([place])   .  Sleep apnea    a. sleep study 05/2010; b. noncompliant with CPAP  . Ventral hernia    a. incarcerated omentum 06/10/2015       OBJECTIVE    Vital signs: HR 70, BP 158/103, weight (pounds) 344.2  ECHO: Date 08/25/16, EF 60-65%  Cath: Date 09/17/14, EF 20-25%  BMP Latest Ref Rng & Units 08/05/2017 06/22/2017 11/25/2016  Glucose 70 - 99 mg/dL 140(H) 99 133(H)  BUN 6 - 20 mg/dL 19 19 19   Creatinine 0.61 - 1.24 mg/dL 1.39(H) 1.53(H) 1.28(H)  Sodium 135 - 145 mmol/L 142 137 137  Potassium 3.5 - 5.1 mmol/L 3.5 3.6 3.7  Chloride 98 - 111 mmol/L 103 101 102  CO2 22 - 32 mmol/L 32 29 26  Calcium 8.9 - 10.3 mg/dL 8.5(L) 8.8(L) 8.4(L)    ASSESSMENT 43 year old male with HF, EF 60-65% in August 2018. He reports that he does not weigh himself daily because he does not have a scale. Patient works night shift and takes his medications every day when leaving work and before going back in. He does not take his potassium supplement because he prefers to eat bananas. BP has been elevated at past visits. Patient was only taking Entresto and carvedilol once daily. Since last appointment, he has been taking both medications twice daily. BP today is elevated. Discussed with patient the possibility of adding a medication or increasing his current medication, but he did not want to do so.  PLAN Recommend adding spironolactone. Patient did not wish to do so at this time and is going to follow up in 2 weeks. If BP remains elevated at that time, will add spironolactone. Scale provided to patient and educated on weighing himself daily and reporting any weight gain of 2 pounds overnight or 5 pounds in a week.    Time spent: 10 minutes  [place], Pharm.D. 03/07/2018 9:02 AM    Current Outpatient Medications:  .  carvedilol (COREG) 25 MG tablet, Take 1 tablet (25 mg total) by mouth 2 (two) times daily with a meal., Disp: 180 tablet, Rfl: 3 .  furosemide (LASIX) 40 MG tablet, TAKE 1 TABLET BY MOUTH ONCE  DAILY, Disp: 90 tablet, Rfl: 3 .  sacubitril-valsartan (ENTRESTO) 97-103 MG, Take 1 tablet by mouth 2 (two) times daily., Disp: 180 tablet, Rfl: 3   COUNSELING POINTS/CLINICAL PEARLS  Carvedilol (Goal: weight less than 85 kg is 25 mg BID, weight greater than 85 kg is 50 mg BID)  Patient should avoid activities requiring coordination until drug effects are realized, as drug may cause dizziness.  This drug may cause diarrhea, nausea, vomiting, arthralgia, back pain, myalgia, headache, vision disorder, erectile dysfunction, reduced libido, or fatigue.  Instruct patient to report signs/symptoms of adverse cardiovascular effects such as hypotension (especially in elderly patients), arrhythmias, syncope, palpitations, angina, or edema.  Drug may mask symptoms of hypoglycemia. Advise diabetic patients to carefully monitor blood sugar levels.  Patient should take drug with food.  Advise patient against sudden discontinuation of drug. Entresto (Goal: 97/103 mg twice daily)  Warn male patient to avoid pregnancy during therapy and to report a pregnancy to a physician.  Advise patient to report symptomatic hypotension.  Side effects may include hyperkalemia, cough, dizziness, or renal failure. Furosemide  Drug causes sun-sensitivity. Advise patient to use sunscreen and avoid tanning beds. Patient should avoid activities requiring coordination until drug effects are realized, as drug may cause dizziness, vertigo, or blurred vision. This drug may cause hyperglycemia, hyperuricemia, constipation, diarrhea, loss of appetite, nausea, vomiting, purpuric disorder, cramps, spasticity, asthenia, headache, paresthesia, or scaling eczema. Instruct patient to report unusual bleeding/bruising or signs/symptoms of hypotension, infection, pancreatitis, or ototoxicity (tinnitus, hearing impairment). Advise patient to report signs/symptoms of a severe skin reactions (flu-like symptoms, spreading red rash, or skin/mucous  membrane blistering) or erythema multiforme. Instruct patient to eat high-potassium foods during drug therapy, as directed by healthcare professional.  Patient should not drink alcohol while taking this drug.   DRUGS TO AVOID IN HEART FAILURE  Drug or Class Mechanism  Analgesics . NSAIDs . COX-2 inhibitors . Glucocorticoids  Sodium and water retention, increased systemic vascular resistance, decreased response to diuretics   Diabetes Medications . Metformin . Thiazolidinediones o Rosiglitazone (Avandia) o Pioglitazone (Actos) . DPP4 Inhibitors o Saxagliptin (Onglyza) o Sitagliptin (Januvia)   Lactic acidosis Possible calcium channel blockade   Unknown  Antiarrhythmics . Class I  o Flecainide o Disopyramide . Class III o Sotalol . Other o Dronedarone  Negative inotrope, proarrhythmic   Proarrhythmic, beta blockade  Negative inotrope  Antihypertensives . Alpha Blockers o Doxazosin . Calcium Channel Blockers o Diltiazem o Verapamil o Nifedipine . Central Alpha Adrenergics o Moxonidine . Peripheral Vasodilators o Minoxidil  Increases renin and aldosterone  Negative inotrope    Possible sympathetic withdrawal  Unknown  Anti-infective . Itraconazole . Amphotericin B  Negative inotrope Unknown  Hematologic . Anagrelide . Cilostazol   Possible inhibition of PD IV Inhibition of PD III causing  arrhythmias  Neurologic/Psychiatric . Stimulants . Anti-Seizure Drugs o Carbamazepine o Pregabalin . Antidepressants o Tricyclics o Citalopram . Parkinsons o Bromocriptine o Pergolide o Pramipexole . Antipsychotics o Clozapine . Antimigraine o Ergotamine o Methysergide . Appetite suppressants . Bipolar o Lithium  Peripheral alpha and beta agonist activity  Negative inotrope and chronotrope Calcium channel blockade  Negative inotrope, proarrhythmic Dose-dependent QT prolongation  Excessive serotonin activity/valvular damage Excessive  serotonin activity/valvular damage Unknown  IgE mediated hypersensitivy, calcium channel blockade  Excessive serotonin activity/valvular damage Excessive serotonin activity/valvular damage Valvular damage  Direct myofibrillar degeneration, adrenergic stimulation  Antimalarials . Chloroquine . Hydroxychloroquine Intracellular inhibition of lysosomal enzymes  Urologic Agents . Alpha Blockers o Doxazosin o Prazosin o Tamsulosin o Terazosin  Increased renin and aldosterone  Adapted from Page RL, et al. "Drugs That May Cause or Exacerbate Heart Failure: A Scientific Statement from the [employer]." Circulation 2016; 540:G86-P61. DOI: 10.1161/CIR.0000000000000426   MEDICATION ADHERENCES TIPS AND STRATEGIES 1. Taking medication as prescribed improves patient outcomes in heart failure (reduces hospitalizations, improves symptoms, increases survival) 2. Side effects of medications can be managed by decreasing doses, switching agents, stopping drugs, or adding additional therapy. Please let someone in the [facility] know if you have having bothersome side effects so we can modify your regimen. Do not alter your medication regimen without talking to [place].  3. Medication reminders can help patients remember to take drugs on time. If you are missing or forgetting doses you can try linking behaviors, using pill boxes, or an electronic reminder like an alarm on your phone or an app. Some people can also get automated phone calls as medication reminders.

## 2018-03-07 NOTE — Patient Instructions (Signed)
Continue weighing daily and call for an overnight weight gain of > 2 pounds or a weekly weight gain of >5 pounds. 

## 2018-03-24 ENCOUNTER — Other Ambulatory Visit: Payer: Self-pay

## 2018-03-24 ENCOUNTER — Encounter: Payer: Self-pay | Admitting: Family

## 2018-03-24 ENCOUNTER — Encounter: Payer: Self-pay | Admitting: Pharmacist

## 2018-03-24 ENCOUNTER — Ambulatory Visit: Payer: Self-pay | Attending: Family | Admitting: Family

## 2018-03-24 VITALS — BP 174/105 | HR 79 | Resp 18 | Ht 66.0 in | Wt 338.5 lb

## 2018-03-24 DIAGNOSIS — M79672 Pain in left foot: Secondary | ICD-10-CM | POA: Insufficient documentation

## 2018-03-24 DIAGNOSIS — E876 Hypokalemia: Secondary | ICD-10-CM | POA: Insufficient documentation

## 2018-03-24 DIAGNOSIS — Z8249 Family history of ischemic heart disease and other diseases of the circulatory system: Secondary | ICD-10-CM | POA: Insufficient documentation

## 2018-03-24 DIAGNOSIS — G4733 Obstructive sleep apnea (adult) (pediatric): Secondary | ICD-10-CM | POA: Insufficient documentation

## 2018-03-24 DIAGNOSIS — R0602 Shortness of breath: Secondary | ICD-10-CM | POA: Insufficient documentation

## 2018-03-24 DIAGNOSIS — I110 Hypertensive heart disease with heart failure: Secondary | ICD-10-CM | POA: Insufficient documentation

## 2018-03-24 DIAGNOSIS — I252 Old myocardial infarction: Secondary | ICD-10-CM | POA: Insufficient documentation

## 2018-03-24 DIAGNOSIS — Z79899 Other long term (current) drug therapy: Secondary | ICD-10-CM | POA: Insufficient documentation

## 2018-03-24 DIAGNOSIS — I5032 Chronic diastolic (congestive) heart failure: Secondary | ICD-10-CM

## 2018-03-24 DIAGNOSIS — I5042 Chronic combined systolic (congestive) and diastolic (congestive) heart failure: Secondary | ICD-10-CM | POA: Insufficient documentation

## 2018-03-24 DIAGNOSIS — J45909 Unspecified asthma, uncomplicated: Secondary | ICD-10-CM | POA: Insufficient documentation

## 2018-03-24 DIAGNOSIS — I10 Essential (primary) hypertension: Secondary | ICD-10-CM

## 2018-03-24 DIAGNOSIS — M109 Gout, unspecified: Secondary | ICD-10-CM | POA: Insufficient documentation

## 2018-03-24 DIAGNOSIS — Z7901 Long term (current) use of anticoagulants: Secondary | ICD-10-CM | POA: Insufficient documentation

## 2018-03-24 MED ORDER — AMLODIPINE BESYLATE 10 MG PO TABS: 10.0000 mg | ORAL_TABLET | Freq: Every day | ORAL | 5 refills | Status: DC

## 2018-03-24 NOTE — Progress Notes (Signed)
Patient ID: Noah Rodriguez, male    DOB: 04/19/1975, 43 y.o.   MRN: [id number]  HPI  Mr Brunke is a 43 y/o male with a history of obstructive sleep apnea, obesity, hypokalemia, gout, asthma and chronic heart failure.   Echo done 08/25/16 reviewed and shows an EF of 60-65%. Echo done 02/14/16 and showed an EF of 35-40% along with mild MR/TR. EF has improved slightly from August 2016.   Last cardiac catheterization was done 09/17/14 and showed an EF of 20-25% at that time. No CAD present.   Has not been admitted or been in the ED in the last 6 months.   He presents today for a follow-up visit with a chief complaint of minimal shortness of breath upon moderate exertion. He describes this as chronic in nature having been present for several years. He has associated head congestion, edema in left foot and gout pain in his left foot along with this. He denies any difficulty sleeping, abdominal distention, palpitations, chest pain, wheezing, cough, dizziness or weight gain.   Past Medical History:  Diagnosis Date  . Acute kidney injury (HCC)   . Asthma   . Chronic combined systolic and diastolic CHF (congestive heart failure) (H[facility])    EF < 25% on Cath (09/17/2014)  . Gout   . Gout of big toe 09/16/2014  . H/O medication noncompliance   . Hypertensive heart disease   . Hypokalemia   . MI (myocardial infarction) [facility])    Patient denies having had a MI  . NICM (nonischemic cardiomyopathy) (H[facility])    a. cardiac cath 09/2014 with normal coronary arteries, EF 20-25% with severe global HK; b. echo 08/2014: EF 30-35%, false tendon in LV aepx of no clinical sig, diffuse HK, GR1DD, aortic root 40 mm, trivial MR, trivial pericardial effusion posterior   . Obesity, Class III, BMI 40-49.9 (morbid obesity) (HCC)   . Sleep apnea    a. sleep study 05/2010; b. noncompliant with CPAP  . Ventral hernia    a. incarcerated omentum 06/10/2015   Past Surgical History:  Procedure Laterality Date  . CARDIAC  CATHETERIZATION N/A 09/17/2014   Procedure: Right/Left Heart Cath and Coronary Angiography;  Surgeon: Lennette Bihari, MD;  Location: [facility] INVASIVE CV LAB;  Service: Cardiovascular;  Laterality: N/A;  . VENTRAL HERNIA REPAIR N/A 06/11/2015   Procedure: HERNIA REPAIR VENTRAL ADULT;  Surgeon: Lattie Haw, MD;  Location: [facility] ORS;  Service: General;  Laterality: N/A;   Family History  Problem Relation Age of Onset  . HIV Mother   . Hypertension Mother   . Hypertension Other   . Hypertension Maternal Grandmother   . Hypertension Paternal Grandmother   . Alcohol abuse Neg Hx   . Cancer Neg Hx   . Early death Neg Hx   . Heart disease Neg Hx   . Hyperlipidemia Neg Hx   . Kidney disease Neg Hx   . Stroke Neg Hx   . Heart attack Neg Hx    Social History   Tobacco Use  . Smoking status: Never Smoker  . Smokeless tobacco: Never Used  Substance Use Topics  . Alcohol use: No   No Known Allergies  Prior to Admission medications   Medication Sig Start Date End Date Taking? Authorizing Provider  carvedilol (COREG) 25 MG tablet Take 1 tablet (25 mg total) by mouth 2 (two) times daily with a meal. 10/06/17  Yes Mate Alegria A, FNP  furosemide (LASIX) 40 MG tablet TAKE 1 TABLET BY  MOUTH ONCE DAILY 07/14/17  Yes Kileen Lange A, FNP  sacubitril-valsartan (ENTRESTO) 97-103 MG Take 1 tablet by mouth 2 (two) times daily. 06/22/17  Yes Delma Freeze, FNP    Review of Systems  Constitutional: Negative for appetite change and fatigue.  HENT: Positive for congestion. Negative for postnasal drip and sore throat.   Eyes: Negative for pain and visual disturbance.  Respiratory: Positive for shortness of breath (minimal). Negative for cough, chest tightness and wheezing.   Cardiovascular: Positive for leg swelling (left foot). Negative for chest pain and palpitations.  Gastrointestinal: Negative for abdominal distention, abdominal pain and constipation.  Endocrine: Negative.   Genitourinary: Negative.    Musculoskeletal: Positive for arthralgias (right to sore from gout). Negative for back pain and neck pain.  Skin: Negative.   Allergic/Immunologic: Negative.   Neurological: Negative for dizziness, weakness, light-headedness, numbness and headaches.  Hematological: Negative for adenopathy. Does not bruise/bleed easily.  Psychiatric/Behavioral: Negative for dysphoric mood, sleep disturbance (works night shift) and suicidal ideas. The patient is not nervous/anxious.    Vitals:   03/24/18 1315  BP: (!) 174/105  Pulse: 79  Resp: 18  SpO2: 97%  Weight: (!) 338 lb 8 oz (153.5 kg)  Height: 5\' 6"  (1.676 m)   Wt Readings from Last 3 Encounters:  03/24/18 (!) 338 lb 8 oz (153.5 kg)  03/07/18 (!) 344 lb 2 oz (156.1 kg)  01/24/18 (!) 334 lb 8 oz (151.7 kg)   Lab Results  Component Value Date   CREATININE 1.39 (H) 08/05/2017   CREATININE 1.53 (H) 06/22/2017   CREATININE 1.28 (H) 11/25/2016    Physical Exam  Constitutional: He is oriented to person, place, and time. He appears well-developed and well-nourished.  HENT:  Head: Normocephalic and atraumatic.  Neck: Normal range of motion. Neck supple. No JVD present.  Cardiovascular: Normal rate and regular rhythm.  Pulmonary/Chest: Effort normal. He has no wheezes. He has no rales.  Abdominal: Soft. He exhibits no distension. There is no abdominal tenderness.  Musculoskeletal:        General: No tenderness or edema.  Neurological: He is alert and oriented to person, place, and time.  Skin: Skin is warm and dry.  Psychiatric: He has a normal mood and affect. His behavior is normal. Thought content normal.  Nursing note and vitals reviewed.   Assessment & Plan:  1: Chronic heart failure with preserved ejection fraction- - NYHA class II - euvolemic - weighing daily;  reminded to call for an overnight weight gain of >2 pounds or a weekly weight gain of >5 pounds - weight down 6 pounds from last visit here 2 weeks ago - now back to  drinking water; still eating some candy - continues to eat more home cooked meals and less fast food.  - not adding salt to his food and is reading food labels. Discussed the importance of closely following a 2000mg  sodium diet.  - has not gotten his flu vaccine for this season yet - PharmD reconciled medications with the patient  2: HTN- - BP remains elevated - will add amlodipine 10mg  daily - suspect that this is multifactorial due to working at night, uncontrolled OSA, and stress related to work.  - BMP 08/05/17 reviewed and showed Na 142, K 3.5, GFR > 60, and Scr 1.39  3: Obstructive sleep apnea- - has his CPAP machine but states that he has not been using it because it needs cleaned and pressure reset - thinks his last sleep study was ~  10 years ago - needs updated sleep study but he remains uninsured  Patient did not bring his medications nor a list. Each medication was verbally reviewed with the patient and he was encouraged to bring the bottles to every visit to confirm accuracy of list.  Return in 5 weeks or sooner for any questions/problems before then.

## 2018-03-24 NOTE — Patient Instructions (Addendum)
Continue weighing daily and call for an overnight weight gain of > 2 pounds or a weekly weight gain of >5 pounds.   DASH Eating Plan DASH stands for "Dietary Approaches to Stop Hypertension." The DASH eating plan is a healthy eating plan that has been shown to reduce high blood pressure (hypertension). It may also reduce your risk for type 2 diabetes, heart disease, and stroke. The DASH eating plan may also help with weight loss. What are tips for following this plan?  General guidelines  Avoid eating more than 2,300 mg (milligrams) of salt (sodium) a day. If you have hypertension, you may need to reduce your sodium intake to 1,500 mg a day.  Limit alcohol intake to no more than 1 drink a day for nonpregnant women and 2 drinks a day for men. One drink equals 12 oz of beer, 5 oz of wine, or 1 oz of hard liquor.  Work with your health care provider to maintain a healthy body weight or to lose weight. Ask what an ideal weight is for you.  Get at least 30 minutes of exercise that causes your heart to beat faster (aerobic exercise) most days of the week. Activities may include walking, swimming, or biking.  Work with your health care provider or diet and nutrition specialist (dietitian) to adjust your eating plan to your individual calorie needs. Reading food labels   Check food labels for the amount of sodium per serving. Choose foods with less than 5 percent of the Daily Value of sodium. Generally, foods with less than 300 mg of sodium per serving fit into this eating plan.  To find whole grains, look for the word "whole" as the first word in the ingredient list. Shopping  Buy products labeled as "low-sodium" or "no salt added."  Buy fresh foods. Avoid canned foods and premade or frozen meals. Cooking  Avoid adding salt when cooking. Use salt-free seasonings or herbs instead of table salt or sea salt. Check with your health care provider or pharmacist before using salt substitutes.  Do  not fry foods. Cook foods using healthy methods such as baking, boiling, grilling, and broiling instead.  Cook with heart-healthy oils, such as olive, canola, soybean, or sunflower oil. Meal planning  Eat a balanced diet that includes: ? 5 or more servings of fruits and vegetables each day. At each meal, try to fill half of your plate with fruits and vegetables. ? Up to 6-8 servings of whole grains each day. ? Less than 6 oz of lean meat, poultry, or fish each day. A 3-oz serving of meat is about the same size as a deck of cards. One egg equals 1 oz. ? 2 servings of low-fat dairy each day. ? A serving of nuts, seeds, or beans 5 times each week. ? Heart-healthy fats. Healthy fats called Omega-3 fatty acids are found in foods such as flaxseeds and coldwater fish, like sardines, salmon, and mackerel.  Limit how much you eat of the following: ? Canned or prepackaged foods. ? Food that is high in trans fat, such as fried foods. ? Food that is high in saturated fat, such as fatty meat. ? Sweets, desserts, sugary drinks, and other foods with added sugar. ? Full-fat dairy products.  Do not salt foods before eating.  Try to eat at least 2 vegetarian meals each week.  Eat more home-cooked food and less restaurant, buffet, and fast food.  When eating at a restaurant, ask that your food be prepared with less  salt or no salt, if possible. What foods are recommended? The items listed may not be a complete list. Talk with your dietitian about what dietary choices are best for you. Grains Whole-grain or whole-wheat bread. Whole-grain or whole-wheat pasta. Brown rice. [place]. Bulgur. Whole-grain and low-sodium cereals. Pita bread. Low-fat, low-sodium crackers. Whole-wheat flour tortillas. Vegetables Fresh or frozen vegetables (raw, steamed, roasted, or grilled). Low-sodium or reduced-sodium tomato and vegetable juice. Low-sodium or reduced-sodium tomato sauce and tomato paste. Low-sodium or  reduced-sodium canned vegetables. Fruits All fresh, dried, or frozen fruit. Canned fruit in natural juice (without added sugar). Meat and other protein foods Skinless chicken or [place]. Ground chicken or [place]. Pork with fat trimmed off. Fish and seafood. Egg whites. Dried beans, peas, or lentils. Unsalted nuts, nut butters, and seeds. Unsalted canned beans. Lean cuts of beef with fat trimmed off. Low-sodium, lean deli meat. Dairy Low-fat (1%) or fat-free (skim) milk. Fat-free, low-fat, or reduced-fat cheeses. Nonfat, low-sodium ricotta or cottage cheese. Low-fat or nonfat yogurt. Low-fat, low-sodium cheese. Fats and oils Soft margarine without trans fats. Vegetable oil. Low-fat, reduced-fat, or light mayonnaise and salad dressings (reduced-sodium). Canola, safflower, olive, soybean, and sunflower oils. Avocado. Seasoning and other foods Herbs. Spices. Seasoning mixes without salt. Unsalted popcorn and pretzels. Fat-free sweets. What foods are not recommended? The items listed may not be a complete list. Talk with your dietitian about what dietary choices are best for you. Grains Baked goods made with fat, such as croissants, muffins, or some breads. Dry pasta or rice meal packs. Vegetables Creamed or fried vegetables. Vegetables in a cheese sauce. Regular canned vegetables (not low-sodium or reduced-sodium). Regular canned tomato sauce and paste (not low-sodium or reduced-sodium). Regular tomato and vegetable juice (not low-sodium or reduced-sodium). Angie Fava. Olives. Fruits Canned fruit in a light or heavy syrup. Fried fruit. Fruit in cream or butter sauce. Meat and other protein foods Fatty cuts of meat. Ribs. Fried meat. Berniece Salines. Sausage. Bologna and other processed lunch meats. Salami. Fatback. Hotdogs. Bratwurst. Salted nuts and seeds. Canned beans with added salt. Canned or smoked fish. Whole eggs or egg yolks. Chicken or [place] with skin. Dairy Whole or 2% milk, cream, and half-and-half.  Whole or full-fat cream cheese. Whole-fat or sweetened yogurt. Full-fat cheese. Nondairy creamers. Whipped toppings. Processed cheese and cheese spreads. Fats and oils Butter. Stick margarine. Lard. Shortening. Ghee. Bacon fat. Tropical oils, such as coconut, palm kernel, or palm oil. Seasoning and other foods Salted popcorn and pretzels. Onion salt, garlic salt, seasoned salt, table salt, and sea salt. Worcestershire sauce. Tartar sauce. Barbecue sauce. Teriyaki sauce. Soy sauce, including reduced-sodium. Steak sauce. Canned and packaged gravies. Fish sauce. Oyster sauce. Cocktail sauce. Horseradish that you find on the shelf. Ketchup. Mustard. Meat flavorings and tenderizers. Bouillon cubes. Hot sauce and Tabasco sauce. Premade or packaged marinades. Premade or packaged taco seasonings. Relishes. Regular salad dressings. Where to find more information:  National Heart, Lung, and [place]: https://wilson-eaton.com/  American Heart Association: www.heart.org Summary  The DASH eating plan is a healthy eating plan that has been shown to reduce high blood pressure (hypertension). It may also reduce your risk for type 2 diabetes, heart disease, and stroke.  With the DASH eating plan, you should limit salt (sodium) intake to 2,300 mg a day. If you have hypertension, you may need to reduce your sodium intake to 1,500 mg a day.  When on the DASH eating plan, aim to eat more fresh fruits and vegetables, whole grains, lean proteins, low-fat  dairy, and heart-healthy fats.  Work with your health care provider or diet and nutrition specialist (dietitian) to adjust your eating plan to your individual calorie needs. This information is not intended to replace advice given to you by your health care provider. Make sure you discuss any questions you have with your health care provider. Document Released: 12/17/2010 Document Revised: 12/22/2015 Document Reviewed: 12/22/2015 Elsevier Interactive Patient Education   2019 [employer].

## 2018-03-24 NOTE — Progress Notes (Signed)
[place] - PHARMACIST COUNSELING NOTE  ADHERENCE ASSESSMENT  Adherence strategy: takes medications when leaving work and before going into work   Do you ever forget to take your medication? [] Yes (1) [x] No (0)  Do you ever skip doses due to side effects? [] Yes (1) [x] No (0)  Do you have trouble affording your medicines? [] Yes (1) [x] No (0)  Are you ever unable to pick up your medication due to transportation difficulties? [] Yes (1) [x] No (0)  Do you ever stop taking your medications because you don't believe they are helping? [] Yes (1) [x] No (0)  Total score _0______    Recommendations given to patient about increasing adherence: None. Good compliance with regimen.  Guideline-Directed Medical Therapy/Evidence Based Medicine    ACE/ARB/ARNI: Delene Loll 97/103 mg twice daily   Beta Blocker: carvedilol 25 mg twice daily   Aldosterone Antagonist: none Diuretic: furosemide 40 mg daily    SUBJECTIVE   HPI: Here for follow up visit. He was here two weeks ago. BP was elevated but he wanted to hold off on starting a new medication and follow again in approximately 2 weeks.  Past Medical History:  Diagnosis Date  . Acute kidney injury ([place])   . Asthma   . Chronic combined systolic and diastolic CHF (congestive heart failure) (HCC)    EF < 25% on Cath (09/17/2014)  . Gout   . Gout of big toe 09/16/2014  . H/O medication noncompliance   . Hypertensive heart disease   . Hypokalemia   . MI (myocardial infarction) [facility])    Patient denies having had a MI  . NICM (nonischemic cardiomyopathy) ([place])    a. cardiac cath 09/2014 with normal coronary arteries, EF 20-25% with severe global HK; b. echo 08/2014: EF 30-35%, false tendon in LV aepx of no clinical sig, diffuse HK, GR1DD, aortic root 40 mm, trivial MR, trivial pericardial effusion posterior   . Obesity, Class III, BMI 40-49.9 (morbid obesity) ([place])   . Sleep apnea    a. sleep study 05/2010; b.  noncompliant with CPAP  . Ventral hernia    a. incarcerated omentum 06/10/2015        OBJECTIVE    Vital signs: HR 79, BP 174/105, weight (pounds) 338.8  ECHO: Date 08/25/16, EF 60-65%, notes: improved from previous ECHO   BMP Latest Ref Rng & Units 08/05/2017 06/22/2017 11/25/2016  Glucose 70 - 99 mg/dL 140(H) 99 133(H)  BUN 6 - 20 mg/dL 19 19 19   Creatinine 0.61 - 1.24 mg/dL 1.39(H) 1.53(H) 1.28(H)  Sodium 135 - 145 mmol/L 142 137 137  Potassium 3.5 - 5.1 mmol/L 3.5 3.6 3.7  Chloride 98 - 111 mmol/L 103 101 102  CO2 22 - 32 mmol/L 32 29 26  Calcium 8.9 - 10.3 mg/dL 8.5(L) 8.8(L) 8.4(L)    ASSESSMENT 43 year old male with HFpEF. EF 60-65% improved from 35-40%. Patient was here about 2 weeks ago and BP was elevated at last visit. Patient did not want to start a new medication and wanted to come back and see if BP had improved. BP today remains elevated. He has been taking medications as prescribed. Patient's fiance is with him at today's visit and is interested in ways to lower BP naturally.   PLAN We discussed the importance of diet and exercise to help reduce BP. Information on the DASH diet was given to the patient and his fiance. With improved EF, will target further BP control with amlodipine 10 mg daily as  this is a first line agent for BP control in African American patients. Patient to follow up with [facility] Clinic for continued assessment.    Time spent: 10 minutes  Violet, Pharm.D. 03/24/2018 1:30 PM    Current Outpatient Medications:  .  carvedilol (COREG) 25 MG tablet, Take 1 tablet (25 mg total) by mouth 2 (two) times daily with a meal., Disp: 180 tablet, Rfl: 3 .  furosemide (LASIX) 40 MG tablet, TAKE 1 TABLET BY MOUTH ONCE DAILY, Disp: 90 tablet, Rfl: 3 .  sacubitril-valsartan (ENTRESTO) 97-103 MG, Take 1 tablet by mouth 2 (two) times daily., Disp: 180 tablet, Rfl: 3   COUNSELING POINTS/CLINICAL PEARLS  Carvedilol (Goal: weight less than 85 kg is 25 mg  BID, weight greater than 85 kg is 50 mg BID)  Patient should avoid activities requiring coordination until drug effects are realized, as drug may cause dizziness.  This drug may cause diarrhea, nausea, vomiting, arthralgia, back pain, myalgia, headache, vision disorder, erectile dysfunction, reduced libido, or fatigue.  Instruct patient to report signs/symptoms of adverse cardiovascular effects such as hypotension (especially in elderly patients), arrhythmias, syncope, palpitations, angina, or edema.  Drug may mask symptoms of hypoglycemia. Advise diabetic patients to carefully monitor blood sugar levels.  Patient should take drug with food.  Advise patient against sudden discontinuation of drug. Entresto (Goal: 97/103 mg twice daily)  Warn male patient to avoid pregnancy during therapy and to report a pregnancy to a physician.  Advise patient to report symptomatic hypotension.  Side effects may include hyperkalemia, cough, dizziness, or renal failure. Furosemide  Drug causes sun-sensitivity. Advise patient to use sunscreen and avoid tanning beds. Patient should avoid activities requiring coordination until drug effects are realized, as drug may cause dizziness, vertigo, or blurred vision. This drug may cause hyperglycemia, hyperuricemia, constipation, diarrhea, loss of appetite, nausea, vomiting, purpuric disorder, cramps, spasticity, asthenia, headache, paresthesia, or scaling eczema. Instruct patient to report unusual bleeding/bruising or signs/symptoms of hypotension, infection, pancreatitis, or ototoxicity (tinnitus, hearing impairment). Advise patient to report signs/symptoms of a severe skin reactions (flu-like symptoms, spreading red rash, or skin/mucous membrane blistering) or erythema multiforme. Instruct patient to eat high-potassium foods during drug therapy, as directed by healthcare professional.  Patient should not drink alcohol while taking this drug.   DRUGS TO AVOID IN HEART  FAILURE  Drug or Class Mechanism  Analgesics . NSAIDs . COX-2 inhibitors . Glucocorticoids  Sodium and water retention, increased systemic vascular resistance, decreased response to diuretics   Diabetes Medications . Metformin . Thiazolidinediones o Rosiglitazone (Avandia) o Pioglitazone (Actos) . DPP4 Inhibitors o Saxagliptin (Onglyza) o Sitagliptin (Januvia)   Lactic acidosis Possible calcium channel blockade   Unknown  Antiarrhythmics . Class I  o Flecainide o Disopyramide . Class III o Sotalol . Other o Dronedarone  Negative inotrope, proarrhythmic   Proarrhythmic, beta blockade  Negative inotrope  Antihypertensives . Alpha Blockers o Doxazosin . Calcium Channel Blockers o Diltiazem o Verapamil o Nifedipine . Central Alpha Adrenergics o Moxonidine . Peripheral Vasodilators o Minoxidil  Increases renin and aldosterone  Negative inotrope    Possible sympathetic withdrawal  Unknown  Anti-infective . Itraconazole . Amphotericin B  Negative inotrope Unknown  Hematologic . Anagrelide . Cilostazol   Possible inhibition of PD IV Inhibition of PD III causing arrhythmias  Neurologic/Psychiatric . Stimulants . Anti-Seizure Drugs o Carbamazepine o Pregabalin . Antidepressants o Tricyclics o Citalopram . Parkinsons o Bromocriptine o Pergolide o Pramipexole . Antipsychotics o Clozapine . Antimigraine o Ergotamine o  Methysergide . Appetite suppressants . Bipolar o Lithium  Peripheral alpha and beta agonist activity  Negative inotrope and chronotrope Calcium channel blockade  Negative inotrope, proarrhythmic Dose-dependent QT prolongation  Excessive serotonin activity/valvular damage Excessive serotonin activity/valvular damage Unknown  IgE mediated hypersensitivy, calcium channel blockade  Excessive serotonin activity/valvular damage Excessive serotonin activity/valvular damage Valvular damage  Direct myofibrillar  degeneration, adrenergic stimulation  Antimalarials . Chloroquine . Hydroxychloroquine Intracellular inhibition of lysosomal enzymes  Urologic Agents . Alpha Blockers o Doxazosin o Prazosin o Tamsulosin o Terazosin  Increased renin and aldosterone  Adapted from Page RL, et al. "Drugs That May Cause or Exacerbate Heart Failure: A Scientific Statement from the [employer]." Circulation 2016; 569:V94-I01. DOI: 10.1161/CIR.0000000000000426   MEDICATION ADHERENCES TIPS AND STRATEGIES 1. Taking medication as prescribed improves patient outcomes in heart failure (reduces hospitalizations, improves symptoms, increases survival) 2. Side effects of medications can be managed by decreasing doses, switching agents, stopping drugs, or adding additional therapy. Please let someone in the [facility] know if you have having bothersome side effects so we can modify your regimen. Do not alter your medication regimen without talking to [place].  3. Medication reminders can help patients remember to take drugs on time. If you are missing or forgetting doses you can try linking behaviors, using pill boxes, or an electronic reminder like an alarm on your phone or an app. Some people can also get automated phone calls as medication reminders.

## 2018-03-25 ENCOUNTER — Encounter: Payer: Self-pay | Admitting: Family

## 2018-04-27 ENCOUNTER — Ambulatory Visit: Payer: Self-pay | Admitting: Family

## 2018-06-14 ENCOUNTER — Ambulatory Visit: Payer: Self-pay | Admitting: Family

## 2018-06-15 ENCOUNTER — Other Ambulatory Visit: Payer: Self-pay

## 2018-06-15 ENCOUNTER — Ambulatory Visit: Payer: Self-pay | Attending: Family | Admitting: Family

## 2018-06-15 ENCOUNTER — Encounter: Payer: Self-pay | Admitting: Family

## 2018-06-15 VITALS — BP 140/87 | HR 72 | Resp 18 | Ht 67.0 in | Wt 346.0 lb

## 2018-06-15 DIAGNOSIS — I5042 Chronic combined systolic (congestive) and diastolic (congestive) heart failure: Secondary | ICD-10-CM | POA: Insufficient documentation

## 2018-06-15 DIAGNOSIS — E876 Hypokalemia: Secondary | ICD-10-CM | POA: Insufficient documentation

## 2018-06-15 DIAGNOSIS — I10 Essential (primary) hypertension: Secondary | ICD-10-CM

## 2018-06-15 DIAGNOSIS — Z79899 Other long term (current) drug therapy: Secondary | ICD-10-CM | POA: Insufficient documentation

## 2018-06-15 DIAGNOSIS — M109 Gout, unspecified: Secondary | ICD-10-CM | POA: Insufficient documentation

## 2018-06-15 DIAGNOSIS — I110 Hypertensive heart disease with heart failure: Secondary | ICD-10-CM | POA: Insufficient documentation

## 2018-06-15 DIAGNOSIS — G4733 Obstructive sleep apnea (adult) (pediatric): Secondary | ICD-10-CM | POA: Insufficient documentation

## 2018-06-15 DIAGNOSIS — J45909 Unspecified asthma, uncomplicated: Secondary | ICD-10-CM | POA: Insufficient documentation

## 2018-06-15 DIAGNOSIS — R0789 Other chest pain: Secondary | ICD-10-CM | POA: Insufficient documentation

## 2018-06-15 DIAGNOSIS — Z6841 Body Mass Index (BMI) 40.0 and over, adult: Secondary | ICD-10-CM | POA: Insufficient documentation

## 2018-06-15 DIAGNOSIS — I5032 Chronic diastolic (congestive) heart failure: Secondary | ICD-10-CM

## 2018-06-15 DIAGNOSIS — I252 Old myocardial infarction: Secondary | ICD-10-CM | POA: Insufficient documentation

## 2018-06-15 DIAGNOSIS — Z8249 Family history of ischemic heart disease and other diseases of the circulatory system: Secondary | ICD-10-CM | POA: Insufficient documentation

## 2018-06-15 NOTE — Patient Instructions (Addendum)
Continue weighing daily and call for an overnight weight gain of > 2 pounds or a weekly weight gain of >5 pounds.  Make sure you take the entresto and carvedilol twice daily.

## 2018-06-15 NOTE — Progress Notes (Signed)
Patient ID: Noah Rodriguez, male    DOB: Sep 12, 1975, 43 y.o.   MRN: [id number]  HPI  Noah Rodriguez is Rodriguez 43 y/o male with Rodriguez history of obstructive sleep apnea, obesity, hypokalemia, gout, asthma and chronic heart failure.   Echo done 08/25/16 reviewed and shows an EF of 60-65%. Echo done 02/14/16 and showed an EF of 35-40% along with mild Noah/TR.    Last cardiac catheterization was done 09/17/14 and showed an EF of 20-25% at that time. No CAD present.   Has not been admitted or been in the ED in the last 6 months.   He presents today for Rodriguez follow-up visit with Rodriguez chief complaint of minimal shortness of breath upon moderate exertion. He describes this as chronic in nature having been present for several years. He has associated chest tightness and slight weight gain along with this. He denies any difficulty sleeping, abdominal distention, palpitations, pedal edema, chest pain, dizziness or fatigue. Has only been taking his entresto and carvedilol once daily. Admits to eating fast food daily due to work but on the weekend, he eats meals cooked at home. Eats at [employer] and [employer]Rodriguez and is aware that fast food will be high in sodium content.   Past Medical History:  Diagnosis Date  . Acute kidney injury (HCC)   . Asthma   . Chronic combined systolic and diastolic CHF (congestive heart failure) (HCC)    EF < 25% on Cath (09/17/2014)  . Gout   . Gout of big toe 09/16/2014  . H/O medication noncompliance   . Hypertensive heart disease   . Hypokalemia   . MI (myocardial infarction) [facility])    Patient denies having had Rodriguez MI  . NICM (nonischemic cardiomyopathy) (HCC)    Rodriguez. cardiac cath 09/2014 with normal coronary arteries, EF 20-25% with severe global HK; b. echo 08/2014: EF 30-35%, false tendon in LV aepx of no clinical sig, diffuse HK, GR1DD, aortic root 40 mm, trivial Noah, trivial pericardial effusion posterior   . Obesity, Class III, BMI 40-49.9 (morbid obesity) (HCC)   . Sleep apnea    Rodriguez. sleep  study 05/2010; b. noncompliant with CPAP  . Ventral hernia    Rodriguez. incarcerated omentum 06/10/2015   Past Surgical History:  Procedure Laterality Date  . CARDIAC CATHETERIZATION N/Rodriguez 09/17/2014   Procedure: Right/Left Heart Cath and Coronary Angiography;  Surgeon: Noah Bihari, MD;  Location: [facility] INVASIVE CV LAB;  Service: Cardiovascular;  Laterality: N/Rodriguez;  . VENTRAL HERNIA REPAIR N/Rodriguez 06/11/2015   Procedure: HERNIA REPAIR VENTRAL ADULT;  Surgeon: Noah Haw, MD;  Location: [facility] ORS;  Service: General;  Laterality: N/Rodriguez;   Family History  Problem Relation Age of Onset  . HIV Mother   . Hypertension Mother   . Hypertension Other   . Hypertension Maternal Grandmother   . Hypertension Paternal Grandmother   . Alcohol abuse Neg Hx   . Cancer Neg Hx   . Early death Neg Hx   . Heart disease Neg Hx   . Hyperlipidemia Neg Hx   . Kidney disease Neg Hx   . Stroke Neg Hx   . Heart attack Neg Hx    Social History   Tobacco Use  . Smoking status: Never Smoker  . Smokeless tobacco: Never Used  Substance Use Topics  . Alcohol use: No   No Known Allergies  Prior to Admission medications   Medication Sig Start Date End Date Taking? Authorizing Provider  amLODipine (NORVASC) 10 MG tablet  Take 1 tablet (10 mg total) by mouth daily. 03/24/18 06/22/18 Yes Noah Rodriguez  carvedilol (COREG) 25 MG tablet Take 1 tablet (25 mg total) by mouth 2 (two) times daily with Rodriguez meal. 10/06/17  Yes Noah Rodriguez  furosemide (LASIX) 40 MG tablet TAKE 1 TABLET BY MOUTH ONCE DAILY 07/14/17  Yes ,  Noah Rodriguez  sacubitril-valsartan (ENTRESTO) 97-103 MG Take 1 tablet by mouth 2 (two) times daily. 06/22/17  Yes Noah Rodriguez    Review of Systems  Constitutional: Negative for appetite change and fatigue.  HENT: Positive for congestion. Negative for postnasal drip and sore throat.   Eyes: Negative for pain and visual disturbance.  Respiratory: Positive for chest tightness (last week) and  shortness of breath (minimal). Negative for cough and wheezing.   Cardiovascular: Negative for chest pain, palpitations and leg swelling.  Gastrointestinal: Negative for abdominal distention, abdominal pain and constipation.  Endocrine: Negative.   Genitourinary: Negative.   Musculoskeletal: Negative for back pain and neck pain.  Skin: Negative.   Allergic/Immunologic: Negative.   Neurological: Negative for dizziness, weakness, light-headedness, numbness and headaches.  Hematological: Negative for adenopathy. Does not bruise/bleed easily.  Psychiatric/Behavioral: Negative for dysphoric mood, sleep disturbance (works night shift) and suicidal ideas. The patient is not nervous/anxious.    Vitals:   06/15/18 0832  BP: 140/87  Pulse: 72  Resp: 18  SpO2: 98%  Weight: (!) 346 lb (156.9 kg)  Height: 5\' 7"  (1.702 m)   Wt Readings from Last 3 Encounters:  06/15/18 (!) 346 lb (156.9 kg)  03/24/18 (!) 338 lb 8 oz (153.5 kg)  03/07/18 (!) 344 lb 2 oz (156.1 kg)   Lab Results  Component Value Date   CREATININE 1.39 (H) 08/05/2017   CREATININE 1.53 (H) 06/22/2017   CREATININE 1.28 (H) 11/25/2016    Physical Exam  Constitutional: He is oriented to person, place, and time. He appears well-developed and well-nourished.  HENT:  Head: Normocephalic and atraumatic.  Neck: Normal range of motion. Neck supple. No JVD present.  Cardiovascular: Normal rate and regular rhythm.  Pulmonary/Chest: Effort normal. He has no wheezes. He has no rales.  Abdominal: Soft. He exhibits no distension. There is no abdominal tenderness.  Musculoskeletal:        General: No tenderness or edema.  Neurological: He is alert and oriented to person, place, and time.  Skin: Skin is warm and dry.  Psychiatric: He has Rodriguez normal mood and affect. His behavior is normal. Thought content normal.  Nursing note and vitals reviewed.   Assessment & Plan:  1: Chronic heart failure with preserved ejection fraction- - NYHA  class II - euvolemic - weighing daily;  reminded to call for an overnight weight gain of >2 pounds or Rodriguez weekly weight gain of >5 pounds - weight up 8 pounds from last visit here 3 months ago - now back to drinking water although he says that he had been drinking tea again - encouraged to eat less fast food during the week - not adding salt to his food and is reading food labels. Discussed the importance of closely following Rodriguez 2000mg  sodium diet.  - reviewed the importance of taking his entresto and carvedilol twice daily; written instructions provided for this  2: HTN- - BP much better - amlodipine 10mg  added at last visit - suspect that this is multifactorial due to working at night, uncontrolled OSA, and stress related to work.  - BMP 08/05/17 reviewed and showed  Na 142, K 3.5, GFR > 60, and Scr 1.39   Medication bottles were reviewed.  Return in 6 months or sooner for any questions/problems before then.

## 2018-10-06 ENCOUNTER — Other Ambulatory Visit: Payer: Self-pay | Admitting: Family

## 2018-12-08 ENCOUNTER — Other Ambulatory Visit: Payer: Self-pay | Admitting: Family

## 2018-12-14 ENCOUNTER — Other Ambulatory Visit: Payer: Self-pay

## 2018-12-14 ENCOUNTER — Ambulatory Visit: Payer: Self-pay | Attending: Family | Admitting: Family

## 2018-12-14 ENCOUNTER — Encounter: Payer: Self-pay | Admitting: Family

## 2018-12-14 VITALS — BP 165/105 | HR 95 | Resp 18 | Ht 66.0 in | Wt 352.6 lb

## 2018-12-14 DIAGNOSIS — I110 Hypertensive heart disease with heart failure: Secondary | ICD-10-CM | POA: Insufficient documentation

## 2018-12-14 DIAGNOSIS — R0789 Other chest pain: Secondary | ICD-10-CM | POA: Insufficient documentation

## 2018-12-14 DIAGNOSIS — Z8249 Family history of ischemic heart disease and other diseases of the circulatory system: Secondary | ICD-10-CM | POA: Insufficient documentation

## 2018-12-14 DIAGNOSIS — I252 Old myocardial infarction: Secondary | ICD-10-CM | POA: Insufficient documentation

## 2018-12-14 DIAGNOSIS — I10 Essential (primary) hypertension: Secondary | ICD-10-CM

## 2018-12-14 DIAGNOSIS — F329 Major depressive disorder, single episode, unspecified: Secondary | ICD-10-CM | POA: Insufficient documentation

## 2018-12-14 DIAGNOSIS — R0602 Shortness of breath: Secondary | ICD-10-CM | POA: Insufficient documentation

## 2018-12-14 DIAGNOSIS — G4733 Obstructive sleep apnea (adult) (pediatric): Secondary | ICD-10-CM | POA: Insufficient documentation

## 2018-12-14 DIAGNOSIS — I5042 Chronic combined systolic (congestive) and diastolic (congestive) heart failure: Secondary | ICD-10-CM | POA: Insufficient documentation

## 2018-12-14 DIAGNOSIS — Z79899 Other long term (current) drug therapy: Secondary | ICD-10-CM | POA: Insufficient documentation

## 2018-12-14 DIAGNOSIS — J45909 Unspecified asthma, uncomplicated: Secondary | ICD-10-CM | POA: Insufficient documentation

## 2018-12-14 DIAGNOSIS — I5032 Chronic diastolic (congestive) heart failure: Secondary | ICD-10-CM

## 2018-12-14 NOTE — Patient Instructions (Addendum)
Continue weighing daily and call for an overnight weight gain of > 2 pounds or a weekly weight gain of >5 pounds.   Take entresto and carvedilo twice daily

## 2018-12-14 NOTE — Progress Notes (Signed)
Patient ID: Noah Rodriguez, male    DOB: 1975/05/27, 43 y.o.   MRN: [id number]  HPI  Mr Ruesga is a 43 y/o male with a history of obstructive sleep apnea, obesity, hypokalemia, gout, asthma and chronic heart failure.   Echo done 08/25/16 reviewed and shows an EF of 60-65%. Echo done 02/14/16 and showed an EF of 35-40% along with mild MR/TR.    Last cardiac catheterization was done 09/17/14 and showed an EF of 20-25% at that time. No CAD present.   Has not been admitted or been in the ED in the last 6 months.   He presents today for a follow-up visit with a chief complaint of minimal shortness of breath upon moderate exertion. He describes this as chronic in nature having been present for several years. He has associated chest tightness, head congestion, slight weight gain and occasional gagging when he eats. He denies any difficulty sleeping, dizziness, abdominal distention, palpitations, pedal edema, chest pain, wheezing or fatigue.  Becomes tearful in the office and says that he's feeling stressed about "everything". He doesn't endorse anything specific but admits that work and dealing with life with COVID is stressful. Does say that he has support people that he can talk to.   In reviewing his medications, he has resumed taking his carvedilol and entresto daily instead of twice daily as he was doing previously. He says that he's unsure why he hasn't been taking them twice daily.   Past Medical History:  Diagnosis Date  . Acute kidney injury (HCC)   . Asthma   . Chronic combined systolic and diastolic CHF (congestive heart failure) (HCC)    EF < 25% on Cath (09/17/2014)  . Gout   . Gout of big toe 09/16/2014  . H/O medication noncompliance   . Hypertensive heart disease   . Hypokalemia   . MI (myocardial infarction) [facility])    Patient denies having had a MI  . NICM (nonischemic cardiomyopathy) (HCC)    a. cardiac cath 09/2014 with normal coronary arteries, EF 20-25% with severe global  HK; b. echo 08/2014: EF 30-35%, false tendon in LV aepx of no clinical sig, diffuse HK, GR1DD, aortic root 40 mm, trivial MR, trivial pericardial effusion posterior   . Obesity, Class III, BMI 40-49.9 (morbid obesity) (HCC)   . Sleep apnea    a. sleep study 05/2010; b. noncompliant with CPAP  . Ventral hernia    a. incarcerated omentum 06/10/2015   Past Surgical History:  Procedure Laterality Date  . CARDIAC CATHETERIZATION N/A 09/17/2014   Procedure: Right/Left Heart Cath and Coronary Angiography;  Surgeon: Lennette Bihari, MD;  Location[facility] INVASIVE CV LAB;  Service: Cardiovascular;  Laterality: N/A;  . VENTRAL HERNIA REPAIR N/A 06/11/2015   Procedure: HERNIA REPAIR VENTRAL ADULT;  Surgeon: Lattie Haw, MD;  Location: [facility] ORS;  Service: General;  Laterality: N/A;   Family History  Problem Relation Age of Onset  . HIV Mother   . Hypertension Mother   . Hypertension Other   . Hypertension Maternal Grandmother   . Hypertension Paternal Grandmother   . Alcohol abuse Neg Hx   . Cancer Neg Hx   . Early death Neg Hx   . Heart disease Neg Hx   . Hyperlipidemia Neg Hx   . Kidney disease Neg Hx   . Stroke Neg Hx   . Heart attack Neg Hx    Social History   Tobacco Use  . Smoking status: Never Smoker  . Smokeless  tobacco: Never Used  Substance Use Topics  . Alcohol use: No   No Known Allergies  Prior to Admission medications   Medication Sig Start Date End Date Taking? Authorizing Provider  amLODipine (NORVASC) 10 MG tablet Take 1 tablet (10 mg total) by mouth daily. 03/24/18 12/14/18 Yes Clarisa Kindred A, FNP  carvedilol (COREG) 25 MG tablet TAKE 1 TABLET BY MOUTH TWICE DAILY WITH MEALS 12/08/18  Yes Clarisa Kindred A, FNP  furosemide (LASIX) 40 MG tablet Take 1 tablet by mouth once daily 10/06/18  Yes Caryssa Elzey A, FNP  sacubitril-valsartan (ENTRESTO) 97-103 MG Take 1 tablet by mouth 2 (two) times daily. 06/22/17  Yes Delma Freeze, FNP    Review of Systems  Constitutional:  Negative for appetite change and fatigue.  HENT: Positive for congestion. Negative for postnasal drip and sore throat.   Eyes: Negative for pain and visual disturbance.  Respiratory: Positive for chest tightness (at times) and shortness of breath (minimal). Negative for cough and wheezing.   Cardiovascular: Negative for chest pain, palpitations and leg swelling.  Gastrointestinal: Positive for nausea ("sometimes after eating"). Negative for abdominal distention, abdominal pain and constipation.  Endocrine: Negative.   Genitourinary: Negative.   Musculoskeletal: Negative for back pain and neck pain.  Skin: Negative.   Allergic/Immunologic: Negative.   Neurological: Negative for dizziness, weakness, light-headedness, numbness and headaches.  Hematological: Negative for adenopathy. Does not bruise/bleed easily.  Psychiatric/Behavioral: Positive for dysphoric mood. Negative for sleep disturbance (works night shift) and suicidal ideas.   Vitals:   12/14/18 0847  BP: (!) 165/105  Pulse: 95  Resp: 18  SpO2: 100%  Weight: (!) 352 lb 9.6 oz (159.9 kg)  Height: 5\' 6"  (1.676 m)   Wt Readings from Last 3 Encounters:  12/14/18 (!) 352 lb 9.6 oz (159.9 kg)  06/15/18 (!) 346 lb (156.9 kg)  03/24/18 (!) 338 lb 8 oz (153.5 kg)   Lab Results  Component Value Date   CREATININE 1.39 (H) 08/05/2017   CREATININE 1.53 (H) 06/22/2017   CREATININE 1.28 (H) 11/25/2016     Physical Exam  Constitutional: He is oriented to person, place, and time. He appears well-developed and well-nourished.  HENT:  Head: Normocephalic and atraumatic.  Neck: Normal range of motion. Neck supple. No JVD present.  Cardiovascular: Normal rate and regular rhythm.  Pulmonary/Chest: Effort normal. He has no wheezes. He has no rales.  Abdominal: Soft. He exhibits no distension. There is no abdominal tenderness.  Musculoskeletal:        General: No tenderness or edema.  Neurological: He is alert and oriented to person,  place, and time.  Skin: Skin is warm and dry.  Psychiatric: His behavior is normal. Thought content normal. He exhibits a depressed mood.  Tearful in the office at times  Nursing note and vitals reviewed.   Assessment & Plan:  1: Chronic heart failure with preserved ejection fraction- - NYHA class II - euvolemic - weighing daily;  reminded to call for an overnight weight gain of >2 pounds or a weekly weight gain of >5 pounds - weight up 6 pounds from last visit here 6 months ago; says that he's eating more and eating things he shouldn't like doritos - drinking water but says that he's not drinking enough and feels thirsty at times; explained that he needed to drink 60-64 ounces of fluid daily and the majority of that should be water - not adding salt to his food and is reading food labels. Discussed the importance  of closely following a 2000mg  sodium diet.  - is, again, only taking his carvedilol and entresto daily instead of twice daily; reviewed the importance and necessity of taking them both twice daily; written information provided and both bottles were marked with a star (per his request)  2: HTN- - BP elevated today but says that he's been taking entresto/carvedilol daily instead of twice daily (see above) - suspect that this is multifactorial due to working at night, uncontrolled OSA, stress related to work & him not taking his medications correctly.  - BMP 08/05/17 reviewed and showed Na 142, K 3.5, GFR > 60, and Scr 1.39 - check BMP next visit  3: Depression- - emotional support given - encouraged him to get a PCP to discuss further  - also encouraged him to talk to his support people    Medication bottles were reviewed.  Return in 2 months or sooner for any questions/problems before then.

## 2019-01-28 ENCOUNTER — Other Ambulatory Visit: Payer: Self-pay | Admitting: Family

## 2019-02-14 NOTE — Progress Notes (Signed)
Patient ID: Noah Rodriguez, male    DOB: [birth year], 44 y.o.   MRN: [id number]  HPI  Noah Rodriguez is a 44 y/o male with a history of obstructive sleep apnea, obesity, hypokalemia, gout, asthma and chronic heart failure.   Echo done 08/25/16 reviewed and shows an EF of 60-65%. Echo done 02/14/16 and showed an EF of 35-40% along with mild Noah/TR.    Last cardiac catheterization was done 09/17/14 and showed an EF of 20-25% at that time. No CAD present.   Has not been admitted or been in the ED in the last 6 months.   He presents today for a follow-up visit with a chief complaint of minimal shortness of breath upon moderate exertion. He describes this as chronic in nature having been present for several years. He has associated intermittent left arm pain and slight weight gain. He denies any difficulty sleeping (although works night shift), dizziness, abdominal distention, palpitations, pedal edema, chest pain, cough or fatigue.   Has had a couple of falls this week but denies being dizzy and thinks he's just not picking up his feet. Arm pain was prior to the falls and is located in the "bend of the arm". No numbness, tingling or radiation of pain.   Past Medical History:  Diagnosis Date  . Acute kidney injury ([place])   . Asthma   . Chronic combined systolic and diastolic CHF (congestive heart failure) (HCC)    EF < 25% on Cath (09/17/2014)  . Gout   . Gout of big toe 09/16/2014  . H/O medication noncompliance   . Hypertension   . Hypertensive heart disease   . Hypokalemia   . MI (myocardial infarction) [facility]    Patient denies having had a MI  . NICM (nonischemic cardiomyopathy) ([place])    a. cardiac cath 09/2014 with normal coronary arteries, EF 20-25% with severe global HK; b. echo 08/2014: EF 30-35%, false tendon in LV aepx of no clinical sig, diffuse HK, GR1DD, aortic root 40 mm, trivial Noah, trivial pericardial effusion posterior   . Obesity, Class III, BMI 40-49.9 (morbid obesity) ([place])   .  Sleep apnea    a. sleep study 05/2010; b. noncompliant with CPAP  . Ventral hernia    a. incarcerated omentum 06/10/2015   Past Surgical History:  Procedure Laterality Date  . CARDIAC CATHETERIZATION N/A 09/17/2014   Procedure: Right/Left Heart Cath and Coronary Angiography;  Surgeon: Troy Sine, MD;  Location: [facility];  Service: Cardiovascular;  Laterality: N/A;  . VENTRAL HERNIA REPAIR N/A 06/11/2015   Procedure: HERNIA REPAIR VENTRAL ADULT;  Surgeon: Florene Glen, MD;  Location: [facility] ORS;  Service: General;  Laterality: N/A;   Family History  Problem Relation Age of Onset  . HIV Mother   . Hypertension Mother   . Hypertension Other   . Hypertension Maternal Grandmother   . Hypertension Paternal Grandmother   . Alcohol abuse Neg Hx   . Cancer Neg Hx   . Early death Neg Hx   . Heart disease Neg Hx   . Hyperlipidemia Neg Hx   . Kidney disease Neg Hx   . Stroke Neg Hx   . Heart attack Neg Hx    Social History   Tobacco Use  . Smoking status: Never Smoker  . Smokeless tobacco: Never Used  Substance Use Topics  . Alcohol use: No   No Known Allergies  Prior to Admission medications   Medication Sig Start Date End Date Taking?  Authorizing Provider  amLODipine (NORVASC) 10 MG tablet Take 1 tablet by mouth once daily 01/28/19  Yes Viral Schramm, Inetta Fermo A, FNP  carvedilol (COREG) 25 MG tablet TAKE 1 TABLET BY MOUTH TWICE DAILY WITH MEALS 12/08/18  Yes Clarisa Kindred A, FNP  furosemide (LASIX) 40 MG tablet Take 1 tablet by mouth once daily 10/06/18  Yes Emali Heyward A, FNP  sacubitril-valsartan (ENTRESTO) 97-103 MG Take 1 tablet by mouth 2 (two) times daily. 06/22/17  Yes Delma Freeze, FNP     Review of Systems  Constitutional: Negative for appetite change and fatigue.  HENT: Negative for congestion, postnasal drip and sore throat.   Eyes: Negative for pain and visual disturbance.  Respiratory: Positive for shortness of breath (minimal). Negative for cough, chest  tightness and wheezing.   Cardiovascular: Negative for chest pain, palpitations and leg swelling.  Gastrointestinal: Negative for abdominal distention, abdominal pain and constipation.  Endocrine: Negative.   Genitourinary: Negative.   Musculoskeletal: Positive for arthralgias (left antecubital intermittent). Negative for back pain and neck pain.  Skin: Negative.   Allergic/Immunologic: Negative.   Neurological: Negative for dizziness, weakness, light-headedness, numbness and headaches.  Hematological: Negative for adenopathy. Does not bruise/bleed easily.  Psychiatric/Behavioral: Negative for dysphoric mood and sleep disturbance (works night shift). The patient is not nervous/anxious.    Vitals:   02/15/19 0823 02/15/19 0835  BP: (!) 161/98 (!) 142/80  Pulse: 81   Resp: 20   SpO2: 98%   Weight: (!) 354 lb 12.8 oz (160.9 kg)   Height: 5\' 7"  (1.702 m)    Wt Readings from Last 3 Encounters:  02/15/19 (!) 354 lb 12.8 oz (160.9 kg)  12/14/18 (!) 352 lb 9.6 oz (159.9 kg)  06/15/18 (!) 346 lb (156.9 kg)   Lab Results  Component Value Date   CREATININE 1.39 (H) 08/05/2017   CREATININE 1.53 (H) 06/22/2017   CREATININE 1.28 (H) 11/25/2016    Physical Exam  Constitutional: He is oriented to person, place, and time. He appears well-developed and well-nourished.  HENT:  Head: Normocephalic and atraumatic.  Neck: No JVD present.  Cardiovascular: Normal rate and regular rhythm.  Pulmonary/Chest: Effort normal. He has no wheezes. He has no rales.  Abdominal: Soft. He exhibits no distension. There is no abdominal tenderness.  Musculoskeletal:        General: No tenderness or edema.     Cervical back: Normal range of motion and neck supple.  Neurological: He is alert and oriented to person, place, and time.  Skin: Skin is warm and dry.  Psychiatric: He has a normal mood and affect. His behavior is normal. Thought content normal.  Nursing note and vitals reviewed.   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 Ate at [employer] on the way here this morning. Discussed the importance of closely following a 2000mg  sodium diet and trying to limit eating carbs right before going home to sleep.  - entresto and carvedilol were increased to BID at last visit & he's been taking them BID since last visit -  has not gotten a flu vaccine and still doesn't have any insurance  2: HTN- - BP initially elevated after walking here (161/98); rechecked with manual cuff at the end of the visit was better (142/80)  - suspect that this is multifactorial due to working at night, uncontrolled OSA, stress related to work  - [employer] 08/05/17 reviewed and showed Na 142, K 3.5, GFR > 60, and Scr 1.39 - check BMP today   Medication bottles were reviewed.  Return in 3 months or sooner for any questions/problems before then.

## 2019-02-15 ENCOUNTER — Ambulatory Visit: Payer: Self-pay | Attending: Family | Admitting: Family

## 2019-02-15 ENCOUNTER — Encounter: Payer: Self-pay | Admitting: Family

## 2019-02-15 ENCOUNTER — Other Ambulatory Visit: Payer: Self-pay

## 2019-02-15 VITALS — BP 142/80 | HR 81 | Resp 20 | Ht 67.0 in | Wt 354.8 lb

## 2019-02-15 DIAGNOSIS — I5032 Chronic diastolic (congestive) heart failure: Secondary | ICD-10-CM

## 2019-02-15 DIAGNOSIS — Z8249 Family history of ischemic heart disease and other diseases of the circulatory system: Secondary | ICD-10-CM | POA: Insufficient documentation

## 2019-02-15 DIAGNOSIS — J45909 Unspecified asthma, uncomplicated: Secondary | ICD-10-CM | POA: Insufficient documentation

## 2019-02-15 DIAGNOSIS — Z79899 Other long term (current) drug therapy: Secondary | ICD-10-CM | POA: Insufficient documentation

## 2019-02-15 DIAGNOSIS — Z6841 Body Mass Index (BMI) 40.0 and over, adult: Secondary | ICD-10-CM | POA: Insufficient documentation

## 2019-02-15 DIAGNOSIS — G4733 Obstructive sleep apnea (adult) (pediatric): Secondary | ICD-10-CM | POA: Insufficient documentation

## 2019-02-15 DIAGNOSIS — I110 Hypertensive heart disease with heart failure: Secondary | ICD-10-CM | POA: Insufficient documentation

## 2019-02-15 DIAGNOSIS — I10 Essential (primary) hypertension: Secondary | ICD-10-CM

## 2019-02-15 DIAGNOSIS — I5042 Chronic combined systolic (congestive) and diastolic (congestive) heart failure: Secondary | ICD-10-CM | POA: Insufficient documentation

## 2019-02-15 LAB — BASIC METABOLIC PANEL
Anion gap: 9 (ref 5–15)
BUN: 19 mg/dL (ref 6–20)
CO2: 28 mmol/L (ref 22–32)
Calcium: 8.9 mg/dL (ref 8.9–10.3)
Chloride: 102 mmol/L (ref 98–111)
Creatinine, Ser: 1.3 mg/dL — ABNORMAL HIGH (ref 0.61–1.24)
GFR calc Af Amer: >60 mL/min (ref >60)
GFR calc non Af Amer: >60 mL/min (ref >60)
Glucose, Bld: 125 mg/dL — ABNORMAL HIGH (ref 70–99)
Potassium: 4 mmol/L (ref 3.5–5.1)
Sodium: 139 mmol/L (ref 135–145)

## 2019-02-15 NOTE — Patient Instructions (Signed)
Continue weighing daily and call for an overnight weight gain of > 2 pounds or a weekly weight gain of >5 pounds. 

## 2019-05-10 NOTE — Progress Notes (Signed)
Patient ID: Noah Rodriguez, male    DOB: 05/27/1975, 44 y.o.   MRN: [id number]  HPI  Noah Rodriguez is a 44 y/o male with a history of obstructive sleep apnea, obesity, hypokalemia, gout, asthma and chronic heart failure.   Echo done 08/25/16 reviewed and shows an EF of 60-65%. Echo done 02/14/16 and showed an EF of 35-40% along with mild Noah/TR.    Last cardiac catheterization was done 09/17/14 and showed an EF of 20-25% at that time. No CAD present.   Has not been admitted or been in the ED in the last 6 months.   He presents today for a follow-up visit with a chief complaint of minimal shortness of breath upon moderate exertion. He describes this as chronic in nature having been present for several years although he does feel like it's improving. He says that he's had to be more active at work so is doing a lot of walking and lifting. He denies other symptoms and specifically denies any difficulty sleeping, dizziness, abdominal distention, palpitations, pedal edema, chest pain, cough or fatigue.   Hasn't been consistently weighing himself and has been drinking more pepsi's than he used to. Still has no insurance and hasn't gotten established with a PCP yet.   Past Medical History:  Diagnosis Date  . Acute kidney injury (HCC)   . Asthma   . Chronic combined systolic and diastolic CHF (congestive heart failure) (HCC)    EF < 25% on Cath (09/17/2014)  . Gout   . Gout of big toe 09/16/2014  . H/O medication noncompliance   . Hypertension   . Hypertensive heart disease   . Hypokalemia   . MI (myocardial infarction) [facility])    Patient denies having had a MI  . NICM (nonischemic cardiomyopathy) (HCC)    a. cardiac cath 09/2014 with normal coronary arteries, EF 20-25% with severe global HK; b. echo 08/2014: EF 30-35%, false tendon in LV aepx of no clinical sig, diffuse HK, GR1DD, aortic root 40 mm, trivial Noah, trivial pericardial effusion posterior   . Obesity, Class III, BMI 40-49.9 (morbid obesity)  (HCC)   . Sleep apnea    a. sleep study 05/2010; b. noncompliant with CPAP  . Ventral hernia    a. incarcerated omentum 06/10/2015   Past Surgical History:  Procedure Laterality Date  . CARDIAC CATHETERIZATION N/A 09/17/2014   Procedure: Right/Left Heart Cath and Coronary Angiography;  Surgeon: Lennette Bihari, MD;  Location: [facility];  Service: Cardiovascular;  Laterality: N/A;  . VENTRAL HERNIA REPAIR N/A 06/11/2015   Procedure: HERNIA REPAIR VENTRAL ADULT;  Surgeon: Lattie Haw, MD;  Location: [facility] ORS;  Service: General;  Laterality: N/A;   Family History  Problem Relation Age of Onset  . HIV Mother   . Hypertension Mother   . Hypertension Other   . Hypertension Maternal Grandmother   . Hypertension Paternal Grandmother   . Alcohol abuse Neg Hx   . Cancer Neg Hx   . Early death Neg Hx   . Heart disease Neg Hx   . Hyperlipidemia Neg Hx   . Kidney disease Neg Hx   . Stroke Neg Hx   . Heart attack Neg Hx    Social History   Tobacco Use  . Smoking status: Never Smoker  . Smokeless tobacco: Never Used  Substance Use Topics  . Alcohol use: No   No Known Allergies  Prior to Admission medications   Medication Sig Start Date End Date Taking?  Authorizing Provider  amLODipine (NORVASC) 10 MG tablet Take 1 tablet by mouth once daily 01/28/19  Yes Chayna Surratt, Otila Kluver A, FNP  carvedilol (COREG) 25 MG tablet TAKE 1 TABLET BY MOUTH TWICE DAILY WITH MEALS 12/08/18  Yes Darylene Price A, FNP  furosemide (LASIX) 40 MG tablet Take 1 tablet by mouth once daily 10/06/18  Yes Wisdom Rickey A, FNP  sacubitril-valsartan (ENTRESTO) 97-103 MG Take 1 tablet by mouth 2 (two) times daily. 06/22/17  Yes Alisa Graff, FNP    Review of Systems  Constitutional: Negative for appetite change and fatigue.  HENT: Negative for congestion, postnasal drip and sore throat.   Eyes: Negative for pain and visual disturbance.  Respiratory: Positive for shortness of breath (minimal). Negative for cough,  chest tightness and wheezing.   Cardiovascular: Negative for chest pain, palpitations and leg swelling.  Gastrointestinal: Negative for abdominal distention, abdominal pain and constipation.  Endocrine: Negative.   Genitourinary: Negative.   Musculoskeletal: Negative for arthralgias, back pain and neck pain.  Skin: Negative.   Allergic/Immunologic: Negative.   Neurological: Negative for dizziness, weakness, light-headedness, numbness and headaches.  Hematological: Negative for adenopathy. Does not bruise/bleed easily.  Psychiatric/Behavioral: Negative for dysphoric mood and sleep disturbance (works night shift). The patient is not nervous/anxious.    Vitals:   05/15/19 0826  BP: (!) 134/93  Pulse: 84  Resp: 18  SpO2: 99%  Weight: (!) 350 lb (158.8 kg)  Height: 5\' 7"  (1.702 m)   Wt Readings from Last 3 Encounters:  05/15/19 (!) 350 lb (158.8 kg)  02/15/19 (!) 354 lb 12.8 oz (160.9 kg)  12/14/18 (!) 352 lb 9.6 oz (159.9 kg)   Lab Results  Component Value Date   CREATININE 1.30 (H) 02/15/2019   CREATININE 1.39 (H) 08/05/2017   CREATININE 1.53 (H) 06/22/2017     Physical Exam  Constitutional: He is oriented to person, place, and time. He appears well-developed and well-nourished.  HENT:  Head: Normocephalic and atraumatic.  Neck: No JVD present.  Cardiovascular: Normal rate and regular rhythm.  Pulmonary/Chest: Effort normal. He has no wheezes. He has no rales.  Abdominal: Soft. He exhibits no distension. There is no abdominal tenderness.  Musculoskeletal:        General: No tenderness or edema.     Cervical back: Normal range of motion and neck supple.  Neurological: He is alert and oriented to person, place, and time.  Skin: Skin is warm and dry.  Psychiatric: He has a normal mood and affect. His behavior is normal. Thought content normal.  Nursing note and vitals reviewed.   Assessment & Plan:  1: Chronic heart failure with preserved ejection fraction- - NYHA class  II - euvolemic - not weighing daily & he was encouraged to resume; reminded to call for an overnight weight gain of >2 pounds or a weekly weight gain of >5 pounds - weight down 4.8 pounds from last visit here 3 months ago - has resumed drinking more pepsi instead of water and he is aware of sugar content of soda (does not like the taste of diet soda) - has become more active at work - discussed that we really need to update his echo (last one done 2018) but he doesn't want to schedule it right now as he still remains uninsured - not adding salt to his food and is reading food labels, however, 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  - PharmD reconciled medications with the patient  2: HTN- -  BP mildly elevated today - emphasized that he needed to have a PCP appointment scheduled by the next time he returns; options were given to him since he doesn't have insurance (he says that he makes too much money to go to [facility]) - suspect that this is multifactorial due to working at night, uncontrolled OSA, stress related to work  - [employer] 02/15/19 reviewed and showed Na 139, K 4.0, GFR > 60, and Scr 1.3    Medication bottles were reviewed.  Return in 3 months or sooner for any questions/problems before then.

## 2019-05-15 ENCOUNTER — Ambulatory Visit: Payer: Self-pay | Attending: Family | Admitting: Family

## 2019-05-15 ENCOUNTER — Encounter: Payer: Self-pay | Admitting: Family

## 2019-05-15 ENCOUNTER — Other Ambulatory Visit: Payer: Self-pay

## 2019-05-15 VITALS — BP 134/93 | HR 84 | Resp 18 | Ht 67.0 in | Wt 350.0 lb

## 2019-05-15 DIAGNOSIS — I110 Hypertensive heart disease with heart failure: Secondary | ICD-10-CM | POA: Insufficient documentation

## 2019-05-15 DIAGNOSIS — Z79899 Other long term (current) drug therapy: Secondary | ICD-10-CM | POA: Insufficient documentation

## 2019-05-15 DIAGNOSIS — J45909 Unspecified asthma, uncomplicated: Secondary | ICD-10-CM | POA: Insufficient documentation

## 2019-05-15 DIAGNOSIS — I10 Essential (primary) hypertension: Secondary | ICD-10-CM

## 2019-05-15 DIAGNOSIS — G4733 Obstructive sleep apnea (adult) (pediatric): Secondary | ICD-10-CM | POA: Insufficient documentation

## 2019-05-15 DIAGNOSIS — I5032 Chronic diastolic (congestive) heart failure: Secondary | ICD-10-CM

## 2019-05-15 DIAGNOSIS — Z6841 Body Mass Index (BMI) 40.0 and over, adult: Secondary | ICD-10-CM | POA: Insufficient documentation

## 2019-05-15 DIAGNOSIS — Z8249 Family history of ischemic heart disease and other diseases of the circulatory system: Secondary | ICD-10-CM | POA: Insufficient documentation

## 2019-05-15 DIAGNOSIS — I5042 Chronic combined systolic (congestive) and diastolic (congestive) heart failure: Secondary | ICD-10-CM | POA: Insufficient documentation

## 2019-05-15 DIAGNOSIS — R0602 Shortness of breath: Secondary | ICD-10-CM | POA: Insufficient documentation

## 2019-05-15 DIAGNOSIS — I252 Old myocardial infarction: Secondary | ICD-10-CM | POA: Insufficient documentation

## 2019-05-15 DIAGNOSIS — Z9119 Patient's noncompliance with other medical treatment and regimen: Secondary | ICD-10-CM | POA: Insufficient documentation

## 2019-05-15 NOTE — Progress Notes (Signed)
[place] - PHARMACIST COUNSELING NOTE  ADHERENCE ASSESSMENT  Adherence strategy: Patient takes medications from pill bottles   Do you ever forget to take your medication? [] Yes (1) [x] No (0)  Do you ever skip doses due to side effects? [] Yes (1) [x] No (0)  Do you have trouble affording your medicines? [] Yes (1) [x] No (0)  Are you ever unable to pick up your medication due to transportation difficulties? [] Yes (1) [x] No (0)  Do you ever stop taking your medications because you don't believe they are helping? [] Yes (1) [x] No (0)  Total score 0   Recommendations given to patient about increasing adherence: none, patient reports taking medications as prescribed  Guideline-Directed Medical Therapy/Evidence Based Medicine  ACE/ARB/ARNI: Entresto 97-103 BID Beta Blocker: Carvedilol 25 mg BIDM Aldosterone Antagonist: None Diuretic: Furosemide 40 mg daily    SUBJECTIVE   Past Medical History:  Diagnosis Date  . Acute kidney injury ([place])   . Asthma   . Chronic combined systolic and diastolic CHF (congestive heart failure) (HCC)    EF < 25% on Cath (09/17/2014)  . Gout   . Gout of big toe 09/16/2014  . H/O medication noncompliance   . Hypertension   . Hypertensive heart disease   . Hypokalemia   . MI (myocardial infarction) [facility])    Patient denies having had a MI  . NICM (nonischemic cardiomyopathy) ([place])    a. cardiac cath 09/2014 with normal coronary arteries, EF 20-25% with severe global HK; b. echo 08/2014: EF 30-35%, false tendon in LV aepx of no clinical sig, diffuse HK, GR1DD, aortic root 40 mm, trivial MR, trivial pericardial effusion posterior   . Obesity, Class III, BMI 40-49.9 (morbid obesity) ([place])   . Sleep apnea    a. sleep study 05/2010; b. noncompliant with CPAP  . Ventral hernia    a. incarcerated omentum 06/10/2015        OBJECTIVE   Vital signs: HR 84, BP 134/93, weight (pounds) 350 ECHO: Date 08/25/2016, EF  60-65% , notes: normal study Cath: Date 09/17/2014, EF 20-25%, notes Elevated right heart pressures with mild pHTN. Left ventricular dilation with severe global hypo-contractility. Normal coronaries  BMP Latest Ref Rng & Units 02/15/2019 08/05/2017 06/22/2017  Glucose 70 - 99 mg/dL 125(H) 140(H) 99  BUN 6 - 20 mg/dL 19 19 19   Creatinine 0.61 - 1.24 mg/dL 1.30(H) 1.39(H) 1.53(H)  Sodium 135 - 145 mmol/L 139 142 137  Potassium 3.5 - 5.1 mmol/L 4.0 3.5 3.6  Chloride 98 - 111 mmol/L 102 103 101  CO2 22 - 32 mmol/L 28 32 29  Calcium 8.9 - 10.3 mg/dL 8.9 8.5(L) 8.8(L)    ASSESSMENT  44 y/o M with history of hypertension, CHF, obesity, gout, asthma presenting to HF clinic for management. Patient reports he has gained weight over last year. He is not checking BP or weighing himself consistently at home. Reports breathing has been stable but endorses shortness of breath with long distances. Denies feelings of low blood pressure (light-headedness, dizziness). Endorses symptoms of high blood pressure including blurry vision.   PLAN  -Continue current medications -Increase frequency of checking blood pressure at home and taking weights -Lifestyle interventions including increasing exercise and watching caloric intake   Time spent: 15 minutes  [place] Resident 05/15/2019 8:45 AM    Current Outpatient Medications:  .  amLODipine (NORVASC) 10 MG tablet, Take 1 tablet by mouth once daily, Disp: 90 tablet, Rfl: 3 .  carvedilol (  COREG) 25 MG tablet, TAKE 1 TABLET BY MOUTH TWICE DAILY WITH MEALS, Disp: 180 tablet, Rfl: 3 .  furosemide (LASIX) 40 MG tablet, Take 1 tablet by mouth once daily, Disp: 90 tablet, Rfl: 3 .  sacubitril-valsartan (ENTRESTO) 97-103 MG, Take 1 tablet by mouth 2 (two) times daily., Disp: 180 tablet, Rfl: 3   COUNSELING POINTS/CLINICAL PEARLS   Carvedilol (Goal: weight less than 85 kg is 25 mg BID, weight greater than 85 kg is 50 mg BID)  Patient should avoid  activities requiring coordination until drug effects are realized, as drug may cause dizziness.  This drug may cause diarrhea, nausea, vomiting, arthralgia, back pain, myalgia, headache, vision disorder, erectile dysfunction, reduced libido, or fatigue.  Instruct patient to report signs/symptoms of adverse cardiovascular effects such as hypotension (especially in elderly patients), arrhythmias, syncope, palpitations, angina, or edema.  Drug may mask symptoms of hypoglycemia. Advise diabetic patients to carefully monitor blood sugar levels.  Patient should take drug with food.  Advise patient against sudden discontinuation of drug. Entresto (Goal: 97/103 mg twice daily)  Warn male patient to avoid pregnancy during therapy and to report a pregnancy to a physician.  Advise patient to report symptomatic hypotension.  Side effects may include hyperkalemia, cough, dizziness, or renal failure. Furosemide  Drug causes sun-sensitivity. Advise patient to use sunscreen and avoid tanning beds. Patient should avoid activities requiring coordination until drug effects are realized, as drug may cause dizziness, vertigo, or blurred vision. This drug may cause hyperglycemia, hyperuricemia, constipation, diarrhea, loss of appetite, nausea, vomiting, purpuric disorder, cramps, spasticity, asthenia, headache, paresthesia, or scaling eczema. Instruct patient to report unusual bleeding/bruising or signs/symptoms of hypotension, infection, pancreatitis, or ototoxicity (tinnitus, hearing impairment). Advise patient to report signs/symptoms of a severe skin reactions (flu-like symptoms, spreading red rash, or skin/mucous membrane blistering) or erythema multiforme. Instruct patient to eat high-potassium foods during drug therapy, as directed by healthcare professional.  Patient should not drink alcohol while taking this drug.   DRUGS TO AVOID IN HEART FAILURE  Drug or Class Mechanism  Analgesics . NSAIDs . COX-2  inhibitors . Glucocorticoids  Sodium and water retention, increased systemic vascular resistance, decreased response to diuretics   Diabetes Medications . Metformin . Thiazolidinediones o Rosiglitazone (Avandia) o Pioglitazone (Actos) . DPP4 Inhibitors o Saxagliptin (Onglyza) o Sitagliptin (Januvia)   Lactic acidosis Possible calcium channel blockade   Unknown  Antiarrhythmics . Class I  o Flecainide o Disopyramide . Class III o Sotalol . Other o Dronedarone  Negative inotrope, proarrhythmic   Proarrhythmic, beta blockade  Negative inotrope  Antihypertensives . Alpha Blockers o Doxazosin . Calcium Channel Blockers o Diltiazem o Verapamil o Nifedipine . Central Alpha Adrenergics o Moxonidine . Peripheral Vasodilators o Minoxidil  Increases renin and aldosterone  Negative inotrope    Possible sympathetic withdrawal  Unknown  Anti-infective . Itraconazole . Amphotericin B  Negative inotrope Unknown  Hematologic . Anagrelide . Cilostazol   Possible inhibition of PD IV Inhibition of PD III causing arrhythmias  Neurologic/Psychiatric . Stimulants . Anti-Seizure Drugs o Carbamazepine o Pregabalin . Antidepressants o Tricyclics o Citalopram . Parkinsons o Bromocriptine o Pergolide o Pramipexole . Antipsychotics o Clozapine . Antimigraine o Ergotamine o Methysergide . Appetite suppressants . Bipolar o Lithium  Peripheral alpha and beta agonist activity  Negative inotrope and chronotrope Calcium channel blockade  Negative inotrope, proarrhythmic Dose-dependent QT prolongation  Excessive serotonin activity/valvular damage Excessive serotonin activity/valvular damage Unknown  IgE mediated hypersensitivy, calcium channel blockade  Excessive serotonin activity/valvular damage Excessive  serotonin activity/valvular damage Valvular damage  Direct myofibrillar degeneration, adrenergic stimulation   Antimalarials . Chloroquine . Hydroxychloroquine Intracellular inhibition of lysosomal enzymes  Urologic Agents . Alpha Blockers o Doxazosin o Prazosin o Tamsulosin o Terazosin  Increased renin and aldosterone  Adapted from Page RL, et al. "Drugs That May Cause or Exacerbate Heart Failure: A Scientific Statement from the [place]." Circulation 2016; 242:A83-M19. DOI: 10.1161/CIR.0000000000000426   MEDICATION ADHERENCES TIPS AND STRATEGIES 1. Taking medication as prescribed improves patient outcomes in heart failure (reduces hospitalizations, improves symptoms, increases survival) 2. Side effects of medications can be managed by decreasing doses, switching agents, stopping drugs, or adding additional therapy. Please let someone in the [facility] know if you have having bothersome side effects so we can modify your regimen. Do not alter your medication regimen without talking to [place].  3. Medication reminders can help patients remember to take drugs on time. If you are missing or forgetting doses you can try linking behaviors, using pill boxes, or an electronic reminder like an alarm on your phone or an app. Some people can also get automated phone calls as medication reminders.

## 2019-05-15 NOTE — Patient Instructions (Addendum)
Continue weighing daily and call for an overnight weight gain of > 2 pounds or a weekly weight gain of >5 pounds.   Call the following to get established for primary care:  [facility]

## 2019-08-14 NOTE — Progress Notes (Signed)
Patient ID: Noah Rodriguez, male    DOB: [birth year], 44 y.o.   MRN: [id number]  HPI  Noah Rodriguez is Rodriguez 44 y/o male with Rodriguez history of obstructive sleep apnea, obesity, hypokalemia, gout, asthma and chronic heart failure.   Echo done 08/25/16 reviewed and shows an EF of 60-65%. Echo done 02/14/16 and showed an EF of 35-40% along with mild Noah/TR.    Last cardiac catheterization was done 09/17/14 and showed an EF of 20-25% at that time. No CAD present.   Has not been admitted or been in the ED in the last 6 months.   He presents today for Rodriguez follow-up visit with Rodriguez chief complaint of minimal shortness of breath upon moderate exertion. He describes this as chronic in nature having been present for several years. He has associated shoulder pain along with this. He denies any difficulty sleeping (although works night shift), dizziness, abdominal distention, palpitations, pedal edema, chest pain, cough, fatigue or weight gain.   Still has not gotten established with Rodriguez PCP. Knows that he needs to get some exercise in but admits that it's difficult because he's so tired when he gets home from working night shift that he doesn't want to do anything but sleep. Took his medications ~ 4 hours ago.  Past Medical History:  Diagnosis Date  . Acute kidney injury (HCC)   . Asthma   . Chronic combined systolic and diastolic CHF (congestive heart failure) (HCC)    EF < 25% on Cath (09/17/2014)  . Gout   . Gout of big toe 09/16/2014  . H/O medication noncompliance   . Hypertension   . Hypertensive heart disease   . Hypokalemia   . MI (myocardial infarction) [facility])    Patient denies having had Rodriguez MI  . NICM (nonischemic cardiomyopathy) (HCC)    Rodriguez. cardiac cath 09/2014 with normal coronary arteries, EF 20-25% with severe global HK; b. echo 08/2014: EF 30-35%, false tendon in LV aepx of no clinical sig, diffuse HK, GR1DD, aortic root 40 mm, trivial Noah, trivial pericardial effusion posterior   . Obesity, Class III, BMI  40-49.9 (morbid obesity) (HCC)   . Sleep apnea    Rodriguez. sleep study 05/2010; b. noncompliant with CPAP  . Ventral hernia    Rodriguez. incarcerated omentum 06/10/2015   Past Surgical History:  Procedure Laterality Date  . CARDIAC CATHETERIZATION N/Rodriguez 09/17/2014   Procedure: Right/Left Heart Cath and Coronary Angiography;  Surgeon: Noah Bihari, MD;  Location: [facility] INVASIVE CV LAB;  Service: Cardiovascular;  Laterality: N/Rodriguez;  . VENTRAL HERNIA REPAIR N/Rodriguez 06/11/2015   Procedure: HERNIA REPAIR VENTRAL ADULT;  Surgeon: Noah Haw, MD;  Location: [facility] ORS;  Service: General;  Laterality: N/Rodriguez;   Family History  Problem Relation Age of Onset  . HIV Mother   . Hypertension Mother   . Hypertension Other   . Hypertension Maternal Grandmother   . Hypertension Paternal Grandmother   . Alcohol abuse Neg Hx   . Cancer Neg Hx   . Early death Neg Hx   . Heart disease Neg Hx   . Hyperlipidemia Neg Hx   . Kidney disease Neg Hx   . Stroke Neg Hx   . Heart attack Neg Hx    Social History   Tobacco Use  . Smoking status: Never Smoker  . Smokeless tobacco: Never Used  Substance Use Topics  . Alcohol use: No   No Known Allergies  Prior to Admission medications   Medication Sig Start  Date End Date Taking? Authorizing Provider  amLODipine (NORVASC) 10 MG tablet Take 1 tablet by mouth once daily 01/28/19  Yes Noah Rodriguez, Noah Fermo Rodriguez, Noah Rodriguez  carvedilol (COREG) 25 MG tablet TAKE 1 TABLET BY MOUTH TWICE DAILY WITH MEALS 12/08/18  Yes Noah Rodriguez Rodriguez, Noah Rodriguez  furosemide (LASIX) 40 MG tablet Take 1 tablet by mouth once daily 10/06/18  Yes Noah Rodriguez Rodriguez, Noah Rodriguez  sacubitril-valsartan (ENTRESTO) 97-103 MG Take 1 tablet by mouth 2 (two) times daily. 06/22/17  Yes Noah Freeze, Noah Rodriguez     Review of Systems  Constitutional: Negative for appetite change and fatigue.  HENT: Negative for congestion, postnasal drip and sore throat.   Eyes: Negative for pain and visual disturbance.  Respiratory: Positive for shortness of breath  (minimal). Negative for cough, chest tightness and wheezing.   Cardiovascular: Negative for chest pain, palpitations and leg swelling.  Gastrointestinal: Negative for abdominal distention, abdominal pain and constipation.  Endocrine: Negative.   Genitourinary: Negative.   Musculoskeletal: Positive for arthralgias (shoulders ache). Negative for back pain and neck pain.  Skin: Negative.   Allergic/Immunologic: Negative.   Neurological: Negative for dizziness, weakness, light-headedness, numbness and headaches.  Hematological: Negative for adenopathy. Does not bruise/bleed easily.  Psychiatric/Behavioral: Negative for dysphoric mood and sleep disturbance (works night shift). The patient is not nervous/anxious.    Vitals:   08/15/19 0814  BP: (!) 157/101  Pulse: 75  Resp: 20  SpO2: 100%  Weight: (!) 352 lb 8 oz (159.9 kg)  Height: 5\' 7"  (1.702 m)   Wt Readings from Last 3 Encounters:  08/15/19 (!) 352 lb 8 oz (159.9 kg)  05/15/19 (!) 350 lb (158.8 kg)  02/15/19 (!) 354 lb 12.8 oz (160.9 kg)   Lab Results  Component Value Date   CREATININE 1.30 (H) 02/15/2019   CREATININE 1.39 (H) 08/05/2017   CREATININE 1.53 (H) 06/22/2017    Physical Exam Vitals and nursing note reviewed.  Constitutional:      Appearance: He is well-developed.  HENT:     Head: Normocephalic and atraumatic.  Neck:     Vascular: No JVD.  Cardiovascular:     Rate and Rhythm: Normal rate and regular rhythm.  Pulmonary:     Effort: Pulmonary effort is normal.     Breath sounds: No wheezing or rales.  Abdominal:     General: There is no distension.     Palpations: Abdomen is soft.     Tenderness: There is no abdominal tenderness.  Musculoskeletal:        General: No tenderness.     Cervical back: Normal range of motion and neck supple.  Skin:    General: Skin is warm and dry.  Neurological:     Mental Status: He is alert and oriented to person, place, and time.  Psychiatric:        Behavior: Behavior  normal.        Thought Content: Thought content normal.     Assessment & Plan:  1: Chronic heart failure with preserved ejection fraction- - NYHA class II - euvolemic - weighing daily; reminded to call for an overnight weight gain of >2 pounds or Rodriguez weekly weight gain of >5 pounds - weight up 2 pounds from last visit here 3 months ago - continues to drink soda but is drinking more water - has become more active at work but knows that he needs to get more consistent exercise in - discussed that we really need to update his echo (last one  done 2018) but he doesn't want to schedule it right now as he still remains uninsured - not adding salt to his food and is reading food labels, however, 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  - reports receiving both COVID vaccines  2: HTN- - BP elevated; explained that if it was still elevated at next visit, another medication would need to be added  - reminded him that the higher his BP, the more risk he is at for stroke, renal damage, etc and patient says that he understands - again emphasized that he needed to have Rodriguez PCP appointment scheduled by the next time he returns; offered to call Phineas Real and schedule an appointment but he assures me that he will work on it - suspect that this is multifactorial due to working at night, uncontrolled OSA, stress related to work  - [employer] 02/15/19 reviewed and showed Na 139, K 4.0, GFR > 60, and Scr 1.3   Medication bottles were reviewed.  Return in 3 months or sooner for any questions/problems before then

## 2019-08-15 ENCOUNTER — Ambulatory Visit: Payer: Self-pay | Attending: Family | Admitting: Family

## 2019-08-15 ENCOUNTER — Other Ambulatory Visit: Payer: Self-pay

## 2019-08-15 ENCOUNTER — Encounter: Payer: Self-pay | Admitting: Family

## 2019-08-15 VITALS — BP 157/101 | HR 75 | Resp 20 | Ht 67.0 in | Wt 352.5 lb

## 2019-08-15 DIAGNOSIS — I110 Hypertensive heart disease with heart failure: Secondary | ICD-10-CM | POA: Insufficient documentation

## 2019-08-15 DIAGNOSIS — I10 Essential (primary) hypertension: Secondary | ICD-10-CM

## 2019-08-15 DIAGNOSIS — I5042 Chronic combined systolic (congestive) and diastolic (congestive) heart failure: Secondary | ICD-10-CM | POA: Insufficient documentation

## 2019-08-15 DIAGNOSIS — J45909 Unspecified asthma, uncomplicated: Secondary | ICD-10-CM | POA: Insufficient documentation

## 2019-08-15 DIAGNOSIS — Z79899 Other long term (current) drug therapy: Secondary | ICD-10-CM | POA: Insufficient documentation

## 2019-08-15 DIAGNOSIS — Z8249 Family history of ischemic heart disease and other diseases of the circulatory system: Secondary | ICD-10-CM | POA: Insufficient documentation

## 2019-08-15 DIAGNOSIS — G4733 Obstructive sleep apnea (adult) (pediatric): Secondary | ICD-10-CM | POA: Insufficient documentation

## 2019-08-15 DIAGNOSIS — I428 Other cardiomyopathies: Secondary | ICD-10-CM | POA: Insufficient documentation

## 2019-08-15 DIAGNOSIS — I5032 Chronic diastolic (congestive) heart failure: Secondary | ICD-10-CM

## 2019-08-15 DIAGNOSIS — M109 Gout, unspecified: Secondary | ICD-10-CM | POA: Insufficient documentation

## 2019-08-15 DIAGNOSIS — I252 Old myocardial infarction: Secondary | ICD-10-CM | POA: Insufficient documentation

## 2019-08-15 NOTE — Patient Instructions (Addendum)
Continue weighing daily and call for an overnight weight gain of > 2 pounds or a weekly weight gain of >5 pounds.   Make a new patient appointment by the time you return

## 2019-11-13 NOTE — Progress Notes (Signed)
Patient ID: Noah Rodriguez, male    DOB: 1975/05/19, 44 y.o.   MRN: [id number]  HPI  Noah Rodriguez is a 44 y/o male with a history of obstructive sleep apnea, obesity, hypokalemia, gout, asthma and chronic heart failure.   Echo done 08/25/16 reviewed and shows an EF of 60-65%. Echo done 02/14/16 and showed an EF of 35-40% along with mild Noah/TR.    Last cardiac catheterization was done 09/17/14 and showed an EF of 20-25% at that time. No CAD present.   Was in the ED 11/05/19 due to acute gout of right knee. Evaluated and released. DVT ruled out and no knee fracture noted.   He presents today for a follow-up visit with a chief complaint of minimal shortness of breath upon moderate exertion. He describes this as chronic in nature having been present for several years. He has associated right knee pain along with this. He denies any difficulty sleeping, dizziness, headaches, abdominal distention, palpitations, pedal edema, chest pain, cough, fatigue or weight gain.   Still has not gotten established with a PCP although says that he has an appointment in [place] on 11/21/19 to fill out paperwork for sliding scale payment. Says that his recent ED visit with his gout make it clear to him that he needed to get established with someone. Says that he spent 13-14 hours in the ED waiting room.   Past Medical History:  Diagnosis Date  . Acute kidney injury (HCC)   . Asthma   . Chronic combined systolic and diastolic CHF (congestive heart failure) (HCC)    EF < 25% on Cath (09/17/2014)  . Gout   . Gout of big toe 09/16/2014  . H/O medication noncompliance   . Hypertension   . Hypertensive heart disease   . Hypokalemia   . MI (myocardial infarction) [facility])    Patient denies having had a MI  . NICM (nonischemic cardiomyopathy) (HCC)    a. cardiac cath 09/2014 with normal coronary arteries, EF 20-25% with severe global HK; b. echo 08/2014: EF 30-35%, false tendon in LV aepx of no clinical sig, diffuse HK,  GR1DD, aortic root 40 mm, trivial Noah, trivial pericardial effusion posterior   . Obesity, Class III, BMI 40-49.9 (morbid obesity) (HCC)   . Sleep apnea    a. sleep study 05/2010; b. noncompliant with CPAP  . Ventral hernia    a. incarcerated omentum 06/10/2015   Past Surgical History:  Procedure Laterality Date  . CARDIAC CATHETERIZATION N/A 09/17/2014   Procedure: Right/Left Heart Cath and Coronary Angiography;  Surgeon: Lennette Bihari, MD;  Location: [facility] INVASIVE CV LAB;  Service: Cardiovascular;  Laterality: N/A;  . VENTRAL HERNIA REPAIR N/A 06/11/2015   Procedure: HERNIA REPAIR VENTRAL ADULT;  Surgeon: Lattie Haw, MD;  Location: [facility] ORS;  Service: General;  Laterality: N/A;   Family History  Problem Relation Age of Onset  . HIV Mother   . Hypertension Mother   . Hypertension Other   . Hypertension Maternal Grandmother   . Hypertension Paternal Grandmother   . Alcohol abuse Neg Hx   . Cancer Neg Hx   . Early death Neg Hx   . Heart disease Neg Hx   . Hyperlipidemia Neg Hx   . Kidney disease Neg Hx   . Stroke Neg Hx   . Heart attack Neg Hx    Social History   Tobacco Use  . Smoking status: Never Smoker  . Smokeless tobacco: Never Used  Substance Use Topics  .  Alcohol use: No   No Known Allergies  Prior to Admission medications   Medication Sig Start Date End Date Taking? Authorizing Provider  amLODipine (NORVASC) 10 MG tablet Take 1 tablet by mouth once daily 01/28/19  Yes Jaline Pincock, Inetta Fermo A, FNP  carvedilol (COREG) 25 MG tablet TAKE 1 TABLET BY MOUTH TWICE DAILY WITH MEALS 12/08/18  Yes Clarisa Kindred A, FNP  furosemide (LASIX) 40 MG tablet Take 1 tablet by mouth once daily 10/06/18  Yes Khamora Karan A, FNP  ibuprofen (ADVIL) 200 MG tablet Take 200 mg by mouth every 6 (six) hours as needed (gout pain).   Yes [provider]  sacubitril-valsartan (ENTRESTO) 97-103 MG Take 1 tablet by mouth 2 (two) times daily. 06/22/17  Yes Delma Freeze, FNP    Review of  Systems  Constitutional: Negative for appetite change and fatigue.  HENT: Negative for congestion, postnasal drip and sore throat.   Eyes: Negative for pain and visual disturbance.  Respiratory: Positive for shortness of breath (minimal). Negative for cough, chest tightness and wheezing.   Cardiovascular: Negative for chest pain, palpitations and leg swelling.  Gastrointestinal: Negative for abdominal distention, abdominal pain and constipation.  Endocrine: Negative.   Genitourinary: Negative.   Musculoskeletal: Positive for arthralgias (right knee). Negative for back pain and neck pain.  Skin: Negative.   Allergic/Immunologic: Negative.   Neurological: Negative for dizziness, weakness, light-headedness, numbness and headaches.  Hematological: Negative for adenopathy. Does not bruise/bleed easily.  Psychiatric/Behavioral: Negative for dysphoric mood and sleep disturbance (works night shift). The patient is not nervous/anxious.    Vitals:   11/15/19 0824  BP: (!) 175/89  Pulse: 83  Resp: 20  SpO2: 99%  Weight: (!) 344 lb (156 kg)  Height: 5\' 7"  (1.702 m)   Wt Readings from Last 3 Encounters:  11/15/19 (!) 344 lb (156 kg)  08/15/19 (!) 352 lb 8 oz (159.9 kg)  05/15/19 (!) 350 lb (158.8 kg)   Lab Results  Component Value Date   CREATININE 1.30 (H) 02/15/2019   CREATININE 1.39 (H) 08/05/2017   CREATININE 1.53 (H) 06/22/2017    Physical Exam Vitals and nursing note reviewed.  Constitutional:      Appearance: He is well-developed.  HENT:     Head: Normocephalic and atraumatic.  Neck:     Vascular: No JVD.  Cardiovascular:     Rate and Rhythm: Normal rate and regular rhythm.  Pulmonary:     Effort: Pulmonary effort is normal.     Breath sounds: No wheezing or rales.  Abdominal:     General: There is no distension.     Palpations: Abdomen is soft.     Tenderness: There is no abdominal tenderness.  Musculoskeletal:        General: No tenderness.     Cervical back:  Normal range of motion and neck supple.  Skin:    General: Skin is warm and dry.  Neurological:     Mental Status: He is alert and oriented to person, place, and time.  Psychiatric:        Behavior: Behavior normal.        Thought Content: Thought content normal.     Assessment & Plan:  1: Chronic heart failure with preserved ejection fraction- - NYHA class II - euvolemic - weighing daily; reminded to call for an overnight weight gain of >2 pounds or a weekly weight gain of >5 pounds - weight down 8 pounds from last visit here 3 months ago - drinking  more water and no more soda (was drinking 3 mountain dew's & sweet tea daily) - has become more active at work but knows that he needs to get more consistent exercise in - discussed that we really need to update his echo (last one done 2018) but he doesn't want to schedule it right now as he still remains uninsured - not adding salt to his food and is reading food labels, however, 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  - reports receiving both COVID vaccines and booster vaccine - pro-BNP on 11/04/19 was 82   2: HTN- - BP elevated (175/89)  - says that he has an appointment on 11/21/19 at a PCP office in [place] that does sliding scale payments based on income - he would like to wait on adding medications until he gets established there and he wants to continue working on his diet - suspect that this is multifactorial due to working at night, uncontrolled OSA, stress related to work  - [employer] 11/04/19 reviewed and showed Na 138, K 4.5, GFR 75 and Scr 1.33  3: Gout of right knee- - treated with indocin - CRP level on 11/05/19 elevated at 59.83   Medication bottles were reviewed.  Return in 3 months or sooner for any questions/problems before then.

## 2019-11-15 ENCOUNTER — Other Ambulatory Visit: Payer: Self-pay

## 2019-11-15 ENCOUNTER — Encounter: Payer: Self-pay | Admitting: Family

## 2019-11-15 ENCOUNTER — Ambulatory Visit: Payer: Self-pay | Attending: Family | Admitting: Family

## 2019-11-15 VITALS — BP 175/89 | HR 83 | Resp 20 | Ht 67.0 in | Wt 344.0 lb

## 2019-11-15 DIAGNOSIS — I110 Hypertensive heart disease with heart failure: Secondary | ICD-10-CM | POA: Insufficient documentation

## 2019-11-15 DIAGNOSIS — I10 Essential (primary) hypertension: Secondary | ICD-10-CM

## 2019-11-15 DIAGNOSIS — J45909 Unspecified asthma, uncomplicated: Secondary | ICD-10-CM | POA: Insufficient documentation

## 2019-11-15 DIAGNOSIS — Z8249 Family history of ischemic heart disease and other diseases of the circulatory system: Secondary | ICD-10-CM | POA: Insufficient documentation

## 2019-11-15 DIAGNOSIS — M10061 Idiopathic gout, right knee: Secondary | ICD-10-CM | POA: Insufficient documentation

## 2019-11-15 DIAGNOSIS — G4733 Obstructive sleep apnea (adult) (pediatric): Secondary | ICD-10-CM | POA: Insufficient documentation

## 2019-11-15 DIAGNOSIS — Z79899 Other long term (current) drug therapy: Secondary | ICD-10-CM | POA: Insufficient documentation

## 2019-11-15 DIAGNOSIS — I5032 Chronic diastolic (congestive) heart failure: Secondary | ICD-10-CM | POA: Insufficient documentation

## 2019-11-15 DIAGNOSIS — Z6841 Body Mass Index (BMI) 40.0 and over, adult: Secondary | ICD-10-CM | POA: Insufficient documentation

## 2019-11-15 DIAGNOSIS — M1A0610 Idiopathic chronic gout, right knee, without tophus (tophi): Secondary | ICD-10-CM

## 2019-11-15 MED ORDER — FUROSEMIDE 40 MG PO TABS: 40.0000 mg | ORAL_TABLET | Freq: Every day | ORAL | 3 refills | Status: DC

## 2019-11-15 NOTE — Patient Instructions (Signed)
Continue weighing daily and call for an overnight weight gain of > 2 pounds or a weekly weight gain of >5 pounds. 

## 2019-11-19 ENCOUNTER — Telehealth: Payer: Self-pay | Admitting: Family

## 2019-11-19 NOTE — Telephone Encounter (Signed)
Called to notify patient his [employer] patient assistance application renewal for Sherryll Burger was approved tilll 12/2020.   Noah Rodriguez, NT

## 2019-12-10 ENCOUNTER — Other Ambulatory Visit: Payer: Self-pay | Admitting: Family

## 2020-02-17 NOTE — Progress Notes (Signed)
Patient ID: Noah Rodriguez, male    DOB: 1975-03-08, 45 y.o.   MRN: [id number]  HPI  Noah Rodriguez is a 45 y/o male with a history of obstructive sleep apnea, obesity, hypokalemia, gout, asthma and chronic heart failure.   Echo done 08/25/16 reviewed and shows an EF of 60-65%. Echo done 02/14/16 and showed an EF of 35-40% along with mild Noah/TR.    Last cardiac catheterization was done 09/17/14 and showed an EF of 20-25% at that time. No CAD present.   Was in the ED 11/05/19 due to acute gout of right knee. Evaluated and released. DVT ruled out and no knee fracture noted.   He presents today for a follow-up visit with a chief complaint of minimal shortness of breath upon moderate exertion. He describes this as chronic in nature having been present for several years. Does get short of breath upon walking to the office but recovers quickly. Has associated gout pain in his right knee along with this. He denies any difficulty sleeping (does work night shift), dizziness, abdominal distention, palpitations, pedal edema, chest pain, cough or fatigue.   Admits to not weighing himself. Says that he rides with his girlfriend every weekend to [place] [facility] as she's a truck driver. He admits that they tend to eat fast food such as pizza and fried chicken along with eating salty snacks like potato chips and drinks soda along the way. Says that he's now doing this every weekend.   He says that he's now taking his medications like he is supposed to. Is on the waitlist for the PCP office in [place] and is hoping they will be calling him soon.   Past Medical History:  Diagnosis Date  . Acute kidney injury (HCC)   . Asthma   . Chronic combined systolic and diastolic CHF (congestive heart failure) (HCC)    EF < 25% on Cath (09/17/2014)  . Gout   . Gout of big toe 09/16/2014  . H/O medication noncompliance   . Hypertension   . Hypertensive heart disease   . Hypokalemia   . MI (myocardial infarction) [facility])    Patient denies  having had a MI  . NICM (nonischemic cardiomyopathy) (HCC)    a. cardiac cath 09/2014 with normal coronary arteries, EF 20-25% with severe global HK; b. echo 08/2014: EF 30-35%, false tendon in LV aepx of no clinical sig, diffuse HK, GR1DD, aortic root 40 mm, trivial Noah, trivial pericardial effusion posterior   . Obesity, Class III, BMI 40-49.9 (morbid obesity) (HCC)   . Sleep apnea    a. sleep study 05/2010; b. noncompliant with CPAP  . Ventral hernia    a. incarcerated omentum 06/10/2015   Past Surgical History:  Procedure Laterality Date  . CARDIAC CATHETERIZATION N/A 09/17/2014   Procedure: Right/Left Heart Cath and Coronary Angiography;  Surgeon: Lennette Bihari, MD;  Location: [facility] INVASIVE CV LAB;  Service: Cardiovascular;  Laterality: N/A;  . VENTRAL HERNIA REPAIR N/A 06/11/2015   Procedure: HERNIA REPAIR VENTRAL ADULT;  Surgeon: Lattie Haw, MD;  Location: [facility] ORS;  Service: General;  Laterality: N/A;   Family History  Problem Relation Age of Onset  . HIV Mother   . Hypertension Mother   . Hypertension Other   . Hypertension Maternal Grandmother   . Hypertension Paternal Grandmother   . Alcohol abuse Neg Hx   . Cancer Neg Hx   . Early death Neg Hx   . Heart disease Neg Hx   . Hyperlipidemia Neg  Hx   . Kidney disease Neg Hx   . Stroke Neg Hx   . Heart attack Neg Hx    Social History   Tobacco Use  . Smoking status: Never Smoker  . Smokeless tobacco: Never Used  Substance Use Topics  . Alcohol use: No   No Known Allergies  Prior to Admission medications   Medication Sig Start Date End Date Taking? Authorizing Provider  amLODipine (NORVASC) 10 MG tablet Take 1 tablet by mouth once daily 01/28/19  Yes Shawnelle Spoerl, Jarold Song, FNP  carvedilol (COREG) 25 MG tablet Take 1 tablet (25 mg total) by mouth 2 (two) times daily with a meal. 12/10/19  Yes Clarisa Kindred A, FNP  furosemide (LASIX) 40 MG tablet Take 1 tablet (40 mg total) by mouth daily. 12/10/19  Yes Jonel Weldon, Inetta Fermo A, FNP   ibuprofen (ADVIL) 200 MG tablet Take 200 mg by mouth every 6 (six) hours as needed (gout pain).   Yes [provider]  sacubitril-valsartan (ENTRESTO) 97-103 MG Take 1 tablet by mouth 2 (two) times daily. 06/22/17  Yes Delma Freeze, FNP    Review of Systems  Constitutional: Negative for appetite change and fatigue.  HENT: Negative for congestion, postnasal drip and sore throat.   Eyes: Negative for pain and visual disturbance.  Respiratory: Positive for shortness of breath (minimal). Negative for cough, chest tightness and wheezing.   Cardiovascular: Negative for chest pain, palpitations and leg swelling.  Gastrointestinal: Negative for abdominal distention, abdominal pain and constipation.  Endocrine: Negative.   Genitourinary: Negative.   Musculoskeletal: Positive for arthralgias (right knee). Negative for back pain and neck pain.  Skin: Negative.   Allergic/Immunologic: Negative.   Neurological: Negative for dizziness, weakness, light-headedness, numbness and headaches.  Hematological: Negative for adenopathy. Does not bruise/bleed easily.  Psychiatric/Behavioral: Negative for dysphoric mood and sleep disturbance (works night shift). The patient is not nervous/anxious.    Vitals:   02/19/20 0820  BP: 122/76  Pulse: 72  Resp: 18  SpO2: 98%  Weight: (!) 348 lb (157.9 kg)  Height: 5\' 7"  (1.702 m)   Wt Readings from Last 3 Encounters:  02/19/20 (!) 348 lb (157.9 kg)  11/15/19 (!) 344 lb (156 kg)  08/15/19 (!) 352 lb 8 oz (159.9 kg)   Lab Results  Component Value Date   CREATININE 1.30 (H) 02/15/2019   CREATININE 1.39 (H) 08/05/2017   CREATININE 1.53 (H) 06/22/2017    Physical Exam Vitals and nursing note reviewed.  Constitutional:      Appearance: He is well-developed.  HENT:     Head: Normocephalic and atraumatic.  Neck:     Vascular: No JVD.  Cardiovascular:     Rate and Rhythm: Normal rate and regular rhythm.  Pulmonary:     Effort: Pulmonary effort  is normal.     Breath sounds: No wheezing or rales.  Abdominal:     General: There is no distension.     Palpations: Abdomen is soft.     Tenderness: There is no abdominal tenderness.  Musculoskeletal:        General: No tenderness.     Cervical back: Normal range of motion and neck supple.  Skin:    General: Skin is warm and dry.  Neurological:     Mental Status: He is alert and oriented to person, place, and time.  Psychiatric:        Behavior: Behavior normal.        Thought Content: Thought content normal.  Assessment & Plan:  1: Chronic heart failure with preserved ejection fraction- - NYHA class II - euvolemic - not weighing daily but does have scales; says that he's just too tired when getting off work to weigh; encouraged him to resume weighing daily so that he can call for an overnight weight gain of >2 pounds or a weekly weight gain of >5 pounds - weight up 4 pounds from last visit here 3 months ago - drinking more water but does drink soda on the weekend when he's riding with his girlfriend to [place] as she's a truck driver - eating more salt on the weekends with the long distance drives such as pizza, fried chicken and salty snacks; encouraged him to plan ahead for snacks etc if he's going to be doing this weekly; he says that he told his girlfriend that they need to pack a cooler and take with them - continues to not add any salt to his food - has become more active at work but knows that he needs to get more consistent exercise in - discussed that we really need to update his echo (last one done 2018) but he doesn't want to schedule it right now as he still remains uninsured - reports receiving both COVID vaccines and booster vaccine - pro-BNP on 11/04/19 was 82   2: HTN- - BP looks good today - still waiting on a call from PCP office in [place] that does sliding scale payments based on income for people that are uninsured - suspect that this is multifactorial due to  working at night, uncontrolled OSA, stress related to work  - [employer] 11/04/19 reviewed and showed Na 138, K 4.5, GFR 75 and Scr 1.33  3: Gout of right knee- - treated with indocin but pain continues - reviewed foods to avoid (pork, seafood, beans, soda) and foods that may help (bananas, cranberry juice) - CRP level on 11/05/19 elevated at 59.83   Medication bottles were reviewed.  Return in 3 months or sooner for any questions/problems before then.

## 2020-02-19 ENCOUNTER — Encounter: Payer: Self-pay | Admitting: Family

## 2020-02-19 ENCOUNTER — Ambulatory Visit: Payer: Self-pay | Attending: Family | Admitting: Family

## 2020-02-19 ENCOUNTER — Other Ambulatory Visit: Payer: Self-pay

## 2020-02-19 VITALS — BP 122/76 | HR 72 | Resp 18 | Ht 67.0 in | Wt 348.0 lb

## 2020-02-19 DIAGNOSIS — I5032 Chronic diastolic (congestive) heart failure: Secondary | ICD-10-CM | POA: Insufficient documentation

## 2020-02-19 DIAGNOSIS — Z566 Other physical and mental strain related to work: Secondary | ICD-10-CM | POA: Insufficient documentation

## 2020-02-19 DIAGNOSIS — J45909 Unspecified asthma, uncomplicated: Secondary | ICD-10-CM | POA: Insufficient documentation

## 2020-02-19 DIAGNOSIS — Z7182 Exercise counseling: Secondary | ICD-10-CM | POA: Insufficient documentation

## 2020-02-19 DIAGNOSIS — M109 Gout, unspecified: Secondary | ICD-10-CM | POA: Insufficient documentation

## 2020-02-19 DIAGNOSIS — I110 Hypertensive heart disease with heart failure: Secondary | ICD-10-CM | POA: Insufficient documentation

## 2020-02-19 DIAGNOSIS — Z79899 Other long term (current) drug therapy: Secondary | ICD-10-CM | POA: Insufficient documentation

## 2020-02-19 DIAGNOSIS — E876 Hypokalemia: Secondary | ICD-10-CM | POA: Insufficient documentation

## 2020-02-19 DIAGNOSIS — Z597 Insufficient social insurance and welfare support: Secondary | ICD-10-CM | POA: Insufficient documentation

## 2020-02-19 DIAGNOSIS — G4733 Obstructive sleep apnea (adult) (pediatric): Secondary | ICD-10-CM | POA: Insufficient documentation

## 2020-02-19 DIAGNOSIS — I252 Old myocardial infarction: Secondary | ICD-10-CM | POA: Insufficient documentation

## 2020-02-19 DIAGNOSIS — Z6841 Body Mass Index (BMI) 40.0 and over, adult: Secondary | ICD-10-CM | POA: Insufficient documentation

## 2020-02-19 DIAGNOSIS — I10 Essential (primary) hypertension: Secondary | ICD-10-CM

## 2020-02-19 DIAGNOSIS — M1A0610 Idiopathic chronic gout, right knee, without tophus (tophi): Secondary | ICD-10-CM

## 2020-02-19 NOTE — Patient Instructions (Addendum)
Resume weighing daily and call for an overnight weight gain of > 2 pounds or a weekly weight gain of >5 pounds. 

## 2020-02-21 ENCOUNTER — Other Ambulatory Visit: Payer: Self-pay | Admitting: Family

## 2020-05-18 NOTE — Progress Notes (Deleted)
Patient ID: Noah Rodriguez, male    DOB: 09/16/1975, 45 y.o.   MRN: [id number]  HPI  Mr Skoczylas is a 45 y/o male with a history of obstructive sleep apnea, obesity, hypokalemia, gout, asthma and chronic heart failure.   Echo done 08/25/16 reviewed and shows an EF of 60-65%. Echo done 02/14/16 and showed an EF of 35-40% along with mild MR/TR.    Last cardiac catheterization was done 09/17/14 and showed an EF of 20-25% at that time. No CAD present.   Has not been admitted or been in the ED in the last 6 months.    He presents today for a follow-up visit with a chief complaint of    Admits to not weighing himself. Says that he rides with his girlfriend every weekend to [facility] as she's a truck driver. He admits that they tend to eat fast food such as pizza and fried chicken along with eating salty snacks like potato chips and drinks soda along the way. Says that he's now doing this every weekend.     Past Medical History:  Diagnosis Date  . Acute kidney injury (HCC)   . Asthma   . Chronic combined systolic and diastolic CHF (congestive heart failure) (HCC)    EF < 25% on Cath (09/17/2014)  . Gout   . Gout of big toe 09/16/2014  . H/O medication noncompliance   . Hypertension   . Hypertensive heart disease   . Hypokalemia   . MI (myocardial infarction) [facility])    Patient denies having had a MI  . NICM (nonischemic cardiomyopathy) (HCC)    a. cardiac cath 09/2014 with normal coronary arteries, EF 20-25% with severe global HK; b. echo 08/2014: EF 30-35%, false tendon in LV aepx of no clinical sig, diffuse HK, GR1DD, aortic root 40 mm, trivial MR, trivial pericardial effusion posterior   . Obesity, Class III, BMI 40-49.9 (morbid obesity) (HCC)   . Sleep apnea    a. sleep study 05/2010; b. noncompliant with CPAP  . Ventral hernia    a. incarcerated omentum 06/10/2015   Past Surgical History:  Procedure Laterality Date  . CARDIAC CATHETERIZATION N/A 09/17/2014   Procedure: Right/Left Heart Cath  and Coronary Angiography;  Surgeon: Lennette Bihari, MD;  Location: [facility] INVASIVE CV LAB;  Service: Cardiovascular;  Laterality: N/A;  . VENTRAL HERNIA REPAIR N/A 06/11/2015   Procedure: HERNIA REPAIR VENTRAL ADULT;  Surgeon: Lattie Haw, MD;  Location: [facility] ORS;  Service: General;  Laterality: N/A;   Family History  Problem Relation Age of Onset  . HIV Mother   . Hypertension Mother   . Hypertension Other   . Hypertension Maternal Grandmother   . Hypertension Paternal Grandmother   . Alcohol abuse Neg Hx   . Cancer Neg Hx   . Early death Neg Hx   . Heart disease Neg Hx   . Hyperlipidemia Neg Hx   . Kidney disease Neg Hx   . Stroke Neg Hx   . Heart attack Neg Hx    Social History   Tobacco Use  . Smoking status: Never Smoker  . Smokeless tobacco: Never Used  Substance Use Topics  . Alcohol use: No   No Known Allergies    Review of Systems  Constitutional: Negative for appetite change and fatigue.  HENT: Negative for congestion, postnasal drip and sore throat.   Eyes: Negative for pain and visual disturbance.  Respiratory: Positive for shortness of breath (minimal). Negative for cough, chest tightness and  wheezing.   Cardiovascular: Negative for chest pain, palpitations and leg swelling.  Gastrointestinal: Negative for abdominal distention, abdominal pain and constipation.  Endocrine: Negative.   Genitourinary: Negative.   Musculoskeletal: Positive for arthralgias (right knee). Negative for back pain and neck pain.  Skin: Negative.   Allergic/Immunologic: Negative.   Neurological: Negative for dizziness, weakness, light-headedness, numbness and headaches.  Hematological: Negative for adenopathy. Does not bruise/bleed easily.  Psychiatric/Behavioral: Negative for dysphoric mood and sleep disturbance (works night shift). The patient is not nervous/anxious.      Physical Exam Vitals and nursing note reviewed.  Constitutional:      Appearance: He is well-developed.   HENT:     Head: Normocephalic and atraumatic.  Neck:     Vascular: No JVD.  Cardiovascular:     Rate and Rhythm: Normal rate and regular rhythm.  Pulmonary:     Effort: Pulmonary effort is normal.     Breath sounds: No wheezing or rales.  Abdominal:     General: There is no distension.     Palpations: Abdomen is soft.     Tenderness: There is no abdominal tenderness.  Musculoskeletal:        General: No tenderness.     Cervical back: Normal range of motion and neck supple.  Skin:    General: Skin is warm and dry.  Neurological:     Mental Status: He is alert and oriented to person, place, and time.  Psychiatric:        Behavior: Behavior normal.        Thought Content: Thought content normal.     Assessment & Plan:  1: Chronic heart failure with preserved ejection fraction- - NYHA class II - euvolemic - not weighing daily but does have scales; says that he's just too tired when getting off work to weigh; encouraged him to resume weighing daily so that he can call for an overnight weight gain of >2 pounds or a weekly weight gain of >5 pounds - weight 348 pounds from last visit here 3 months ago - drinking more water but does drink soda on the weekend when he's riding with his girlfriend to [place] as she's a truck driver - eating more salt on the weekends with the long distance drives such as pizza, fried chicken and salty snacks; encouraged him to plan ahead for snacks etc if he's going to be doing this weekly; he says that he told his girlfriend that they need to pack a cooler and take with them - continues to not add any salt to his food - has become more active at work but knows that he needs to get more consistent exercise in - discussed that we really need to update his echo (last one done 2018) but he doesn't want to schedule it right now as he still remains uninsured - pro-BNP on 11/04/19 was 82   2: HTN- - BP  - still waiting on a call from PCP office in [place] that does  sliding scale payments based on income for people that are uninsured - suspect that this is multifactorial due to working at night, uncontrolled OSA, stress related to work  - [employer] 11/04/19 reviewed and showed Na 138, K 4.5, GFR 75 and Scr 1.33  3: Gout of right knee- - treated with indocin but pain continues - reviewed foods to avoid (pork, seafood, beans, soda) and foods that may help (bananas, cranberry juice) - CRP level on 11/05/19 elevated at 59.83   Medication bottles  were reviewed.

## 2020-05-19 ENCOUNTER — Ambulatory Visit: Payer: Self-pay | Admitting: Family

## 2020-05-28 ENCOUNTER — Encounter: Payer: Self-pay | Admitting: Family

## 2020-05-28 ENCOUNTER — Other Ambulatory Visit: Payer: Self-pay

## 2020-05-28 ENCOUNTER — Ambulatory Visit: Payer: Self-pay | Attending: Family | Admitting: Family

## 2020-05-28 VITALS — BP 122/82 | HR 83 | Resp 18 | Ht 67.0 in | Wt 347.4 lb

## 2020-05-28 DIAGNOSIS — I10 Essential (primary) hypertension: Secondary | ICD-10-CM

## 2020-05-28 DIAGNOSIS — G4730 Sleep apnea, unspecified: Secondary | ICD-10-CM | POA: Insufficient documentation

## 2020-05-28 DIAGNOSIS — Z9119 Patient's noncompliance with other medical treatment and regimen: Secondary | ICD-10-CM | POA: Insufficient documentation

## 2020-05-28 DIAGNOSIS — I110 Hypertensive heart disease with heart failure: Secondary | ICD-10-CM | POA: Insufficient documentation

## 2020-05-28 DIAGNOSIS — Z6841 Body Mass Index (BMI) 40.0 and over, adult: Secondary | ICD-10-CM | POA: Insufficient documentation

## 2020-05-28 DIAGNOSIS — M1A0610 Idiopathic chronic gout, right knee, without tophus (tophi): Secondary | ICD-10-CM

## 2020-05-28 DIAGNOSIS — E669 Obesity, unspecified: Secondary | ICD-10-CM | POA: Insufficient documentation

## 2020-05-28 DIAGNOSIS — J45909 Unspecified asthma, uncomplicated: Secondary | ICD-10-CM | POA: Insufficient documentation

## 2020-05-28 DIAGNOSIS — I509 Heart failure, unspecified: Secondary | ICD-10-CM | POA: Insufficient documentation

## 2020-05-28 DIAGNOSIS — Z79899 Other long term (current) drug therapy: Secondary | ICD-10-CM | POA: Insufficient documentation

## 2020-05-28 DIAGNOSIS — Z8249 Family history of ischemic heart disease and other diseases of the circulatory system: Secondary | ICD-10-CM | POA: Insufficient documentation

## 2020-05-28 DIAGNOSIS — I5042 Chronic combined systolic (congestive) and diastolic (congestive) heart failure: Secondary | ICD-10-CM | POA: Insufficient documentation

## 2020-05-28 DIAGNOSIS — M109 Gout, unspecified: Secondary | ICD-10-CM | POA: Insufficient documentation

## 2020-05-28 DIAGNOSIS — I252 Old myocardial infarction: Secondary | ICD-10-CM | POA: Insufficient documentation

## 2020-05-28 DIAGNOSIS — I5032 Chronic diastolic (congestive) heart failure: Secondary | ICD-10-CM

## 2020-05-28 DIAGNOSIS — G4733 Obstructive sleep apnea (adult) (pediatric): Secondary | ICD-10-CM | POA: Insufficient documentation

## 2020-05-28 NOTE — Patient Instructions (Addendum)
Continue weighing daily and call for an overnight weight gain of > 2 pounds or a weekly weight gain of >5 pounds.   Stop taking ibuprofen and try using over the counter voltaren gel   [facility] Phone: [phone] Pharmacy: [phone] [address] [place] [address]    [facility] [place] [email or web address] [phone]

## 2020-05-28 NOTE — Progress Notes (Signed)
[facility] - PHARMACIST COUNSELING NOTE  ADHERENCE ASSESSMENT   Do you ever forget to take your medication? [] Yes (1) [x] No (0)  Do you ever skip doses due to side effects? [] Yes (1) [x] No (0)  Do you have trouble affording your medicines? [] Yes (1) [x] No (0)  Are you ever unable to pick up your medication due to transportation difficulties? [] Yes (1) [x] No (0)  Do you ever stop taking your medications because you don't believe they are helping? [] Yes (1) [x] No (0)  Total score _0______    Recommendations given to patient about increasing adherence: pt endorses adherence and does not have any barriers in obtaining his medications  Guideline-Directed Medical Therapy/Evidence Based Medicine  ACE/ARB/ARNI: Land[facility] Corporal Blocker: Carvedilol Aldosterone Antagonist: None Diuretic: Furosemide    SUBJECTIVE  HPI: 45yo male with a history of obstructive sleep apnea, obesity, hypokalemia, gout, asthma and chronic heart failure. Pt overall is feeling well and states that his blood pressures has been running high despite adding amlodipine although his repeat BP was WNL (122/82); pt attributes his high BP to working at night and coming straight from work. He also reports some pain in his left knee which is relieved by ibuprofen.   Past Medical History:  Diagnosis Date  . Acute kidney injury ([place])   . Asthma   . Chronic combined systolic and diastolic CHF (congestive heart failure) (HCC)    EF < 25% on Cath (09/17/2014)  . Gout   . Gout of big toe 09/16/2014  . H/O medication noncompliance   . Hypertension   . Hypertensive heart disease   . Hypokalemia   . MI (myocardial infarction) [facility])    Patient denies having had a MI  . NICM (nonischemic cardiomyopathy) ([place])    a. cardiac cath 09/2014 with normal coronary arteries, EF 20-25% with severe global HK; b. echo 08/2014: EF 30-35%, false tendon in LV aepx of no clinical sig, diffuse HK, GR1DD,  aortic root 40 mm, trivial MR, trivial pericardial effusion posterior   . Obesity, Class III, BMI 40-49.9 (morbid obesity) ([place])   . Sleep apnea    a. sleep study 05/2010; b. noncompliant with CPAP  . Ventral hernia    a. incarcerated omentum 06/10/2015        OBJECTIVE   Vital signs: BP 165/97>122/82, weight (pounds) 347.6 ECHO: Date 08/25/2016, EF 60-65% Cath: Date 09/17/2014, EF 20-25%, notes elevated right heart pressures with mild pHTN. Left ventricular dilation with severe global hypo-contractility. Normal coronaries  BMP Latest Ref Rng & Units 02/15/2019 08/05/2017 06/22/2017  Glucose 70 - 99 mg/dL 125(H) 140(H) 99  BUN 6 - 20 mg/dL 19 19 19   Creatinine 0.61 - 1.24 mg/dL 1.30(H) 1.39(H) 1.53(H)  Sodium 135 - 145 mmol/L 139 142 137  Potassium 3.5 - 5.1 mmol/L 4.0 3.5 3.6  Chloride 98 - 111 mmol/L 102 103 101  CO2 22 - 32 mmol/L 28 32 29  Calcium 8.9 - 10.3 mg/dL 8.9 8.5(L) 8.8(L)    ASSESSMENT/PLAN Chronic heart failure with preserved ejection fraction -continue carvedilol 25mg  BID, furosemide 40mg  daily, and entresto 97-103mg  BID -consider addition of aldosterone receptor antagonist if BP remains elevated -pt was unable to take daily weights previously, but now has a new scale and plans to check his weight daily -would hold off on recommending SGLT2i since pt does not have LV structural changes (LVH or L atrial dilation) on most recent echo  HTN -pt was started on amlodipine 10mg  daily a few  months ago and reports elevated BP at home, however his repeat BP today was WNL -continue amlodipine; see plan for CHF  Gout -pt reports taking ibuprofen for knee pain and pain associated with gout flares; encourage pt to continue to avoid gout triggers as well as avoiding ibuprofen and other NSAIDs; informed the pt to switch to Tylenol  Time spent: 10 minutes  Sherilyn Banker, PharmD Pharmacy Resident  05/28/2020 9:49 AM    Current Outpatient Medications:  .  amLODipine (NORVASC)  10 MG tablet, Take 1 tablet by mouth once daily, Disp: 90 tablet, Rfl: 3 .  carvedilol (COREG) 25 MG tablet, Take 1 tablet (25 mg total) by mouth 2 (two) times daily with a meal., Disp: 180 tablet, Rfl: 3 .  furosemide (LASIX) 40 MG tablet, Take 1 tablet (40 mg total) by mouth daily., Disp: 90 tablet, Rfl: 3 .  ibuprofen (ADVIL) 200 MG tablet, Take 800 mg by mouth every 6 (six) hours as needed for mild pain or moderate pain (gout pain)., Disp: , Rfl:  .  sacubitril-valsartan (ENTRESTO) 97-103 MG, Take 1 tablet by mouth 2 (two) times daily., Disp: 180 tablet, Rfl: 3   COUNSELING POINTS/CLINICAL PEARLS  Carvedilol (Goal: weight less than 85 kg is 25 mg BID, weight greater than 85 kg is 50 mg BID)  Patient should avoid activities requiring coordination until drug effects are realized, as drug may cause dizziness.  This drug may cause diarrhea, nausea, vomiting, arthralgia, back pain, myalgia, headache, vision disorder, erectile dysfunction, reduced libido, or fatigue.  Instruct patient to report signs/symptoms of adverse cardiovascular effects such as hypotension (especially in elderly patients), arrhythmias, syncope, palpitations, angina, or edema.  Drug may mask symptoms of hypoglycemia. Advise diabetic patients to carefully monitor blood sugar levels.  Patient should take drug with food.  Advise patient against sudden discontinuation of drug. Entresto (Goal: 97/103 mg twice daily)  Warn male patient to avoid pregnancy during therapy and to report a pregnancy to a physician.  Advise patient to report symptomatic hypotension.  Side effects may include hyperkalemia, cough, dizziness, or renal failure. Furosemide  Drug causes sun-sensitivity. Advise patient to use sunscreen and avoid tanning beds. Patient should avoid activities requiring coordination until drug effects are realized, as drug may cause dizziness, vertigo, or blurred vision. This drug may cause hyperglycemia, hyperuricemia,  constipation, diarrhea, loss of appetite, nausea, vomiting, purpuric disorder, cramps, spasticity, asthenia, headache, paresthesia, or scaling eczema. Instruct patient to report unusual bleeding/bruising or signs/symptoms of hypotension, infection, pancreatitis, or ototoxicity (tinnitus, hearing impairment). Advise patient to report signs/symptoms of a severe skin reactions (flu-like symptoms, spreading red rash, or skin/mucous membrane blistering) or erythema multiforme. Instruct patient to eat high-potassium foods during drug therapy, as directed by healthcare professional.  Patient should not drink alcohol while taking this drug.  DRUGS TO AVOID IN HEART FAILURE  Drug or Class Mechanism  Analgesics . NSAIDs . COX-2 inhibitors . Glucocorticoids  Sodium and water retention, increased systemic vascular resistance, decreased response to diuretics   Diabetes Medications . Metformin . Thiazolidinediones o Rosiglitazone (Avandia) o Pioglitazone (Actos) . DPP4 Inhibitors o Saxagliptin (Onglyza) o Sitagliptin (Januvia)   Lactic acidosis Possible calcium channel blockade   Unknown  Antiarrhythmics . Class I  o Flecainide o Disopyramide . Class III o Sotalol . Other o Dronedarone  Negative inotrope, proarrhythmic   Proarrhythmic, beta blockade  Negative inotrope  Antihypertensives . Alpha Blockers o Doxazosin . Calcium Channel Blockers o Diltiazem o Verapamil o Nifedipine . Central Alpha Adrenergics  o Moxonidine . Peripheral Vasodilators o Minoxidil  Increases renin and aldosterone  Negative inotrope    Possible sympathetic withdrawal  Unknown  Anti-infective . Itraconazole . Amphotericin B  Negative inotrope Unknown  Hematologic . Anagrelide . Cilostazol   Possible inhibition of PD IV Inhibition of PD III causing arrhythmias  Neurologic/Psychiatric . Stimulants . Anti-Seizure  Drugs o Carbamazepine o Pregabalin . Antidepressants o Tricyclics o Citalopram . Parkinsons o Bromocriptine o Pergolide o Pramipexole . Antipsychotics o Clozapine . Antimigraine o Ergotamine o Methysergide . Appetite suppressants . Bipolar o Lithium  Peripheral alpha and beta agonist activity  Negative inotrope and chronotrope Calcium channel blockade  Negative inotrope, proarrhythmic Dose-dependent QT prolongation  Excessive serotonin activity/valvular damage Excessive serotonin activity/valvular damage Unknown  IgE mediated hypersensitivy, calcium channel blockade  Excessive serotonin activity/valvular damage Excessive serotonin activity/valvular damage Valvular damage  Direct myofibrillar degeneration, adrenergic stimulation  Antimalarials . Chloroquine . Hydroxychloroquine Intracellular inhibition of lysosomal enzymes  Urologic Agents . Alpha Blockers o Doxazosin o Prazosin o Tamsulosin o Terazosin  Increased renin and aldosterone  Adapted from Page RL, et al. "Drugs That May Cause or Exacerbate Heart Failure: A Scientific Statement from the [place]." Circulation 2016; 616:W73-X10. DOI: 10.1161/CIR.0000000000000426   MEDICATION ADHERENCES TIPS AND STRATEGIES 1. Taking medication as prescribed improves patient outcomes in heart failure (reduces hospitalizations, improves symptoms, increases survival) 2. Side effects of medications can be managed by decreasing doses, switching agents, stopping drugs, or adding additional therapy. Please let someone in the [facility] know if you have having bothersome side effects so we can modify your regimen. Do not alter your medication regimen without talking to [place].  3. Medication reminders can help patients remember to take drugs on time. If you are missing or forgetting doses you can try linking behaviors, using pill boxes, or an electronic reminder like an alarm on your phone or an app. Some  people can also get automated phone calls as medication reminders.

## 2020-05-28 NOTE — Progress Notes (Addendum)
Patient ID: Noah Rodriguez, male    DOB: 08-14-1975, 45 y.o.   MRN: [id number]  HPI  Noah Rodriguez is a 45 y/o male with a history of obstructive sleep apnea, obesity, hypokalemia, gout, asthma and chronic heart failure.   Echo done 08/25/16 reviewed and shows an EF of 60-65%. Echo done 02/14/16 and showed an EF of 35-40% along with mild Noah/TR.    Last cardiac catheterization was done 09/17/14 and showed an EF of 20-25% at that time. No CAD present.   Has not been admitted or been in the ED in the last 6 months.    He presents today for a follow-up visit with a chief complaint of minimal shortness of breath upon moderate exertion. He describes this as chronic in nature having been present for several years. He has associated pedal edema and knee pain along with this. He denies any difficulty sleeping, dizziness, abdominal distention, palpitations, chest pain, cough, fatigue or weight gain.   Took his medications ~ 5 hours prior to his appointment. Admits that he's not very active because his knees hurt.   Past Medical History:  Diagnosis Date  . Acute kidney injury (HCC)   . Asthma   . Chronic combined systolic and diastolic CHF (congestive heart failure) (HCC)    EF < 25% on Cath (09/17/2014)  . Gout   . Gout of big toe 09/16/2014  . H/O medication noncompliance   . Hypertension   . Hypertensive heart disease   . Hypokalemia   . MI (myocardial infarction) [facility])    Patient denies having had a MI  . NICM (nonischemic cardiomyopathy) (HCC)    a. cardiac cath 09/2014 with normal coronary arteries, EF 20-25% with severe global HK; b. echo 08/2014: EF 30-35%, false tendon in LV aepx of no clinical sig, diffuse HK, GR1DD, aortic root 40 mm, trivial Noah, trivial pericardial effusion posterior   . Obesity, Class III, BMI 40-49.9 (morbid obesity) (HCC)   . Sleep apnea    a. sleep study 05/2010; b. noncompliant with CPAP  . Ventral hernia    a. incarcerated omentum 06/10/2015   Past Surgical  History:  Procedure Laterality Date  . CARDIAC CATHETERIZATION N/A 09/17/2014   Procedure: Right/Left Heart Cath and Coronary Angiography;  Surgeon: Lennette Bihari, MD;  Location: [facility] INVASIVE CV LAB;  Service: Cardiovascular;  Laterality: N/A;  . VENTRAL HERNIA REPAIR N/A 06/11/2015   Procedure: HERNIA REPAIR VENTRAL ADULT;  Surgeon: Lattie Haw, MD;  Location: [facility] ORS;  Service: General;  Laterality: N/A;   Family History  Problem Relation Age of Onset  . HIV Mother   . Hypertension Mother   . Hypertension Other   . Hypertension Maternal Grandmother   . Hypertension Paternal Grandmother   . Alcohol abuse Neg Hx   . Cancer Neg Hx   . Early death Neg Hx   . Heart disease Neg Hx   . Hyperlipidemia Neg Hx   . Kidney disease Neg Hx   . Stroke Neg Hx   . Heart attack Neg Hx    Social History   Tobacco Use  . Smoking status: Never Smoker  . Smokeless tobacco: Never Used  Substance Use Topics  . Alcohol use: No   No Known Allergies  Prior to Admission medications   Medication Sig Start Date End Date Taking? Authorizing Provider  amLODipine (NORVASC) 10 MG tablet Take 1 tablet by mouth once daily 02/21/20  Yes Shawni Volkov, Inetta Fermo A, FNP  carvedilol (COREG) 25 MG  tablet Take 1 tablet (25 mg total) by mouth 2 (two) times daily with a meal. 12/10/19  Yes Sinia Antosh A, FNP  furosemide (LASIX) 40 MG tablet Take 1 tablet (40 mg total) by mouth daily. 12/10/19  Yes Ronetta Molla, Inetta Fermo A, FNP  ibuprofen (ADVIL) 200 MG tablet Take 800 mg by mouth every 6 (six) hours as needed for mild pain or moderate pain (gout pain).   Yes [provider]  sacubitril-valsartan (ENTRESTO) 97-103 MG Take 1 tablet by mouth 2 (two) times daily. 06/22/17  Yes Delma Freeze, FNP    Review of Systems  Constitutional: Negative for appetite change and fatigue.  HENT: Negative for congestion, postnasal drip and sore throat.   Eyes: Negative for pain and visual disturbance.  Respiratory: Positive for shortness  of breath (minimal). Negative for cough, chest tightness and wheezing.   Cardiovascular: Positive for leg swelling (minimal). Negative for chest pain and palpitations.  Gastrointestinal: Negative for abdominal distention, abdominal pain and constipation.  Endocrine: Negative.   Genitourinary: Negative.   Musculoskeletal: Positive for arthralgias (knees). Negative for back pain and neck pain.  Skin: Negative.   Allergic/Immunologic: Negative.   Neurological: Negative for dizziness, weakness, light-headedness, numbness and headaches.  Hematological: Negative for adenopathy. Does not bruise/bleed easily.  Psychiatric/Behavioral: Negative for dysphoric mood and sleep disturbance (works night shift). The patient is not nervous/anxious.    Vitals:   05/28/20 0825 05/28/20 0902  BP: (!) 165/97 122/82  Pulse: 83   Resp: 18   SpO2: 99%   Weight: (!) 347 lb 6 oz (157.6 kg)   Height: 5\' 7"  (1.702 m)    Wt Readings from Last 3 Encounters:  05/28/20 (!) 347 lb 6 oz (157.6 kg)  02/19/20 (!) 348 lb (157.9 kg)  11/15/19 (!) 344 lb (156 kg)   Lab Results  Component Value Date   CREATININE 1.30 (H) 02/15/2019   CREATININE 1.39 (H) 08/05/2017   CREATININE 1.53 (H) 06/22/2017    Physical Exam Vitals and nursing note reviewed.  Constitutional:      Appearance: He is well-developed.  HENT:     Head: Normocephalic and atraumatic.  Neck:     Vascular: No JVD.  Cardiovascular:     Rate and Rhythm: Normal rate and regular rhythm.  Pulmonary:     Effort: Pulmonary effort is normal.     Breath sounds: No wheezing or rales.  Abdominal:     General: There is no distension.     Palpations: Abdomen is soft.     Tenderness: There is no abdominal tenderness.  Musculoskeletal:        General: No tenderness.     Cervical back: Normal range of motion and neck supple.  Skin:    General: Skin is warm and dry.  Neurological:     Mental Status: He is alert and oriented to person, place, and time.   Psychiatric:        Behavior: Behavior normal.        Thought Content: Thought content normal.     Assessment & Plan:  1: Chronic heart failure with preserved ejection fraction- - NYHA class II - euvolemic - not weighing daily but does have scales; says that he's just too tired when getting off work to weigh; encouraged him to resume weighing daily so that he can call for an overnight weight gain of >2 pounds or a weekly weight gain of >5 pounds - weight stable from last visit here 3 months ago - drinking  more water but does drink sprite and tea on occasion - continues to not add any salt to his food - has become more active at work but knows that he needs to get more consistent exercise in - discussed that we really need to update his echo (last one done 2018) but he doesn't want to schedule it right now as he still remains uninsured - pro-BNP on 11/04/19 was 82   2: HTN- - BP initially elevated but improved upon recheck with a manual cuff after checking it at the end of the visit - says that the PCP office in [place] told him he made too much money for sliding scale payments - contact information put on his AVS for [facility] as well as [facility] in [place] - suspect that this is multifactorial due to working at night, uncontrolled OSA, stress related to work  - [facility] 11/04/19 reviewed and showed Na 138, K 4.5, GFR 75 and Scr 1.33  3: Gout of right knee- - instructed him to not use ibuprofen but to try OTC volatren gel instead - CRP level on 11/05/19 elevated at 59.83   Medication bottles were reviewed. Will need to get lab work if not done elsewhere.   Return in 3 months or sooner for any questions/problems before then.

## 2020-05-28 NOTE — Addendum Note (Signed)
Addended by: Clarisa Kindred A on: 05/28/2020 10:36 AM   Modules accepted: Level of Service

## 2020-05-30 ENCOUNTER — Telehealth: Payer: Self-pay | Admitting: Family

## 2020-05-30 NOTE — Telephone Encounter (Signed)
Spoke with patient's fiance', per his request, to discuss his weight. Advised her that he needed to build exercise into his daily routine and to watch his diet closely so that he can begin to lose weight. Losing weight will only help his BP and his heart.   Also told her that he needed to get set up with PCP and that I had given him a couple of options for him to look into. Also explained that we needed to get updated echo done but that he was hesitant to do that because he is still uninsured and was concerned about the cost.   She was appreciative of the information.

## 2020-08-27 NOTE — Progress Notes (Signed)
Patient ID: Noah Rodriguez, male    DOB: 1975-04-10, 45 y.o.   MRN: [id number]  HPI  Noah Rodriguez is Rodriguez 45 y/o male with Rodriguez history of obstructive sleep apnea, obesity, hypokalemia, gout, asthma and chronic heart failure.   Echo done 08/25/16 reviewed and shows an EF of 60-65%. Echo done 02/14/16 and showed an EF of 35-40% along with mild Noah/TR.    Last cardiac catheterization was done 09/17/14 and showed an EF of 20-25% at that time. No CAD present.   Has not been admitted or been in the ED in the last 6 months.    He presents today for Rodriguez follow-up visit with Rodriguez chief complaint of minimal shortness of breath upon moderate exertion. He describes this as chronic in nature having been present for several years. He has associated head congestion, pedal edema, knee pain and continued gradual weight gain along with this. He denies any difficulty sleeping (although works 3rd shift), dizziness, abdominal distention, palpitations, chest pain, cough or fatigue.   He says that he worked last night but was able to take Rodriguez nap during his break. He admits to resuming drinking sweet tea and pepsi and hasn't been exercising. Says that he tends to go to [employer] to eat and will get french fries, cheeseburger and chicken nuggets.    Has not gotten established with Rodriguez PCP yet because his boss told him that the place he was looking at, the provider did not come to the hospital to see patients.   Past Medical History:  Diagnosis Date   Acute kidney injury (HCC)    Asthma    Chronic combined systolic and diastolic CHF (congestive heart failure) (HCC)    EF < 25% on Cath (09/17/2014)   Gout    Gout of big toe 09/16/2014   H/O medication noncompliance    Hypertension    Hypertensive heart disease    Hypokalemia    MI (myocardial infarction) [facility])    Patient denies having had Rodriguez MI   NICM (nonischemic cardiomyopathy) (HCC)    Rodriguez. cardiac cath 09/2014 with normal coronary arteries, EF 20-25% with severe global HK; b. echo  08/2014: EF 30-35%, false tendon in LV aepx of no clinical sig, diffuse HK, GR1DD, aortic root 40 mm, trivial Noah, trivial pericardial effusion posterior    Obesity, Class III, BMI 40-49.9 (morbid obesity) (HCC)    Sleep apnea    Rodriguez. sleep study 05/2010; b. noncompliant with CPAP   Ventral hernia    Rodriguez. incarcerated omentum 06/10/2015   Past Surgical History:  Procedure Laterality Date   CARDIAC CATHETERIZATION N/Rodriguez 09/17/2014   Procedure: Right/Left Heart Cath and Coronary Angiography;  Surgeon: Noah Bihari, MD;  [facility];  Service: Cardiovascular;  Laterality: N/Rodriguez;   VENTRAL HERNIA REPAIR N/Rodriguez 06/11/2015   Procedure: HERNIA REPAIR VENTRAL ADULT;  Surgeon: Noah Haw, MD;  Location: [facility] ORS;  Service: General;  Laterality: N/Rodriguez;   Family History  Problem Relation Age of Onset   HIV Mother    Hypertension Mother    Hypertension Other    Hypertension Maternal Grandmother    Hypertension Paternal Grandmother    Alcohol abuse Neg Hx    Cancer Neg Hx    Early death Neg Hx    Heart disease Neg Hx    Hyperlipidemia Neg Hx    Kidney disease Neg Hx    Stroke Neg Hx    Heart attack Neg Hx    Social History  Tobacco Use   Smoking status: Never   Smokeless tobacco: Never  Substance Use Topics   Alcohol use: No   No Known Allergies  Prior to Admission medications   Medication Sig Start Date End Date Taking? Authorizing Provider  amLODipine (NORVASC) 10 MG tablet Take 1 tablet by mouth once daily 02/21/20  Yes Noah Rodriguez  carvedilol (COREG) 25 MG tablet Take 1 tablet (25 mg total) by mouth 2 (two) times daily with Rodriguez meal. 12/10/19  Yes Noah Rodriguez  furosemide (LASIX) 40 MG tablet Take 1 tablet (40 mg total) by mouth daily. 12/10/19  Yes Noah Rodriguez  ibuprofen (ADVIL) 200 MG tablet Take 800 mg by mouth every 6 (six) hours as needed for mild pain or moderate pain (gout pain).   Yes [provider]  sacubitril-valsartan (ENTRESTO)  97-103 MG Take 1 tablet by mouth 2 (two) times daily. 06/22/17  Yes Noah Rodriguez    Review of Systems  Constitutional:  Negative for appetite change and fatigue.  HENT:  Positive for congestion. Negative for postnasal drip and sore throat.   Eyes:  Negative for pain and visual disturbance.  Respiratory:  Positive for shortness of breath (minimal). Negative for cough, chest tightness and wheezing.   Cardiovascular:  Positive for leg swelling (minimal). Negative for chest pain and palpitations.  Gastrointestinal:  Negative for abdominal distention, abdominal pain and constipation.  Endocrine: Negative.   Genitourinary: Negative.   Musculoskeletal:  Positive for arthralgias (knees). Negative for back pain and neck pain.  Skin: Negative.   Allergic/Immunologic: Negative.   Neurological:  Negative for dizziness, weakness, light-headedness, numbness and headaches.  Hematological:  Negative for adenopathy. Does not bruise/bleed easily.  Psychiatric/Behavioral:  Negative for dysphoric mood and sleep disturbance (works night shift). The patient is not nervous/anxious.    Vitals:   08/28/20 0817  BP: 129/78  Pulse: 77  Resp: 18  SpO2: 98%  Weight: (!) 355 lb 6 oz (161.2 kg)  Height: 6' (1.829 m)   Wt Readings from Last 3 Encounters:  08/28/20 (!) 355 lb 6 oz (161.2 kg)  05/28/20 (!) 347 lb 6 oz (157.6 kg)  02/19/20 (!) 348 lb (157.9 kg)   Lab Results  Component Value Date   CREATININE 1.30 (H) 02/15/2019   CREATININE 1.39 (H) 08/05/2017   CREATININE 1.53 (H) 06/22/2017    Physical Exam Vitals and nursing note reviewed.  Constitutional:      Appearance: He is well-developed.  HENT:     Head: Normocephalic and atraumatic.  Neck:     Vascular: No JVD.  Cardiovascular:     Rate and Rhythm: Normal rate and regular rhythm.  Pulmonary:     Effort: Pulmonary effort is normal.     Breath sounds: No wheezing or rales.  Abdominal:     General: There is no distension.      Palpations: Abdomen is soft.     Tenderness: There is no abdominal tenderness.  Musculoskeletal:        General: No tenderness.     Cervical back: Normal range of motion and neck supple.  Skin:    General: Skin is warm and dry.  Neurological:     Mental Status: He is alert and oriented to person, place, and time.  Psychiatric:        Behavior: Behavior normal.        Thought Content: Thought content normal.    Assessment & Plan:  1: Chronic heart  failure with preserved ejection fraction- - NYHA class II - euvolemic - not weighing daily but does have scales; says that he's just too tired when getting off work to weigh; encouraged him to resume weighing daily so that he can call for an overnight weight gain of >2 pounds or Rodriguez weekly weight gain of >5 pounds - weight up 8 pounds from last visit here 3 months ago - has resumed drinking sweet tea and pepsi; reviewed the unnecessary calories from all that sugar; he is aware that he's drinking too many sugared drinks and eating too much of the wrong foods - not adding any salt to his food - has become more active at work but knows that he needs to get more consistent exercise in - discussed that we really need to update his echo (last one done 2018) but he doesn't want to schedule it right now as he still remains uninsured - pro-BNP on 11/04/19 was 82   2: HTN- - BP looks great today (he says that he took Rodriguez nap on his break last night at work) - emphasized, again, that he must get Rodriguez PCP; explained that the PCP's in the community do not come to the hospital if he was admitted, that it wouldn't matter which PCP office he would go to, he would be admitted to the hospitalist group - he voices understanding and says that he will get Rodriguez PCP - suspect that this is multifactorial due to working at night, uncontrolled OSA, stress related to work  - [employer] 11/04/19 reviewed and showed Na 138, K 4.5, GFR 75 and Scr 1.33; need to update lab work but he's  hesitant due to the cost   Medication bottles were reviewed.   Return in 3 months or sooner for any questions/problems before then.

## 2020-08-28 ENCOUNTER — Ambulatory Visit: Payer: Self-pay | Attending: Family | Admitting: Family

## 2020-08-28 ENCOUNTER — Other Ambulatory Visit: Payer: Self-pay

## 2020-08-28 ENCOUNTER — Encounter: Payer: Self-pay | Admitting: Family

## 2020-08-28 VITALS — BP 129/78 | HR 77 | Resp 18 | Ht 72.0 in | Wt 355.4 lb

## 2020-08-28 DIAGNOSIS — Z6841 Body Mass Index (BMI) 40.0 and over, adult: Secondary | ICD-10-CM | POA: Insufficient documentation

## 2020-08-28 DIAGNOSIS — I110 Hypertensive heart disease with heart failure: Secondary | ICD-10-CM | POA: Insufficient documentation

## 2020-08-28 DIAGNOSIS — Z8249 Family history of ischemic heart disease and other diseases of the circulatory system: Secondary | ICD-10-CM | POA: Insufficient documentation

## 2020-08-28 DIAGNOSIS — I252 Old myocardial infarction: Secondary | ICD-10-CM | POA: Insufficient documentation

## 2020-08-28 DIAGNOSIS — Z791 Long term (current) use of non-steroidal anti-inflammatories (NSAID): Secondary | ICD-10-CM | POA: Insufficient documentation

## 2020-08-28 DIAGNOSIS — J45909 Unspecified asthma, uncomplicated: Secondary | ICD-10-CM | POA: Insufficient documentation

## 2020-08-28 DIAGNOSIS — I5042 Chronic combined systolic (congestive) and diastolic (congestive) heart failure: Secondary | ICD-10-CM | POA: Insufficient documentation

## 2020-08-28 DIAGNOSIS — G4733 Obstructive sleep apnea (adult) (pediatric): Secondary | ICD-10-CM | POA: Insufficient documentation

## 2020-08-28 DIAGNOSIS — I10 Essential (primary) hypertension: Secondary | ICD-10-CM

## 2020-08-28 DIAGNOSIS — I5032 Chronic diastolic (congestive) heart failure: Secondary | ICD-10-CM

## 2020-08-28 DIAGNOSIS — Z79899 Other long term (current) drug therapy: Secondary | ICD-10-CM | POA: Insufficient documentation

## 2020-08-28 NOTE — Patient Instructions (Signed)
Continue weighing daily and call for an overnight weight gain of > 2 pounds or a weekly weight gain of >5 pounds. 

## 2020-11-24 ENCOUNTER — Ambulatory Visit: Payer: Self-pay | Admitting: Family

## 2020-12-08 NOTE — Progress Notes (Deleted)
Patient ID: Noah Rodriguez, male    DOB: 1975/10/08, 45 y.o.   MRN: [id number]  HPI  Noah Rodriguez is a 45 y/o male with a history of obstructive sleep apnea, obesity, hypokalemia, gout, asthma and chronic heart failure.   Echo done 08/25/16 reviewed and shows an EF of 60-65%. Echo done 02/14/16 and showed an EF of 35-40% along with mild Noah/TR.    Last cardiac catheterization was done 09/17/14 and showed an EF of 20-25% at that time. No CAD present.   Has not been admitted or been in the ED in the last 6 months.    He presents today for a follow-up visit with a chief complaint of   Past Medical History:  Diagnosis Date   Acute kidney injury ([place])    Asthma    Chronic combined systolic and diastolic CHF (congestive heart failure) (HCC)    EF < 25% on Cath (09/17/2014)   Gout    Gout of big toe 09/16/2014   H/O medication noncompliance    Hypertension    Hypertensive heart disease    Hypokalemia    MI (myocardial infarction) [facility])    Patient denies having had a MI   NICM (nonischemic cardiomyopathy) ([place])    a. cardiac cath 09/2014 with normal coronary arteries, EF 20-25% with severe global HK; b. echo 08/2014: EF 30-35%, false tendon in LV aepx of no clinical sig, diffuse HK, GR1DD, aortic root 40 mm, trivial Noah, trivial pericardial effusion posterior    Obesity, Class III, BMI 40-49.9 (morbid obesity) ([place])    Sleep apnea    a. sleep study 05/2010; b. noncompliant with CPAP   Ventral hernia    a. incarcerated omentum 06/10/2015   Past Surgical History:  Procedure Laterality Date   CARDIAC CATHETERIZATION N/A 09/17/2014   Procedure: Right/Left Heart Cath and Coronary Angiography;  Surgeon: Troy Sine, MD;  Location: [facility];  Service: Cardiovascular;  Laterality: N/A;   VENTRAL HERNIA REPAIR N/A 06/11/2015   Procedure: HERNIA REPAIR VENTRAL ADULT;  Surgeon: Florene Glen, MD;  Location: [facility] ORS;  Service: General;  Laterality: N/A;   Family History  Problem Relation  Age of Onset   HIV Mother    Hypertension Mother    Hypertension Other    Hypertension Maternal Grandmother    Hypertension Paternal Grandmother    Alcohol abuse Neg Hx    Cancer Neg Hx    Early death Neg Hx    Heart disease Neg Hx    Hyperlipidemia Neg Hx    Kidney disease Neg Hx    Stroke Neg Hx    Heart attack Neg Hx    Social History   Tobacco Use   Smoking status: Never   Smokeless tobacco: Never  Substance Use Topics   Alcohol use: No   No Known Allergies    Review of Systems  Constitutional:  Negative for appetite change and fatigue.  HENT:  Positive for congestion. Negative for postnasal drip and sore throat.   Eyes:  Negative for pain and visual disturbance.  Respiratory:  Positive for shortness of breath (minimal). Negative for cough, chest tightness and wheezing.   Cardiovascular:  Positive for leg swelling (minimal). Negative for chest pain and palpitations.  Gastrointestinal:  Negative for abdominal distention, abdominal pain and constipation.  Endocrine: Negative.   Genitourinary: Negative.   Musculoskeletal:  Positive for arthralgias (knees). Negative for back pain and neck pain.  Skin: Negative.   Allergic/Immunologic: Negative.   Neurological:  Negative for dizziness, weakness, light-headedness, numbness and headaches.  Hematological:  Negative for adenopathy. Does not bruise/bleed easily.  Psychiatric/Behavioral:  Negative for dysphoric mood and sleep disturbance (works night shift). The patient is not nervous/anxious.       Physical Exam Vitals and nursing note reviewed.  Constitutional:      Appearance: He is well-developed.  HENT:     Head: Normocephalic and atraumatic.  Neck:     Vascular: No JVD.  Cardiovascular:     Rate and Rhythm: Normal rate and regular rhythm.  Pulmonary:     Effort: Pulmonary effort is normal.     Breath sounds: No wheezing or rales.  Abdominal:     General: There is no distension.     Palpations: Abdomen is  soft.     Tenderness: There is no abdominal tenderness.  Musculoskeletal:        General: No tenderness.     Cervical back: Normal range of motion and neck supple.  Skin:    General: Skin is warm and dry.  Neurological:     Mental Status: He is alert and oriented to person, place, and time.  Psychiatric:        Behavior: Behavior normal.        Thought Content: Thought content normal.    Assessment & Plan:  1: Chronic heart failure with preserved ejection fraction- - NYHA class II - euvolemic - not weighing daily but does have scales; says that he's just too tired when getting off work to weigh; encouraged him to resume weighing daily so that he can call for an overnight weight gain of >2 pounds or a weekly weight gain of >5 pounds - weight 355.6 pounds from last visit here 3 months ago - has resumed drinking sweet tea and pepsi; reviewed the unnecessary calories from all that sugar; he is aware that he's drinking too many sugared drinks and eating too much of the wrong foods - not adding any salt to his food - has become more active at work but knows that he needs to get more consistent exercise in - discussed that we really need to update his echo (last one done 2018) but he doesn't want to schedule it right now as he still remains uninsured - pro-BNP on 11/04/19 was 82   2: HTN- - BP  - emphasized, again, that he must get a PCP; explained that the PCP's in the community do not come to the hospital if he was admitted, that it wouldn't matter which PCP office he would go to, he would be admitted to the hospitalist group - he voices understanding and says that he will get a PCP - suspect that this is multifactorial due to working at night, uncontrolled OSA, stress related to work  - [employer] 11/04/19 reviewed and showed Na 138, K 4.5, GFR 75 and Scr 1.33 - repeat labs today   Medication bottles were reviewed.

## 2020-12-09 ENCOUNTER — Ambulatory Visit: Payer: Self-pay | Admitting: Family

## 2020-12-17 ENCOUNTER — Encounter: Payer: Self-pay | Admitting: Family

## 2020-12-17 ENCOUNTER — Other Ambulatory Visit: Payer: Self-pay

## 2020-12-17 ENCOUNTER — Other Ambulatory Visit
Admission: RE | Admit: 2020-12-17 | Discharge: 2020-12-17 | Disposition: A | Discharge status: Home / Self Care | Payer: Self-pay | Source: Ambulatory Visit | Attending: Family | Admitting: Family

## 2020-12-17 ENCOUNTER — Ambulatory Visit: Payer: Self-pay | Attending: Family | Admitting: Family

## 2020-12-17 VITALS — BP 148/93 | HR 79 | Resp 20 | Ht 67.0 in | Wt 366.4 lb

## 2020-12-17 DIAGNOSIS — Z566 Other physical and mental strain related to work: Secondary | ICD-10-CM | POA: Insufficient documentation

## 2020-12-17 DIAGNOSIS — I5032 Chronic diastolic (congestive) heart failure: Secondary | ICD-10-CM

## 2020-12-17 DIAGNOSIS — M109 Gout, unspecified: Secondary | ICD-10-CM | POA: Insufficient documentation

## 2020-12-17 DIAGNOSIS — I10 Essential (primary) hypertension: Secondary | ICD-10-CM

## 2020-12-17 DIAGNOSIS — G4733 Obstructive sleep apnea (adult) (pediatric): Secondary | ICD-10-CM | POA: Insufficient documentation

## 2020-12-17 DIAGNOSIS — I110 Hypertensive heart disease with heart failure: Secondary | ICD-10-CM | POA: Insufficient documentation

## 2020-12-17 DIAGNOSIS — I081 Rheumatic disorders of both mitral and tricuspid valves: Secondary | ICD-10-CM | POA: Insufficient documentation

## 2020-12-17 DIAGNOSIS — Z597 Insufficient social insurance and welfare support: Secondary | ICD-10-CM | POA: Insufficient documentation

## 2020-12-17 DIAGNOSIS — J45909 Unspecified asthma, uncomplicated: Secondary | ICD-10-CM | POA: Insufficient documentation

## 2020-12-17 DIAGNOSIS — Z6841 Body Mass Index (BMI) 40.0 and over, adult: Secondary | ICD-10-CM | POA: Insufficient documentation

## 2020-12-17 DIAGNOSIS — E876 Hypokalemia: Secondary | ICD-10-CM | POA: Insufficient documentation

## 2020-12-17 LAB — BASIC METABOLIC PANEL
Anion gap: 7 (ref 5–15)
BUN: 18 mg/dL (ref 6–20)
CO2: 27 mmol/L (ref 22–32)
Calcium: 8.7 mg/dL — ABNORMAL LOW (ref 8.9–10.3)
Chloride: 101 mmol/L (ref 98–111)
Creatinine, Ser: 1.22 mg/dL (ref 0.61–1.24)
GFR, Estimated: >60 mL/min (ref >60)
Glucose, Bld: 121 mg/dL — ABNORMAL HIGH (ref 70–99)
Potassium: 3.7 mmol/L (ref 3.5–5.1)
Sodium: 135 mmol/L (ref 135–145)

## 2020-12-17 NOTE — Patient Instructions (Signed)
Continue weighing daily and call for an overnight weight gain of 3 pounds or more or a weekly weight gain of more than 5 pounds.  °

## 2020-12-17 NOTE — Progress Notes (Signed)
Patient ID: Noah Rodriguez, male    DOB: August 05, 1975, 45 y.o.   MRN: [id number]  HPI  Noah Rodriguez is a 45 y/o male with a history of obstructive sleep apnea, obesity, hypokalemia, gout, asthma and chronic heart failure.   Echo done 08/25/16 reviewed and shows an EF of 60-65%. Echo done 02/14/16 and showed an EF of 35-40% along with mild Noah/TR.    Last cardiac catheterization was done 09/17/14 and showed an EF of 20-25% at that time. No CAD present.   Has not been admitted or been in the ED in the last 6 months.    He presents today for a follow-up visit with a chief complaint of minimal shortness of breath upon moderate exertion. He describes this as chronic in nature having been present for several years. He has associated head congestion, minimal pedal edema (usually after being at work), leg pain and gradual weight gain along with this. He denies any difficulty sleeping, dizziness, abdominal distention, palpitations, chest pain, cough or fatigue.   He is still uninsured and hasn't found a PCP yet. Says that he may just go to an Urgent Care "to get checked out".   Continues to work third shift and says that he's getting no exercise because he's tired by the time he gets home. He tends to go through fast food restaurant in the mornings to get breakfast on the way home. He's been drinking more sweet tea and pepsi so that the caffeine can keep him awake.   Past Medical History:  Diagnosis Date   Acute kidney injury (HCC)    Asthma    Chronic combined systolic and diastolic CHF (congestive heart failure) (HCC)    EF < 25% on Cath (09/17/2014)   Gout    Gout of big toe 09/16/2014   H/O medication noncompliance    Hypertension    Hypertensive heart disease    Hypokalemia    MI (myocardial infarction) [facility])    Patient denies having had a MI   NICM (nonischemic cardiomyopathy) (HCC)    a. cardiac cath 09/2014 with normal coronary arteries, EF 20-25% with severe global HK; b. echo 08/2014: EF  30-35%, false tendon in LV aepx of no clinical sig, diffuse HK, GR1DD, aortic root 40 mm, trivial Noah, trivial pericardial effusion posterior    Obesity, Class III, BMI 40-49.9 (morbid obesity) (HCC)    Sleep apnea    a. sleep study 05/2010; b. noncompliant with CPAP   Ventral hernia    a. incarcerated omentum 06/10/2015   Past Surgical History:  Procedure Laterality Date   CARDIAC CATHETERIZATION N/A 09/17/2014   Procedure: Right/Left Heart Cath and Coronary Angiography;  Surgeon: Lennette Bihari, MD;  [facility]: [facility];  Service: Cardiovascular;  Laterality: N/A;   VENTRAL HERNIA REPAIR N/A 06/11/2015   Procedure: HERNIA REPAIR VENTRAL ADULT;  Surgeon: Lattie Haw, MD;  Location: [facility] OR[facility];  Service: General;  Laterality: N/A;   Family History  Problem Relation Age of Onset   HIV Mother    Hypertension Mother    Hypertension Other    Hypertension Maternal Grandmother    Hypertension Paternal Grandmother    Alcohol abuse Neg Hx    Cancer Neg Hx    Early death Neg Hx    Heart disease Neg Hx    Hyperlipidemia Neg Hx    Kidney disease Neg Hx    Stroke Neg Hx    Heart attack Neg Hx    Social History  Tobacco Use   Smoking status: Never   Smokeless tobacco: Never  Substance Use Topics   Alcohol use: No   No Known Allergies  Prior to Admission medications   Medication Sig Start Date End Date Taking? Authorizing Provider  amLODipine (NORVASC) 10 MG tablet Take 1 tablet by mouth once daily 02/21/20  Yes Clarisa Kindred A, FNP  carvedilol (COREG) 25 MG tablet Take 1 tablet (25 mg total) by mouth 2 (two) times daily with a meal. 12/10/19  Yes Clarisa Kindred A, FNP  furosemide (LASIX) 40 MG tablet Take 1 tablet (40 mg total) by mouth daily. 12/10/19  Yes Loisann Roach, Inetta Fermo A, FNP  ibuprofen (ADVIL) 200 MG tablet Take 800 mg by mouth every 6 (six) hours as needed for mild pain or moderate pain (gout pain).   Yes [provider]  sacubitril-valsartan (ENTRESTO) 97-103 MG  Take 1 tablet by mouth 2 (two) times daily. 06/22/17  Yes Delma Freeze, FNP   Review of Systems  Constitutional:  Negative for appetite change and fatigue.  HENT:  Positive for congestion. Negative for postnasal drip and sore throat.   Eyes:  Negative for pain and visual disturbance.  Respiratory:  Positive for shortness of breath (minimal). Negative for cough, chest tightness and wheezing.   Cardiovascular:  Positive for leg swelling (minimal). Negative for chest pain and palpitations.  Gastrointestinal:  Negative for abdominal distention, abdominal pain and constipation.  Endocrine: Negative.   Genitourinary: Negative.   Musculoskeletal:  Positive for arthralgias (leg pain). Negative for back pain and neck pain.  Skin: Negative.   Allergic/Immunologic: Negative.   Neurological:  Negative for dizziness, weakness, light-headedness, numbness and headaches.  Hematological:  Negative for adenopathy. Does not bruise/bleed easily.  Psychiatric/Behavioral:  Negative for dysphoric mood and sleep disturbance (works night shift). The patient is not nervous/anxious.    Vitals:   12/17/20 0956  BP: (!) 148/93  Pulse: 79  Resp: 20  SpO2: 97%  Weight: (!) 366 lb 6 oz (166.2 kg)  Height: 5\' 7"  (1.702 m)   Wt Readings from Last 3 Encounters:  12/17/20 (!) 366 lb 6 oz (166.2 kg)  08/28/20 (!) 355 lb 6 oz (161.2 kg)  05/28/20 (!) 347 lb 6 oz (157.6 kg)   Lab Results  Component Value Date   CREATININE 1.30 (H) 02/15/2019   CREATININE 1.39 (H) 08/05/2017   CREATININE 1.53 (H) 06/22/2017    Physical Exam Vitals and nursing note reviewed.  Constitutional:      Appearance: He is well-developed.  HENT:     Head: Normocephalic and atraumatic.  Neck:     Vascular: No JVD.  Cardiovascular:     Rate and Rhythm: Normal rate and regular rhythm.  Pulmonary:     Effort: Pulmonary effort is normal.     Breath sounds: No wheezing or rales.  Abdominal:     General: There is no distension.      Palpations: Abdomen is soft.     Tenderness: There is no abdominal tenderness.  Musculoskeletal:        General: No tenderness.     Cervical back: Normal range of motion and neck supple.  Skin:    General: Skin is warm and dry.  Neurological:     Mental Status: He is alert and oriented to person, place, and time.  Psychiatric:        Behavior: Behavior normal.        Thought Content: Thought content normal.    Assessment & Plan:  1: Chronic heart failure with preserved ejection fraction- - NYHA class II - euvolemic - not weighing daily but does have scales; says that he's just too tired when getting off work to weigh; encouraged him to resume weighing daily so that he can call for an overnight weight gain of >2 pounds or a weekly weight gain of >5 pounds - weight up 11 pounds from last visit here 3 months ago (up 20 pounds since May 2022) - continues to drink sweet tea and pepsi because he says he needs the caffeine to stay awake; reviewed the unnecessary calories from all that sugar; he is aware that he's drinking too many sugared drinks and eating too much of the wrong foods; does drink water as well as the sugary drinks - not adding any salt to his food - has become more active at work but knows that he needs to get more consistent exercise in - discussed that we really need to update his echo (last one done 2018) but he doesn't want to schedule it right now as he still remains uninsured - pro-BNP on 11/04/19 was 82   2: HTN- - BP mildly elevated (148/93) - still uninsured and doesn't have PCP; mentions going to an Urgent Care "to get checked out"; explained it would probably be less expensive if we made him an appointment at a local PCP versus going to an Urgent Care; he says that we can look into this for him - suspect that this is multifactorial due to working at night, uncontrolled OSA, stress related to work  - [facility] 11/04/19 reviewed and showed Na 138, K 4.5, GFR 75 and Scr  1.33 - repeat labs today   Medication bottles were reviewed.   Return in 3 months or sooner for any questions/problems before then.

## 2020-12-24 ENCOUNTER — Ambulatory Visit: Payer: Self-pay | Admitting: Internal Medicine

## 2021-01-19 ENCOUNTER — Telehealth: Payer: Self-pay | Admitting: Family

## 2021-01-19 NOTE — Telephone Encounter (Signed)
Notified patient that he was not approved for [employer] patient assistance for Entresto as hie exceeds the household income limit.   Demoni Parmar, NT

## 2021-01-21 ENCOUNTER — Ambulatory Visit: Payer: Self-pay | Admitting: Internal Medicine

## 2021-01-21 ENCOUNTER — Telehealth: Payer: Self-pay

## 2021-01-21 ENCOUNTER — Encounter: Payer: Self-pay | Admitting: Internal Medicine

## 2021-01-21 VITALS — BP 118/72 | HR 92 | Temp 97.6°F | Resp 20 | Ht 67.0 in | Wt 367.3 lb

## 2021-01-21 DIAGNOSIS — G4733 Obstructive sleep apnea (adult) (pediatric): Secondary | ICD-10-CM

## 2021-01-21 DIAGNOSIS — R7303 Prediabetes: Secondary | ICD-10-CM

## 2021-01-21 DIAGNOSIS — I5022 Chronic systolic (congestive) heart failure: Secondary | ICD-10-CM

## 2021-01-21 DIAGNOSIS — J4520 Mild intermittent asthma, uncomplicated: Secondary | ICD-10-CM

## 2021-01-21 DIAGNOSIS — N182 Chronic kidney disease, stage 2 (mild): Secondary | ICD-10-CM

## 2021-01-21 DIAGNOSIS — I10 Essential (primary) hypertension: Secondary | ICD-10-CM

## 2021-01-21 DIAGNOSIS — R7309 Other abnormal glucose: Secondary | ICD-10-CM

## 2021-01-21 DIAGNOSIS — Z13220 Encounter for screening for lipoid disorders: Secondary | ICD-10-CM

## 2021-01-21 DIAGNOSIS — Z1211 Encounter for screening for malignant neoplasm of colon: Secondary | ICD-10-CM

## 2021-01-21 DIAGNOSIS — Z23 Encounter for immunization: Secondary | ICD-10-CM

## 2021-01-21 MED ORDER — ALBUTEROL SULFATE HFA 108 (90 BASE) MCG/ACT IN AERS: 2.0000 | INHALATION_SPRAY | Freq: Four times a day (QID) | RESPIRATORY_TRACT | 0 refills | Status: DC | PRN

## 2021-01-21 NOTE — Progress Notes (Signed)
New Patient Office Visit  Subjective:  Patient ID: Noah Rodriguez, male    DOB: September 23, 1975  Age: 46 y.o. MRN: [id number]  CC:  Chief Complaint  Patient presents with   Establish Care   Hypertension    HPI Noah Rodriguez presents as a new patient.   Hypertension/HFpEF: -Medications: Amlodipine 10, Coreg 25 BID, Entresto BID, Lasix 40 daily  -Patient is compliant with above medications and reports no side effects. -Checking BP at home (average): doesn't check  -Denies any SOB, CP, vision changes, LE edema or symptoms of hypotension. Can tell if BP high with headaches  -Diet: works at night;  -Last echo 08/2016: EF 60% with normal wall motion -Following with cardiology every 6 months or so, last saw in December  -Cardiac cath in 9/16, no stents or bypass needed  Asthma diagnosed in childhood:  -Asthma status: controlled -Current Treatments: none -Satisfied with current treatment?: yes -Albuterol/rescue inhaler frequency: none -Dyspnea frequency: occasional with exertion  -Wheezing frequency: occasionally  -Cough frequency: no -Nocturnal symptom frequency: no -Current upper respiratory symptoms: no -Triggers: allergies -Visits to ER or Urgent Care in past year: no -Pneumovax:  Prevnar 23 in 2014 -Influenza: Not up to Date -Sleep study several years ago - not currently using a CPAP  CKD2: -Last BMP in 12/22, creatinine 1.22  Health Maintenance: -Blood work due -Colon cancer screening: due -Vaccines due: flu, Tdap, Prevnar 20   Past Medical History:  Diagnosis Date   Acute kidney injury ([place])    Asthma    Chronic combined systolic and diastolic CHF (congestive heart failure) ([place])    EF < 25% on Cath (09/17/2014)   Gout    Gout of big toe 09/16/2014   H/O medication noncompliance    Hypertension    Hypertensive heart disease    Hypokalemia    MI (myocardial infarction) ([place])    Patient denies having had a MI   NICM (nonischemic cardiomyopathy) ([place])     a. cardiac cath 09/2014 with normal coronary arteries, EF 20-25% with severe global HK; b. echo 08/2014: EF 30-35%, false tendon in LV aepx of no clinical sig, diffuse HK, GR1DD, aortic root 40 mm, trivial MR, trivial pericardial effusion posterior    Obesity, Class III, BMI 40-49.9 (morbid obesity) ([place])    Sleep apnea    a. sleep study 05/2010; b. noncompliant with CPAP   Ventral hernia    a. incarcerated omentum 06/10/2015    Past Surgical History:  Procedure Laterality Date   CARDIAC CATHETERIZATION N/A 09/17/2014   Procedure: Right/Left Heart Cath and Coronary Angiography;  Surgeon: Troy Sine, MD;  Location: [facility];  Service: Cardiovascular;  Laterality: N/A;   VENTRAL HERNIA REPAIR N/A 06/11/2015   Procedure: HERNIA REPAIR VENTRAL ADULT;  Surgeon: Florene Glen, MD;  Location: [facility];  Service: General;  Laterality: N/A;    Family History  Problem Relation Age of Onset   HIV Mother    Hypertension Mother    Hypertension Other    Hypertension Maternal Grandmother    Hypertension Paternal Grandmother    Alcohol abuse Neg Hx    Cancer Neg Hx    Early death Neg Hx    Heart disease Neg Hx    Hyperlipidemia Neg Hx    Kidney disease Neg Hx    Stroke Neg Hx    Heart attack Neg Hx     Social History   Socioeconomic History   Marital status: Single    Spouse name:  Not on file   Number of children: Not on file   Years of education: Not on file   Highest education level: Not on file  Occupational History   Not on file  Tobacco Use   Smoking status: Never   Smokeless tobacco: Never  Vaping Use   Vaping Use: Never used  Substance and Sexual Activity   Alcohol use: No   Drug use: No   Sexual activity: Yes    Birth control/protection: Condom  Other Topics Concern   Not on file  Social History Narrative   Not on file   Social Determinants of Health   Financial Resource Strain: Not on file  Food Insecurity: Not on file  Transportation Needs: Not on  file  Physical Activity: Not on file  Stress: Not on file  Social Connections: Not on file  Intimate Partner Violence: Not on file    ROS Review of Systems  Constitutional:  Negative for chills and fever.  Eyes:  Negative for visual disturbance.  Respiratory:  Negative for cough, shortness of breath and wheezing.   Cardiovascular:  Positive for leg swelling. Negative for chest pain and palpitations.  Gastrointestinal:  Negative for abdominal pain, nausea and vomiting.  Neurological:  Negative for dizziness and headaches.   Objective:   Today's Vitals: BP 118/72    Pulse 92    Temp 97.6 F (36.4 C)    Resp 20    Ht 5\' 7"  (1.702 m)    Wt (!) 367 lb 4.8 oz (166.6 kg)    SpO2 99%    BMI 57.53 kg/m   Physical Exam Constitutional:      Appearance: Normal appearance. He is obese.  HENT:     Head: Normocephalic and atraumatic.  Eyes:     Conjunctiva/sclera: Conjunctivae normal.  Cardiovascular:     Rate and Rhythm: Normal rate and regular rhythm.  Pulmonary:     Effort: Pulmonary effort is normal.     Breath sounds: Normal breath sounds.  Musculoskeletal:     Right lower leg: Edema present.     Left lower leg: Edema present.     Comments: BLE non-pitting edema  Skin:    General: Skin is warm and dry.  Neurological:     General: No focal deficit present.     Mental Status: He is alert. Mental status is at baseline.  Psychiatric:        Mood and Affect: Mood normal.        Behavior: Behavior normal.    Assessment & Plan:   1. Essential hypertension/Chronic systolic heart failure ([place]): BP at goal, reviewed last echo and medications. Also reviewed last BMP from December. Following with Cardiology. Follow up here for recheck in 6 months.   - CBC w/Diff/Platelet  2. OSA (obstructive sleep apnea): Last sleep study several years ago but was diagnosed with OSA, not using CPAP. Referral placed to sleep medicine for repeat sleep study.  - Ambulatory referral to Pulmonology  3.  Mild intermittent asthma without complication: Stable but does have some exertional shortness of breath in the setting of HF and wheezing as well, especially with allergies. Rescue inhaler sent to pharmacy to use as needed for wheezing.  - albuterol (VENTOLIN HFA) 108 (90 Base) MCG/ACT inhaler; Inhale 2 puffs into the lungs every 6 (six) hours as needed for wheezing or shortness of breath.  Dispense: 8 g; Refill: 0  4. CKD (chronic kidney disease), stage II: Reviewed last BMP with patient, GFR >60.  5. Lipid screening/Elevated glucose: Due for lipid screening today as well as A1c.   - Lipid Profile - HgB A1c  6. Colon cancer screening: As the patient is self pay, he is still deciding between colonoscopy vs. Cologuard, he was given information about each. He is leaning towards colonoscopy, so referral placed today.  - Ambulatory referral to Gastroenterology  7. Need for Tdap vaccination: Tdap administered today.  - Tdap vaccine greater than or equal to 7yo IM   Follow-up: Return in about 6 months (around 07/21/2021).   Teodora Medici, DO

## 2021-01-21 NOTE — Patient Instructions (Addendum)
It was great seeing you today!  Plan discussed at today's visit: -Blood work ordered today, results will be uploaded to MyChart.  -Continue medications from Cardiology -Rescue inhaler sent to pharmacy -Referrals placed for Pulmonology and GI for sleep study and colonoscopy  -Tdap (tetanus vaccine today)  Follow up in: 6 months   Take care and let us know if you have any questions or concerns prior to your next visit.  Dr. Caralee Ates   Pneumonia is $82 with a admin charge of $50 for a total of $345 and then you would get a 60% discount off that for being self pay.  You do not have to pay up front and can be billed.  Also call Cologuard for colon cancer screening to check to see how much self pay patient would be. [phone].    Mediterranean Diet A Mediterranean diet refers to food and lifestyle choices that are based on the traditions of countries located on the [employer] [place]. This way of eating has been shown to help prevent certain conditions and improve outcomes for people who have chronic diseases, like kidney disease and heart disease. What are tips for following this plan? Lifestyle Cook and eat meals together with your family, when possible. Drink enough fluid to keep your urine clear or pale yellow. Be physically active every day. This includes: Aerobic exercise like running or swimming. Leisure activities like gardening, walking, or housework. Get 7-8 hours of sleep each night. If recommended by your health care provider, drink red wine in moderation. This means 1 glass a day for nonpregnant women and 2 glasses a day for men. A glass of wine equals 5 oz (150 mL). Reading food labels  Check the serving size of packaged foods. For foods such as rice and pasta, the serving size refers to the amount of cooked product, not dry. Check the total fat in packaged foods. Avoid foods that have saturated fat or trans fats. Check the ingredients list for added sugars, such as  corn syrup. Shopping At the grocery store, buy most of your food from the areas near the walls of the store. This includes: Fresh fruits and vegetables (produce). Grains, beans, nuts, and seeds. Some of these may be available in unpackaged forms or large amounts (in bulk). Fresh seafood. Poultry and eggs. Low-fat dairy products. Buy whole ingredients instead of prepackaged foods. Buy fresh fruits and vegetables in-season from local farmers markets. Buy frozen fruits and vegetables in resealable bags. If you do not have access to quality fresh seafood, buy precooked frozen shrimp or canned fish, such as tuna, salmon, or sardines. Buy small amounts of raw or cooked vegetables, salads, or olives from the deli or salad bar at your store. Stock your pantry so you always have certain foods on hand, such as olive oil, canned tuna, canned tomatoes, rice, pasta, and beans. Cooking Cook foods with extra-virgin olive oil instead of using butter or other vegetable oils. Have meat as a side dish, and have vegetables or grains as your main dish. This means having meat in small portions or adding small amounts of meat to foods like pasta or stew. Use beans or vegetables instead of meat in common dishes like chili or lasagna. Experiment with different cooking methods. Try roasting or broiling vegetables instead of steaming or sauteing them. Add frozen vegetables to soups, stews, pasta, or rice. Add nuts or seeds for added healthy fat at each meal. You can add these to yogurt, salads, or vegetable dishes. Marinate fish  or vegetables using olive oil, lemon juice, garlic, and fresh herbs. Meal planning  Plan to eat 1 vegetarian meal one day each week. Try to work up to 2 vegetarian meals, if possible. Eat seafood 2 or more times a week. Have healthy snacks readily available, such as: Vegetable sticks with hummus. Greek yogurt. Fruit and nut trail mix. Eat balanced meals throughout the week. This  includes: Fruit: 2-3 servings a day Vegetables: 4-5 servings a day Low-fat dairy: 2 servings a day Fish, poultry, or lean meat: 1 serving a day Beans and legumes: 2 or more servings a week Nuts and seeds: 1-2 servings a day Whole grains: 6-8 servings a day Extra-virgin olive oil: 3-4 servings a day Limit red meat and sweets to only a few servings a month What are my food choices? Mediterranean diet Recommended Grains: Whole-grain pasta. Brown rice. Bulgar wheat. Polenta. Couscous. Whole-wheat bread. Orpah Cobb. Vegetables: Artichokes. Beets. Broccoli. Cabbage. Carrots. Eggplant. Green beans. Chard. Kale. Spinach. Onions. Leeks. Peas. Squash. Tomatoes. Peppers. Radishes. Fruits: Apples. Apricots. Avocado. Berries. Bananas. Cherries. Dates. Figs. Grapes. Lemons. Melon. Oranges. Peaches. Plums. Pomegranate. Meats and other protein foods: Beans. Almonds. Sunflower seeds. Pine nuts. Peanuts. Cod. Salmon. Scallops. Shrimp. Tuna. Tilapia. Clams. Oysters. Eggs. Dairy: Low-fat milk. Cheese. Greek yogurt. Beverages: Water. Red wine. Herbal tea. Fats and oils: Extra virgin olive oil. Avocado oil. Grape seed oil. Sweets and desserts: [place] yogurt with honey. Baked apples. Poached pears. Trail mix. Seasoning and other foods: Basil. Cilantro. Coriander. Cumin. Mint. Parsley. Sage. Rosemary. Tarragon. Garlic. Oregano. Thyme. Pepper. Balsalmic vinegar. Tahini. Hummus. Tomato sauce. Olives. Mushrooms. Limit these Grains: Prepackaged pasta or rice dishes. Prepackaged cereal with added sugar. Vegetables: Deep fried potatoes (french fries). Fruits: Fruit canned in syrup. Meats and other protein foods: Beef. Pork. Lamb. Poultry with skin. Hot dogs. Tomasa Blase. Dairy: Ice cream. Sour cream. Whole milk. Beverages: Juice. Sugar-sweetened soft drinks. Beer. Liquor and spirits. Fats and oils: Butter. Canola oil. Vegetable oil. Beef fat (tallow). Lard. Sweets and desserts: Cookies. Cakes. Pies. Candy. Seasoning  and other foods: Mayonnaise. Premade sauces and marinades. The items listed may not be a complete list. Talk with your dietitian about what dietary choices are right for you. Summary The Mediterranean diet includes both food and lifestyle choices. Eat a variety of fresh fruits and vegetables, beans, nuts, seeds, and whole grains. Limit the amount of red meat and sweets that you eat. Talk with your health care provider about whether it is safe for you to drink red wine in moderation. This means 1 glass a day for nonpregnant women and 2 glasses a day for men. A glass of wine equals 5 oz (150 mL). This information is not intended to replace advice given to you by your health care provider. Make sure you discuss any questions you have with your health care provider. Document Revised: 08/28/2015 Document Reviewed: 08/21/2015 Elsevier Patient Education  2020 [employer].

## 2021-01-21 NOTE — Telephone Encounter (Addendum)
CALLED PATIENT NO ANSWER VOICEMAIL full

## 2021-01-22 LAB — CBC WITH DIFFERENTIAL/PLATELET
Absolute Monocytes: 594 cells/uL (ref 200–950)
Basophils Absolute: 50 cells/uL (ref 0–200)
Basophils Relative: 0.9 %
Eosinophils Absolute: 110 cells/uL (ref 15–500)
Eosinophils Relative: 2 %
HCT: 40.7 % (ref 38.5–50.0)
Hemoglobin: 13.4 g/dL (ref 13.2–17.1)
Lymphs Abs: 1821 cells/uL (ref 850–3900)
MCH: 27.9 pg (ref 27.0–33.0)
MCHC: 32.9 g/dL (ref 32.0–36.0)
MCV: 84.8 fL (ref 80.0–100.0)
MPV: 9.7 fL (ref 7.5–12.5)
Monocytes Relative: 10.8 %
Neutro Abs: 2926 cells/uL (ref 1500–7800)
Neutrophils Relative %: 53.2 %
Platelets: 343 10*3/uL (ref 140–400)
RBC: 4.8 10*6/uL (ref 4.20–5.80)
RDW: 13.1 % (ref 11.0–15.0)
Total Lymphocyte: 33.1 %
WBC: 5.5 10*3/uL (ref 3.8–10.8)

## 2021-01-22 LAB — LIPID PANEL
Cholesterol: 138 mg/dL (ref <200)
HDL: 35 mg/dL — ABNORMAL LOW (ref >=40)
LDL Cholesterol (Calc): 86 mg/dL (calc)
Non-HDL Cholesterol (Calc): 103 mg/dL (calc) (ref <130)
Total CHOL/HDL Ratio: 3.9 (calc) (ref <5.0)
Triglycerides: 83 mg/dL (ref <150)

## 2021-01-22 LAB — HEMOGLOBIN A1C
Hgb A1c MFr Bld: 6.4 % of total Hgb — ABNORMAL HIGH (ref <5.7)
Mean Plasma Glucose: 137 mg/dL
eAG (mmol/L): 7.6 mmol/L

## 2021-01-23 MED ORDER — METFORMIN HCL 500 MG PO TABS: 500.0000 mg | ORAL_TABLET | Freq: Every day | ORAL | 3 refills | Status: DC

## 2021-01-23 NOTE — Addendum Note (Signed)
Addended by: Margarita Mail on: 01/23/2021 08:55 AM   Modules accepted: Orders

## 2021-01-29 ENCOUNTER — Other Ambulatory Visit: Payer: Self-pay | Admitting: Family

## 2021-02-26 ENCOUNTER — Telehealth: Payer: Self-pay

## 2021-02-26 NOTE — Telephone Encounter (Signed)
Pt rescheduled for sleep consult in march with BW . Nothing further needed.

## 2021-02-27 ENCOUNTER — Institutional Professional Consult (permissible substitution): Payer: Self-pay | Admitting: Primary Care

## 2021-03-17 ENCOUNTER — Ambulatory Visit: Payer: Self-pay | Admitting: Family

## 2021-03-18 ENCOUNTER — Ambulatory Visit: Payer: Self-pay | Attending: Family | Admitting: Family

## 2021-03-18 ENCOUNTER — Encounter: Payer: Self-pay | Admitting: Family

## 2021-03-18 ENCOUNTER — Other Ambulatory Visit: Payer: Self-pay

## 2021-03-18 VITALS — BP 166/94 | HR 84 | Resp 18 | Ht 67.0 in | Wt 357.2 lb

## 2021-03-18 DIAGNOSIS — M109 Gout, unspecified: Secondary | ICD-10-CM | POA: Insufficient documentation

## 2021-03-18 DIAGNOSIS — M25571 Pain in right ankle and joints of right foot: Secondary | ICD-10-CM | POA: Insufficient documentation

## 2021-03-18 DIAGNOSIS — I5032 Chronic diastolic (congestive) heart failure: Secondary | ICD-10-CM | POA: Insufficient documentation

## 2021-03-18 DIAGNOSIS — I10 Essential (primary) hypertension: Secondary | ICD-10-CM

## 2021-03-18 DIAGNOSIS — I110 Hypertensive heart disease with heart failure: Secondary | ICD-10-CM | POA: Insufficient documentation

## 2021-03-18 DIAGNOSIS — J45909 Unspecified asthma, uncomplicated: Secondary | ICD-10-CM | POA: Insufficient documentation

## 2021-03-18 DIAGNOSIS — G4733 Obstructive sleep apnea (adult) (pediatric): Secondary | ICD-10-CM | POA: Insufficient documentation

## 2021-03-18 DIAGNOSIS — M79671 Pain in right foot: Secondary | ICD-10-CM | POA: Insufficient documentation

## 2021-03-18 DIAGNOSIS — Z79899 Other long term (current) drug therapy: Secondary | ICD-10-CM | POA: Insufficient documentation

## 2021-03-18 DIAGNOSIS — E876 Hypokalemia: Secondary | ICD-10-CM | POA: Insufficient documentation

## 2021-03-18 NOTE — Progress Notes (Signed)
Patient ID: Noah Rodriguez, male    DOB: 1975/07/21, 46 y.o.   MRN: [id number]  HPI  [name] is a 46 y/o male with a history of obstructive sleep apnea, obesity, hypokalemia, gout, asthma and chronic heart failure.   Echo done 08/25/16 reviewed and shows an EF of 60-65%. Echo done 02/14/16 and showed an EF of 35-40% along with mild MR/TR.    Last cardiac catheterization was done 09/17/14 and showed an EF of 20-25% at that time. No CAD present.   Has not been admitted or been in the ED in the last 6 months.    He presents today for a follow-up visit with a chief complaint of minimal shortness of breath with moderate exertion. He describes this as chronic in nature having been present for several years. He has associated head congestion, pedal edema & right foot pain due to gout. He denies any difficulty sleeping, dizziness, abdominal distention, palpitations, chest pain, cough and fatigue.   Continues to work third shift. Is back to drinking mostly water and only 1 pepsi a day.   Did get established with a PCP and says that he really likes her. She started him on metformin and he picked it up but hasn't started it yet. He wants to really work on his diet and activity and see how he does over these next couple of months.   He will no longer be able to get entresto through patient assistance because of his income and he says that he will not be able to afford to buy it.   Biggest complaint today is of a recurrence of gout in his right foot. Doesn't know of any dietary triggers although did eat some bacon the other day. Says that it's been ~ a year since his last "gout attack".   Past Medical History:  Diagnosis Date   Acute kidney injury ([facility])    Asthma    Chronic combined systolic and diastolic CHF (congestive heart failure) ([facility])    EF < 25% on Cath (09/17/2014)   Gout    Gout of big toe 09/16/2014   H/O medication noncompliance    Hypertension    Hypertensive heart disease     Hypokalemia    MI [facility]ocardial infarction) [facility])    Patient denies having had a MI   NICM (nonischemic cardiomyopathy) ([facility])    a. cardiac cath 09/2014 with normal coronary arteries, EF 20-25% with severe global HK; b. echo 08/2014: EF 30-35%, false tendon in LV aepx of no clinical sig, diffuse HK, GR1DD, aortic root 40 mm, trivial MR, trivial pericardial effusion posterior    Obesity, Class III, BMI 40-49.9 (morbid obesity) ([facility])    Sleep apnea    a. sleep study 05/2010; b. noncompliant with CPAP   Ventral hernia    a. incarcerated omentum 06/10/2015   Past Surgical History:  Procedure Laterality Date   CARDIAC CATHETERIZATION N/A 09/17/2014   Procedure: Right/Left Heart Cath and Coronary Angiography;  Surgeon: Lennette Bihari, MD;  Location: [facility];  Service: Cardiovascular;  Laterality: N/A;   VENTRAL HERNIA REPAIR N/A 06/11/2015   Procedure: HERNIA REPAIR VENTRAL ADULT;  Surgeon: Lattie Haw, MD;  Location: [facility];  Service: General;  Laterality: N/A;   Family History  Problem Relation Age of Onset   HIV Mother    Hypertension Mother    Hypertension Other    Hypertension Maternal Grandmother    Hypertension Paternal Grandmother    Alcohol abuse Neg Hx  Cancer Neg Hx    Early death Neg Hx    Heart disease Neg Hx    Hyperlipidemia Neg Hx    Kidney disease Neg Hx    Stroke Neg Hx    Heart attack Neg Hx    Social History   Tobacco Use   Smoking status: Never   Smokeless tobacco: Never  Substance Use Topics   Alcohol use: No   No Known Allergies  Prior to Admission medications   Medication Sig Start Date End Date Taking? Authorizing Provider  acetaminophen (TYLENOL) 650 MG CR tablet Take 650 mg by mouth every 8 (eight) hours as needed for pain.   Yes [provider]  albuterol (VENTOLIN HFA) 108 (90 Base) MCG/ACT inhaler Inhale 2 puffs into the lungs every 6 (six) hours as needed for wheezing or shortness of breath. 01/21/21  Yes Margarita Mail,  DO  amLODipine (NORVASC) 10 MG tablet Take 1 tablet by mouth once daily 01/29/21  Yes Keontay Vora, Inetta Fermo A, FNP  carvedilol (COREG) 25 MG tablet TAKE 1 TABLET BY MOUTH TWICE DAILY WITH MEALS 01/29/21  Yes Clarisa Kindred A, FNP  furosemide (LASIX) 40 MG tablet Take 1 tablet by mouth once daily 01/29/21  Yes Davonne Baby A, FNP  sacubitril-valsartan (ENTRESTO) 97-103 MG Take 1 tablet by mouth 2 (two) times daily. 06/22/17  Yes Delma Freeze, FNP  metFORMIN (GLUCOPHAGE) 500 MG tablet Take 1 tablet (500 mg total) by mouth daily with breakfast. Patient not taking: Reported on 03/18/2021 01/23/21   Margarita Mail, DO   Review of Systems  Constitutional:  Negative for appetite change and fatigue.  HENT:  Positive for congestion. Negative for postnasal drip and sore throat.   Eyes:  Negative for pain and visual disturbance.  Respiratory:  Positive for shortness of breath (minimal). Negative for cough, chest tightness and wheezing.   Cardiovascular:  Positive for leg swelling (minimal). Negative for chest pain and palpitations.  Gastrointestinal:  Negative for abdominal distention, abdominal pain and constipation.  Endocrine: Negative.   Genitourinary: Negative.   Musculoskeletal:  Positive for arthralgias (right foot (gout)). Negative for back pain and neck pain.  Skin: Negative.   Allergic/Immunologic: Negative.   Neurological:  Negative for dizziness, weakness, light-headedness, numbness and headaches.  Hematological:  Negative for adenopathy. Does not bruise/bleed easily.  Psychiatric/Behavioral:  Negative for dysphoric mood and sleep disturbance (works night shift). The patient is not nervous/anxious.    Vitals:   03/18/21 1610  BP: (!) 166/94  Pulse: 84  Resp: 18  SpO2: 99%  Weight: (!) 357 lb 3 oz (162 kg)  Height: 5\' 7"  (1.702 m)   Wt Readings from Last 3 Encounters:  03/18/21 (!) 357 lb 3 oz (162 kg)  01/21/21 (!) 367 lb 4.8 oz (166.6 kg)  12/17/20 (!) 366 lb 6 oz (166.2 kg)   Lab  Results  Component Value Date   CREATININE 1.22 12/17/2020   CREATININE 1.30 (H) 02/15/2019   CREATININE 1.39 (H) 08/05/2017    Physical Exam Vitals and nursing note reviewed.  Constitutional:      Appearance: He is well-developed.  HENT:     Head: Normocephalic and atraumatic.  Neck:     Vascular: No JVD.  Cardiovascular:     Rate and Rhythm: Normal rate and regular rhythm.  Pulmonary:     Effort: Pulmonary effort is normal.     Breath sounds: No wheezing or rales.  Abdominal:     General: There is no distension.  Palpations: Abdomen is soft.     Tenderness: There is no abdominal tenderness.  Musculoskeletal:        General: Tenderness (right foot) present.     Cervical back: Normal range of motion and neck supple.     Right lower leg: Edema (trace pitting) present.     Left lower leg: Edema (trace pitting) present.  Skin:    General: Skin is warm and dry.  Neurological:     General: No focal deficit present.     Mental Status: He is alert and oriented to person, place, and time.  Psychiatric:        Behavior: Behavior normal.        Thought Content: Thought content normal.    Assessment & Plan:  1: Chronic heart failure with preserved ejection fraction- - NYHA class II - euvolemic - weighing daily; reminded to call for an overnight weight gain of >2 pounds or a weekly weight gain of >5 pounds - weight down 9 pounds from last visit here 3 months ago; congratulated on this  - now drinking mostly water with 1 pepsi daily; was previously drinking mostly soda/tea - not adding any salt to his food - has become more active at work but knows that he needs to get more consistent exercise in - discussed that we really need to update his echo (last one done 2018) but he doesn't want to schedule it right now as he still remains uninsured - since he won't be able to afford entresto, he will finish his current entresto dose and when he has ~ 1 week left he's to call us and  will change him to valsartan - pro-BNP on 11/04/19 was 82   2: HTN- - BP elevated (166/94); at PCP office 2 months ago it was normal at 118/72 - reports significant pain today in his foot due to gout - saw PCP Caralee Ates) 01/21/21 - BMP 12/17/20 reviewed and showed Na 135, K 3.7, GFR >60 and Scr 1.22  3: Sleep apnea- - sleep study referral placed by PCP 01/21/21 - sees pulmonology 03/31/21  4: Acute gout of right foot- - emphasized to call his PCP regarding this - may benefit from daily allopurinol   Medication bottles were reviewed.   Return here in 2 months, sooner if needed

## 2021-03-18 NOTE — Patient Instructions (Addendum)
Continue weighing daily and call for an overnight weight gain of 3 pounds or more or a weekly weight gain of more than 5 pounds. ? ? ?If you have voicemail, please make sure your mailbox is cleaned out so that we may leave a message and please make sure to listen to any voicemails.  ? ? ?Finish your [employer]. When you have about a week left of the medication, call me and I'll send in a different medication to replace it.  ? ?

## 2021-03-19 ENCOUNTER — Encounter: Payer: Self-pay | Admitting: Family

## 2021-03-20 ENCOUNTER — Ambulatory Visit: Payer: Self-pay

## 2021-03-20 ENCOUNTER — Telehealth (INDEPENDENT_AMBULATORY_CARE_PROVIDER_SITE_OTHER): Payer: Self-pay | Admitting: Internal Medicine

## 2021-03-20 ENCOUNTER — Encounter: Payer: Self-pay | Admitting: Internal Medicine

## 2021-03-20 DIAGNOSIS — M109 Gout, unspecified: Secondary | ICD-10-CM

## 2021-03-20 MED ORDER — NAPROXEN 500 MG PO TABS: 500.0000 mg | ORAL_TABLET | Freq: Two times a day (BID) | ORAL | 0 refills | Status: AC

## 2021-03-20 NOTE — Telephone Encounter (Signed)
Pt has a virtual appt today ?

## 2021-03-20 NOTE — Progress Notes (Signed)
Virtual Visit via Video Note ? ?I connected with Noah Rodriguez on 03/20/21 at 11:20 AM EST by a video enabled telemedicine application and verified that I am speaking with the correct person using two identifiers. ? ?Location: ?Patient: Home ?Provider: [facility] ?  ?I discussed the limitations of evaluation and management by telemedicine and the availability of in person appointments. The patient expressed understanding and agreed to proceed. ? ?History of Present Illness: ? ?Noah Rodriguez is a 46 year old male presenting over telemedicine for possible gout.  Last flare 1 year ago.  ? ?GOUT ?Duration:1 week  ?Right 1st metatarsophalangeal pain: yes ?Severity: 10/10  ?Quality: aching ?Swelling: yes ?Redness: yes ?Trauma: no ?Recent dietary change or indiscretion: yes - ate at [facility] last week ?Fevers: no ?Nausea/vomiting: no ?Aggravating factors: walking  ?Alleviating factors: rest, Motrin  ?Status:  worse ?Treatments attempted: took some Motrin which did help slightly  ? ?Observations/Objective: ? ?General: well appearing, no acute distress ?ENT: conjunctiva normal appearing bilaterally  ?Ext: swelling present at right foot and ankle ?Skin: no rashes, cyanosis or abnormal bruising noted - mild erythema noted over right first MTP ?Neuro: answers all questions appropriately  ? ?Assessment and Plan: ? ?1. Acute gout of right foot, unspecified cause: Erythema, swelling of right foot noted over telemedicine, treat with Naproxen BID x 7-10 days for symptoms. Should symptoms persist will add Colchicine. Discussed dietary changes and foods that can trigger gout. Last flare 1 year ago, can discuss starting Allopurinol in the future.  ? ?- naproxen (NAPROSYN) 500 MG tablet; Take 1 tablet (500 mg total) by mouth 2 (two) times daily with a meal for 10 days.  Dispense: 20 tablet; Refill: 0 ? ? ?Follow Up Instructions: PRN ? ?  ?I discussed the assessment and treatment plan with the patient. The patient was provided an  opportunity to ask questions and all were answered. The patient agreed with the plan and demonstrated an understanding of the instructions. ?  ?The patient was advised to call back or seek an in-person evaluation if the symptoms worsen or if the condition fails to improve as anticipated. ? ?I provided 12 minutes of non-face-to-face time during this encounter. ? ? ?Teodora Medici, DO ? ?

## 2021-03-20 NOTE — Patient Instructions (Addendum)
It was great seeing you today! ? ?Plan discussed at today's visit: ?-Take Naproxen 500 mg twice a day with food while gout symptoms are present. Do not take any other anti-inflammatories like Advil, Aleve, Motrin, Ibuprofen, etc. Can take Tylenol.  ?-Can discuss a preventive medication after flare resolves ? ?Follow up in: as needed ? ?Take care and let us know if you have any questions or concerns prior to your next visit. ? ?Dr. Caralee Ates ? ?Gout ?Gout is a condition that causes painful swelling of the joints. Gout is a type of inflammation of the joints (arthritis). This condition is caused by having too much uric acid in the body. Uric acid is a chemical that forms when the body breaks down substances called purines. Purines are important for building body proteins. ?When the body has too much uric acid, sharp crystals can form and build up inside the joints. This causes pain and swelling. Gout attacks can happen quickly and may be very painful (acute gout). Over time, the attacks can affect more joints and become more frequent (chronic gout). Gout can also cause uric acid to build up under the skin and inside the kidneys. ?What are the causes? ?This condition is caused by too much uric acid in your blood. This can happen because: ?Your kidneys do not remove enough uric acid from your blood. This is the most common cause. ?Your body makes too much uric acid. This can happen with some cancers and cancer treatments. It can also occur if your body is breaking down too many red blood cells (hemolytic anemia). ?You eat too many foods that are high in purines. These foods include organ meats and some seafood. Alcohol, especially beer, is also high in purines. ?A gout attack may be triggered by trauma or stress. ?What increases the risk? ?You are more likely to develop this condition if you: ?Have a family history of gout. ?Are male and middle-aged. ?Are male and have gone through menopause. ?Are obese. ?Frequently  drink alcohol, especially beer. ?Are dehydrated. ?Lose weight too quickly. ?Have an organ transplant. ?Have lead poisoning. ?Take certain medicines, including aspirin, cyclosporine, diuretics, levodopa, and niacin. ?Have kidney disease. ?Have a skin condition called psoriasis. ?What are the signs or symptoms? ?An attack of acute gout happens quickly. It usually occurs in just one joint. The most common place is the big toe. Attacks often start at night. Other joints that may be affected include joints of the feet, ankle, knee, fingers, wrist, or elbow. Symptoms of this condition may include: ?Severe pain. ?Warmth. ?Swelling. ?Stiffness. ?Tenderness. The affected joint may be very painful to touch. ?Shiny, red, or purple skin. ?Chills and fever. ?Chronic gout may cause symptoms more frequently. More joints may be involved. You may also have white or yellow lumps (tophi) on your hands or feet or in other areas near your joints. ?How is this diagnosed? ?This condition is diagnosed based on your symptoms, medical history, and physical exam. You may have tests, such as: ?Blood tests to measure uric acid levels. ?Removal of joint fluid with a thin needle (aspiration) to look for uric acid crystals. ?X-rays to look for joint damage. ?How is this treated? ?Treatment for this condition has two phases: treating an acute attack and preventing future attacks. Acute gout treatment may include medicines to reduce pain and swelling, including: ?NSAIDs. ?Steroids. These are strong anti-inflammatory medicines that can be taken by mouth (orally) or injected into a joint. ?Colchicine. This medicine relieves pain and swelling when it  is taken soon after an attack. It can be given by mouth or through an IV. ?Preventive treatment may include: ?Daily use of smaller doses of NSAIDs or colchicine. ?Use of a medicine that reduces uric acid levels in your blood. ?Changes to your diet. You may need to see a dietitian about what to eat and  drink to prevent gout. ?Follow these instructions at home: ?During a gout attack ? ?If directed, put ice on the affected area: ?Put ice in a plastic bag. ?Place a towel between your skin and the bag. ?Leave the ice on for 20 minutes, 2-3 times a day. ?Raise (elevate) the affected joint above the level of your heart as often as possible. ?Rest the joint as much as possible. If the affected joint is in your leg, you may be given crutches to use. ?Follow instructions from your health care provider about eating or drinking restrictions. ?Avoiding future gout attacks ?Follow a low-purine diet as told by your dietitian or health care provider. Avoid foods and drinks that are high in purines, including liver, kidney, anchovies, asparagus, herring, mushrooms, mussels, and beer. ?Maintain a healthy weight or lose weight if you are overweight. If you want to lose weight, talk with your health care provider. It is important that you do not lose weight too quickly. ?Start or maintain an exercise program as told by your health care provider. ?Eating and drinking ?Drink enough fluids to keep your urine pale yellow. ?If you drink alcohol: ?Limit how much you use to: ?0-1 drink a day for women. ?0-2 drinks a day for men. ?Be aware of how much alcohol is in your drink. In the [place] one drink equals one 12 oz bottle of beer (355 mL) one 5 oz glass of wine (148 mL), or one 1? oz glass of hard liquor (44 mL). ?General instructions ?Take over-the-counter and prescription medicines only as told by your health care provider. ?Do not drive or use heavy machinery while taking prescription pain medicine. ?Return to your normal activities as told by your health care provider. Ask your health care provider what activities are safe for you. ?Keep all follow-up visits as told by your health care provider. This is important. ?Contact a health care provider if you have: ?Another gout attack. ?Continuing symptoms of a gout attack after 10 days of  treatment. ?Side effects from your medicines. ?Chills or a fever. ?Burning pain when you urinate. ?Pain in your lower back or belly. ?Get help right away if you: ?Have severe or uncontrolled pain. ?Cannot urinate. ?Summary ?Gout is painful swelling of the joints caused by inflammation. ?The most common site of pain is the big toe, but it can affect other joints in the body. ?Medicines and dietary changes can help to prevent and treat gout attacks. ?This information is not intended to replace advice given to you by your health care provider. Make sure you discuss any questions you have with your health care provider. ?Document Revised: 07/08/2017 Document Reviewed: 07/20/2017 ?Elsevier Patient Education ? 2022 [employer] Inc[employer] ?

## 2021-03-20 NOTE — Telephone Encounter (Signed)
Summary: gout RX  ? Pt called to see if he can get an RX for gout flair up in right foot/ please advise  ?  ?           Left message to call back about symptoms. ?

## 2021-03-20 NOTE — Telephone Encounter (Signed)
? ? ? ?  Chief Complaint: Pain, swelling right foot. Has had gout in the past. ?Symptoms: Above ?Frequency: Started 1 week ago. ?Pertinent Negatives: Patient denies  ?Disposition: [] ED /[] Urgent Care (no appt availability in office) / [] Appointment(In office/virtual)/ []  [facility]/ [] Home Care/ [] Refused Recommended Disposition /[] [facility]/ []  Follow-up with PCP ?Additional Notes: Pt. Asking for virtual appointment. No availability until Monday. Asking to be worked in today. Will see any provider. Please advise.  ?Answer Assessment - Initial Assessment Questions ?1. ONSET: "When did the pain start?"  ?    1 week ago ?2. LOCATION: "Where is the pain located?"  ?    Right foot ?3. PAIN: "How bad is the pain?"    (Scale 1-10; or mild, moderate, severe) ? - MILD (1-3): doesn't interfere with normal activities.  ? - MODERATE (4-7): interferes with normal activities (e.g., work or school) or awakens from sleep, limping.  ? - SEVERE (8-10): excruciating pain, unable to do any normal activities, unable to walk.  ?    9 ?4. WORK OR EXERCISE: "Has there been any recent work or exercise that involved this part of the body?"  ?    No ?5. CAUSE: "What do you think is causing the foot pain?" ?    Gout ?6. OTHER SYMPTOMS: "Do you have any other symptoms?" (e.g., leg pain, rash, fever, numbness) ?    No ?7. PREGNANCY: "Is there any chance you are pregnant?" "When was your last menstrual period?" ?    N/A ? ?Protocols used: Foot Pain-A-AH ? ?

## 2021-03-27 ENCOUNTER — Other Ambulatory Visit: Payer: Self-pay | Admitting: Family

## 2021-03-27 MED ORDER — AMLODIPINE BESYLATE 10 MG PO TABS: 10.0000 mg | ORAL_TABLET | Freq: Every day | ORAL | 3 refills | Status: DC

## 2021-03-27 NOTE — Progress Notes (Signed)
Refilled amlodipine 

## 2021-03-31 ENCOUNTER — Institutional Professional Consult (permissible substitution): Payer: Self-pay | Admitting: Primary Care

## 2021-04-08 ENCOUNTER — Encounter: Payer: Self-pay | Admitting: Primary Care

## 2021-04-08 NOTE — Progress Notes (Signed)
 This encounter was created in error - please disregard.

## 2021-04-08 NOTE — Patient Instructions (Signed)
?  Sleep apnea is defined as period of 10 seconds or longer when you stop breathing at night. This can happen multiple times a night. Dx sleep apnea is when this occurs more than 5 times an hour.  ?  ?Mild OSA 5-15 apneic events an hour ?Moderate OSA 15-30 apneic events an hour ?Severe OSA > 30 apneic events an hour ?  ?Untreated sleep apnea puts you at higher risk for cardiac arrhythmias, pulmonary HTN, stroke and diabetes ?  ?Treatment options include weight loss, side sleeping position, oral appliance, CPAP therapy or referral to ENT for possible surgical options  ?  ?Recommendations: ?Focus on side sleeping position ?Work on weight loss efforts  ?Do not drive if experiencing excessive daytime sleepiness of fatigue  ?  ?Orders: ?Home sleep study re: snoring  ?  ?Follow-up: ?4-6 week visit to review sleep study results and discuss treatment options further ? ?

## 2021-04-30 ENCOUNTER — Ambulatory Visit: Payer: Self-pay | Admitting: Internal Medicine

## 2021-05-06 ENCOUNTER — Ambulatory Visit: Payer: Self-pay | Admitting: Internal Medicine

## 2021-05-19 NOTE — Progress Notes (Deleted)
Patient ID: Noah Rodriguez, male    DOB: 08/09/1975, 46 y.o.   MRN: [id number]  HPI  Mr Woodcox is a 46 y/o male with a history of obstructive sleep apnea, obesity, hypokalemia, gout, asthma and chronic heart failure.   Echo done 08/25/16 reviewed and shows an EF of 60-65%. Echo done 02/14/16 and showed an EF of 35-40% along with mild MR/TR.    Last cardiac catheterization was done 09/17/14 and showed an EF of 20-25% at that time. No CAD present.   Has not been admitted or been in the ED in the last 6 months.    He presents today for a follow-up visit with a chief complaint of   Past Medical History:  Diagnosis Date   Acute kidney injury (HCC)    Asthma    Chronic combined systolic and diastolic CHF (congestive heart failure) (HCC)    EF < 25% on Cath (09/17/2014)   Gout    Gout of big toe 09/16/2014   H/O medication noncompliance    Hypertension    Hypertensive heart disease    Hypokalemia    MI (myocardial infarction) [facility])    Patient denies having had a MI   NICM (nonischemic cardiomyopathy) (HCC)    a. cardiac cath 09/2014 with normal coronary arteries, EF 20-25% with severe global HK; b. echo 08/2014: EF 30-35%, false tendon in LV aepx of no clinical sig, diffuse HK, GR1DD, aortic root 40 mm, trivial MR, trivial pericardial effusion posterior    Obesity, Class III, BMI 40-49.9 (morbid obesity) (HCC)    Sleep apnea    a. sleep study 05/2010; b. noncompliant with CPAP   Ventral hernia    a. incarcerated omentum 06/10/2015   Past Surgical History:  Procedure Laterality Date   CARDIAC CATHETERIZATION N/A 09/17/2014   Procedure: Right/Left Heart Cath and Coronary Angiography;  Surgeon: Lennette Bihari, MD;  Location: [facility] INVASIVE CV LAB;  Service: Cardiovascular;  Laterality: N/A;   VENTRAL HERNIA REPAIR N/A 06/11/2015   Procedure: HERNIA REPAIR VENTRAL ADULT;  Surgeon: Lattie Haw, MD;  Location: [facility] ORS;  Service: General;  Laterality: N/A;   Family History  Problem Relation  Age of Onset   HIV Mother    Hypertension Mother    Hypertension Other    Hypertension Maternal Grandmother    Hypertension Paternal Grandmother    Alcohol abuse Neg Hx    Cancer Neg Hx    Early death Neg Hx    Heart disease Neg Hx    Hyperlipidemia Neg Hx    Kidney disease Neg Hx    Stroke Neg Hx    Heart attack Neg Hx    Social History   Tobacco Use   Smoking status: Never   Smokeless tobacco: Never  Substance Use Topics   Alcohol use: No   No Known Allergies   Review of Systems  Constitutional:  Negative for appetite change and fatigue.  HENT:  Positive for congestion. Negative for postnasal drip and sore throat.   Eyes:  Negative for pain and visual disturbance.  Respiratory:  Positive for shortness of breath (minimal). Negative for cough, chest tightness and wheezing.   Cardiovascular:  Positive for leg swelling (minimal). Negative for chest pain and palpitations.  Gastrointestinal:  Negative for abdominal distention, abdominal pain and constipation.  Endocrine: Negative.   Genitourinary: Negative.   Musculoskeletal:  Positive for arthralgias (right foot (gout)). Negative for back pain and neck pain.  Skin: Negative.   Allergic/Immunologic: Negative.  Neurological:  Negative for dizziness, weakness, light-headedness, numbness and headaches.  Hematological:  Negative for adenopathy. Does not bruise/bleed easily.  Psychiatric/Behavioral:  Negative for dysphoric mood and sleep disturbance (works night shift). The patient is not nervous/anxious.      Physical Exam Vitals and nursing note reviewed.  Constitutional:      Appearance: He is well-developed.  HENT:     Head: Normocephalic and atraumatic.  Neck:     Vascular: No JVD.  Cardiovascular:     Rate and Rhythm: Normal rate and regular rhythm.  Pulmonary:     Effort: Pulmonary effort is normal.     Breath sounds: No wheezing or rales.  Abdominal:     General: There is no distension.     Palpations:  Abdomen is soft.     Tenderness: There is no abdominal tenderness.  Musculoskeletal:        General: Tenderness (right foot) present.     Cervical back: Normal range of motion and neck supple.     Right lower leg: Edema (trace pitting) present.     Left lower leg: Edema (trace pitting) present.  Skin:    General: Skin is warm and dry.  Neurological:     General: No focal deficit present.     Mental Status: He is alert and oriented to person, place, and time.  Psychiatric:        Behavior: Behavior normal.        Thought Content: Thought content normal.    Assessment & Plan:  1: Chronic heart failure with preserved ejection fraction- - NYHA class II - euvolemic - weighing daily; reminded to call for an overnight weight gain of >2 pounds or a weekly weight gain of >5 pounds - weight 357.3 pounds from last visit here 2 months ago - now drinking mostly water with 1 pepsi daily; was previously drinking mostly soda/tea - not adding any salt to his food - has become more active at work but knows that he needs to get more consistent exercise in - discussed that we really need to update his echo (last one done 2018) but he doesn't want to schedule it right now as he still remains uninsured - since he won't be able to afford entresto, he will finish his current entresto dose and when he has ~ 1 week left he's to call us and will change him to lsoartan - pro-BNP on 11/04/19 was 82   2: HTN- - BP  - had video visit with PCP Caralee Ates) 03/20/21 - BMP 12/17/20 reviewed and showed Na 135, K 3.7, GFR >60 and Scr 1.22  3: Sleep apnea- - sleep study referral placed by PCP 01/21/21 - NS for pulmonology 03/31/21  4: Gout-

## 2021-05-20 ENCOUNTER — Ambulatory Visit: Payer: Self-pay | Admitting: Family

## 2021-05-27 ENCOUNTER — Ambulatory Visit: Payer: Self-pay | Admitting: Internal Medicine

## 2021-05-29 ENCOUNTER — Telehealth: Payer: Self-pay | Admitting: Family

## 2021-05-29 MED ORDER — LOSARTAN POTASSIUM 100 MG PO TABS: 100.0000 mg | ORAL_TABLET | Freq: Every day | ORAL | 3 refills | Status: DC

## 2021-05-29 NOTE — Telephone Encounter (Signed)
Patient called and LM saying that he was getting ready to run out of his entresto and would need something else called in. He is no longer approved to get it through patient assistance and can not afford to pay for entresto out right.   Will have him finish out his entresto and then begin losartan 100mg  once daily. RX sent to on [facility]

## 2021-06-17 ENCOUNTER — Encounter: Payer: Self-pay | Admitting: Family

## 2021-06-17 ENCOUNTER — Ambulatory Visit: Payer: Self-pay | Attending: Family | Admitting: Family

## 2021-06-17 VITALS — BP 158/97 | HR 86 | Resp 18 | Ht 66.0 in | Wt 374.2 lb

## 2021-06-17 DIAGNOSIS — J45909 Unspecified asthma, uncomplicated: Secondary | ICD-10-CM | POA: Insufficient documentation

## 2021-06-17 DIAGNOSIS — Z6841 Body Mass Index (BMI) 40.0 and over, adult: Secondary | ICD-10-CM | POA: Insufficient documentation

## 2021-06-17 DIAGNOSIS — Z794 Long term (current) use of insulin: Secondary | ICD-10-CM

## 2021-06-17 DIAGNOSIS — Z713 Dietary counseling and surveillance: Secondary | ICD-10-CM | POA: Insufficient documentation

## 2021-06-17 DIAGNOSIS — E119 Type 2 diabetes mellitus without complications: Secondary | ICD-10-CM | POA: Insufficient documentation

## 2021-06-17 DIAGNOSIS — M109 Gout, unspecified: Secondary | ICD-10-CM | POA: Insufficient documentation

## 2021-06-17 DIAGNOSIS — Z79899 Other long term (current) drug therapy: Secondary | ICD-10-CM | POA: Insufficient documentation

## 2021-06-17 DIAGNOSIS — I110 Hypertensive heart disease with heart failure: Secondary | ICD-10-CM | POA: Insufficient documentation

## 2021-06-17 DIAGNOSIS — I5032 Chronic diastolic (congestive) heart failure: Secondary | ICD-10-CM | POA: Insufficient documentation

## 2021-06-17 DIAGNOSIS — G4733 Obstructive sleep apnea (adult) (pediatric): Secondary | ICD-10-CM | POA: Insufficient documentation

## 2021-06-17 DIAGNOSIS — I10 Essential (primary) hypertension: Secondary | ICD-10-CM

## 2021-06-17 DIAGNOSIS — E876 Hypokalemia: Secondary | ICD-10-CM | POA: Insufficient documentation

## 2021-06-17 LAB — BASIC METABOLIC PANEL
Anion gap: 7 (ref 5–15)
BUN: 25 mg/dL — ABNORMAL HIGH (ref 6–20)
CO2: 25 mmol/L (ref 22–32)
Calcium: 8.9 mg/dL (ref 8.9–10.3)
Chloride: 106 mmol/L (ref 98–111)
Creatinine, Ser: 1.25 mg/dL — ABNORMAL HIGH (ref 0.61–1.24)
GFR, Estimated: >60 mL/min (ref >60)
Glucose, Bld: 109 mg/dL — ABNORMAL HIGH (ref 70–99)
Potassium: 3.8 mmol/L (ref 3.5–5.1)
Sodium: 138 mmol/L (ref 135–145)

## 2021-06-17 LAB — HEMOGLOBIN A1C
Hgb A1c MFr Bld: 6.2 % — ABNORMAL HIGH (ref 4.8–5.6)
Mean Plasma Glucose: 131.24 mg/dL

## 2021-06-17 NOTE — Progress Notes (Signed)
Patient ID: Noah Rodriguez, male    DOB: 04/20/1975, 46 y.o.   MRN: [id number]  HPI  Noah Rodriguez is a 46 y/o male with a history of obstructive sleep apnea, obesity, hypokalemia, gout, asthma and chronic heart failure.   Echo done 08/25/16 reviewed and shows an EF of 60-65%. Echo done 02/14/16 and showed an EF of 35-40% along with mild Noah/TR.    Last cardiac catheterization was done 09/17/14 and showed an EF of 20-25% at that time. No CAD present.   Has not been admitted or been in the ED in the last 6 months.    He presents today for a follow-up visit with a chief complaint of minimal fatigue upon moderate exertion. Describes this as chronic in nature. He has associated head congestion, pedal edema, palpitations and gradual weight gain along with this. He denies any difficulty sleeping, dizziness, abdominal distention, chest pain, shortness of breath or cough.   Has been wearing compression socks intermittently. Says that on his day off, he has no swelling in his legs.   Admits that he's been drinking slushies from a new [employer] that is close to him. 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 Will fix eggs and bacon, sausage or country ham and then a big lunch.   Says that he's recently gotten insurance but does not have a card or the information for it yet. Has not started the metformin yet but does have it. Was going to f/u with PCP about starting it but he hasn't been back yet.    Past Medical History:  Diagnosis Date   Acute kidney injury (HCC)    Asthma    Chronic combined systolic and diastolic CHF (congestive heart failure) (HCC)    EF < 25% on Cath (09/17/2014)   Gout    Gout of big toe 09/16/2014   H/O medication noncompliance    Hypertension    Hypertensive heart disease    Hypokalemia    MI (myocardial infarction) [facility])    Patient denies having had a MI   NICM (nonischemic cardiomyopathy) (HCC)    a. cardiac cath 09/2014 with normal coronary arteries, EF 20-25% with  severe global HK; b. echo 08/2014: EF 30-35%, false tendon in LV aepx of no clinical sig, diffuse HK, GR1DD, aortic root 40 mm, trivial Noah, trivial pericardial effusion posterior    Obesity, Class III, BMI 40-49.9 (morbid obesity) (HCC)    Sleep apnea    a. sleep study 05/2010; b. noncompliant with CPAP   Ventral hernia    a. incarcerated omentum 06/10/2015   Past Surgical History:  Procedure Laterality Date   CARDIAC CATHETERIZATION N/A 09/17/2014   Procedure: Right/Left Heart Cath and Coronary Angiography;  Surgeon: Lennette Bihari, MD;  Location: [facility];  Service: Cardiovascular;  Laterality: N/A;   VENTRAL HERNIA REPAIR N/A 06/11/2015   Procedure: HERNIA REPAIR VENTRAL ADULT;  Surgeon: Lattie Haw, MD;  Location: [facility] ORS;  Service: General;  Laterality: N/A;   Family History  Problem Relation Age of Onset   HIV Mother    Hypertension Mother    Hypertension Other    Hypertension Maternal Grandmother    Hypertension Paternal Grandmother    Alcohol abuse Neg Hx    Cancer Neg Hx    Early death Neg Hx    Heart disease Neg Hx    Hyperlipidemia Neg Hx    Kidney disease Neg Hx    Stroke Neg Hx  Heart attack Neg Hx    Social History   Tobacco Use   Smoking status: Never   Smokeless tobacco: Never  Substance Use Topics   Alcohol use: No   No Known Allergies  Prior to Admission medications   Medication Sig Start Date End Date Taking? Authorizing Provider  acetaminophen (TYLENOL) 650 MG CR tablet Take 650 mg by mouth every 8 (eight) hours as needed for pain.   Yes [provider]  albuterol (VENTOLIN HFA) 108 (90 Base) MCG/ACT inhaler Inhale 2 puffs into the lungs every 6 (six) hours as needed for wheezing or shortness of breath. 01/21/21  Yes Margarita Mail, DO  amLODipine (NORVASC) 10 MG tablet Take 1 tablet (10 mg total) by mouth daily. 03/27/21  Yes Clarisa Kindred A, FNP  carvedilol (COREG) 25 MG tablet TAKE 1 TABLET BY MOUTH TWICE DAILY WITH MEALS  01/29/21  Yes Clarisa Kindred A, FNP  furosemide (LASIX) 40 MG tablet Take 1 tablet by mouth once daily 01/29/21  Yes Daisuke Bailey A, FNP  losartan (COZAAR) 100 MG tablet Take 1 tablet (100 mg total) by mouth daily. 05/29/21  Yes Delma Freeze, FNP  metFORMIN (GLUCOPHAGE) 500 MG tablet Take 1 tablet (500 mg total) by mouth daily with breakfast. Patient not taking: Reported on 06/17/2021 01/23/21   Margarita Mail, DO   Review of Systems  Constitutional:  Positive for fatigue. Negative for appetite change.  HENT:  Positive for congestion. Negative for postnasal drip and sore throat.   Eyes:  Negative for pain and visual disturbance.  Respiratory:  Negative for cough, chest tightness, shortness of breath and wheezing.   Cardiovascular:  Positive for palpitations and leg swelling (minimal). Negative for chest pain.  Gastrointestinal:  Negative for abdominal distention and constipation.  Endocrine: Negative.   Genitourinary: Negative.   Musculoskeletal:  Negative for arthralgias, back pain and neck pain.  Skin: Negative.   Allergic/Immunologic: Negative.   Neurological:  Negative for dizziness, weakness, light-headedness, numbness and headaches.  Hematological:  Negative for adenopathy. Does not bruise/bleed easily.  Psychiatric/Behavioral:  Negative for dysphoric mood and sleep disturbance (works night shift). The patient is not nervous/anxious.    Vitals:   06/17/21 0811  BP: (!) 158/97  Pulse: 86  Resp: 18  SpO2: 98%  Weight: (!) 374 lb 4 oz (169.8 kg)  Height: 5\' 6"  (1.676 m)   Wt Readings from Last 3 Encounters:  06/17/21 (!) 374 lb 4 oz (169.8 kg)  03/18/21 (!) 357 lb 3 oz (162 kg)  01/21/21 (!) 367 lb 4.8 oz (166.6 kg)   Lab Results  Component Value Date   CREATININE 1.22 12/17/2020   CREATININE 1.30 (H) 02/15/2019   CREATININE 1.39 (H) 08/05/2017   Physical Exam Vitals and nursing note reviewed.  Constitutional:      Appearance: He is well-developed.  HENT:     Head:  Normocephalic and atraumatic.  Neck:     Vascular: No JVD.  Cardiovascular:     Rate and Rhythm: Normal rate and regular rhythm.  Pulmonary:     Effort: Pulmonary effort is normal.     Breath sounds: No wheezing or rales.  Abdominal:     General: There is no distension.     Palpations: Abdomen is soft.     Tenderness: There is no abdominal tenderness.  Musculoskeletal:        General: No tenderness.     Cervical back: Normal range of motion and neck supple.     Right lower  leg: Edema (trace pitting) present.     Left lower leg: Edema (trace pitting) present.  Skin:    General: Skin is warm and dry.  Neurological:     General: No focal deficit present.     Mental Status: He is alert and oriented to person, place, and time.  Psychiatric:        Behavior: Behavior normal.        Thought Content: Thought content normal.    Assessment & Plan:  1: Chronic heart failure with preserved ejection fraction- - NYHA class II - euvolemic - weighing daily; reminded to call for an overnight weight gain of >2 pounds or a weekly weight gain of >5 pounds - weight up 17 pounds from last visit here 2 months ago - has gotten back in the habit of drinking sodas and says that a [employer] was built close to him so he's been drinking their slushies; reviewed the high sugar content and how that leads to weight - not adding any salt to his food - has become more active at work but knows that he needs to get more consistent exercise in - discussed, again, that we really need to update his echo; will schedule this for a month out so he can get his insurance information - explained that medications may need to be changed based on results - now on losartan and tolerating it well; was unable to afford entresto and did not qualify for assistance - pro-BNP on 11/04/19 was 82  - PharmD reconciled medications with the patient  2: HTN- - BP mildly elevated (158/97) - had video visit with PCP Caralee Ates) 03/20/21 -  BMP 12/17/20 reviewed and showed Na 135, K 3.7, GFR >60 and Scr 1.22 - will check BMP today  3: Sleep apnea- - sleep study referral placed by PCP 01/21/21 - NS for pulmonology 03/31/21  4: DM- - was supposed to have started metformin and he picked it up but hadn't started it yet - will check A1c today and if elevated, he will start it   Medication bottles reviewed.   Return in 6 weeks, sooner if needed.

## 2021-06-17 NOTE — Patient Instructions (Signed)
Continue weighing daily and call for an overnight weight gain of 3 pounds or more or a weekly weight gain of more than 5 pounds  If you have voicemail, please make sure your mailbox is cleaned out so that we may leave a message and please make sure to listen to any voicemails.    If you receive a satisfaction survey regarding the [facility], please take the time to fill it out. This way we can continue to provide excellent care and make any changes that need to be made.     

## 2021-06-17 NOTE — Progress Notes (Signed)
[place] - PHARMACIST COUNSELING NOTE  Guideline-Directed Medical Therapy/Evidence Based Medicine  ACE/ARB/ARNI: Losartan 100 mg daily Beta Blocker: Carvedilol 25 mg twice daily Aldosterone Antagonist: None Diuretic: Furosemide 40 mg daily SGLT2i: None  Adherence Assessment  Do you ever forget to take your medication? [] Yes [x] No  Do you ever skip doses due to side effects? [] Yes [x] No  Do you have trouble affording your medicines? [] Yes [x] No  Are you ever unable to pick up your medication due to transportation difficulties? [] Yes [x] No  Do you ever stop taking your medications because you don't believe they are helping? [] Yes [x] No  Do you check your weight daily? [] Yes [x] No   Adherence strategy: associates medications with time of day  Barriers to obtaining medications: none  Vital signs: HR 86, BP 158/97, weight (pounds) 374 lb ECHO: Date 08/14/2016, EF 60-65%, notes: normal systolic function; wall motion normal; no regional wall motion abnormalities; normal left ventricular function; normal study Cath: Date 09/17/14, EF 20-25%, notes: left ventricular dilatation with severe global hypo-contractility     Latest Ref Rng & Units 12/17/2020   10:23 AM 02/15/2019    9:19 AM 08/05/2017    8:58 AM  BMP  Glucose 70 - 99 mg/dL 121   125   140    BUN 6 - 20 mg/dL 18   19   19     Creatinine 0.61 - 1.24 mg/dL 1.22   1.30   1.39    Sodium 135 - 145 mmol/L 135   139   142    Potassium 3.5 - 5.1 mmol/L 3.7   4.0   3.5    Chloride 98 - 111 mmol/L 101   102   103    CO2 22 - 32 mmol/L 27   28   32    Calcium 8.9 - 10.3 mg/dL 8.7   8.9   8.5      Past Medical History:  Diagnosis Date   Acute kidney injury ([place])    Asthma    Chronic combined systolic and diastolic CHF (congestive heart failure) (HCC)    EF < 25% on Cath (09/17/2014)   Gout    Gout of big toe 09/16/2014   H/O medication noncompliance    Hypertension    Hypertensive heart  disease    Hypokalemia    MI (myocardial infarction) [facility])    Patient denies having had a MI   NICM (nonischemic cardiomyopathy) ([place])    a. cardiac cath 09/2014 with normal coronary arteries, EF 20-25% with severe global HK; b. echo 08/2014: EF 30-35%, false tendon in LV aepx of no clinical sig, diffuse HK, GR1DD, aortic root 40 mm, trivial MR, trivial pericardial effusion posterior    Obesity, Class III, BMI 40-49.9 (morbid obesity) (HCC)    Sleep apnea    a. sleep study 05/2010; b. noncompliant with CPAP   Ventral hernia    a. incarcerated omentum 06/10/2015    ASSESSMENT 46 year old male who presents to the HF clinic for follow up. PMH relevant to sleep apnea, obesity, gout, asthma, CHF. In 2016, cath showed EF of 20-25%, then in 2018 ECHO with EF 60-65% and essentially normal study. No other ECHO's since this date due to cost and uninsured status. Patient works nightshift, and takes medications when arrives home and evening medications before leaving for work. Patient is not checking blood pressure or weight. Has a blood pressure cuff at home and a scale but patient is weary  to check these values. Has noticed swelling in legs, and becomes short of breath after walking long distances (> 1 football field length). Reports diet that generally is composed of fried eggs, sausage, and steak. Reports not starting metformin, despite picking up from pharmacy as patient was waiting to have discussion with PCP. Last A1c taken 01/2021 was 6.4%.  PLAN Reconciled medications Obtain updated BMP and ECHO. If ECHO shows EF < 40%, recommend discontinuing amlodipine. Low threshold for d/c of amlodipine given swelling in legs, which may be attributable to either ventricular function and/or adverse effect to calcium channel blockers.  Obtain A1c. Recommend starting metformin after results of A1c given this value is still elevated on repeat. Emphasized the importance of checking weight daily with the goal of assessing  for fluctuations in weight and observing trends in blood pressure Counseled on diet including recommendation of < 2 grams/day of sodium, and low cholesterol protein (chicken as opposed to steak)  Time spent: 15 minutes  Wynelle Cleveland, PharmD Pharmacy Resident 06/17/2021 9:32 AM  Current Outpatient Medications:    acetaminophen (TYLENOL) 650 MG CR tablet, Take 650 mg by mouth every 8 (eight) hours as needed for pain., Disp: , Rfl:    albuterol (VENTOLIN HFA) 108 (90 Base) MCG/ACT inhaler, Inhale 2 puffs into the lungs every 6 (six) hours as needed for wheezing or shortness of breath., Disp: 8 g, Rfl: 0   amLODipine (NORVASC) 10 MG tablet, Take 1 tablet (10 mg total) by mouth daily., Disp: 90 tablet, Rfl: 3   carvedilol (COREG) 25 MG tablet, TAKE 1 TABLET BY MOUTH TWICE DAILY WITH MEALS, Disp: 180 tablet, Rfl: 3   furosemide (LASIX) 40 MG tablet, Take 1 tablet by mouth once daily, Disp: 90 tablet, Rfl: 3   losartan (COZAAR) 100 MG tablet, Take 1 tablet (100 mg total) by mouth daily., Disp: 90 tablet, Rfl: 3   metFORMIN (GLUCOPHAGE) 500 MG tablet, Take 1 tablet (500 mg total) by mouth daily with breakfast. (Patient not taking: Reported on 06/17/2021), Disp: 90 tablet, Rfl: 3   COUNSELING POINTS/CLINICAL PEARLS   DRUGS TO CAUTION IN HEART FAILURE  Drug or Class Mechanism  Analgesics NSAIDs COX-2 inhibitors Glucocorticoids  Sodium and water retention, increased systemic vascular resistance, decreased response to diuretics   Diabetes Medications Metformin Thiazolidinediones Rosiglitazone (Avandia) Pioglitazone (Actos) DPP4 Inhibitors Saxagliptin (Onglyza) Sitagliptin (Januvia)   Lactic acidosis Possible calcium channel blockade   Unknown  Antiarrhythmics Class I  Flecainide Disopyramide Class III Sotalol Other Dronedarone  Negative inotrope, proarrhythmic   Proarrhythmic, beta blockade  Negative inotrope  Antihypertensives Alpha Blockers Doxazosin Calcium  Channel Blockers Diltiazem Verapamil Nifedipine Central Alpha Adrenergics Moxonidine Peripheral Vasodilators Minoxidil  Increases renin and aldosterone  Negative inotrope    Possible sympathetic withdrawal  Unknown  Anti-infective Itraconazole Amphotericin B  Negative inotrope Unknown  Hematologic Anagrelide Cilostazol   Possible inhibition of PD IV Inhibition of PD III causing arrhythmias  Neurologic/Psychiatric Stimulants Anti-Seizure Drugs Carbamazepine Pregabalin Antidepressants Tricyclics Citalopram Parkinsons Bromocriptine Pergolide Pramipexole Antipsychotics Clozapine Antimigraine Ergotamine Methysergide Appetite suppressants Bipolar Lithium  Peripheral alpha and beta agonist activity  Negative inotrope and chronotrope Calcium channel blockade  Negative inotrope, proarrhythmic Dose-dependent QT prolongation  Excessive serotonin activity/valvular damage Excessive serotonin activity/valvular damage Unknown  IgE mediated hypersensitivy, calcium channel blockade  Excessive serotonin activity/valvular damage Excessive serotonin activity/valvular damage Valvular damage  Direct myofibrillar degeneration, adrenergic stimulation  Antimalarials Chloroquine Hydroxychloroquine Intracellular inhibition of lysosomal enzymes  Urologic Agents Alpha Blockers Doxazosin Prazosin Tamsulosin Terazosin  Increased  renin and aldosterone  Adapted from Page RL, et al. "Drugs That May Cause or Exacerbate Heart Failure: A Scientific Statement from the American Heart  Association." Circulation 2016; 134:e32-e69. DOI: 10.1161/CIR.0000000000000426   MEDICATION ADHERENCES TIPS AND STRATEGIES Taking medication as prescribed improves patient outcomes in heart failure (reduces hospitalizations, improves symptoms, increases survival) Side effects of medications can be managed by decreasing doses, switching agents, stopping drugs, or adding additional therapy.  Please let someone in the [facility] know if you have having bothersome side effects so we can modify your regimen. Do not alter your medication regimen without talking to [place].  Medication reminders can help patients remember to take drugs on time. If you are missing or forgetting doses you can try linking behaviors, using pill boxes, or an electronic reminder like an alarm on your phone or an app. Some people can also get automated phone calls as medication reminders.

## 2021-06-18 ENCOUNTER — Other Ambulatory Visit: Admit: 2021-06-18 | Payer: Self-pay

## 2021-06-18 ENCOUNTER — Other Ambulatory Visit
Admission: RE | Admit: 2021-06-18 | Discharge: 2021-06-18 | Disposition: A | Discharge status: Home / Self Care | Payer: Self-pay | Source: Ambulatory Visit | Attending: Family | Admitting: Family

## 2021-06-18 ENCOUNTER — Encounter: Payer: Self-pay | Admitting: Family

## 2021-06-18 DIAGNOSIS — I5032 Chronic diastolic (congestive) heart failure: Secondary | ICD-10-CM | POA: Insufficient documentation

## 2021-07-09 ENCOUNTER — Ambulatory Visit: Admission: RE | Admit: 2021-07-09 | Payer: Self-pay | Source: Ambulatory Visit

## 2021-07-24 ENCOUNTER — Ambulatory Visit: Payer: Self-pay | Admitting: Internal Medicine

## 2021-07-27 NOTE — Progress Notes (Unsigned)
Patient ID: Noah Rodriguez, male    DOB: 11-30-1975, 46 y.o.   MRN: [id number]  HPI  Noah Rodriguez is a 46 y/o male with a history of obstructive sleep apnea, obesity, hypokalemia, gout, asthma and chronic heart failure.   Echo done 08/25/16 reviewed and shows an EF of 60-65%. Echo done 02/14/16 and showed an EF of 35-40% along with mild Noah/TR.    Last cardiac catheterization was done 09/17/14 and showed an EF of 20-25% at that time. No CAD present.   Has not been admitted or been in the ED in the last 6 months.    He presents today for a follow-up visit with a chief complaint of minimal fatigue upon moderate exertion. Describes this as chronic in nature. Has associated head congestion, pedal edema, palpitations and leg pain along with this. He denies any difficulty sleeping, abdominal distention, chest pain, shortness of breath, cough, wheezing, dizziness or weight gain.   Knows that his legs hurt because he stands on concrete most of the night and he's not wearing good supportive shoes. Says that he has shoes at home that have good orthotics in them but he just doesn't wear them.  Has not started the metformin yet but does have it. Says that he just doesn't want to be taking another medication.    Knows that he missed his previous echo but now has his insurance card and would like to r/s it.   Past Medical History:  Diagnosis Date   Acute kidney injury (HCC)    Asthma    Chronic combined systolic and diastolic CHF (congestive heart failure) (HCC)    EF < 25% on Cath (09/17/2014)   Gout    Gout of big toe 09/16/2014   H/O medication noncompliance    Hypertension    Hypertensive heart disease    Hypokalemia    MI (myocardial infarction) [facility])    Patient denies having had a MI   NICM (nonischemic cardiomyopathy) (HCC)    a. cardiac cath 09/2014 with normal coronary arteries, EF 20-25% with severe global HK; b. echo 08/2014: EF 30-35%, false tendon in LV aepx of no clinical sig, diffuse HK,  GR1DD, aortic root 40 mm, trivial Noah, trivial pericardial effusion posterior    Obesity, Class III, BMI 40-49.9 (morbid obesity) (HCC)    Sleep apnea    a. sleep study 05/2010; b. noncompliant with CPAP   Ventral hernia    a. incarcerated omentum 06/10/2015   Past Surgical History:  Procedure Laterality Date   CARDIAC CATHETERIZATION N/A 09/17/2014   Procedure: Right/Left Heart Cath and Coronary Angiography;  Surgeon: Lennette Bihari, MD;  Location: [facility];  Service: Cardiovascular;  Laterality: N/A;   VENTRAL HERNIA REPAIR N/A 06/11/2015   Procedure: HERNIA REPAIR VENTRAL ADULT;  Surgeon: Lattie Haw, MD;  Location: [facility] ORS;  Service: General;  Laterality: N/A;   Family History  Problem Relation Age of Onset   HIV Mother    Hypertension Mother    Hypertension Other    Hypertension Maternal Grandmother    Hypertension Paternal Grandmother    Alcohol abuse Neg Hx    Cancer Neg Hx    Early death Neg Hx    Heart disease Neg Hx    Hyperlipidemia Neg Hx    Kidney disease Neg Hx    Stroke Neg Hx    Heart attack Neg Hx    Social History   Tobacco Use   Smoking status: Never   Smokeless  tobacco: Never  Substance Use Topics   Alcohol use: No   No Known Allergies  Prior to Admission medications   Medication Sig Start Date End Date Taking? Authorizing Provider  acetaminophen (TYLENOL) 650 MG CR tablet Take 650 mg by mouth every 8 (eight) hours as needed for pain.   Yes [provider]  albuterol (VENTOLIN HFA) 108 (90 Base) MCG/ACT inhaler Inhale 2 puffs into the lungs every 6 (six) hours as needed for wheezing or shortness of breath. 01/21/21  Yes Margarita Mail, DO  amLODipine (NORVASC) 10 MG tablet Take 1 tablet (10 mg total) by mouth daily. 03/27/21  Yes Clarisa Kindred A, FNP  carvedilol (COREG) 25 MG tablet TAKE 1 TABLET BY MOUTH TWICE DAILY WITH MEALS 01/29/21  Yes Clarisa Kindred A, FNP  furosemide (LASIX) 40 MG tablet Take 1 tablet by mouth once daily  01/29/21  Yes Severina Sykora A, FNP  losartan (COZAAR) 100 MG tablet Take 1 tablet (100 mg total) by mouth daily. 05/29/21  Yes Delma Freeze, FNP  metFORMIN (GLUCOPHAGE) 500 MG tablet Take 1 tablet (500 mg total) by mouth daily with breakfast. Patient not taking: Reported on 06/17/2021 01/23/21   Margarita Mail, DO    Review of Systems  Constitutional:  Positive for fatigue. Negative for appetite change.  HENT:  Positive for congestion. Negative for postnasal drip and sore throat.   Eyes:  Negative for pain and visual disturbance.  Respiratory:  Negative for cough, chest tightness, shortness of breath and wheezing.   Cardiovascular:  Positive for palpitations and leg swelling (minimal). Negative for chest pain.  Gastrointestinal:  Negative for abdominal distention and constipation.  Endocrine: Negative.   Genitourinary: Negative.   Musculoskeletal:  Positive for arthralgias (legs). Negative for back pain and neck pain.  Skin: Negative.   Allergic/Immunologic: Negative.   Neurological:  Negative for dizziness, weakness, light-headedness and numbness.  Hematological:  Negative for adenopathy. Does not bruise/bleed easily.  Psychiatric/Behavioral:  Negative for dysphoric mood and sleep disturbance (works night shift). The patient is not nervous/anxious.    Vitals:   07/29/21 0826  BP: 131/88  Pulse: 79  Resp: 16  SpO2: 97%  Weight: (!) 373 lb 8 oz (169.4 kg)  Height: 5\' 7"  (1.702 m)   Wt Readings from Last 3 Encounters:  07/29/21 (!) 373 lb 8 oz (169.4 kg)  06/17/21 (!) 374 lb 4 oz (169.8 kg)  03/18/21 (!) 357 lb 3 oz (162 kg)   Lab Results  Component Value Date   CREATININE 1.25 (H) 06/17/2021   CREATININE 1.22 12/17/2020   CREATININE 1.30 (H) 02/15/2019    Physical Exam Vitals and nursing note reviewed.  Constitutional:      Appearance: He is well-developed.  HENT:     Head: Normocephalic and atraumatic.  Neck:     Vascular: No JVD.  Cardiovascular:     Rate and  Rhythm: Normal rate and regular rhythm.  Pulmonary:     Effort: Pulmonary effort is normal.     Breath sounds: No wheezing or rales.  Abdominal:     General: There is no distension.     Palpations: Abdomen is soft.     Tenderness: There is no abdominal tenderness.  Musculoskeletal:        General: No tenderness.     Cervical back: Normal range of motion and neck supple.     Right lower leg: Edema (trace pitting) present.     Left lower leg: Edema (trace pitting) present.  Skin:  General: Skin is warm and dry.  Neurological:     General: No focal deficit present.     Mental Status: He is alert and oriented to person, place, and time.  Psychiatric:        Behavior: Behavior normal.        Thought Content: Thought content normal.     Assessment & Plan:  1: Chronic heart failure with preserved ejection fraction- - NYHA class II - euvolemic - weighing daily; reminded to call for an overnight weight gain of >2 pounds or a weekly weight gain of >5 pounds - weight stable from last visit here 6 weeks ago - says that he drinks a big gulp (30 ounces) from [employer] of soda but fills the cup full of ice first; once he drinks the soda out of it, he fills the cup with the ice in it with water for the rest of the day - says that he sweats a lot at work normally but even more so with this high humidity - not adding any salt to his food - has become more active at work but knows that he needs to get more consistent exercise in - he was a NS for his echo on 07/09/21 but now has his insurance card so this was r/s for 08/07/21 - now on losartan and tolerating it well; was unable to afford entresto and did not qualify for assistance - pro-BNP on 11/04/19 was 82   2: HTN- - BP minimally elevated (131/88) - had video visit with PCP Caralee Ates) 03/20/21 - BMP 06/17/21 reviewed and showed Na 138, K 3.8, GFR >60 and Scr 1.25  3: Sleep apnea- - sleep study referral placed by PCP 01/21/21 - advised him to make  a f/u appointment with PCP to followup the sleep study - NS for pulmonology 03/31/21  4: DM- - A1c 06/17/21 was 6.2% - has metformin but hasn't started taking it - reviewed the indication along with this health risks; he will think about starting it   Medication bottles reviewed.   Return in 3 months, sooner if needed.

## 2021-07-29 ENCOUNTER — Ambulatory Visit: Payer: 59 | Attending: Family | Admitting: Family

## 2021-07-29 ENCOUNTER — Encounter: Payer: Self-pay | Admitting: Family

## 2021-07-29 VITALS — BP 131/88 | HR 79 | Resp 16 | Ht 67.0 in | Wt 373.5 lb

## 2021-07-29 DIAGNOSIS — G4733 Obstructive sleep apnea (adult) (pediatric): Secondary | ICD-10-CM | POA: Diagnosis not present

## 2021-07-29 DIAGNOSIS — J45909 Unspecified asthma, uncomplicated: Secondary | ICD-10-CM | POA: Insufficient documentation

## 2021-07-29 DIAGNOSIS — I110 Hypertensive heart disease with heart failure: Secondary | ICD-10-CM | POA: Insufficient documentation

## 2021-07-29 DIAGNOSIS — E119 Type 2 diabetes mellitus without complications: Secondary | ICD-10-CM | POA: Insufficient documentation

## 2021-07-29 DIAGNOSIS — G4730 Sleep apnea, unspecified: Secondary | ICD-10-CM | POA: Insufficient documentation

## 2021-07-29 DIAGNOSIS — I10 Essential (primary) hypertension: Secondary | ICD-10-CM

## 2021-07-29 DIAGNOSIS — I5032 Chronic diastolic (congestive) heart failure: Secondary | ICD-10-CM | POA: Insufficient documentation

## 2021-07-29 DIAGNOSIS — Z794 Long term (current) use of insulin: Secondary | ICD-10-CM

## 2021-07-29 NOTE — Patient Instructions (Addendum)
Continue weighing daily and call for an overnight weight gain of 3 pounds or more or a weekly weight gain of more than 5 pounds.   If you have voicemail, please make sure your mailbox is cleaned out so that we may leave a message and please make sure to listen to any voicemails.    Your echocardiogram has been scheduled for Friday, July 28th at 9:00am.    Please call your primary care doctor to make an appointment.    Your blood pressure looks great!

## 2021-08-07 ENCOUNTER — Telehealth: Payer: Self-pay | Admitting: Family

## 2021-08-07 ENCOUNTER — Ambulatory Visit
Admission: RE | Admit: 2021-08-07 | Discharge: 2021-08-07 | Disposition: A | Discharge status: Home / Self Care | Payer: 59 | Source: Ambulatory Visit | Attending: Family | Admitting: Family

## 2021-08-07 DIAGNOSIS — I110 Hypertensive heart disease with heart failure: Secondary | ICD-10-CM | POA: Diagnosis not present

## 2021-08-07 DIAGNOSIS — G4730 Sleep apnea, unspecified: Secondary | ICD-10-CM | POA: Diagnosis not present

## 2021-08-07 DIAGNOSIS — I252 Old myocardial infarction: Secondary | ICD-10-CM | POA: Diagnosis not present

## 2021-08-07 DIAGNOSIS — I428 Other cardiomyopathies: Secondary | ICD-10-CM | POA: Insufficient documentation

## 2021-08-07 DIAGNOSIS — I340 Nonrheumatic mitral (valve) insufficiency: Secondary | ICD-10-CM | POA: Insufficient documentation

## 2021-08-07 DIAGNOSIS — I5032 Chronic diastolic (congestive) heart failure: Secondary | ICD-10-CM | POA: Insufficient documentation

## 2021-08-07 LAB — ECHOCARDIOGRAM COMPLETE
AR max vel: 2.63 cm2
AV Area VTI: 2.54 cm2
AV Area mean vel: 2.67 cm2
AV Mean grad: 5 mmHg
AV Peak grad: 8.6 mmHg
Ao pk vel: 1.47 m/s
Area-P 1/2: 4.6 cm2
Calc EF: 42.1 %
S' Lateral: 3.94 cm
Single Plane A2C EF: 41.1 %
Single Plane A4C EF: 42.9 %

## 2021-08-07 NOTE — Progress Notes (Signed)
*  PRELIMINARY RESULTS* Echocardiogram 2D Echocardiogram has been performed.  Noah Rodriguez Dryden Tapley 08/07/2021, 9:00 AM

## 2021-08-07 NOTE — Telephone Encounter (Signed)
Called patient to review echo results. EF has declined from 60-65% back in 2018 to now 40-45%. Explained the results and that we should look at adding medications based on HF guidelines. He will check with his pharmacy to see what his entresto would cost him now that he has insurance.   Will see him next week to discuss medications in more detail. Will probably start SGLT2 next week and hopefully be able to use entresto again.   Patient verbalized understanding.

## 2021-08-12 ENCOUNTER — Encounter: Payer: Self-pay | Admitting: Family

## 2021-08-12 ENCOUNTER — Ambulatory Visit: Payer: 59 | Attending: Family | Admitting: Family

## 2021-08-12 VITALS — BP 164/94 | HR 76 | Resp 18 | Ht 67.0 in | Wt 375.0 lb

## 2021-08-12 DIAGNOSIS — J45909 Unspecified asthma, uncomplicated: Secondary | ICD-10-CM | POA: Diagnosis not present

## 2021-08-12 DIAGNOSIS — G4733 Obstructive sleep apnea (adult) (pediatric): Secondary | ICD-10-CM

## 2021-08-12 DIAGNOSIS — E119 Type 2 diabetes mellitus without complications: Secondary | ICD-10-CM

## 2021-08-12 DIAGNOSIS — I5032 Chronic diastolic (congestive) heart failure: Secondary | ICD-10-CM | POA: Diagnosis not present

## 2021-08-12 DIAGNOSIS — I10 Essential (primary) hypertension: Secondary | ICD-10-CM

## 2021-08-12 DIAGNOSIS — I110 Hypertensive heart disease with heart failure: Secondary | ICD-10-CM | POA: Insufficient documentation

## 2021-08-12 DIAGNOSIS — Z794 Long term (current) use of insulin: Secondary | ICD-10-CM

## 2021-08-12 MED ORDER — EMPAGLIFLOZIN 10 MG PO TABS: 10.0000 mg | ORAL_TABLET | Freq: Every day | ORAL | 0 refills | Status: DC

## 2021-08-12 NOTE — Progress Notes (Signed)
Patient ID: Noah Rodriguez, male    DOB: Jan 29, 1975, 46 y.o.   MRN: [id number]  HPI  Mr Sar is a 46 y/o male with a history of obstructive sleep apnea, obesity, hypokalemia, gout, asthma and chronic heart failure.   Recent Echo 08/07/21 40%-45% Echo done 08/25/16 reviewed and shows an EF of 60-65%. Echo done 02/14/16 and showed an EF of 35-40% along with mild MR/TR.    Last cardiac catheterization was done 09/17/14 and showed an EF of 20-25% at that time. No CAD present.   Has not been admitted or been in the ED in the last 6 months.    He presents today for a follow-up visit with a chief complaint of chronic pedal edema and sleep apnea. Patient calm no agitation present. Patient will make appointment for sleep test. He denies fatigues, headaches, dizziness, SOB, chest pain/pressure. Denies any congestion or runny nose. Denies abdominal pain, constipation, blood in the stool or diarrhea. Denies any pain with urination. Does suffer from sleep apnea, has chronic pedal edema, and occasional palpitations.  Patient will make sleep study appointment today. 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 He is going to start making better nutritional decisions in addition to losing weight. He wears compression hoses twice a week but will wear daily especially when standing on feet for long periods of time. Discussed with patient starting metformin and jardiance and benefits of both medications. Patient already had metformin filled and will start taking today. 14 day supply of jardiance was given as well as future coupon. Patient will follow-up in 1 month.   Past Medical History:  Diagnosis Date   Acute kidney injury (HCC)    Asthma    Chronic combined systolic and diastolic CHF (congestive heart failure) (HCC)    EF < 25% on Cath (09/17/2014)   Gout    Gout of big toe 09/16/2014   H/O  medication noncompliance    Hypertension    Hypertensive heart disease    Hypokalemia    MI (myocardial infarction) [facility])    Patient denies having had a MI   NICM (nonischemic cardiomyopathy) (HCC)    a. cardiac cath 09/2014 with normal coronary arteries, EF 20-25% with severe global HK; b. echo 08/2014: EF 30-35%, false tendon in LV aepx of no clinical sig, diffuse HK, GR1DD, aortic root 40 mm, trivial MR, trivial pericardial effusion posterior    Obesity, Class III, BMI 40-49.9 (morbid obesity) (HCC)    Sleep apnea    a. sleep study 05/2010; b. noncompliant with CPAP   Ventral hernia    a. incarcerated omentum 06/10/2015   Past Surgical History:  Procedure Laterality Date   CARDIAC CATHETERIZATION N/A 09/17/2014   Procedure: Right/Left Heart Cath and Coronary Angiography;  Surgeon: Lennette Bihari, MD;  Location: [facility];  Service: Cardiovascular;  Laterality: N/A;   VENTRAL HERNIA REPAIR N/A 06/11/2015   Procedure: HERNIA REPAIR VENTRAL ADULT;  Surgeon: Lattie Haw, MD;  Location: [facility] ORS;  Service: General;  Laterality: N/A;   Family History  Problem Relation Age of Onset   HIV Mother    Hypertension Mother    Hypertension Other    Hypertension Maternal Grandmother    Hypertension Paternal Grandmother    Alcohol abuse Neg Hx    Cancer Neg Hx    Early death Neg Hx  Heart disease Neg Hx    Hyperlipidemia Neg Hx    Kidney disease Neg Hx    Stroke Neg Hx    Heart attack Neg Hx    Social History   Tobacco Use   Smoking status: Never   Smokeless tobacco: Never  Substance Use Topics   Alcohol use: No   No Known Allergies  Prior to Admission medications   Medication Sig Start Date End Date Taking? Authorizing Provider  acetaminophen (TYLENOL) 650 MG CR tablet Take 650 mg by mouth every 8 (eight) hours as needed for pain.   Yes [provider]  albuterol (VENTOLIN HFA) 108 (90 Base) MCG/ACT inhaler Inhale 2 puffs into the lungs every 6 (six) hours as  needed for wheezing or shortness of breath. 01/21/21  Yes Margarita Mail, DO  amLODipine (NORVASC) 10 MG tablet Take 1 tablet (10 mg total) by mouth daily. 03/27/21  Yes Clarisa Kindred A, FNP  carvedilol (COREG) 25 MG tablet TAKE 1 TABLET BY MOUTH TWICE DAILY WITH MEALS 01/29/21  Yes Clarisa Kindred A, FNP  furosemide (LASIX) 40 MG tablet Take 1 tablet by mouth once daily 01/29/21  Yes Khaiden Segreto A, FNP  losartan (COZAAR) 100 MG tablet Take 1 tablet (100 mg total) by mouth daily. 05/29/21  Yes Delma Freeze, FNP  metFORMIN (GLUCOPHAGE) 500 MG tablet Take 1 tablet (500 mg total) by mouth daily with breakfast. Patient not taking: Reported on 06/17/2021 01/23/21   Margarita Mail, DO    Review of Systems  Constitutional:  Negative for appetite change and fatigue.  HENT:  Negative for congestion, postnasal drip and sore throat.   Eyes:  Negative for pain and visual disturbance.  Respiratory:  Positive for apnea. Negative for cough, chest tightness, shortness of breath and wheezing.   Cardiovascular:  Positive for palpitations and leg swelling (minimal). Negative for chest pain.  Gastrointestinal:  Negative for abdominal distention, abdominal pain, constipation, nausea and vomiting.  Endocrine: Negative.   Genitourinary:  Negative for difficulty urinating and frequency.  Musculoskeletal:  Negative for arthralgias, back pain and neck pain.  Skin: Negative.   Allergic/Immunologic: Negative.   Neurological:  Negative for dizziness, weakness, light-headedness, numbness and headaches.  Hematological:  Negative for adenopathy. Does not bruise/bleed easily.  Psychiatric/Behavioral:  Negative for dysphoric mood and sleep disturbance. The patient is not nervous/anxious.    Vitals:   08/12/21 0819  BP: (!) 164/94  Pulse: 76  Resp: 18  SpO2: 99%  Weight: (!) 375 lb (170.1 kg)  Height: 5\' 7"  (1.702 m)   Wt Readings from Last 3 Encounters:  08/12/21 (!) 375 lb (170.1 kg)  07/29/21 (!) 373 lb 8 oz  (169.4 kg)  06/17/21 (!) 374 lb 4 oz (169.8 kg)    Lab Results  Component Value Date   CREATININE 1.25 (H) 06/17/2021   CREATININE 1.22 12/17/2020   CREATININE 1.30 (H) 02/15/2019      Physical Exam Vitals and nursing note reviewed.  Constitutional:      Appearance: He is well-developed. He is obese.  HENT:     Head: Normocephalic and atraumatic.  Neck:     Vascular: No JVD.  Cardiovascular:     Rate and Rhythm: Normal rate and regular rhythm.  Pulmonary:     Effort: Pulmonary effort is normal.     Breath sounds: No wheezing or rales.  Abdominal:     General: There is no distension.     Palpations: Abdomen is soft.     Tenderness:  There is no abdominal tenderness.  Musculoskeletal:        General: No tenderness.     Cervical back: Normal range of motion and neck supple.     Right lower leg: Edema (trace pitting) present.     Left lower leg: Edema (trace pitting) present.  Skin:    General: Skin is warm and dry.  Neurological:     General: No focal deficit present.     Mental Status: He is alert and oriented to person, place, and time.  Psychiatric:        Behavior: Behavior normal.        Thought Content: Thought content normal.     Assessment & Plan:  1: Chronic heart failure with preserved ejection fraction- - NYHA class II - euvolemic - weighing daily; reminded to call for an overnight weight gain of >2 pounds or a weekly weight gain of >5 pounds - weight 373.8 07/29/21 stable from last visit here 6 weeks ago - says that he drinks a big gulp (30 ounces) from [employer] of soda but fills the cup full of ice first; once he drinks the soda out of it, he fills the cup with the ice in it with water for the rest of the day - says that he sweats a lot at work normally but even more so with this high humidity - not adding any salt to his food - has become more active at work but knows that he needs to get more consistent exercise in -benefits of jardiance and metformin  discussed and patient will begin taking today -will check labs at next visit -discussed with patient benefits of continuing wearing compression stockings daily - pro-BNP on 11/04/19 was 82   2: HTN- - BP minimally elevated (131/88) - had video visit with PCP Caralee Ates) 03/20/21 - BMP 08/12/21 164/94 reviewed and labs 06/17/21 showed Na 138, K 3.8, GFR >60 and Scr 1.25  3: Sleep apnea- - sleep study referral placed by PCP 01/21/21 - advised him to make a f/u appointment with PCP to followup the sleep study - NS for pulmonology 03/31/21  4: DM- - A1c 06/17/21 was 6.2% - Will begin metformin but hasn't started taking it  Medication bottles reviewed.   Return in 1 months, sooner if needed.

## 2021-08-12 NOTE — Patient Instructions (Addendum)
Start Jardiance one tablet by mouth once daily.   Low-Sodium Eating Plan Sodium, which is an element that makes up salt, helps you maintain a healthy balance of fluids in your body. Too much sodium can increase your blood pressure and cause fluid and waste to be held in your body. Your health care provider or dietitian may recommend following this plan if you have high blood pressure (hypertension), kidney disease, liver disease, or heart failure. Eating less sodium can help lower your blood pressure, reduce swelling, and protect your heart, liver, and kidneys. What are tips for following this plan? Reading food labels The Nutrition Facts label lists the amount of sodium in one serving of the food. If you eat more than one serving, you must multiply the listed amount of sodium by the number of servings. Choose foods with less than 140 mg of sodium per serving. Avoid foods with 300 mg of sodium or more per serving. Shopping  Look for lower-sodium products, often labeled as "low-sodium" or "no salt added." Always check the sodium content, even if foods are labeled as "unsalted" or "no salt added." Buy fresh foods. Avoid canned foods and pre-made or frozen meals. Avoid canned, cured, or processed meats. Buy breads that have less than 80 mg of sodium per slice. Cooking  Eat more home-cooked food and less restaurant, buffet, and fast food. Avoid adding salt when cooking. Use salt-free seasonings or herbs instead of table salt or sea salt. Check with your health care provider or pharmacist before using salt substitutes. Cook with plant-based oils, such as canola, sunflower, or olive oil. Meal planning When eating at a restaurant, ask that your food be prepared with less salt or no salt, if possible. Avoid dishes labeled as brined, pickled, cured, smoked, or made with soy sauce, miso, or teriyaki sauce. Avoid foods that contain MSG (monosodium glutamate). MSG is sometimes added to [place] food,  bouillon, and some canned foods. Make meals that can be grilled, baked, poached, roasted, or steamed. These are generally made with less sodium. General information Most people on this plan should limit their sodium intake to 1,500-2,000 mg (milligrams) of sodium each day. What foods should I eat? Fruits Fresh, frozen, or canned fruit. Fruit juice. Vegetables Fresh or frozen vegetables. "No salt added" canned vegetables. "No salt added" tomato sauce and paste. Low-sodium or reduced-sodium tomato and vegetable juice. Grains Low-sodium cereals, including oats, puffed wheat and rice, and shredded wheat. Low-sodium crackers. Unsalted rice. Unsalted pasta. Low-sodium bread. Whole-grain breads and whole-grain pasta. Meats and other proteins Fresh or frozen (no salt added) meat, poultry, seafood, and fish. Low-sodium canned tuna and salmon. Unsalted nuts. Dried peas, beans, and lentils without added salt. Unsalted canned beans. Eggs. Unsalted nut butters. Dairy Milk. Soy milk. Cheese that is naturally low in sodium, such as ricotta cheese, fresh mozzarella, or Swiss cheese. Low-sodium or reduced-sodium cheese. Cream cheese. Yogurt. Seasonings and condiments Fresh and dried herbs and spices. Salt-free seasonings. Low-sodium mustard and ketchup. Sodium-free salad dressing. Sodium-free light mayonnaise. Fresh or refrigerated horseradish. Lemon juice. Vinegar. Other foods Homemade, reduced-sodium, or low-sodium soups. Unsalted popcorn and pretzels. Low-salt or salt-free chips. The items listed above may not be a complete list of foods and beverages you can eat. Contact a dietitian for more information. What foods should I avoid? Vegetables Sauerkraut, pickled vegetables, and relishes. Olives. [place] fries. Onion rings. Regular canned vegetables (not low-sodium or reduced-sodium). Regular canned tomato sauce and paste (not low-sodium or reduced-sodium). Regular tomato and vegetable juice (  not low-sodium or  reduced-sodium). Frozen vegetables in sauces. Grains Instant hot cereals. Bread stuffing, pancake, and biscuit mixes. Croutons. Seasoned rice or pasta mixes. Noodle soup cups. Boxed or frozen macaroni and cheese. Regular salted crackers. Self-rising flour. Meats and other proteins Meat or fish that is salted, canned, smoked, spiced, or pickled. Precooked or cured meat, such as sausages or meat loaves. Noah Rodriguez. Ham. Pepperoni. Hot dogs. Corned beef. Chipped beef. Salt pork. Jerky. Pickled herring. Anchovies and sardines. Regular canned tuna. Salted nuts. Dairy Processed cheese and cheese spreads. Hard cheeses. Cheese curds. Blue cheese. Feta cheese. String cheese. Regular cottage cheese. Buttermilk. Canned milk. Fats and oils Salted butter. Regular margarine. Ghee. Bacon fat. Seasonings and condiments Onion salt, garlic salt, seasoned salt, table salt, and sea salt. Canned and packaged gravies. Worcestershire sauce. Tartar sauce. Barbecue sauce. Teriyaki sauce. Soy sauce, including reduced-sodium. Steak sauce. Fish sauce. Oyster sauce. Cocktail sauce. Horseradish that you find on the shelf. Regular ketchup and mustard. Meat flavorings and tenderizers. Bouillon cubes. Hot sauce. Pre-made or packaged marinades. Pre-made or packaged taco seasonings. Relishes. Regular salad dressings. Salsa. Other foods Salted popcorn and pretzels. Corn chips and puffs. Potato and tortilla chips. Canned or dried soups. Pizza. Frozen entrees and pot pies. The items listed above may not be a complete list of foods and beverages you should avoid. Contact a dietitian for more information. Summary Eating less sodium can help lower your blood pressure, reduce swelling, and protect your heart, liver, and kidneys. Most people on this plan should limit their sodium intake to 1,500-2,000 mg (milligrams) of sodium each day. Canned, boxed, and frozen foods are high in sodium. Restaurant foods, fast foods, and pizza are also very high  in sodium. You also get sodium by adding salt to food. Try to cook at home, eat more fresh fruits and vegetables, and eat less fast food and canned, processed, or prepared foods. This information is not intended to replace advice given to you by your health care provider. Make sure you discuss any questions you have with your health care provider. Document Revised: 02/02/2019 Document Reviewed: 11/29/2018 Elsevier Patient Education  2023 Elsevier Inc.   Continue weighing daily and call for an overnight weight gain of 3 pounds or more or a weekly weight gain of more than 5 pounds.  Continue wearing compression stockings.   Patient to make sleep study appointment.  Follow-up one month

## 2021-09-16 ENCOUNTER — Ambulatory Visit: Payer: 59 | Admitting: Family

## 2021-09-16 ENCOUNTER — Telehealth: Payer: Self-pay | Admitting: Family

## 2021-09-16 NOTE — Progress Notes (Deleted)
Patient ID: Noah Rodriguez, male    DOB: Jan 29, 1975, 46 y.o.   MRN: [id number]  HPI  Noah Rodriguez is a 46 y/o male with a history of obstructive sleep apnea, obesity, hypokalemia, gout, asthma and chronic heart failure.   Recent Echo 08/07/21 40%-45% Echo done 08/25/16 reviewed and shows an EF of 60-65%. Echo done 02/14/16 and showed an EF of 35-40% along with mild Noah/TR.    Last cardiac catheterization was done 09/17/14 and showed an EF of 20-25% at that time. No CAD present.   Has not been admitted or been in the ED in the last 6 months.    He presents today for a follow-up visit with a chief complaint of chronic pedal edema and sleep apnea. Patient calm no agitation present. Patient will make appointment for sleep test. He denies fatigues, headaches, dizziness, SOB, chest pain/pressure. Denies any congestion or runny nose. Denies abdominal pain, constipation, blood in the stool or diarrhea. Denies any pain with urination. Does suffer from sleep apnea, has chronic pedal edema, and occasional palpitations.  Patient will make sleep study appointment today. 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 He is going to start making better nutritional decisions in addition to losing weight. He wears compression hoses twice a week but will wear daily especially when standing on feet for long periods of time. Discussed with patient starting metformin and jardiance and benefits of both medications. Patient already had metformin filled and will start taking today. 14 day supply of jardiance was given as well as future coupon. Patient will follow-up in 1 month.   Past Medical History:  Diagnosis Date   Acute kidney injury (HCC)    Asthma    Chronic combined systolic and diastolic CHF (congestive heart failure) (HCC)    EF < 25% on Cath (09/17/2014)   Gout    Gout of big toe 09/16/2014   H/O  medication noncompliance    Hypertension    Hypertensive heart disease    Hypokalemia    MI (myocardial infarction) [facility])    Patient denies having had a MI   NICM (nonischemic cardiomyopathy) (HCC)    a. cardiac cath 09/2014 with normal coronary arteries, EF 20-25% with severe global HK; b. echo 08/2014: EF 30-35%, false tendon in LV aepx of no clinical sig, diffuse HK, GR1DD, aortic root 40 mm, trivial Noah, trivial pericardial effusion posterior    Obesity, Class III, BMI 40-49.9 (morbid obesity) (HCC)    Sleep apnea    a. sleep study 05/2010; b. noncompliant with CPAP   Ventral hernia    a. incarcerated omentum 06/10/2015   Past Surgical History:  Procedure Laterality Date   CARDIAC CATHETERIZATION N/A 09/17/2014   Procedure: Right/Left Heart Cath and Coronary Angiography;  Surgeon: Lennette Bihari, MD;  Location: [facility];  Service: Cardiovascular;  Laterality: N/A;   VENTRAL HERNIA REPAIR N/A 06/11/2015   Procedure: HERNIA REPAIR VENTRAL ADULT;  Surgeon: Lattie Haw, MD;  Location: [facility] ORS;  Service: General;  Laterality: N/A;   Family History  Problem Relation Age of Onset   HIV Mother    Hypertension Mother    Hypertension Other    Hypertension Maternal Grandmother    Hypertension Paternal Grandmother    Alcohol abuse Neg Hx    Cancer Neg Hx    Early death Neg Hx  Heart disease Neg Hx    Hyperlipidemia Neg Hx    Kidney disease Neg Hx    Stroke Neg Hx    Heart attack Neg Hx    Social History   Tobacco Use   Smoking status: Never   Smokeless tobacco: Never  Substance Use Topics   Alcohol use: No   No Known Allergies  Prior to Admission medications   Medication Sig Start Date End Date Taking? Authorizing Provider  acetaminophen (TYLENOL) 650 MG CR tablet Take 650 mg by mouth every 8 (eight) hours as needed for pain.   Yes [provider]  albuterol (VENTOLIN HFA) 108 (90 Base) MCG/ACT inhaler Inhale 2 puffs into the lungs every 6 (six) hours as  needed for wheezing or shortness of breath. 01/21/21  Yes Margarita Mail, DO  amLODipine (NORVASC) 10 MG tablet Take 1 tablet (10 mg total) by mouth daily. 03/27/21  Yes Clarisa Kindred A, FNP  carvedilol (COREG) 25 MG tablet TAKE 1 TABLET BY MOUTH TWICE DAILY WITH MEALS 01/29/21  Yes Clarisa Kindred A, FNP  furosemide (LASIX) 40 MG tablet Take 1 tablet by mouth once daily 01/29/21  Yes Hackney, Tina A, FNP  losartan (COZAAR) 100 MG tablet Take 1 tablet (100 mg total) by mouth daily. 05/29/21  Yes Delma Freeze, FNP  metFORMIN (GLUCOPHAGE) 500 MG tablet Take 1 tablet (500 mg total) by mouth daily with breakfast. Patient not taking: Reported on 06/17/2021 01/23/21   Margarita Mail, DO    Review of Systems  Constitutional:  Negative for appetite change and fatigue.  HENT:  Negative for congestion, postnasal drip and sore throat.   Eyes:  Negative for pain and visual disturbance.  Respiratory:  Positive for apnea. Negative for cough, chest tightness, shortness of breath and wheezing.   Cardiovascular:  Positive for palpitations and leg swelling (minimal). Negative for chest pain.  Gastrointestinal:  Negative for abdominal distention, abdominal pain, constipation, nausea and vomiting.  Endocrine: Negative.   Genitourinary:  Negative for difficulty urinating and frequency.  Musculoskeletal:  Negative for arthralgias, back pain and neck pain.  Skin: Negative.   Allergic/Immunologic: Negative.   Neurological:  Negative for dizziness, weakness, light-headedness, numbness and headaches.  Hematological:  Negative for adenopathy. Does not bruise/bleed easily.  Psychiatric/Behavioral:  Negative for dysphoric mood and sleep disturbance. The patient is not nervous/anxious.    There were no vitals filed for this visit.  Wt Readings from Last 3 Encounters:  08/12/21 (!) 375 lb (170.1 kg)  07/29/21 (!) 373 lb 8 oz (169.4 kg)  06/17/21 (!) 374 lb 4 oz (169.8 kg)    Lab Results  Component Value Date    CREATININE 1.25 (H) 06/17/2021   CREATININE 1.22 12/17/2020   CREATININE 1.30 (H) 02/15/2019      Physical Exam Vitals and nursing note reviewed.  Constitutional:      Appearance: He is well-developed. He is obese.  HENT:     Head: Normocephalic and atraumatic.  Neck:     Vascular: No JVD.  Cardiovascular:     Rate and Rhythm: Normal rate and regular rhythm.  Pulmonary:     Effort: Pulmonary effort is normal.     Breath sounds: No wheezing or rales.  Abdominal:     General: There is no distension.     Palpations: Abdomen is soft.     Tenderness: There is no abdominal tenderness.  Musculoskeletal:        General: No tenderness.     Cervical back: Normal  range of motion and neck supple.     Right lower leg: Edema (trace pitting) present.     Left lower leg: Edema (trace pitting) present.  Skin:    General: Skin is warm and dry.  Neurological:     General: No focal deficit present.     Mental Status: He is alert and oriented to person, place, and time.  Psychiatric:        Behavior: Behavior normal.        Thought Content: Thought content normal.     Assessment & Plan:  1: Chronic heart failure with preserved ejection fraction- - NYHA class II - euvolemic - weighing daily; reminded to call for an overnight weight gain of >2 pounds or a weekly weight gain of >5 pounds - weight 373.8 07/29/21 stable from last visit here 6 weeks ago - says that he drinks a big gulp (30 ounces) from [employer] of soda but fills the cup full of ice first; once he drinks the soda out of it, he fills the cup with the ice in it with water for the rest of the day - says that he sweats a lot at work normally but even more so with this high humidity - not adding any salt to his food - has become more active at work but knows that he needs to get more consistent exercise in -benefits of jardiance and metformin discussed and patient will begin taking today -will check labs at next visit -discussed with  patient benefits of continuing wearing compression stockings daily - pro-BNP on 11/04/19 was 82   2: HTN- - BP minimally elevated (131/88) - had video visit with PCP Caralee Ates) 03/20/21 - BMP  06/17/21 showed Na 138, K 3.8, GFR >60 and Scr 1.25  3: Sleep apnea- - sleep study referral placed by PCP 01/21/21 - advised him to make a f/u appointment with PCP to followup the sleep study - NS for pulmonology 03/31/21  4: DM- - A1c 06/17/21 was 6.2% - Will begin metformin but hasn't started taking it  Medication bottles reviewed.   Return in  months, sooner if needed.

## 2021-09-16 NOTE — Telephone Encounter (Signed)
Patient did not show for his [facility] appointment on 09/16/21. Will attempt to reschedule.

## 2021-10-06 ENCOUNTER — Ambulatory Visit: Payer: 59 | Admitting: Family

## 2021-10-09 ENCOUNTER — Encounter: Payer: Self-pay | Admitting: Family

## 2021-10-09 ENCOUNTER — Ambulatory Visit: Payer: 59 | Attending: Family | Admitting: Family

## 2021-10-09 VITALS — BP 192/101 | HR 86 | Resp 20 | Ht 67.0 in | Wt 369.0 lb

## 2021-10-09 DIAGNOSIS — I5042 Chronic combined systolic (congestive) and diastolic (congestive) heart failure: Secondary | ICD-10-CM | POA: Diagnosis not present

## 2021-10-09 DIAGNOSIS — E119 Type 2 diabetes mellitus without complications: Secondary | ICD-10-CM | POA: Insufficient documentation

## 2021-10-09 DIAGNOSIS — Z6841 Body Mass Index (BMI) 40.0 and over, adult: Secondary | ICD-10-CM | POA: Diagnosis not present

## 2021-10-09 DIAGNOSIS — I110 Hypertensive heart disease with heart failure: Secondary | ICD-10-CM | POA: Insufficient documentation

## 2021-10-09 DIAGNOSIS — R0982 Postnasal drip: Secondary | ICD-10-CM | POA: Insufficient documentation

## 2021-10-09 DIAGNOSIS — I5032 Chronic diastolic (congestive) heart failure: Secondary | ICD-10-CM | POA: Diagnosis not present

## 2021-10-09 DIAGNOSIS — Z7901 Long term (current) use of anticoagulants: Secondary | ICD-10-CM | POA: Diagnosis not present

## 2021-10-09 DIAGNOSIS — G4733 Obstructive sleep apnea (adult) (pediatric): Secondary | ICD-10-CM | POA: Insufficient documentation

## 2021-10-09 DIAGNOSIS — R609 Edema, unspecified: Secondary | ICD-10-CM | POA: Diagnosis not present

## 2021-10-09 DIAGNOSIS — Z7984 Long term (current) use of oral hypoglycemic drugs: Secondary | ICD-10-CM | POA: Diagnosis not present

## 2021-10-09 DIAGNOSIS — M25562 Pain in left knee: Secondary | ICD-10-CM | POA: Insufficient documentation

## 2021-10-09 DIAGNOSIS — I10 Essential (primary) hypertension: Secondary | ICD-10-CM

## 2021-10-09 DIAGNOSIS — R002 Palpitations: Secondary | ICD-10-CM | POA: Insufficient documentation

## 2021-10-09 DIAGNOSIS — Z794 Long term (current) use of insulin: Secondary | ICD-10-CM

## 2021-10-09 DIAGNOSIS — J45909 Unspecified asthma, uncomplicated: Secondary | ICD-10-CM | POA: Diagnosis not present

## 2021-10-09 NOTE — Patient Instructions (Signed)
Continue weighing daily and call for an overnight weight gain of 3 pounds or more or a weekly weight gain of more than 5 pounds  If you have voicemail, please make sure your mailbox is cleaned out so that we may leave a message and please make sure to listen to any voicemails.    If you receive a satisfaction survey regarding the [facility], please take the time to fill it out. This way we can continue to provide excellent care and make any changes that need to be made.     
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
## 2021-10-28 ENCOUNTER — Ambulatory Visit: Payer: 59 | Admitting: Family

## 2021-11-04 ENCOUNTER — Encounter: Payer: Self-pay | Admitting: Family

## 2021-11-04 ENCOUNTER — Ambulatory Visit: Payer: 59 | Attending: Family | Admitting: Family

## 2021-11-04 ENCOUNTER — Telehealth: Payer: Self-pay | Admitting: Pharmacist

## 2021-11-04 ENCOUNTER — Encounter: Payer: Self-pay | Admitting: Pharmacist

## 2021-11-04 VITALS — BP 185/105 | HR 85 | Resp 20 | Ht 67.0 in | Wt 371.0 lb

## 2021-11-04 DIAGNOSIS — G8929 Other chronic pain: Secondary | ICD-10-CM | POA: Insufficient documentation

## 2021-11-04 DIAGNOSIS — E119 Type 2 diabetes mellitus without complications: Secondary | ICD-10-CM | POA: Insufficient documentation

## 2021-11-04 DIAGNOSIS — Z6841 Body Mass Index (BMI) 40.0 and over, adult: Secondary | ICD-10-CM | POA: Diagnosis not present

## 2021-11-04 DIAGNOSIS — I5032 Chronic diastolic (congestive) heart failure: Secondary | ICD-10-CM

## 2021-11-04 DIAGNOSIS — G4733 Obstructive sleep apnea (adult) (pediatric): Secondary | ICD-10-CM | POA: Diagnosis not present

## 2021-11-04 DIAGNOSIS — I110 Hypertensive heart disease with heart failure: Secondary | ICD-10-CM | POA: Diagnosis not present

## 2021-11-04 DIAGNOSIS — I5042 Chronic combined systolic (congestive) and diastolic (congestive) heart failure: Secondary | ICD-10-CM | POA: Insufficient documentation

## 2021-11-04 DIAGNOSIS — J45909 Unspecified asthma, uncomplicated: Secondary | ICD-10-CM | POA: Diagnosis not present

## 2021-11-04 DIAGNOSIS — R197 Diarrhea, unspecified: Secondary | ICD-10-CM | POA: Diagnosis not present

## 2021-11-04 DIAGNOSIS — M25562 Pain in left knee: Secondary | ICD-10-CM | POA: Insufficient documentation

## 2021-11-04 DIAGNOSIS — I10 Essential (primary) hypertension: Secondary | ICD-10-CM | POA: Diagnosis not present

## 2021-11-04 DIAGNOSIS — R0602 Shortness of breath: Secondary | ICD-10-CM | POA: Diagnosis present

## 2021-11-04 DIAGNOSIS — Z794 Long term (current) use of insulin: Secondary | ICD-10-CM

## 2021-11-04 DIAGNOSIS — Z7984 Long term (current) use of oral hypoglycemic drugs: Secondary | ICD-10-CM | POA: Insufficient documentation

## 2021-11-04 MED ORDER — EMPAGLIFLOZIN 10 MG PO TABS: 10.0000 mg | ORAL_TABLET | Freq: Every day | ORAL | 3 refills | Status: DC

## 2021-11-04 NOTE — Addendum Note (Signed)
Addended by: Harrington Challenger on: 11/04/2021 02:19 PM   Modules accepted: Orders

## 2021-11-04 NOTE — Telephone Encounter (Signed)
Insurance added to patient records. Refill for Jardiance sent to pharmacy.

## 2021-11-04 NOTE — Patient Instructions (Addendum)
Continue weighing daily and call for an overnight weight gain of 3 pounds or more or a weekly weight gain of more than 5 pounds.   If you have voicemail, please make sure your mailbox is cleaned out so that we may leave a message and please make sure to listen to any voicemails.    Start taking amlodipine in the evening. You can take your furosemide (lasix) in the evening.    Resume jardiance once daily    Decrease fluid intake to 60-64 ounces daily (4 bottles daily is the goal)

## 2021-11-04 NOTE — Progress Notes (Signed)
Patient ID: Noah Rodriguez, male   DOB: July 08, 1975, 46 y.o.   MRN: [id number] [facility] HE[facility] FAILURE [facility]INIC - PHARMACIST COUNSELING NOTE  Guideline-Directed Medical Therapy/Evidence Based Medicine  ACE/ARB/ARNI: Losartan 100 mg daily Beta Blocker: Carvedilol 25 mg twice daily Aldosterone Antagonist: None Diuretic: Furosemide 40 mg daily SGLT2i: empagliflozin 10mg  (out of med)  Adherence Assessment  Do you ever forget to take your medication? [] Yes [x] No  Do you ever skip doses due to side effects? [] Yes [x] No  Do you have trouble affording your medicines? [] Yes [x] No  Are you ever unable to pick up your medication due to transportation difficulties? [] Yes [x] No  Do you ever stop taking your medications because you don't believe they are helping? [] Yes [x] No  Do you check your weight daily? [] Yes [x] No   Adherence strategy: associates medications with time of day  Barriers to obtaining medications: none  Vital signs: HR 86, BP 158/97, weight (pounds) 374 lb ECHO: Date 08/14/2016, EF 60-65%, notes: normal systolic function; wall motion normal; no regional wall motion abnormalities; normal left ventricular function; normal study Cath: Date 09/17/14, EF 20-25%, notes: left ventricular dilatation with severe global hypo-contractility     Latest Ref Rng & Units 06/17/2021    9:09 AM 12/17/2020   10:23 AM 02/15/2019    9:19 AM  BMP  Glucose 70 - 99 mg/dL     BUN 6 - 20 mg/dL 25  18  19    Creatinine 0.61 - 1.24 mg/dL     Sodium 135 - 145 mmol/L 138  135  139   Potassium 3.5 - 5.1 mmol/L 3.8  3.7  4.0   Chloride 98 - 111 mmol/L 106  101  102   CO2 22 - 32 mmol/L 25  27  28    Calcium 8.9 - 10.3 mg/dL 8.9  8.7  8.9     Past Medical History:  Diagnosis Date   Acute kidney injury (HCC)    Asthma    Chronic combined systolic and diastolic CHF (congestive heart failure) (HCC)    EF < 25% on Cath (09/17/2014)   Gout    Gout of big  toe 09/16/2014   H/O medication noncompliance    Hypertension    Hypertensive heart disease    Hypokalemia    MI (myocardial infarction) [facility])    Patient denies having had a MI   NICM (nonischemic cardiomyopathy) (HCC)    a. cardiac cath 09/2014 with normal coronary arteries, EF 20-25% with severe global HK; b. echo 08/2014: EF 30-35%, false tendon in LV aepx of no clinical sig, diffuse HK, GR1DD, aortic root 40 mm, trivial MR, trivial pericardial effusion posterior    Obesity, Class III, BMI 40-49.9 (morbid obesity) (HCC)    Sleep apnea    a. sleep study 05/2010; b. noncompliant with CPAP   Ventral hernia    a. incarcerated omentum 06/10/2015    ASSESSMENT 46 year old male who presents to the HF clinic for follow up. PMH relevant to sleep apnea, obesity, gout, asthma, CHF. In 2016, cath showed EF of 20-25%, then in 2018 ECHO with EF 60-65% and essentially normal study. No other ECHO's since this date due to cost and uninsured status. Patient works nightshift, and takes medications when arrives home and evening medications before leaving for work. Reports diet that generally is composed of fried eggs, sausage, and steak.   Medication reconciliation was completed today during appointment. Patient denies issues with medication but  lifestyle and diet compliance remain challenging.   PLAN Resume Jardiance 10mg  daily - working with pharmacy and insurance to determine cost and affordability. Do not refill losartan prior to next follow up. Plan to change to valsartan or Entresto to improve BP control. Counseled on diet including recommendation of < 2 grams/day of sodium, and low cholesterol protein (chicken as opposed to steak)  Time spent: 20 minutes  Noah Rodriguez PharmD, BCPS 11/04/2021 2:11 PM  Current Outpatient Medications:    acetaminophen (TYLENOL) 650 MG CR tablet, Take 650 mg by mouth every 8 (eight) hours as needed for pain., Disp: , Rfl:    albuterol (VENTOLIN HFA) 108 (90  Base) MCG/ACT inhaler, Inhale 2 puffs into the lungs every 6 (six) hours as needed for wheezing or shortness of breath., Disp: 8 g, Rfl: 0   amLODipine (NORVASC) 10 MG tablet, Take 1 tablet (10 mg total) by mouth daily., Disp: 90 tablet, Rfl: 3   carvedilol (COREG) 25 MG tablet, TAKE 1 TABLET BY MOUTH TWICE DAILY WITH MEALS, Disp: 180 tablet, Rfl: 3   empagliflozin (JARDIANCE) 10 MG TABS tablet, Take 1 tablet (10 mg total) by mouth daily before breakfast. (Patient not taking: Reported on 10/09/2021), Disp: 14 tablet, Rfl: 0   furosemide (LASIX) 40 MG tablet, Take 1 tablet by mouth once daily, Disp: 90 tablet, Rfl: 3   ibuprofen (ADVIL) 200 MG tablet, Take 600 mg by mouth every 6 (six) hours as needed for moderate pain or mild pain., Disp: , Rfl:    losartan (COZAAR) 100 MG tablet, Take 1 tablet (100 mg total) by mouth daily., Disp: 90 tablet, Rfl: 3   metFORMIN (GLUCOPHAGE) 500 MG tablet, Take 1 tablet (500 mg total) by mouth daily with breakfast., Disp: 90 tablet, Rfl: 3    MEDICATION ADHERENCES TIPS AND STRATEGIES Taking medication as prescribed improves patient outcomes in heart failure (reduces hospitalizations, improves symptoms, increases survival) Side effects of medications can be managed by decreasing doses, switching agents, stopping drugs, or adding additional therapy. Please let someone in the [facility] know if you have having bothersome side effects so we can modify your regimen. Do not alter your medication regimen without talking to [place].  Medication reminders can help patients remember to take drugs on time. If you are missing or forgetting doses you can try linking behaviors, using pill boxes, or an electronic reminder like an alarm on your phone or an app. Some people can also get automated phone calls as medication reminders.

## 2021-11-04 NOTE — Progress Notes (Signed)
Patient ID: Noah Rodriguez, male    DOB: [birth year], 46 y.o.   MRN: [id number]  HPI  Noah Rodriguez is a 46 y/o male with a history of obstructive sleep apnea, obesity, hypokalemia, gout, asthma and chronic heart failure.   Echo report from 08/07/21 reviewed and showed an EF of 40%-45%. Echo done 08/25/16 reviewed and shows an EF of 60-65%. Echo done 02/14/16 and showed an EF of 35-40% along with mild Noah/TR.    Last cardiac catheterization was done 09/17/14 and showed an EF of 20-25% at that time. No CAD present.   Has not been admitted or been in the ED in the last 6 months.    He presents today for a follow-up visit with a chief complaint of minimal shortness of breath with moderate exertion. Describes this as chronic in nature although occurring intermittently. He has associated periods of apnea, pedal edema, diarrhea (with metformin) and chronic left knee pain. He denies any difficulty sleeping, abdominal distention, palpitations, chest pain, wheezing, cough, dizziness or fatigue.   Continues to work night shift and he hasn't taken any of his medications yet today but will do so upon his arrival home.   Currently drinking 32 ounces of soda and 96 ounces of water daily. Says that his mouth stays dry "all the time". He says that on the weekends when he's not working, he doesn't notice any swelling in his legs.   Past Medical History:  Diagnosis Date   Acute kidney injury (HCC)    Asthma    Chronic combined systolic and diastolic CHF (congestive heart failure) (HCC)    EF < 25% on Cath (09/17/2014)   Gout    Gout of big toe 09/16/2014   H/O medication noncompliance    Hypertension    Hypertensive heart disease    Hypokalemia    MI (myocardial infarction) [facility])    Patient denies having had a MI   NICM (nonischemic cardiomyopathy) (HCC)    a. cardiac cath 09/2014 with normal coronary arteries, EF 20-25% with severe global HK; b. echo 08/2014: EF 30-35%, false tendon in LV aepx of no clinical  sig, diffuse HK, GR1DD, aortic root 40 mm, trivial Noah, trivial pericardial effusion posterior    Obesity, Class III, BMI 40-49.9 (morbid obesity) (HCC)    Sleep apnea    a. sleep study 05/2010; b. noncompliant with CPAP   Ventral hernia    a. incarcerated omentum 06/10/2015   Past Surgical History:  Procedure Laterality Date   CARDIAC CATHETERIZATION N/A 09/17/2014   Procedure: Right/Left Heart Cath and Coronary Angiography;  Surgeon: Lennette Bihari, MD;  Location: [facility];  Service: Cardiovascular;  Laterality: N/A;   VENTRAL HERNIA REPAIR N/A 06/11/2015   Procedure: HERNIA REPAIR VENTRAL ADULT;  Surgeon: Lattie Haw, MD;  Location: [facility] ORS;  Service: General;  Laterality: N/A;   Family History  Problem Relation Age of Onset   HIV Mother    Hypertension Mother    Hypertension Other    Hypertension Maternal Grandmother    Hypertension Paternal Grandmother    Alcohol abuse Neg Hx    Cancer Neg Hx    Early death Neg Hx    Heart disease Neg Hx    Hyperlipidemia Neg Hx    Kidney disease Neg Hx    Stroke Neg Hx    Heart attack Neg Hx    Social History   Tobacco Use   Smoking status: Never   Smokeless tobacco: Never  Substance  Use Topics   Alcohol use: No   No Known Allergies  Prior to Admission medications   Medication Sig Start Date End Date Taking? Authorizing Provider  acetaminophen (TYLENOL) 650 MG CR tablet Take 650 mg by mouth every 8 (eight) hours as needed for pain.   Yes [provider]  albuterol (VENTOLIN HFA) 108 (90 Base) MCG/ACT inhaler Inhale 2 puffs into the lungs every 6 (six) hours as needed for wheezing or shortness of breath. 01/21/21  Yes Teodora Medici, DO  amLODipine (NORVASC) 10 MG tablet Take 1 tablet (10 mg total) by mouth daily. 03/27/21  Yes Darylene Price A, FNP  carvedilol (COREG) 25 MG tablet TAKE 1 TABLET BY MOUTH TWICE DAILY WITH MEALS 01/29/21  Yes Darylene Price A, FNP  furosemide (LASIX) 40 MG tablet Take 1 tablet by  mouth once daily 01/29/21  Yes Neils Siracusa A, FNP  ibuprofen (ADVIL) 200 MG tablet Take 600 mg by mouth every 6 (six) hours as needed for moderate pain or mild pain.   Yes [provider]  losartan (COZAAR) 100 MG tablet Take 1 tablet (100 mg total) by mouth daily. 05/29/21  Yes Alisa Graff, FNP  metFORMIN (GLUCOPHAGE) 500 MG tablet Take 1 tablet (500 mg total) by mouth daily with breakfast. 01/23/21  Yes Teodora Medici, DO  empagliflozin (JARDIANCE) 10 MG TABS tablet Take 1 tablet (10 mg total) by mouth daily before breakfast. Patient not taking: Reported on 10/09/2021 08/12/21   Alisa Graff, FNP    Review of Systems  Constitutional:  Negative for appetite change.  HENT:  Positive for postnasal drip. Negative for congestion and sore throat.   Eyes:  Negative for pain and visual disturbance.  Respiratory:  Positive for apnea and shortness of breath (minimal). Negative for cough, chest tightness and wheezing.   Cardiovascular:  Positive for leg swelling (minimal). Negative for chest pain and palpitations.  Gastrointestinal:  Positive for diarrhea. Negative for abdominal distention, constipation and nausea.  Endocrine: Negative.   Genitourinary:  Negative for difficulty urinating and frequency.  Musculoskeletal:  Positive for arthralgias (left knee). Negative for back pain and neck pain.  Skin: Negative.   Allergic/Immunologic: Negative.   Neurological:  Negative for dizziness, weakness, light-headedness, numbness and headaches.  Hematological:  Negative for adenopathy. Does not bruise/bleed easily.  Psychiatric/Behavioral:  Negative for dysphoric mood and sleep disturbance. The patient is not nervous/anxious.    Vitals:   11/04/21 0820  BP: (!) 185/105  Pulse: 85  Resp: 20  SpO2: 98%  Weight: (!) 371 lb (168.3 kg)  Height: 5\' 7"  (1.702 m)   Wt Readings from Last 3 Encounters:  11/04/21 (!) 371 lb (168.3 kg)  10/09/21 (!) 369 lb (167.4 kg)  08/12/21 (!) 375 lb  (170.1 kg)   Lab Results  Component Value Date   CREATININE 1.25 (H) 06/17/2021   CREATININE 1.22 12/17/2020   CREATININE 1.30 (H) 02/15/2019   Physical Exam Vitals and nursing note reviewed.  Constitutional:      Appearance: He is well-developed. He is obese.  HENT:     Head: Normocephalic and atraumatic.  Neck:     Vascular: No JVD.  Cardiovascular:     Rate and Rhythm: Normal rate and regular rhythm.  Pulmonary:     Effort: Pulmonary effort is normal.     Breath sounds: No wheezing or rales.  Abdominal:     General: There is no distension.     Palpations: Abdomen is soft.  Tenderness: There is no abdominal tenderness.  Musculoskeletal:        General: No tenderness.     Cervical back: Normal range of motion and neck supple.     Right lower leg: Edema (trace pitting) present.     Left lower leg: Edema (trace pitting) present.  Skin:    General: Skin is warm and dry.  Neurological:     General: No focal deficit present.     Mental Status: He is alert and oriented to person, place, and time.  Psychiatric:        Behavior: Behavior normal.        Thought Content: Thought content normal.     Assessment & Plan:  1: Chronic heart failure with mildly reduced ejection fraction- - NYHA class II - euvolemic - weighing daily; reminded to call for an overnight weight gain of >2 pounds or a weekly weight gain of >5 pounds - weight up 2 pounds from last visit here 1 month ago - says that he drinks a 32 ounce soda and 6 water bottles (16.9 ounces) daily; reviewed the importance of getting his daily intake down to 60-64 ounces daily; can use lollipop/ gum for his dry mouth - has been trying to walk more during the day - not adding any salt to his food & really trying to watch his sodium intake if he eats out - on GDMT of carvedilol and losartan  - samples provided of jardiance and PharmD will call his pharmacy to give them new insurance card to find out his copay - will check  BMP next visit since he was off his jardiance - plan to change his losartan either back to entresto (if covered) or to valsartan when he runs out of the losartan - discussed with patient benefits of wearing compression stockings daily - pro-BNP on 11/04/19 was 82  - PharmD reconciled medications with the patient  2: HTN- - BP elevated (185/105) but he hasn't taken his meds yet today and just got off work - instructed to move his amlodipine to the evening before going to work - had video visit with PCP Caralee Ates) 03/20/21 - BMP 06/17/21 reviewed and showed Na 138, K 3.8, GFR >60 and Scr 1.25  3: Sleep apnea- - NS for pulmonology 04/08/21; this needs to be r/s  4: DM- - A1c 06/17/21 was 6.2% - taking metformin now - will check A1c next visit - briefly discussed ozempic but need to make sure his pharmacy has his insurance card information and that this will be covered.   Medication bottles reviewed.    Return in 2 weeks sooner if needed.

## 2021-11-05 NOTE — Telephone Encounter (Signed)
Message left for Noah Rodriguez with update of PA process. Will continue to follow.

## 2021-11-05 NOTE — Telephone Encounter (Signed)
Insurance requesting prior authorization for [employer].  PA submitted via covermymeds. KeyKenny[employer] Arnold - PA Case ID: [id number]. Will continue to follow.

## 2021-11-17 NOTE — Telephone Encounter (Signed)
PA approved until 11/06/2022 Copay at [facility] for 30 day supply - $300.34 (qualify for co-pay card)  Can sent Rx to [facility] for co-pay assessment.

## 2021-11-18 ENCOUNTER — Encounter: Payer: Self-pay | Admitting: Pharmacist

## 2021-11-18 ENCOUNTER — Encounter: Payer: Self-pay | Admitting: Family

## 2021-11-18 ENCOUNTER — Other Ambulatory Visit (HOSPITAL_COMMUNITY): Payer: Self-pay

## 2021-11-18 ENCOUNTER — Other Ambulatory Visit
Admission: RE | Admit: 2021-11-18 | Discharge: 2021-11-18 | Disposition: A | Discharge status: Home / Self Care | Payer: 59 | Source: Ambulatory Visit | Attending: Family | Admitting: Family

## 2021-11-18 ENCOUNTER — Ambulatory Visit (HOSPITAL_BASED_OUTPATIENT_CLINIC_OR_DEPARTMENT_OTHER): Payer: 59 | Admitting: Family

## 2021-11-18 VITALS — BP 163/101 | HR 81 | Resp 20 | Ht 67.0 in | Wt 372.2 lb

## 2021-11-18 DIAGNOSIS — I5032 Chronic diastolic (congestive) heart failure: Secondary | ICD-10-CM | POA: Insufficient documentation

## 2021-11-18 DIAGNOSIS — G4733 Obstructive sleep apnea (adult) (pediatric): Secondary | ICD-10-CM | POA: Insufficient documentation

## 2021-11-18 DIAGNOSIS — J45909 Unspecified asthma, uncomplicated: Secondary | ICD-10-CM | POA: Insufficient documentation

## 2021-11-18 DIAGNOSIS — E119 Type 2 diabetes mellitus without complications: Secondary | ICD-10-CM | POA: Insufficient documentation

## 2021-11-18 DIAGNOSIS — I10 Essential (primary) hypertension: Secondary | ICD-10-CM | POA: Diagnosis not present

## 2021-11-18 DIAGNOSIS — I110 Hypertensive heart disease with heart failure: Secondary | ICD-10-CM | POA: Insufficient documentation

## 2021-11-18 DIAGNOSIS — G8929 Other chronic pain: Secondary | ICD-10-CM | POA: Insufficient documentation

## 2021-11-18 LAB — BASIC METABOLIC PANEL
Anion gap: 7 (ref 5–15)
BUN: 19 mg/dL (ref 6–20)
CO2: 29 mmol/L (ref 22–32)
Calcium: 9 mg/dL (ref 8.9–10.3)
Chloride: 105 mmol/L (ref 98–111)
Creatinine, Ser: 1.33 mg/dL — ABNORMAL HIGH (ref 0.61–1.24)
GFR, Estimated: >60 mL/min (ref >60)
Glucose, Bld: 104 mg/dL — ABNORMAL HIGH (ref 70–99)
Potassium: 4.2 mmol/L (ref 3.5–5.1)
Sodium: 141 mmol/L (ref 135–145)

## 2021-11-18 LAB — HEMOGLOBIN A1C
Hgb A1c MFr Bld: 6 % — ABNORMAL HIGH (ref 4.8–5.6)
Mean Plasma Glucose: 125.5 mg/dL

## 2021-11-18 MED ORDER — EMPAGLIFLOZIN 10 MG PO TABS: 10.0000 mg | ORAL_TABLET | Freq: Every day | ORAL | 3 refills | Status: DC

## 2021-11-18 MED ORDER — VALSARTAN 160 MG PO TABS: 160.0000 mg | ORAL_TABLET | Freq: Two times a day (BID) | ORAL | 3 refills | Status: DC

## 2021-11-18 MED ORDER — VALSARTAN 160 MG PO TABS: 160.0000 mg | ORAL_TABLET | Freq: Every day | ORAL | 3 refills | Status: DC

## 2021-11-18 NOTE — Progress Notes (Signed)
Patient ID: Noah Rodriguez, male    DOB: Mar 07, 1975, 46 y.o.   MRN: [id number]  HPI  Noah Rodriguez is a 46 y/o male with a history of obstructive sleep apnea, obesity, hypokalemia, gout, asthma and chronic heart failure.   Echo report from 08/07/21 reviewed and showed an EF of 40%-45%. Echo done 08/25/16 reviewed and shows an EF of 60-65%. Echo done 02/14/16 and showed an EF of 35-40% along with mild Noah/TR.    Last cardiac catheterization was done 09/17/14 and showed an EF of 20-25% at that time. No CAD present.   Has not been admitted or been in the ED in the last 6 months.    He presents today for a follow-up visit with a chief complaint of minimal shortness of breath with moderate exertion. Describes this as chronic in nature. He has associated fatigue, pedal edema, occasional diarrhea and chronic joint pain along with this. Denies any difficulty sleeping, dizziness, abdominal distention, palpitations, chest pain, wheezing, cough or weight gain.   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 Understands how to file for unemployment and has already started applying for other jobs. Voices concern about being out of work with the upcoming holidays.   Still working on finding out for sure how much the jardiance will cost him.   Drinking less fluids and says that he now drinks 4 bottles of water but that he also eats ice in between when his mouth gets dry.   Past Medical History:  Diagnosis Date   Acute kidney injury (HCC)    Asthma    Chronic combined systolic and diastolic CHF (congestive heart failure) (HCC)    EF < 25% on Cath (09/17/2014)   Gout    Gout of big toe 09/16/2014   H/O medication noncompliance    Hypertension    Hypertensive heart disease    Hypokalemia    MI (myocardial infarction) [facility])    Patient denies having had a MI   NICM (nonischemic cardiomyopathy) (HCC)    a. cardiac cath 09/2014 with normal coronary arteries, EF  20-25% with severe global HK; b. echo 08/2014: EF 30-35%, false tendon in LV aepx of no clinical sig, diffuse HK, GR1DD, aortic root 40 mm, trivial Noah, trivial pericardial effusion posterior    Obesity, Class III, BMI 40-49.9 (morbid obesity) (HCC)    Sleep apnea    a. sleep study 05/2010; b. noncompliant with CPAP   Ventral hernia    a. incarcerated omentum 06/10/2015   Past Surgical History:  Procedure Laterality Date   CARDIAC CATHETERIZATION N/A 09/17/2014   Procedure: Right/Left Heart Cath and Coronary Angiography;  Surgeon: Lennette Bihari, MD;  Location: [facility];  Service: Cardiovascular;  Laterality: N/A;   VENTRAL HERNIA REPAIR N/A 06/11/2015   Procedure: HERNIA REPAIR VENTRAL ADULT;  Surgeon: Lattie Haw, MD;  Location: [facility] ORS;  Service: General;  Laterality: N/A;   Family History  Problem Relation Age of Onset   HIV Mother    Hypertension Mother    Hypertension Other    Hypertension Maternal Grandmother    Hypertension Paternal Grandmother    Alcohol abuse Neg Hx    Cancer Neg Hx    Early death Neg Hx    Heart disease Neg Hx    Hyperlipidemia Neg Hx    Kidney disease Neg Hx    Stroke Neg Hx    Heart attack Neg Hx  Social History   Tobacco Use   Smoking status: Never   Smokeless tobacco: Never  Substance Use Topics   Alcohol use: No   No Known Allergies  Prior to Admission medications   Medication Sig Start Date End Date Taking? Authorizing Provider  amLODipine (NORVASC) 10 MG tablet Take 1 tablet (10 mg total) by mouth daily. 03/27/21  Yes Darylene Price A, FNP  carvedilol (COREG) 25 MG tablet TAKE 1 TABLET BY MOUTH TWICE DAILY WITH MEALS 01/29/21  Yes Darylene Price A, FNP  empagliflozin (JARDIANCE) 10 MG TABS tablet Take 1 tablet (10 mg total) by mouth daily before breakfast. 11/04/21  Yes Gollan, Kathlene November, MD  furosemide (LASIX) 40 MG tablet Take 1 tablet by mouth once daily 01/29/21  Yes Jacob Cicero A, FNP  losartan (COZAAR) 100 MG tablet Take 1  tablet (100 mg total) by mouth daily. 05/29/21  Yes Alisa Graff, FNP  metFORMIN (GLUCOPHAGE) 500 MG tablet Take 1 tablet (500 mg total) by mouth daily with breakfast. 01/23/21  Yes Teodora Medici, DO  albuterol (VENTOLIN HFA) 108 (90 Base) MCG/ACT inhaler Inhale 2 puffs into the lungs every 6 (six) hours as needed for wheezing or shortness of breath. Patient not taking: Reported on 11/18/2021 01/21/21   Teodora Medici, DO   Review of Systems  Constitutional:  Positive for fatigue. Negative for appetite change.  HENT:  Positive for postnasal drip. Negative for congestion and sore throat.   Eyes:  Negative for pain and visual disturbance.  Respiratory:  Positive for apnea and shortness of breath (minimal). Negative for cough, chest tightness and wheezing.   Cardiovascular:  Positive for leg swelling (minimal). Negative for chest pain and palpitations.  Gastrointestinal:  Positive for diarrhea. Negative for abdominal distention, constipation and nausea.  Endocrine: Negative.   Genitourinary:  Negative for difficulty urinating and frequency.  Musculoskeletal:  Positive for arthralgias (left knee). Negative for back pain and neck pain.  Skin: Negative.   Allergic/Immunologic: Negative.   Neurological:  Negative for dizziness, weakness, light-headedness and numbness.  Hematological:  Negative for adenopathy. Does not bruise/bleed easily.  Psychiatric/Behavioral:  Negative for dysphoric mood and sleep disturbance. The patient is not nervous/anxious.    Vitals:   11/18/21 0825  BP: (!) 163/101  Pulse: 81  Resp: 20  SpO2: 97%  Weight: (!) 372 lb 4 oz (168.9 kg)  Height: 5\' 7"  (1.702 m)   Wt Readings from Last 3 Encounters:  11/18/21 (!) 372 lb 4 oz (168.9 kg)  11/04/21 (!) 371 lb (168.3 kg)  10/09/21 (!) 369 lb (167.4 kg)   Lab Results  Component Value Date   CREATININE 1.33 (H) 11/18/2021   CREATININE 1.25 (H) 06/17/2021   CREATININE 1.22 12/17/2020   Physical Exam Vitals  and nursing note reviewed.  Constitutional:      Appearance: He is well-developed. He is obese.  HENT:     Head: Normocephalic and atraumatic.  Neck:     Vascular: No JVD.  Cardiovascular:     Rate and Rhythm: Normal rate and regular rhythm.  Pulmonary:     Effort: Pulmonary effort is normal.     Breath sounds: No wheezing or rales.  Abdominal:     General: There is no distension.     Palpations: Abdomen is soft.     Tenderness: There is no abdominal tenderness.  Musculoskeletal:        General: No tenderness.     Cervical back: Normal range of motion and neck supple.  Right lower leg: Edema (trace pitting) present.     Left lower leg: Edema (trace pitting) present.  Skin:    General: Skin is warm and dry.  Neurological:     General: No focal deficit present.     Mental Status: He is alert and oriented to person, place, and time.  Psychiatric:        Behavior: Behavior normal.        Thought Content: Thought content normal.     Assessment & Plan:  1: Chronic heart failure with mildly reduced ejection fraction- - NYHA class II - euvolemic - weighing daily; reminded to call for an overnight weight gain of >2 pounds or a weekly weight gain of >5 pounds - weight up 1.4 pounds from last visit here 2 weeks ago - now drinking 4 water bottles daily but is eating ice when his mouth gets dry; explained that he needed to count for the ice in his fluid intake but he's definitely drinking much less total amount - has been trying to walk more during the day - not adding any salt to his food & really trying to watch his sodium intake if he eats out - on GDMT of carvedilol, jardiance and losartan  - will stop losartan and begin valsartan 160mg  BID - check BMP today - samples provided of jardiance while affordability is getting looked into by pharmacy - once he loses his job and gets on employment, consider trying for patient assistance again for entresto/ jardiance if his government  insurance doesn't cover the meds - pro-BNP on 11/04/19 was 82  - PharmD reconciled medications with the patient  2: HTN- - BP elevated (163/101); changing ARB per above - had video visit with PCP 11/06/19) 03/20/21 - BMP 06/17/21 reviewed and showed Na 138, K 3.8, GFR >60 and Scr 1.25  3: Sleep apnea- - NS for pulmonology 04/08/21; this needs to be r/s  4: DM- - A1c 06/17/21 was 6.2% - will check A1c today - will hold off ozempic until we have his insurance information worked through   Medication bottles reviewed.    Return in 3 weeks, sooner if needed.

## 2021-11-18 NOTE — Progress Notes (Signed)
Patient ID: Noah Rodriguez, male   DOB: Jul 27, 1975, 46 y.o.   MRN: [id number] [facility] - HEART FAILURE CLINIC - PHARMACIST COUNSELING NOTE  Guideline-Directed Medical Therapy/Evidence Based Medicine  ACE/ARB/ARNI: Losartan 100 mg daily Beta Blocker: Carvedilol 25 mg twice daily Aldosterone Antagonist: None Diuretic: Furosemide 40 mg daily SGLT2i: empagliflozin 10mg  (out of med)  Adherence Assessment  Do you ever forget to take your medication? [] Yes [x] No  Do you ever skip doses due to side effects? [] Yes [x] No  Do you have trouble affording your medicines? [] Yes [x] No  Are you ever unable to pick up your medication due to transportation difficulties? [] Yes [x] No  Do you ever stop taking your medications because you don't believe they are helping? [] Yes [x] No  Do you check your weight daily? [] Yes [x] No   Adherence strategy: associates medications with time of day  Barriers to obtaining medications: none  Vital signs: HR 86, BP 158/97, weight (pounds) 374 lb ECHO: Date 08/14/2016, EF 60-65%, notes: normal systolic function; wall motion normal; no regional wall motion abnormalities; normal left ventricular function; normal study Cath: Date 09/17/14, EF 20-25%, notes: left ventricular dilatation with severe global hypo-contractility     Latest Ref Rng & Units 11/18/2021    9:28 AM 06/17/2021    9:09 AM 12/17/2020   10:23 AM  BMP  Glucose 70 - 99 mg/dL     BUN 6 - 20 mg/dL 19  25  18    Creatinine 0.61 - 1.24 mg/dL     Sodium 135 - 145 mmol/L 141  138  135   Potassium 3.5 - 5.1 mmol/L 4.2  3.8  3.7   Chloride 98 - 111 mmol/L 105  106  101   CO2 22 - 32 mmol/L 29  25  27    Calcium 8.9 - 10.3 mg/dL 9.0  8.9  8.7     Past Medical History:  Diagnosis Date   Acute kidney injury (HCC)    Asthma    Chronic combined systolic and diastolic CHF (congestive heart failure) (HCC)    EF < 25% on Cath (09/17/2014)   Gout    Gout of big  toe 09/16/2014   H/O medication noncompliance    Hypertension    Hypertensive heart disease    Hypokalemia    MI (myocardial infarction) Noah Rodriguez)    Patient denies having had a MI   NICM (nonischemic cardiomyopathy) (HCC)    a. cardiac cath 09/2014 with normal coronary arteries, EF 20-25% with severe global HK; b. echo 08/2014: EF 30-35%, false tendon in LV aepx of no clinical sig, diffuse HK, GR1DD, aortic root 40 mm, trivial MR, trivial pericardial effusion posterior    Obesity, Class III, BMI 40-49.9 (morbid obesity) (HCC)    Sleep apnea    a. sleep study 05/2010; b. noncompliant with CPAP   Ventral hernia    a. incarcerated omentum 06/10/2015    ASSESSMENT 46 year old male who presents to the HF clinic for follow up. PMH relevant to sleep apnea, obesity, gout, asthma, CHF. In 2016, cath showed EF of 20-25%, then in 2018 ECHO with EF 60-65% and essentially normal study. No other ECHO's since this date due to cost and uninsured status. Patient works nightshift, and takes medications when arrives home and evening medications before leaving for work. Continued to have challenges with diet and positive lifestyle modification.  Medication reconciliation was completed today during appointment. Patient denies issues with medication but lifestyle and diet  compliance remain challenging. He will loss his job on Dec Nov/30, but should be able to retain current self-pay insurance.  PLAN Continue Jardiance 10mg  daily - working with pharmacy and insurance to determine cost and affordability. Do not refill losartan prior to next follow up. Plan to change to valsartan or Entresto to improve BP control. Counseled on diet including recommendation of < 2 grams/day of sodium, and low cholesterol protein (chicken as opposed to steak)  Time spent: 20 minutes  Noah Rodriguez PharmD, BCPS 11/18/2021 12:14 PM   Current Outpatient Medications:    albuterol (VENTOLIN HFA) 108 (90 Base) MCG/ACT inhaler,  Inhale 2 puffs into the lungs every 6 (six) hours as needed for wheezing or shortness of breath. (Patient not taking: Reported on 11/18/2021), Disp: 8 g, Rfl: 0   amLODipine (NORVASC) 10 MG tablet, Take 1 tablet (10 mg total) by mouth daily., Disp: 90 tablet, Rfl: 3   carvedilol (COREG) 25 MG tablet, TAKE 1 TABLET BY MOUTH TWICE DAILY WITH MEALS, Disp: 180 tablet, Rfl: 3   empagliflozin (JARDIANCE) 10 MG TABS tablet, Take 1 tablet (10 mg total) by mouth daily before breakfast., Disp: 30 tablet, Rfl: 3   furosemide (LASIX) 40 MG tablet, Take 1 tablet by mouth once daily, Disp: 90 tablet, Rfl: 3   metFORMIN (GLUCOPHAGE) 500 MG tablet, Take 1 tablet (500 mg total) by mouth daily with breakfast., Disp: 90 tablet, Rfl: 3   valsartan (DIOVAN) 160 MG tablet, Take 1 tablet (160 mg total) by mouth 2 (two) times daily., Disp: 60 tablet, Rfl: 3    MEDICATION ADHERENCES TIPS AND STRATEGIES Taking medication as prescribed improves patient outcomes in heart failure (reduces hospitalizations, improves symptoms, increases survival) Side effects of medications can be managed by decreasing doses, switching agents, stopping drugs, or adding additional therapy. Please let someone in the Heart Failure Clinic know if you have having bothersome side effects so we can modify your regimen. Do not alter your medication regimen without talking to 13/08/2021.  Medication reminders can help patients remember to take drugs on time. If you are missing or forgetting doses you can try linking behaviors, using pill boxes, or an electronic reminder like an alarm on your phone or an app. Some people can also get automated phone calls as medication reminders.

## 2021-11-18 NOTE — Patient Instructions (Addendum)
Medication Changes:  STOP taking losartan START taking valsartan   Lab Work:  Labs done today, your results will be available in MyChart, we will contact you for abnormal readings.   Testing/Procedures:  None   Referrals:  None  Special Instructions // Education:  Do the following things EVERYDAY: Weigh yourself in the morning before breakfast. Write it down and keep it in a log. Take your medicines as prescribed Eat low salt foods--Limit salt (sodium) to 2000 mg per day.  Stay as active as you can everyday Limit all fluids for the day to less than 2 liters   Follow-Up in:   3 weeks    If you have any questions or concerns before your next appointment please send [place] a message through mychart or call our office at [phone]    If you have voicemail, please make sure your mailbox is cleaned out so that we may leave a message and please make sure to listen to any voicemails.

## 2021-12-01 ENCOUNTER — Telehealth: Payer: Self-pay | Admitting: Pharmacist

## 2021-12-01 NOTE — Telephone Encounter (Signed)
[facility] sent [facility] (close to pt's home). [facility] notified [facility] has Rx, prior-authorization approved and co-pay card information. Mr Sliger will follow up with cost and let us know if able to afford.

## 2021-12-08 NOTE — Progress Notes (Deleted)
Patient ID: Noah Rodriguez, male    DOB: February 04, 1975, 46 y.o.   MRN: [id number]  HPI  Mr Livolsi is a 46 y/o male with a history of obstructive sleep apnea, obesity, hypokalemia, gout, asthma and chronic heart failure.   Echo report from 08/07/21 reviewed and showed an EF of 40%-45%. Echo done 08/25/16 reviewed and shows an EF of 60-65%. Echo done 02/14/16 and showed an EF of 35-40% along with mild MR/TR.    Last cardiac catheterization was done 09/17/14 and showed an EF of 20-25% at that time. No CAD present.   Has not been admitted or been in the ED in the last 6 months.    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 Understands how to file for unemployment and has already started applying for other jobs. Voices concern about being out of work with the upcoming holidays.     Past Medical History:  Diagnosis Date   Acute kidney injury (HCC)    Asthma    Chronic combined systolic and diastolic CHF (congestive heart failure) (HCC)    EF < 25% on Cath (09/17/2014)   Gout    Gout of big toe 09/16/2014   H/O medication noncompliance    Hypertension    Hypertensive heart disease    Hypokalemia    MI (myocardial infarction) [facility])    Patient denies having had a MI   NICM (nonischemic cardiomyopathy) (HCC)    a. cardiac cath 09/2014 with normal coronary arteries, EF 20-25% with severe global HK; b. echo 08/2014: EF 30-35%, false tendon in LV aepx of no clinical sig, diffuse HK, GR1DD, aortic root 40 mm, trivial MR, trivial pericardial effusion posterior    Obesity, Class III, BMI 40-49.9 (morbid obesity) (HCC)    Sleep apnea    a. sleep study 05/2010; b. noncompliant with CPAP   Ventral hernia    a. incarcerated omentum 06/10/2015   Past Surgical History:  Procedure Laterality Date   CARDIAC CATHETERIZATION N/A 09/17/2014   Procedure: Right/Left Heart Cath and Coronary  Angiography;  Surgeon: Lennette Bihari, MD;  Location: [facility];  Service: Cardiovascular;  Laterality: N/A;   VENTRAL HERNIA REPAIR N/A 06/11/2015   Procedure: HERNIA REPAIR VENTRAL ADULT;  Surgeon: Lattie Haw, MD;  Location: [facility] ORS;  Service: General;  Laterality: N/A;   Family History  Problem Relation Age of Onset   HIV Mother    Hypertension Mother    Hypertension Other    Hypertension Maternal Grandmother    Hypertension Paternal Grandmother    Alcohol abuse Neg Hx    Cancer Neg Hx    Early death Neg Hx    Heart disease Neg Hx    Hyperlipidemia Neg Hx    Kidney disease Neg Hx    Stroke Neg Hx    Heart attack Neg Hx    Social History   Tobacco Use   Smoking status: Never   Smokeless tobacco: Never  Substance Use Topics   Alcohol use: No   No Known Allergies   Review of Systems  Constitutional:  Positive for fatigue. Negative for appetite change.  HENT:  Positive for postnasal drip. Negative for congestion and sore throat.   Eyes:  Negative for pain and visual disturbance.  Respiratory:  Positive for apnea and shortness of breath (minimal). Negative for cough,  chest tightness and wheezing.   Cardiovascular:  Positive for leg swelling (minimal). Negative for chest pain and palpitations.  Gastrointestinal:  Positive for diarrhea. Negative for abdominal distention, constipation and nausea.  Endocrine: Negative.   Genitourinary:  Negative for difficulty urinating and frequency.  Musculoskeletal:  Positive for arthralgias (left knee). Negative for back pain and neck pain.  Skin: Negative.   Allergic/Immunologic: Negative.   Neurological:  Negative for dizziness, weakness, light-headedness and numbness.  Hematological:  Negative for adenopathy. Does not bruise/bleed easily.  Psychiatric/Behavioral:  Negative for dysphoric mood and sleep disturbance. The patient is not nervous/anxious.     Physical Exam Vitals and nursing note reviewed.  Constitutional:       Appearance: He is well-developed. He is obese.  HENT:     Head: Normocephalic and atraumatic.  Neck:     Vascular: No JVD.  Cardiovascular:     Rate and Rhythm: Normal rate and regular rhythm.  Pulmonary:     Effort: Pulmonary effort is normal.     Breath sounds: No wheezing or rales.  Abdominal:     General: There is no distension.     Palpations: Abdomen is soft.     Tenderness: There is no abdominal tenderness.  Musculoskeletal:        General: No tenderness.     Cervical back: Normal range of motion and neck supple.     Right lower leg: Edema (trace pitting) present.     Left lower leg: Edema (trace pitting) present.  Skin:    General: Skin is warm and dry.  Neurological:     General: No focal deficit present.     Mental Status: He is alert and oriented to person, place, and time.  Psychiatric:        Behavior: Behavior normal.        Thought Content: Thought content normal.     Assessment & Plan:  1: Chronic heart failure with mildly reduced ejection fraction- - NYHA class II - euvolemic - weighing daily; reminded to call for an overnight weight gain of >2 pounds or a weekly weight gain of >5 pounds - weight 372.4 pounds from last visit here 3 weeks ago - now drinking 4 water bottles daily but is eating ice when his mouth gets dry; explained that he needed to count for the ice in his fluid intake but he's definitely drinking much less total amount - has been trying to walk more during the day - not adding any salt to his food & really trying to watch his sodium intake if he eats out - on GDMT of carvedilol, jardiance and valsartan - ARB changed to valsartan at last visi - check BMP today - once he loses his job and gets on employment, consider trying for patient assistance again for entresto/ jardiance if his government insurance doesn't cover the meds - pro-BNP on 11/04/19 was 82  - PharmD reconciled medications with the patient  2: HTN- - BP  - had video  visit with PCP Caralee Ates) 03/20/21 - BMP 11/18/21 reviewed and showed Na 141, K 4.2, GFR >60 and Scr 1.33  3: Sleep apnea- - NS for pulmonology 04/08/21; this needs to be r/s  4: DM- - A1c 11/18/21 was 6.0% - will hold off ozempic until we have his insurance information worked through   Medication bottles reviewed.

## 2021-12-09 ENCOUNTER — Ambulatory Visit: Payer: 59 | Admitting: Family

## 2021-12-18 ENCOUNTER — Ambulatory Visit: Payer: 59 | Admitting: Family

## 2021-12-22 ENCOUNTER — Ambulatory Visit: Payer: 59 | Attending: Family | Admitting: Family

## 2021-12-22 ENCOUNTER — Encounter: Payer: Self-pay | Admitting: Family

## 2021-12-22 ENCOUNTER — Other Ambulatory Visit
Admission: RE | Admit: 2021-12-22 | Discharge: 2021-12-22 | Disposition: A | Discharge status: Home / Self Care | Payer: 59 | Source: Ambulatory Visit | Attending: Family | Admitting: Family

## 2021-12-22 VITALS — BP 120/68 | HR 76 | Resp 18 | Wt 366.5 lb

## 2021-12-22 DIAGNOSIS — G4733 Obstructive sleep apnea (adult) (pediatric): Secondary | ICD-10-CM | POA: Insufficient documentation

## 2021-12-22 DIAGNOSIS — M79606 Pain in leg, unspecified: Secondary | ICD-10-CM | POA: Diagnosis not present

## 2021-12-22 DIAGNOSIS — E119 Type 2 diabetes mellitus without complications: Secondary | ICD-10-CM | POA: Diagnosis not present

## 2021-12-22 DIAGNOSIS — I5032 Chronic diastolic (congestive) heart failure: Secondary | ICD-10-CM | POA: Diagnosis not present

## 2021-12-22 DIAGNOSIS — R0602 Shortness of breath: Secondary | ICD-10-CM | POA: Diagnosis present

## 2021-12-22 DIAGNOSIS — Z794 Long term (current) use of insulin: Secondary | ICD-10-CM

## 2021-12-22 DIAGNOSIS — Z79899 Other long term (current) drug therapy: Secondary | ICD-10-CM | POA: Diagnosis not present

## 2021-12-22 DIAGNOSIS — I110 Hypertensive heart disease with heart failure: Secondary | ICD-10-CM | POA: Insufficient documentation

## 2021-12-22 DIAGNOSIS — Z6841 Body Mass Index (BMI) 40.0 and over, adult: Secondary | ICD-10-CM | POA: Insufficient documentation

## 2021-12-22 DIAGNOSIS — J45909 Unspecified asthma, uncomplicated: Secondary | ICD-10-CM | POA: Diagnosis not present

## 2021-12-22 DIAGNOSIS — Z7984 Long term (current) use of oral hypoglycemic drugs: Secondary | ICD-10-CM | POA: Insufficient documentation

## 2021-12-22 DIAGNOSIS — I10 Essential (primary) hypertension: Secondary | ICD-10-CM | POA: Diagnosis not present

## 2021-12-22 LAB — BASIC METABOLIC PANEL
Anion gap: 9 (ref 5–15)
BUN: 24 mg/dL — ABNORMAL HIGH (ref 6–20)
CO2: 27 mmol/L (ref 22–32)
Calcium: 8.7 mg/dL — ABNORMAL LOW (ref 8.9–10.3)
Chloride: 103 mmol/L (ref 98–111)
Creatinine, Ser: 1.47 mg/dL — ABNORMAL HIGH (ref 0.61–1.24)
GFR, Estimated: 59 mL/min — ABNORMAL LOW (ref >60)
Glucose, Bld: 102 mg/dL — ABNORMAL HIGH (ref 70–99)
Potassium: 3.6 mmol/L (ref 3.5–5.1)
Sodium: 139 mmol/L (ref 135–145)

## 2021-12-22 NOTE — Progress Notes (Signed)
Patient ID: Noah Rodriguez, male    DOB: February 01, 1975, 46 y.o.   MRN: [id number]  HPI  Noah Rodriguez is a 46 y/o male with a history of obstructive sleep apnea, obesity, hypokalemia, gout, asthma and chronic heart failure.   Echo report from 08/07/21 reviewed and showed an EF of 40%-45%. Echo done 08/25/16 reviewed and shows an EF of 60-65%. Echo done 02/14/16 and showed an EF of 35-40% along with mild Noah/TR.    Last cardiac catheterization was done 09/17/14 and showed an EF of 20-25% at that time. No CAD present.   Has not been admitted or been in the ED in the last 6 months.    He presents today for a follow-up visit with a chief complaint of minimal shortness of breath with moderate exertion. Describes this as chronic in nature. He has associated fatigue, pedal edema & leg pain along with this. He denies any difficulty sleeping, dizziness, abdominal distention, palpitations, chest pain, wheezing, cough or weight gain.   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 Admits that he's still drinking too much fluids.  Past Medical History:  Diagnosis Date   Acute kidney injury (HCC)    Asthma    Chronic combined systolic and diastolic CHF (congestive heart failure) (HCC)    EF < 25% on Cath (09/17/2014)   Gout    Gout of big toe 09/16/2014   H/O medication noncompliance    Hypertension    Hypertensive heart disease    Hypokalemia    MI (myocardial infarction) [facility])    Patient denies having had a MI   NICM (nonischemic cardiomyopathy) (HCC)    a. cardiac cath 09/2014 with normal coronary arteries, EF 20-25% with severe global HK; b. echo 08/2014: EF 30-35%, false tendon in LV aepx of no clinical sig, diffuse HK, GR1DD, aortic root 40 mm, trivial Noah, trivial pericardial effusion posterior    Obesity, Class III, BMI 40-49.9 (morbid obesity) (HCC)    Sleep apnea    a. sleep study 05/2010; b. noncompliant with CPAP    Ventral hernia    a. incarcerated omentum 06/10/2015   Past Surgical History:  Procedure Laterality Date   CARDIAC CATHETERIZATION N/A 09/17/2014   Procedure: Right/Left Heart Cath and Coronary Angiography;  Surgeon: Lennette Bihari, MD;  Location: [facility];  Service: Cardiovascular;  Laterality: N/A;   VENTRAL HERNIA REPAIR N/A 06/11/2015   Procedure: HERNIA REPAIR VENTRAL ADULT;  Surgeon: Lattie Haw, MD;  Location: [facility] ORS;  Service: General;  Laterality: N/A;   Family History  Problem Relation Age of Onset   HIV Mother    Hypertension Mother    Hypertension Other    Hypertension Maternal Grandmother    Hypertension Paternal Grandmother    Alcohol abuse Neg Hx    Cancer Neg Hx    Early death Neg Hx    Heart disease Neg Hx    Hyperlipidemia Neg Hx    Kidney disease Neg Hx    Stroke Neg Hx    Heart attack Neg Hx    Social History   Tobacco Use   Smoking status: Never   Smokeless tobacco: Never  Substance Use Topics   Alcohol use: No   No Known Allergies  Prior to Admission medications   Medication Sig Start Date End Date Taking? Authorizing Provider  albuterol (VENTOLIN HFA) 108 (90 Base) MCG/ACT inhaler Inhale 2 puffs into  the lungs every 6 (six) hours as needed for wheezing or shortness of breath. 01/21/21  Yes Margarita Mail, DO  amLODipine (NORVASC) 10 MG tablet Take 1 tablet (10 mg total) by mouth daily. 03/27/21  Yes Clarisa Kindred A, FNP  carvedilol (COREG) 25 MG tablet TAKE 1 TABLET BY MOUTH TWICE DAILY WITH MEALS 01/29/21  Yes Clarisa Kindred A, FNP  empagliflozin (JARDIANCE) 10 MG TABS tablet Take 1 tablet (10 mg total) by mouth daily before breakfast. 11/18/21  Yes Gollan, Tollie Pizza, MD  furosemide (LASIX) 40 MG tablet Take 1 tablet by mouth once daily 01/29/21  Yes Clarisa Kindred A, FNP  metFORMIN (GLUCOPHAGE) 500 MG tablet Take 1 tablet (500 mg total) by mouth daily with breakfast. 01/23/21  Yes Margarita Mail, DO  valsartan (DIOVAN) 160 MG tablet  Take 1 tablet (160 mg total) by mouth 2 (two) times daily. 11/18/21  Yes Delma Freeze, FNP    Review of Systems  Constitutional:  Positive for fatigue. Negative for appetite change.  HENT:  Positive for postnasal drip. Negative for congestion and sore throat.   Eyes:  Negative for pain and visual disturbance.  Respiratory:  Positive for apnea and shortness of breath (minimal). Negative for cough, chest tightness and wheezing.   Cardiovascular:  Positive for leg swelling (minimal). Negative for chest pain and palpitations.  Gastrointestinal:  Negative for abdominal distention, constipation, diarrhea and nausea.  Endocrine: Negative.   Genitourinary:  Negative for difficulty urinating and frequency.  Musculoskeletal:  Positive for arthralgias (left knee). Negative for back pain and neck pain.  Skin: Negative.   Allergic/Immunologic: Negative.   Neurological:  Negative for dizziness, weakness, light-headedness and numbness.  Hematological:  Negative for adenopathy. Does not bruise/bleed easily.  Psychiatric/Behavioral:  Negative for dysphoric mood and sleep disturbance. The patient is not nervous/anxious.    Vitals:   12/22/21 1405 12/22/21 1425  BP: (!) 169/86 120/68  Pulse: 76   Resp: 18   SpO2: 97%   Weight: (!) 366 lb 8 oz (166.2 kg)    Wt Readings from Last 3 Encounters:  12/22/21 (!) 366 lb 8 oz (166.2 kg)  11/18/21 (!) 372 lb 4 oz (168.9 kg)  11/04/21 (!) 371 lb (168.3 kg)   Lab Results  Component Value Date   CREATININE 1.33 (H) 11/18/2021   CREATININE 1.25 (H) 06/17/2021   CREATININE 1.22 12/17/2020   Physical Exam Vitals and nursing note reviewed.  Constitutional:      Appearance: He is well-developed. He is obese.  HENT:     Head: Normocephalic and atraumatic.  Neck:     Vascular: No JVD.  Cardiovascular:     Rate and Rhythm: Normal rate and regular rhythm.  Pulmonary:     Effort: Pulmonary effort is normal.     Breath sounds: No wheezing or rales.   Abdominal:     General: There is no distension.     Palpations: Abdomen is soft.     Tenderness: There is no abdominal tenderness.  Musculoskeletal:        General: No tenderness.     Cervical back: Normal range of motion and neck supple.     Right lower leg: Edema (trace pitting) present.     Left lower leg: Edema (trace pitting) present.  Skin:    General: Skin is warm and dry.  Neurological:     General: No focal deficit present.     Mental Status: He is alert and oriented to person, place, and time.  Psychiatric:        Behavior: Behavior normal.        Thought Content: Thought content normal.     Assessment & Plan:  1: Chronic heart failure with mildly reduced ejection fraction- - NYHA class II - euvolemic - weighing daily; reminded to call for an overnight weight gain of >2 pounds or a weekly weight gain of >5 pounds - weight down 6 pounds from last visit here 3 weeks ago - continues to drink too much fluids but says that his mouth stays dry "all the time"; encouraged to try using gum etc - not adding any salt to his food & really trying to watch his sodium intake if he eats out - on GDMT of carvedilol, jardiance and valsartan - ARB changed to valsartan at last visit and BP much better - check BMP today - once he loses his job and gets on employment, consider trying for patient assistance again for entresto/ jardiance if his government insurance doesn't cover the meds - pro-BNP on 11/04/19 was 82   2: HTN- - BP elevated initially but incorrect cuff size used; manual recheck shows 120/68 - had video visit with PCP Caralee Ates) 03/20/21 - BMP 11/18/21 reviewed and showed Na 141, K 4.2, GFR >60 and Scr 1.33  3: Sleep apnea- - NS for pulmonology 04/08/21; this needs to be r/s  4: DM- - A1c 11/18/21 was 6.0% - will hold off ozempic until we have his insurance information worked through   Medication bottles reviewed.    Return in 1 month, sooner if needed.

## 2021-12-22 NOTE — Patient Instructions (Signed)
Continue weighing daily and call for an overnight weight gain of 3 pounds or more or a weekly weight gain of more than 5 pounds.   If you have voicemail, please make sure your mailbox is cleaned out so that we may leave a message and please make sure to listen to any voicemails.     

## 2022-01-21 ENCOUNTER — Encounter: Payer: 59 | Admitting: Family

## 2022-01-25 NOTE — Progress Notes (Unsigned)
Patient ID: Noah Rodriguez, male    DOB: 1975-07-14, 47 y.o.   MRN: [id number]  HPI  Mr Weider is a 47 y/o male with a history of obstructive sleep apnea, obesity, hypokalemia, gout, asthma and chronic heart failure.   Echo report from 08/07/21 reviewed and showed an EF of 40%-45%. Echo done 08/25/16 reviewed and shows an EF of 60-65%. Echo done 02/14/16 and showed an EF of 35-40% along with mild MR/TR.    Last cardiac catheterization was done 09/17/14 and showed an EF of 20-25% at that time. No CAD present.   Has not been admitted or been in the ED in the last 6 months.    He presents today for a follow-up visit with a chief complaint of minimal SOB w/ exertion. Describes this as chronic in nature. Has associated fatigue, cough & leg pain (improving) along with this. Denies any difficulty sleeping, dizziness, abdominal distention, palpitations, pedal edema, chest pain or weight gain.   Currently unemployed. Has only been taking his meds daily because he has felt so good. Valsartan and carvedilol should be BID.   Does admit to getting more exercise since being unemployed. Says that he walked up 6 flights of stairs the other day and was SOB but recovered quickly.   Past Medical History:  Diagnosis Date   Acute kidney injury ([place])    Asthma    Chronic combined systolic and diastolic CHF (congestive heart failure) (HCC)    EF < 25% on Cath (09/17/2014)   Gout    Gout of big toe 09/16/2014   H/O medication noncompliance    Hypertension    Hypertensive heart disease    Hypokalemia    MI (myocardial infarction) [facility])    Patient denies having had a MI   NICM (nonischemic cardiomyopathy) ([place])    a. cardiac cath 09/2014 with normal coronary arteries, EF 20-25% with severe global HK; b. echo 08/2014: EF 30-35%, false tendon in LV aepx of no clinical sig, diffuse HK, GR1DD, aortic root 40 mm, trivial MR, trivial pericardial effusion posterior    Obesity, Class III, BMI 40-49.9 (morbid obesity)  ([place])    Sleep apnea    a. sleep study 05/2010; b. noncompliant with CPAP   Ventral hernia    a. incarcerated omentum 06/10/2015   Past Surgical History:  Procedure Laterality Date   CARDIAC CATHETERIZATION N/A 09/17/2014   Procedure: Right/Left Heart Cath and Coronary Angiography;  Surgeon: Troy Sine, MD;  Location: [facility];  Service: Cardiovascular;  Laterality: N/A;   VENTRAL HERNIA REPAIR N/A 06/11/2015   Procedure: HERNIA REPAIR VENTRAL ADULT;  Surgeon: Florene Glen, MD;  Location: [facility];  Service: General;  Laterality: N/A;   Family History  Problem Relation Age of Onset   HIV Mother    Hypertension Mother    Hypertension Other    Hypertension Maternal Grandmother    Hypertension Paternal Grandmother    Alcohol abuse Neg Hx    Cancer Neg Hx    Early death Neg Hx    Heart disease Neg Hx    Hyperlipidemia Neg Hx    Kidney disease Neg Hx    Stroke Neg Hx    Heart attack Neg Hx    Social History   Tobacco Use   Smoking status: Never   Smokeless tobacco: Never  Substance Use Topics   Alcohol use: No   No Known Allergies  Prior to Admission medications   Medication Sig Start Date End Date  Taking? Authorizing Provider  acetaminophen (TYLENOL) 650 MG CR tablet Take 650 mg by mouth every 8 (eight) hours as needed for pain.   Yes [provider]  albuterol (VENTOLIN HFA) 108 (90 Base) MCG/ACT inhaler Inhale 2 puffs into the lungs every 6 (six) hours as needed for wheezing or shortness of breath. 01/21/21  Yes Margarita Mail, DO  amLODipine (NORVASC) 10 MG tablet Take 1 tablet (10 mg total) by mouth daily. 03/27/21  Yes Asianna Brundage, Inetta Fermo A, FNP  carvedilol (COREG) 25 MG tablet TAKE 1 TABLET BY MOUTH TWICE DAILY WITH MEALS Patient taking differently: Take 25 mg by mouth 2 (two) times daily with a meal. 01/29/21  Yes Clarisa Kindred A, FNP  furosemide (LASIX) 40 MG tablet Take 1 tablet by mouth once daily Patient taking differently: Take 40 mg by mouth  daily. 01/29/21  Yes Delma Freeze, FNP  metFORMIN (GLUCOPHAGE) 500 MG tablet Take 1 tablet (500 mg total) by mouth daily with breakfast. 01/23/21  Yes Margarita Mail, DO  valsartan (DIOVAN) 160 MG tablet Take 1 tablet (160 mg total) by mouth 2 (two) times daily. 11/18/21  Yes Sophia Cubero, Inetta Fermo A, FNP  empagliflozin (JARDIANCE) 10 MG TABS tablet Take 1 tablet (10 mg total) by mouth daily before breakfast. Patient not taking: Reported on 01/26/2022 11/18/21   Antonieta Iba, MD   Review of Systems  Constitutional:  Positive for fatigue. Negative for appetite change.  HENT:  Positive for postnasal drip. Negative for congestion and sore throat.   Eyes:  Negative for pain and visual disturbance.  Respiratory:  Positive for apnea, cough and shortness of breath (minimal). Negative for chest tightness and wheezing.   Cardiovascular:  Negative for chest pain, palpitations and leg swelling.  Gastrointestinal:  Negative for abdominal distention, constipation, diarrhea and nausea.  Endocrine: Negative.   Genitourinary:  Negative for difficulty urinating and frequency.  Musculoskeletal:  Positive for arthralgias (left knee). Negative for back pain and neck pain.  Skin: Negative.   Allergic/Immunologic: Negative.   Neurological:  Negative for dizziness, weakness, light-headedness and numbness.  Hematological:  Negative for adenopathy. Does not bruise/bleed easily.  Psychiatric/Behavioral:  Negative for dysphoric mood and sleep disturbance. The patient is not nervous/anxious.    Vitals:   01/26/22 0818 01/26/22 0849  BP: (!) 192/102 (!) 160/102  Pulse: 77   Resp: 20   SpO2: 99%   Weight: (!) 358 lb 8 oz (162.6 kg)    Wt Readings from Last 3 Encounters:  01/26/22 (!) 358 lb 8 oz (162.6 kg)  12/22/21 (!) 366 lb 8 oz (166.2 kg)  11/18/21 (!) 372 lb 4 oz (168.9 kg)   Lab Results  Component Value Date   CREATININE 1.47 (H) 12/22/2021   CREATININE 1.33 (H) 11/18/2021   CREATININE 1.25 (H)  06/17/2021   Physical Exam Vitals and nursing note reviewed.  Constitutional:      Appearance: He is well-developed. He is obese.  HENT:     Head: Normocephalic and atraumatic.  Neck:     Vascular: No JVD.  Cardiovascular:     Rate and Rhythm: Normal rate and regular rhythm.  Pulmonary:     Effort: Pulmonary effort is normal.     Breath sounds: No wheezing or rales.  Abdominal:     General: There is no distension.     Palpations: Abdomen is soft.     Tenderness: There is no abdominal tenderness.  Musculoskeletal:        General: No tenderness.  Cervical back: Normal range of motion and neck supple.     Right lower leg: Edema (trace pitting) present.     Left lower leg: Edema (trace pitting) present.  Skin:    General: Skin is warm and dry.  Neurological:     General: No focal deficit present.     Mental Status: He is alert and oriented to person, place, and time.  Psychiatric:        Behavior: Behavior normal.        Thought Content: Thought content normal.     Assessment & Plan:  1: Chronic heart failure with mildly reduced ejection fraction- - NYHA class II - euvolemic - weighing daily; reminded to call for an overnight weight gain of >2 pounds or a weekly weight gain of >5 pounds - weight down 8 pounds from last visit here 1 month ago - walking more since he's been unemployed - drinking 1 pepsi and 3 bottles water daily - not adding any salt to his food & really trying to watch his sodium intake if he eats out - on GDMT of carvedilol, jardiance and valsartan - been out of jardiance and he can't afford it; 1 month samples given - emphasized that he take the carvedilol and valsartan BID - discussed adding spiro but he wants to wait - jardiance and entresto patient assistance forms filled out today; if approved, will stop his valsartan and put him back on entresto - pro-BNP on 11/04/19 was 82  - PharmD reconciled meds w/ patient  2: HTN- - BP 192/102,  160/102 - has been taking carvedilol and valsartan daily for the last 2 weeks; emphasized taking them BID and reviewed how his BP looked last time (120/68) - had video visit with PCP Rosana Berger) 03/20/21 - BMP 12/22/21 reviewed and showed Na 139, K 3.6, GFR 59 and Scr 1.47  3: Sleep apnea- - NS for pulmonology 04/08/21; this needs to be r/s  4: DM- - A1c 11/18/21 was 6.0% - will hold off ozempic until we have his insurance information worked through   Medication bottles reviewed.    Return in 1 month, sooner if needed. Will get BMP at that time

## 2022-01-26 ENCOUNTER — Ambulatory Visit: Payer: 59 | Attending: Family | Admitting: Family

## 2022-01-26 ENCOUNTER — Encounter: Payer: Self-pay | Admitting: Family

## 2022-01-26 ENCOUNTER — Encounter: Payer: Self-pay | Admitting: Pharmacy Technician

## 2022-01-26 VITALS — BP 160/102 | HR 77 | Resp 20 | Wt 358.5 lb

## 2022-01-26 DIAGNOSIS — J45909 Unspecified asthma, uncomplicated: Secondary | ICD-10-CM | POA: Insufficient documentation

## 2022-01-26 DIAGNOSIS — I10 Essential (primary) hypertension: Secondary | ICD-10-CM | POA: Diagnosis not present

## 2022-01-26 DIAGNOSIS — E119 Type 2 diabetes mellitus without complications: Secondary | ICD-10-CM | POA: Diagnosis not present

## 2022-01-26 DIAGNOSIS — R0602 Shortness of breath: Secondary | ICD-10-CM | POA: Insufficient documentation

## 2022-01-26 DIAGNOSIS — I110 Hypertensive heart disease with heart failure: Secondary | ICD-10-CM | POA: Diagnosis not present

## 2022-01-26 DIAGNOSIS — Z560 Unemployment, unspecified: Secondary | ICD-10-CM | POA: Insufficient documentation

## 2022-01-26 DIAGNOSIS — Z7984 Long term (current) use of oral hypoglycemic drugs: Secondary | ICD-10-CM | POA: Insufficient documentation

## 2022-01-26 DIAGNOSIS — Z79899 Other long term (current) drug therapy: Secondary | ICD-10-CM | POA: Insufficient documentation

## 2022-01-26 DIAGNOSIS — M79606 Pain in leg, unspecified: Secondary | ICD-10-CM | POA: Insufficient documentation

## 2022-01-26 DIAGNOSIS — I5042 Chronic combined systolic (congestive) and diastolic (congestive) heart failure: Secondary | ICD-10-CM | POA: Diagnosis not present

## 2022-01-26 DIAGNOSIS — Z6841 Body Mass Index (BMI) 40.0 and over, adult: Secondary | ICD-10-CM | POA: Diagnosis not present

## 2022-01-26 DIAGNOSIS — I5022 Chronic systolic (congestive) heart failure: Secondary | ICD-10-CM | POA: Diagnosis not present

## 2022-01-26 DIAGNOSIS — G4733 Obstructive sleep apnea (adult) (pediatric): Secondary | ICD-10-CM | POA: Diagnosis not present

## 2022-01-26 NOTE — Progress Notes (Signed)
[facility] - PHARMACIST COUNSELING NOTE  Guideline-Directed Medical Therapy/Evidence Based Medicine  ACE/ARB/ARNI:  Valsartan 160 twice daily Beta Blocker: Carvedilol 25 mg twice daily Aldosterone Antagonist:  None Diuretic: Furosemide 40 mg daily SGLT2i: Empagliflozin 10 mg daily  Adherence Assessment  Do you ever forget to take your medication? [x] Yes [] No  Do you ever skip doses due to side effects? [] Yes [x] No  Do you have trouble affording your medicines? [x] Yes [] No  Are you ever unable to pick up your medication due to transportation difficulties? [] Yes [x] No  Do you ever stop taking your medications because you don't believe they are helping? [] Yes [x] No  Do you check your weight daily? [] Yes [x] No   Adherence strategy: Does not use a pill box but says he has one. I encouraged him to use it since he also says he misses doses ever since he stopped working nights  Barriers to obtaining medications: Recently lost job. Has prescription insurance through the marketplace but unable to afford Entresto and Farxiga  Vital signs: HR 77, BP 192/102, weight (pounds) 358 ECHO: Date 7/23, EF 40-45%     Latest Ref Rng & Units 12/22/2021    2:40 PM 11/18/2021    9:28 AM 06/17/2021    9:09 AM  BMP  Glucose 70 - 99 mg/dL 102  104  109   BUN 6 - 20 mg/dL 24  19  25    Creatinine 0.61 - 1.24 mg/dL 1.47  1.33  1.25   Sodium 135 - 145 mmol/L 139  141  138   Potassium 3.5 - 5.1 mmol/L 3.6  4.2  3.8   Chloride 98 - 111 mmol/L 103  105  106   CO2 22 - 32 mmol/L 27  29  25    Calcium 8.9 - 10.3 mg/dL 8.7  9.0  8.9     Past Medical History:  Diagnosis Date   Acute kidney injury ([place])    Asthma    Chronic combined systolic and diastolic CHF (congestive heart failure) (HCC)    EF < 25% on Cath (09/17/2014)   Gout    Gout of big toe 09/16/2014   H/O medication noncompliance    Hypertension    Hypertensive heart disease    Hypokalemia    MI  (myocardial infarction) [facility])    Patient denies having had a MI   NICM (nonischemic cardiomyopathy) ([place])    a. cardiac cath 09/2014 with normal coronary arteries, EF 20-25% with severe global HK; b. echo 08/2014: EF 30-35%, false tendon in LV aepx of no clinical sig, diffuse HK, GR1DD, aortic root 40 mm, trivial MR, trivial pericardial effusion posterior    Obesity, Class III, BMI 40-49.9 (morbid obesity) ([place])    Sleep apnea    a. sleep study 05/2010; b. noncompliant with CPAP   Ventral hernia    a. incarcerated omentum 06/10/2015    ASSESSMENT 47 year old male with PMH HTN, CKD, T2DM who presents to the HF clinic for follow-up. Most recent ECHO in 07/2021 shows EF 40-45%. Regarding GDMT, patient is taking valsartan 160 mg twice daily, Jardiance 10 mg daily, and Coreg 25 mg twice daily. Patient also takes furosemide 40 mg. Patient had been on Entresto in the past but was unable to afford (at the time he earned too much to qualify for PAP) and was switched to valsartan. Because of similar cost issues, patient has run out of Jardiance and hasn't been able to refill. He has recently become  unemployed so will likely now qualify for PAP.  Regarding adherence, patient has a pill box but doesn't use it. Encouraged him to given that he has missed more doses recently. Found out that patient has only been taking valsartan and carvedilol once daily instead of twice daily, which could be contributing to his worsened blood pressure today.  Recent ED Visit (past 6 months): None  PLAN CHF/HTN Recommend spironolactone 25 mg daily Provided 4 weeks of samples of Jardiance Provided [employer] and [employer] paperwork for him to fill out for [employer] (in case we switch from valsartan to [place] in the future) and Jardiance Gave $10 Jardiance copay card, just in case  T2DM 11/2021 A1c 6.0% Continue metformin 500 mg daily  Time spent: 20 minutes  Will M. Dareen Piano, PharmD PGY-1 Pharmacy  Resident 01/26/2022 9:05 AM   Current Outpatient Medications:    albuterol (VENTOLIN HFA) 108 (90 Base) MCG/ACT inhaler, Inhale 2 puffs into the lungs every 6 (six) hours as needed for wheezing or shortness of breath., Disp: 8 g, Rfl: 0   amLODipine (NORVASC) 10 MG tablet, Take 1 tablet (10 mg total) by mouth daily., Disp: 90 tablet, Rfl: 3   carvedilol (COREG) 25 MG tablet, TAKE 1 TABLET BY MOUTH TWICE DAILY WITH MEALS, Disp: 180 tablet, Rfl: 3   empagliflozin (JARDIANCE) 10 MG TABS tablet, Take 1 tablet (10 mg total) by mouth daily before breakfast., Disp: 30 tablet, Rfl: 3   furosemide (LASIX) 40 MG tablet, Take 1 tablet by mouth once daily, Disp: 90 tablet, Rfl: 3   metFORMIN (GLUCOPHAGE) 500 MG tablet, Take 1 tablet (500 mg total) by mouth daily with breakfast., Disp: 90 tablet, Rfl: 3   valsartan (DIOVAN) 160 MG tablet, Take 1 tablet (160 mg total) by mouth 2 (two) times daily., Disp: 60 tablet, Rfl: 3   DRUGS TO CAUTION IN HEART FAILURE  Drug or Class Mechanism  Analgesics NSAIDs COX-2 inhibitors Glucocorticoids  Sodium and water retention, increased systemic vascular resistance, decreased response to diuretics   Diabetes Medications Metformin Thiazolidinediones Rosiglitazone (Avandia) Pioglitazone (Actos) DPP4 Inhibitors Saxagliptin (Onglyza) Sitagliptin (Januvia)   Lactic acidosis Possible calcium channel blockade   Unknown  Antiarrhythmics Class I  Flecainide Disopyramide Class III Sotalol Other Dronedarone  Negative inotrope, proarrhythmic   Proarrhythmic, beta blockade  Negative inotrope  Antihypertensives Alpha Blockers Doxazosin Calcium Channel Blockers Diltiazem Verapamil Nifedipine Central Alpha Adrenergics Moxonidine Peripheral Vasodilators Minoxidil  Increases renin and aldosterone  Negative inotrope    Possible sympathetic withdrawal  Unknown  Anti-infective Itraconazole Amphotericin B  Negative inotrope Unknown   Hematologic Anagrelide Cilostazol   Possible inhibition of PD IV Inhibition of PD III causing arrhythmias  Neurologic/Psychiatric Stimulants Anti-Seizure Drugs Carbamazepine Pregabalin Antidepressants Tricyclics Citalopram Parkinsons Bromocriptine Pergolide Pramipexole Antipsychotics Clozapine Antimigraine Ergotamine Methysergide Appetite suppressants Bipolar Lithium  Peripheral alpha and beta agonist activity  Negative inotrope and chronotrope Calcium channel blockade  Negative inotrope, proarrhythmic Dose-dependent QT prolongation  Excessive serotonin activity/valvular damage Excessive serotonin activity/valvular damage Unknown  IgE mediated hypersensitivy, calcium channel blockade  Excessive serotonin activity/valvular damage Excessive serotonin activity/valvular damage Valvular damage  Direct myofibrillar degeneration, adrenergic stimulation  Antimalarials Chloroquine Hydroxychloroquine Intracellular inhibition of lysosomal enzymes  Urologic Agents Alpha Blockers Doxazosin Prazosin Tamsulosin Terazosin  Increased renin and aldosterone  Adapted from Page Williemae Natter, et al. "Drugs That May Cause or Exacerbate Heart Failure: A Scientific Statement from the American Heart  Association." Circulation 2016; 134:e32-e69. DOI: 10.1161/CIR.0000000000000426   MEDICATION ADHERENCES TIPS AND STRATEGIES Taking medication as prescribed improves patient  outcomes in heart failure (reduces hospitalizations, improves symptoms, increases survival) Side effects of medications can be managed by decreasing doses, switching agents, stopping drugs, or adding additional therapy. Please let someone in the [facility] know if you have having bothersome side effects so we can modify your regimen. Do not alter your medication regimen without talking to [place].  Medication reminders can help patients remember to take drugs on time. If you are missing or forgetting doses you can try  linking behaviors, using pill boxes, or an electronic reminder like an alarm on your phone or an app. Some people can also get automated phone calls as medication reminders.

## 2022-01-26 NOTE — Patient Instructions (Addendum)
Continue weighing daily and call for an overnight weight gain of 3 pounds or more or a weekly weight gain of more than 5 pounds.   If you have voicemail, please make sure your mailbox is cleaned out so that we may leave a message and please make sure to listen to any voicemails.    If you receive a satisfaction survey regarding the [facility] Failure Clinic, please take the time to fill it out. This way we can continue to provide excellent care and make any changes that need to be made.    Make sure you take your carvedilol and valsartan twice daily.

## 2022-01-27 ENCOUNTER — Other Ambulatory Visit: Payer: Self-pay | Admitting: Family

## 2022-01-27 ENCOUNTER — Telehealth: Payer: Self-pay | Admitting: Family

## 2022-01-27 MED ORDER — SACUBITRIL-VALSARTAN 97-103 MG PO TABS: 1.0000 | ORAL_TABLET | Freq: Two times a day (BID) | ORAL | 3 refills | Status: DC

## 2022-01-27 MED ORDER — EMPAGLIFLOZIN 10 MG PO TABS: 10.0000 mg | ORAL_TABLET | Freq: Every day | ORAL | 3 refills | Status: DC

## 2022-01-27 NOTE — Progress Notes (Signed)
Jardiance and [employer] for patient assistance applications

## 2022-01-27 NOTE — Telephone Encounter (Signed)
Patient assistance application for Jardiance and Delene Loll has been faxed and pending approval.   Mitsy Owen. NT

## 2022-02-10 ENCOUNTER — Other Ambulatory Visit: Payer: Self-pay | Admitting: Internal Medicine

## 2022-02-10 DIAGNOSIS — R7303 Prediabetes: Secondary | ICD-10-CM

## 2022-02-10 NOTE — Telephone Encounter (Signed)
Requested medication (s) are due for refill today:yes  Requested medication (s) are on the active medication list: yes  Last refill:  01/23/21  Future visit scheduled: no  Notes to clinic:  Unable to refill per protocol, courtesy refill already given, routing for provider approval.      Requested Prescriptions  Pending Prescriptions Disp Refills   metFORMIN (GLUCOPHAGE) 500 MG tablet [Pharmacy Med Name: metFORMIN HCl 500 MG Oral Tablet] 30 tablet 0    Sig: Take 1 tablet by mouth once daily with breakfast     Endocrinology:  Diabetes - Biguanides Failed - 02/10/2022  5:21 AM      Failed - Cr in normal range and within 360 days    Creatinine, Ser  Date Value Ref Range Status  12/22/2021 1.47 (H) 0.61 - 1.24 mg/dL Final         Failed - eGFR in normal range and within 360 days    GFR calc Af Amer  Date Value Ref Range Status  02/15/2019 >60 >60 mL/min Final   GFR, Estimated  Date Value Ref Range Status  12/22/2021 59 (L) >60 mL/min Final    Comment:    (NOTE) Calculated using the CKD-EPI Creatinine Equation (2021)    GFR  Date Value Ref Range Status  10/07/2014 65.77 >60.00 mL/min Final         Failed - B12 Level in normal range and within 720 days    No results found for: "VITAMINB12"       Failed - Valid encounter within last 6 months    Recent Outpatient Visits           10 months ago Acute gout of right foot, unspecified cause   [place], DO   1 year ago Essential hypertension   [facility] Teodora Medici, DO              Failed - CBC within normal limits and completed in the last 12 months    WBC  Date Value Ref Range Status  01/21/2021 5.5 3.8 - 10.8 Thousand/uL Final   RBC  Date Value Ref Range Status  01/21/2021 4.80 4.20 - 5.80 Million/uL Final   Hemoglobin  Date Value Ref Range Status  01/21/2021 13.4 13.2 - 17.1 g/dL Final   HCT  Date Value Ref Range Status   01/21/2021 40.7 38.5 - 50.0 % Final   MCHC  Date Value Ref Range Status  01/21/2021 32.9 32.0 - 36.0 g/dL Final   [facility]  Date Value Ref Range Status  01/21/2021 27.9 27.0 - 33.0 pg Final   MCV  Date Value Ref Range Status  01/21/2021 84.8 80.0 - 100.0 fL Final   No results found for: "PLTCOUNTKUC", "LABPLAT", "POCPLA" RDW  Date Value Ref Range Status  01/21/2021 13.1 11.0 - 15.0 % Final         Passed - HBA1C is between 0 and 7.9 and within 180 days    Hgb A1c MFr Bld  Date Value Ref Range Status  11/18/2021 6.0 (H) 4.8 - 5.6 % Final    Comment:    (NOTE) Pre diabetes:          5.7%-6.4%  Diabetes:              >6.4%  Glycemic control for   <7.0% adults with diabetes

## 2022-02-19 ENCOUNTER — Telehealth: Payer: Self-pay | Admitting: Family

## 2022-02-19 NOTE — Telephone Encounter (Signed)
Patient was approved for jardiance patient assistance until 01/11/23   Noah Rodriguez, NT

## 2022-02-22 ENCOUNTER — Telehealth: Payer: Self-pay | Admitting: Family

## 2022-02-22 NOTE — Telephone Encounter (Signed)
Patients [employer] application for Delene Loll is pending proof of income. I have notified patient but he is unemployed. He will have to communicate with  [employer] to show proof of no income to get application approved.    Tharun Cappella, NT

## 2022-02-25 ENCOUNTER — Encounter: Payer: 59 | Admitting: Family

## 2022-03-02 ENCOUNTER — Ambulatory Visit: Payer: 59 | Attending: Family | Admitting: Family

## 2022-03-02 ENCOUNTER — Encounter: Payer: Self-pay | Admitting: Family

## 2022-03-02 ENCOUNTER — Encounter: Payer: Self-pay | Admitting: Pharmacist

## 2022-03-02 ENCOUNTER — Other Ambulatory Visit: Payer: Self-pay | Admitting: Family

## 2022-03-02 VITALS — BP 165/116 | HR 77 | Resp 20 | Wt 357.0 lb

## 2022-03-02 DIAGNOSIS — I10 Essential (primary) hypertension: Secondary | ICD-10-CM

## 2022-03-02 DIAGNOSIS — G4733 Obstructive sleep apnea (adult) (pediatric): Secondary | ICD-10-CM

## 2022-03-02 DIAGNOSIS — J45909 Unspecified asthma, uncomplicated: Secondary | ICD-10-CM | POA: Insufficient documentation

## 2022-03-02 DIAGNOSIS — I110 Hypertensive heart disease with heart failure: Secondary | ICD-10-CM | POA: Diagnosis not present

## 2022-03-02 DIAGNOSIS — I5022 Chronic systolic (congestive) heart failure: Secondary | ICD-10-CM | POA: Diagnosis not present

## 2022-03-02 DIAGNOSIS — I509 Heart failure, unspecified: Secondary | ICD-10-CM | POA: Insufficient documentation

## 2022-03-02 DIAGNOSIS — E119 Type 2 diabetes mellitus without complications: Secondary | ICD-10-CM | POA: Diagnosis not present

## 2022-03-02 NOTE — Progress Notes (Signed)
Patient ID: Noah Rodriguez, male    DOB: 07/27/1975, 47 y.o.   MRN: [id number]  HPI  Noah Rodriguez is a 47 y/o male with a history of obstructive sleep apnea, obesity, hypokalemia, gout, asthma and chronic heart failure.   Echo report from 08/07/21 reviewed and showed an EF of 40%-45%. Echo done 08/25/16 reviewed and shows an EF of 60-65%. Echo done 02/14/16 and showed an EF of 35-40% along with mild Noah/TR.    Last cardiac catheterization was done 09/17/14 and showed an EF of 20-25% at that time. No CAD present.   Has not been admitted or been in the ED in the last 6 months.    He presents today for a HF follow-up visit with a chief complaint of minimal fatigue with moderate exertion. Describes this as chronic in nature. Has associated cough, SOB & left knee pain. Denies any difficulty sleeping, dizziness, abdominal distention, palpitations, pedal edema, chest pain, wheezing or weight gain.   Has been out of amlodipine, furosemide & valsartan for the last 2 days and has to go pick up the prescriptions today. Admits that he's feeling depressed because he hasn't found a job yet. Says that he's put in [id number] applications and is just waiting to hear something.   Past Medical History:  Diagnosis Date   Acute kidney injury ([place])    Asthma    Chronic combined systolic and diastolic CHF (congestive heart failure) (HCC)    EF < 25% on Cath (09/17/2014)   Gout    Gout of big toe 09/16/2014   H/O medication noncompliance    Hypertension    Hypertensive heart disease    Hypokalemia    MI (myocardial infarction) [facility])    Patient denies having had a MI   NICM (nonischemic cardiomyopathy) ([place])    a. cardiac cath 09/2014 with normal coronary arteries, EF 20-25% with severe global HK; b. echo 08/2014: EF 30-35%, false tendon in LV aepx of no clinical sig, diffuse HK, GR1DD, aortic root 40 mm, trivial Noah, trivial pericardial effusion posterior    Obesity, Class III, BMI 40-49.9 (morbid obesity) ([place])    Sleep  apnea    a. sleep study 05/2010; b. noncompliant with CPAP   Ventral hernia    a. incarcerated omentum 06/10/2015   Past Surgical History:  Procedure Laterality Date   CARDIAC CATHETERIZATION N/A 09/17/2014   Procedure: Right/Left Heart Cath and Coronary Angiography;  Surgeon: Troy Sine, MD;  Location: [place] [facility];  Service: Cardiovascular;  Laterality: N/A;   VENTRAL HERNIA REPAIR N/A 06/11/2015   Procedure: HERNIA REPAIR VENTRAL ADULT;  Surgeon: Florene Glen, MD;  Location: [facility] ORS;  Service: General;  Laterality: N/A;   Family History  Problem Relation Age of Onset   HIV Mother    Hypertension Mother    Hypertension Other    Hypertension Maternal Grandmother    Hypertension Paternal Grandmother    Alcohol abuse Neg Hx    Cancer Neg Hx    Early death Neg Hx    Heart disease Neg Hx    Hyperlipidemia Neg Hx    Kidney disease Neg Hx    Stroke Neg Hx    Heart attack Neg Hx    Social History   Tobacco Use   Smoking status: Never   Smokeless tobacco: Never  Substance Use Topics   Alcohol use: No   No Known Allergies  Prior to Admission medications   Medication Sig Start Date End Date Taking? Authorizing  Provider  acetaminophen (TYLENOL) 650 MG CR tablet Take 650 mg by mouth every 8 (eight) hours as needed for pain.   Yes [provider]  albuterol (VENTOLIN HFA) 108 (90 Base) MCG/ACT inhaler Inhale 2 puffs into the lungs every 6 (six) hours as needed for wheezing or shortness of breath. 01/21/21  Yes Teodora Medici, DO  amLODipine (NORVASC) 10 MG tablet Take 1 tablet (10 mg total) by mouth daily. 03/27/21  Yes Nakayla Rorabaugh, Otila Kluver A, FNP  carvedilol (COREG) 25 MG tablet TAKE 1 TABLET BY MOUTH TWICE DAILY WITH MEALS Patient taking differently: Take 25 mg by mouth 2 (two) times daily with a meal. 01/29/21  Yes Darylene Price A, FNP  empagliflozin (JARDIANCE) 10 MG TABS tablet Take 1 tablet (10 mg total) by mouth daily before breakfast. 01/27/22  Yes Darylene Price  A, FNP  furosemide (LASIX) 40 MG tablet Take 1 tablet by mouth once daily Patient taking differently: Take 40 mg by mouth daily. 01/29/21  Yes Alisa Graff, FNP  metFORMIN (GLUCOPHAGE) 500 MG tablet Take 1 tablet by mouth once daily with breakfast 02/11/22  Yes Teodora Medici, DO  sacubitril-valsartan (ENTRESTO) 97-103 MG Take 1 tablet by mouth 2 (two) times daily. Patient not taking: Reported on 03/02/2022 01/27/22   Alisa Graff, FNP   Review of Systems  Constitutional:  Positive for fatigue. Negative for appetite change.  HENT:  Positive for postnasal drip. Negative for congestion and sore throat.   Eyes:  Negative for pain and visual disturbance.  Respiratory:  Positive for apnea, cough and shortness of breath (minimal). Negative for chest tightness and wheezing.   Cardiovascular:  Negative for chest pain, palpitations and leg swelling.  Gastrointestinal:  Negative for abdominal distention, constipation, diarrhea and nausea.  Endocrine: Negative.   Genitourinary:  Negative for difficulty urinating.  Musculoskeletal:  Positive for arthralgias (left knee). Negative for back pain and neck pain.  Skin: Negative.   Allergic/Immunologic: Negative.   Neurological:  Negative for dizziness, weakness, light-headedness and numbness.  Hematological:  Negative for adenopathy. Does not bruise/bleed easily.  Psychiatric/Behavioral:  Positive for dysphoric mood. Negative for sleep disturbance and suicidal ideas. The patient is not nervous/anxious.    Vitals:   03/02/22 0851  BP: (!) 165/116  Pulse: 77  Resp: 20  SpO2: 95%  Weight: (!) 357 lb (161.9 kg)   Wt Readings from Last 3 Encounters:  03/02/22 (!) 357 lb (161.9 kg)  01/26/22 (!) 358 lb 8 oz (162.6 kg)  12/22/21 (!) 366 lb 8 oz (166.2 kg)   Lab Results  Component Value Date   CREATININE 1.47 (H) 12/22/2021   CREATININE 1.33 (H) 11/18/2021   CREATININE 1.25 (H) 06/17/2021   Physical Exam Vitals and nursing note reviewed.   Constitutional:      Appearance: He is well-developed. He is obese.  HENT:     Head: Normocephalic and atraumatic.  Neck:     Vascular: No JVD.  Cardiovascular:     Rate and Rhythm: Normal rate and regular rhythm.  Pulmonary:     Effort: Pulmonary effort is normal.     Breath sounds: No wheezing or rales.  Abdominal:     General: There is no distension.     Palpations: Abdomen is soft.     Tenderness: There is no abdominal tenderness.  Musculoskeletal:        General: No tenderness.     Cervical back: Normal range of motion and neck supple.     Right lower leg:  Edema (trace pitting) present.     Left lower leg: Edema (trace pitting) present.  Skin:    General: Skin is warm and dry.  Neurological:     General: No focal deficit present.     Mental Status: He is alert and oriented to person, place, and time.  Psychiatric:        Mood and Affect: Mood is depressed.        Behavior: Behavior normal.        Thought Content: Thought content normal.     Assessment & Plan:  1: Chronic heart failure with mildly reduced ejection fraction- - NYHA class II - euvolemic - weighing daily; reminded to call for an overnight weight gain of >2 pounds or a weekly weight gain of >5 pounds - weight down 1.8 pounds from last visit here 1 month ago - says that he's been walking "a lot more" since he's been unemployed; encouraged him to keep this up; has lost 9 pounds since he became unemployed - emotional support provided regarding his inability to find employment yet - drinking 16 oz pepsi or 32 oz pepsi daily and then water - not adding any salt to his food & really trying to watch his sodium intake if he eats out - carvedilol 14m BID - jardiance 138mdaily; now receiving through patient assistance - start entresto [id number]; income form faxed to [employer] patient assistance today; stop valsartan - BMP next visit - pro-BNP on 11/04/19 was 82  - PharmD reconciled meds w/ patient  2:  HTN- - BP 165/116 - getting amlodipine 67m27mlled today; starting entresto per above - had video visit with PCP (AndRosana Berger10/23 - BMP 12/22/21 reviewed and showed Na 139, K 3.6, GFR 59 and Scr 1.47  3: Sleep apnea- - NS for pulmonology 04/08/21; this needs to be r/s  4: DM- - A1c 11/18/21 was 6.0% - will hold off ozempic until we have his insurance information worked through   Medication bottles reviewed.   Return in 3 weeks, sooner if needed.

## 2022-03-02 NOTE — Patient Instructions (Addendum)
Continue weighing daily and call for an overnight weight gain of 3 pounds or more or a weekly weight gain of more than 5 pounds.   If you have voicemail, please make sure your mailbox is cleaned out so that we may leave a message and please make sure to listen to any voicemails.    Start taking entresto as 1 tablet in the morning and 1 tablet in the evening

## 2022-03-03 NOTE — Progress Notes (Signed)
Patient ID: Noah Rodriguez, male   DOB: February 08, 1975, 47 y.o.   MRN: [id number] [place] COUNSELING NOTE  Guideline-Directed Medical Therapy/Evidence Based Medicine  ACE/ARB/ARNI: Losartan 100 mg daily Beta Blocker: Carvedilol 25 mg twice daily Aldosterone Antagonist: None Diuretic: Furosemide 40 mg daily SGLT2i: empagliflozin 47m (out of med)  Adherence Assessment  Do you ever forget to take your medication? []$ Yes [x]$ No  Do you ever skip doses due to side effects? []$ Yes [x]$ No  Do you have trouble affording your medicines? [x]$ Yes []$ No  Are you ever unable to pick up your medication due to transportation difficulties? []$ Yes [x]$ No  Do you ever stop taking your medications because you don't believe they are helping? []$ Yes [x]$ No  Do you check your weight daily? []$ Yes [x]$ No   Adherence strategy: associates medications with time of day  Barriers to obtaining medications: unable to afford high copay of Jardiance and Entresto. Currently approved for Jardiance patient assistance. [facility] for [facility] assistance approval.  Vital signs: HR 86, BP 158/97, weight (pounds) 374 lb ECHO: Date 08/14/2016, EF 60-65%, notes: normal systolic function; wall motion normal; no regional wall motion abnormalities; normal left ventricular function; normal study Cath: Date 09/17/14, EF 20-25%, notes: left ventricular dilatation with severe global hypo-contractility     Latest Ref Rng & Units 12/22/2021    2:40 PM 11/18/2021    9:28 AM 06/17/2021    9:09 AM  BMP  Glucose 70 - 99 mg/dL 102  104  109   BUN 6 - 20 mg/dL 24  19  25   $ Creatinine 0.61 - 1.24 mg/dL 1.47  1.33  1.25   Sodium 135 - 145 mmol/L 139  141  138   Potassium 3.5 - 5.1 mmol/L 3.6  4.2  3.8   Chloride 98 - 111 mmol/L 103  105  106   CO2 22 - 32 mmol/L 27  29  25   $ Calcium 8.9 - 10.3 mg/dL 8.7  9.0  8.9     Past Medical History:  Diagnosis Date   [place]    Asthma    Chronic combined systolic and diastolic CHF (congestive heart failure) (HCC)    EF < 25% on Cath (09/17/2014)   Gout    Gout of big toe 09/16/2014   H/O medication noncompliance    Hypertension    Hypertensive heart disease    Hypokalemia    [facility]    Patient denies having had a MI   [place]mic [place]    a. cardiac cath 09/2014 with normal coronary arteries, EF 20-25% with severe global HK; b. echo 08/2014: EF 30-35%, false tendon in LV aepx of no clinical sig, diffuse HK, GR1DD, aortic root 40 mm, trivial MR, trivial pericardial effusion posterior    Obesity, Class III, BMI 40-49.9 (morbid obesity) (HCC)    Sleep apnea    a. sleep study 05/2010; b. noncompliant with CPAP   Ventral hernia    a. incarcerated omentum 06/10/2015    ASSESSMENT 47year old male who presents to the HF clinic for follow up. PMH relevant to sleep apnea, obesity, gout, asthma, CHF. In 2016, cath showed EF of 20-25%, then in 2018 ECHO with EF 60-65% and essentially normal study. No other ECHO's since this date due to cost and uninsured status. Patient denies issues with medication. 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 Mr WSmtihcontinues to have self-pay insurance,  but needs affordable medication   Medication reconciliation was completed today during appointment. Patient already approved for Jardiance patient assistance and working on [place] for [employer]. Last time took all his meds was Sunday. Ran out of valsartan, and amlodipine.  PLAN Resume amlodipine as previously prescribed Will provide Entresto samples today until prior authorization approved. Continue all other medication as prescribed and follow up per NP indications. Plan to add spironolactone to regimen once patient compliant with current regimen and if renal function remains stable.  Time spent: 20 minutes  Ravon Mortellaro Rodriguez-Guzman PharmD, BCPS 03/03/2022 8:10  AM   Current Outpatient Medications:    acetaminophen (TYLENOL) 650 MG CR tablet, Take 650 mg by mouth every 8 (eight) hours as needed for pain., Disp: , Rfl:    albuterol (VENTOLIN HFA) 108 (90 Base) MCG/ACT inhaler, Inhale 2 puffs into the lungs every 6 (six) hours as needed for wheezing or shortness of breath., Disp: 8 g, Rfl: 0   amLODipine (NORVASC) 10 MG tablet, Take 1 tablet (10 mg total) by mouth daily. (Patient not taking: Reported on 03/02/2022), Disp: 90 tablet, Rfl: 3   carvedilol (COREG) 25 MG tablet, TAKE 1 TABLET BY MOUTH TWICE DAILY WITH MEALS (Patient taking differently: Take 25 mg by mouth 2 (two) times daily with a meal.), Disp: 180 tablet, Rfl: 3   empagliflozin (JARDIANCE) 10 MG TABS tablet, Take 1 tablet (10 mg total) by mouth daily before breakfast., Disp: 90 tablet, Rfl: 3   furosemide (LASIX) 40 MG tablet, Take 1 tablet (40 mg total) by mouth daily., Disp: 90 tablet, Rfl: 3   metFORMIN (GLUCOPHAGE) 500 MG tablet, Take 1 tablet by mouth once daily with breakfast, Disp: 30 tablet, Rfl: 0   sacubitril-valsartan (ENTRESTO) 97-103 MG, Take 1 tablet by mouth 2 (two) times daily., Disp: 180 tablet, Rfl: 3   MEDICATION ADHERENCES TIPS AND STRATEGIES Taking medication as prescribed improves patient outcomes in heart failure (reduces hospitalizations, improves symptoms, increases survival) Side effects of medications can be managed by decreasing doses, switching agents, stopping drugs, or adding additional therapy. Please let someone in the [facility] know if you have having bothersome side effects so we can modify your regimen. Do not alter your medication regimen without talking to [place].  Medication reminders can help patients remember to take drugs on time. If you are missing or forgetting doses you can try linking behaviors, using pill boxes, or an electronic reminder like an alarm on your phone or an app. Some people can also get automated phone calls as medication  reminders.

## 2022-03-22 ENCOUNTER — Encounter: Payer: 59 | Admitting: Family

## 2022-03-26 ENCOUNTER — Encounter: Payer: 59 | Admitting: Family

## 2022-03-30 ENCOUNTER — Other Ambulatory Visit: Payer: Self-pay | Admitting: Family

## 2022-04-07 ENCOUNTER — Telehealth: Payer: Self-pay | Admitting: Family

## 2022-04-07 ENCOUNTER — Encounter: Payer: 59 | Admitting: Family

## 2022-04-07 NOTE — Telephone Encounter (Signed)
Patient did not show for his Heart Failure Clinic appointment on 04/07/22.

## 2022-04-08 ENCOUNTER — Ambulatory Visit (HOSPITAL_BASED_OUTPATIENT_CLINIC_OR_DEPARTMENT_OTHER): Payer: 59 | Admitting: Family

## 2022-04-08 ENCOUNTER — Other Ambulatory Visit
Admission: RE | Admit: 2022-04-08 | Discharge: 2022-04-08 | Disposition: A | Discharge status: Home / Self Care | Payer: 59 | Source: Ambulatory Visit | Attending: Family | Admitting: Family

## 2022-04-08 ENCOUNTER — Encounter: Payer: Self-pay | Admitting: Family

## 2022-04-08 VITALS — BP 106/75 | HR 72 | Wt 348.0 lb

## 2022-04-08 DIAGNOSIS — I5022 Chronic systolic (congestive) heart failure: Secondary | ICD-10-CM

## 2022-04-08 DIAGNOSIS — I10 Essential (primary) hypertension: Secondary | ICD-10-CM

## 2022-04-08 DIAGNOSIS — G4733 Obstructive sleep apnea (adult) (pediatric): Secondary | ICD-10-CM

## 2022-04-08 DIAGNOSIS — E119 Type 2 diabetes mellitus without complications: Secondary | ICD-10-CM

## 2022-04-08 LAB — BASIC METABOLIC PANEL
Anion gap: 10 (ref 5–15)
BUN: 17 mg/dL (ref 6–20)
CO2: 27 mmol/L (ref 22–32)
Calcium: 9.1 mg/dL (ref 8.9–10.3)
Chloride: 100 mmol/L (ref 98–111)
Creatinine, Ser: 1.34 mg/dL — ABNORMAL HIGH (ref 0.61–1.24)
GFR, Estimated: >60 mL/min (ref >60)
Glucose, Bld: 88 mg/dL (ref 70–99)
Potassium: 4 mmol/L (ref 3.5–5.1)
Sodium: 137 mmol/L (ref 135–145)

## 2022-04-08 NOTE — Progress Notes (Signed)
Patient ID: Noah Rodriguez, male    DOB: Dec 19, 1975, 47 y.o.   MRN: [id number]  HPI  Noah Rodriguez is a 47 y/o male with a history of obstructive sleep apnea, obesity, hypokalemia, gout, asthma and chronic heart failure.   Echo 08/07/21: EF of 40%-45%. Echo 08/25/16: EF of 60-65%. Echo 02/14/16: EF of 35-40% along with mild Noah/TR.    Cardiac catheterization was done 09/17/14 and showed an EF of 20-25% at that time. No CAD present.   Has not been admitted or been in the ED in the last 6 months.    He presents today for a HF follow-up visit with a chief complaint of minimal SOB with moderate exertion. Chronic in nature. Has fatigue, mild cough and occasional knee pain along with this. Denies difficulty sleeping, dizziness, abdominal distention, palpitations, pedal edema, chest pain, wheezing or weight gain.   Intermittently gets SOB but usually this occurs if he has to walk a long distance. Denies any SOB/ chest pain when lifting heavy objects (anything >25 pounds). 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 May, on occasion, have to manually lift something.   Past Medical History:  Diagnosis Date   Acute kidney injury ([place])    Asthma    Chronic combined systolic and diastolic CHF (congestive heart failure) (HCC)    EF < 25% on Cath (09/17/2014)   Gout    Gout of big toe 09/16/2014   H/O medication noncompliance    Hypertension    Hypertensive heart disease    Hypokalemia    MI (my[facility] infarction) [facility])    Patient denies having had a MI   NICM (nonischemic cardiomyopathy) ([place])    a. cardiac cath 09/2014 with normal coronary arteries, EF 20-25% with severe global HK; b. echo 08/2014: EF 30-35%, false tendon in LV aepx of no clinical sig, diffuse HK, GR1DD, aortic root 40 mm, trivial Noah, trivial pericardial effusion posterior    Obesity, Class III, BMI 40-49.9 (morbid obesity) ([place])    Sleep apnea    a. sleep study 05/2010; b. noncompliant with CPAP    Ventral hernia    a. incarcerated omentum 06/10/2015   Past Surgical History:  Procedure Laterality Date   CARDIAC CATHETERIZATION N/A 09/17/2014   Procedure: Right/Left Heart Cath and Coronary Angiography;  Surgeon: Troy Sine, MD;  Location: [place] [facility];  Service: Cardiovascular;  Laterality: N/A;   VENTRAL HERNIA REPAIR N/A 06/11/2015   Procedure: HERNIA REPAIR VENTRAL ADULT;  Surgeon: Florene Glen, MD;  Location: [facility] ORS;  Service: General;  Laterality: N/A;   Family History  Problem Relation Age of Onset   HIV Mother    Hypertension Mother    Hypertension Other    Hypertension Maternal Grandmother    Hypertension Paternal Grandmother    Alcohol abuse Neg Hx    Cancer Neg Hx    Early death Neg Hx    Heart disease Neg Hx    Hyperlipidemia Neg Hx    Kidney disease Neg Hx    Stroke Neg Hx    Heart attack Neg Hx    Social History   Tobacco Use   Smoking status: Never   Smokeless tobacco: Never  Substance Use Topics   Alcohol use: No   No Known Allergies  Prior to Admission medications   Medication Sig Start Date End Date Taking? Authorizing Provider  acetaminophen (TYLENOL) 650 MG CR tablet Take 650 mg by mouth every 8 (  eight) hours as needed for pain.   Yes [provider]  albuterol (VENTOLIN HFA) 108 (90 Base) MCG/ACT inhaler Inhale 2 puffs into the lungs every 6 (six) hours as needed for wheezing or shortness of breath. 01/21/21  Yes Teodora Medici, DO  amLODipine (NORVASC) 10 MG tablet Take 1 tablet by mouth once daily 03/30/22  Yes Chandi Nicklin, Otila Kluver A, FNP  carvedilol (COREG) 25 MG tablet TAKE 1 TABLET BY MOUTH TWICE DAILY WITH MEALS Patient taking differently: Take 25 mg by mouth 2 (two) times daily with a meal. 01/29/21  Yes Darylene Price A, FNP  empagliflozin (JARDIANCE) 10 MG TABS tablet Take 1 tablet (10 mg total) by mouth daily before breakfast. 01/27/22  Yes Darylene Price A, FNP  furosemide (LASIX) 40 MG tablet Take 1 tablet (40 mg total) by  mouth daily. 03/02/22  Yes Alisa Graff, FNP  metFORMIN (GLUCOPHAGE) 500 MG tablet Take 1 tablet by mouth once daily with breakfast 02/11/22  Yes Teodora Medici, DO  sacubitril-valsartan (ENTRESTO) 97-103 MG Take 1 tablet by mouth 2 (two) times daily. 01/27/22  Yes Alisa Graff, FNP   Review of Systems  Constitutional:  Positive for fatigue. Negative for appetite change.  HENT:  Positive for postnasal drip. Negative for congestion and sore throat.   Eyes:  Negative for pain and visual disturbance.  Respiratory:  Positive for apnea, cough and shortness of breath (minimal). Negative for chest tightness and wheezing.   Cardiovascular:  Negative for chest pain, palpitations and leg swelling.  Gastrointestinal:  Negative for abdominal distention, abdominal pain, constipation, diarrhea and nausea.  Endocrine: Negative.   Genitourinary:  Negative for difficulty urinating.  Musculoskeletal:  Positive for arthralgias (left knee). Negative for back pain and neck pain.  Skin: Negative.   Allergic/Immunologic: Negative.   Neurological:  Negative for dizziness, weakness, light-headedness and numbness.  Hematological:  Negative for adenopathy. Does not bruise/bleed easily.  Psychiatric/Behavioral:  Negative for dysphoric mood, sleep disturbance and suicidal ideas. The patient is not nervous/anxious.    Vitals:   04/08/22 1522  BP: 106/75  Pulse: 72  SpO2: 98%  Weight: (!) 348 lb (157.9 kg)   Wt Readings from Last 3 Encounters:  04/08/22 (!) 348 lb (157.9 kg)  03/02/22 (!) 357 lb (161.9 kg)  01/26/22 (!) 358 lb 8 oz (162.6 kg)   Lab Results  Component Value Date   CREATININE 1.47 (H) 12/22/2021   CREATININE 1.33 (H) 11/18/2021   CREATININE 1.25 (H) 06/17/2021   Physical Exam Vitals and nursing note reviewed.  Constitutional:      Appearance: He is well-developed. He is obese.  HENT:     Head: Normocephalic and atraumatic.  Neck:     Vascular: No JVD.  Cardiovascular:     Rate  and Rhythm: Normal rate and regular rhythm.  Pulmonary:     Effort: Pulmonary effort is normal.     Breath sounds: No wheezing or rales.  Abdominal:     General: There is no distension.     Palpations: Abdomen is soft.     Tenderness: There is no abdominal tenderness.  Musculoskeletal:        General: No tenderness.     Cervical back: Normal range of motion and neck supple.     Right lower leg: Edema (trace pitting) present.     Left lower leg: Edema (trace pitting) present.  Skin:    General: Skin is warm and dry.  Neurological:     General: No focal deficit  present.     Mental Status: He is alert and oriented to person, place, and time.  Psychiatric:        Mood and Affect: Mood normal.        Behavior: Behavior normal.        Thought Content: Thought content normal.     Assessment & Plan:  1: NICM with mildly reduced ejection fraction- - NYHA class II - euvolemic - weighing daily; understands to call for an overnight weight gain of > 2 pounds or a weekly weight gain of . 5 pounds - weight down 9 pounds from last visit here 5 weeks ago - says that he's been walking "a lot more" since he's been unemployed; encouraged him to keep this up; has lost another 9 pounds - echo 08/07/21: EF of 40%-45%. Echo 08/25/16: EF of 60-65%. Echo 02/14/16: EF of 35-40% along with mild Noah/TR.   - drinking 16 oz pepsi with 32 oz water - not adding any salt to his food & really trying to watch his sodium intake if he eats out - carvedilol 25mg  BID - jardiance 10mg  daily; now receiving through patient assistance - entresto 97/103mg  BID - BMP today - due to stability of symptoms, he is cleared to work. No need for any restrictions - pro-BNP on 11/04/19 was 82   2: HTN- - BP 106/75 - had video visit with PCP Rosana Berger) 03/20/21 - BMP 12/22/21 reviewed and showed Na 139, K 3.6, GFR 59 and Scr 1.47  3: Sleep apnea- - NS for pulmonology 04/08/21; this needs to be r/s  4: DM- - A1c 11/18/21 was  6.0% - will hold off ozempic until we have his insurance information worked through  Return in 1 month, sooner if needed.

## 2022-04-09 ENCOUNTER — Telehealth: Payer: Self-pay

## 2022-04-09 NOTE — Telephone Encounter (Signed)
Pt's form from [facility] for work clearance was completed and faxed to [phone]. Pt aware.

## 2022-04-13 ENCOUNTER — Other Ambulatory Visit: Payer: Self-pay | Admitting: Internal Medicine

## 2022-04-13 DIAGNOSIS — R7303 Prediabetes: Secondary | ICD-10-CM

## 2022-04-13 NOTE — Telephone Encounter (Signed)
Requested medication (s) are due for refill today: yes  Requested medication (s) are on the active medication list: yes  Last refill:  02/11/22  Future visit scheduled: yes  Notes to clinic:  Unable to refill per protocol, courtesy refill already given, routing for provider approval.      Requested Prescriptions  Pending Prescriptions Disp Refills   metFORMIN (GLUCOPHAGE) 500 MG tablet [Pharmacy Med Name: metFORMIN HCl 500 MG Oral Tablet] 30 tablet 0    Sig: Take 1 tablet by mouth once daily with breakfast     Endocrinology:  Diabetes - Biguanides Failed - 04/13/2022 10:35 AM      Failed - Cr in normal range and within 360 days    Creatinine, Ser  Date Value Ref Range Status  04/08/2022 1.34 (H) 0.61 - 1.24 mg/dL Final         Failed - B12 Level in normal range and within 720 days    No results found for: "VITAMINB12"       Failed - Valid encounter within last 6 months    Recent Outpatient Visits           1 year ago Acute gout of right foot, unspecified cause   [place], DO   1 year ago Essential hypertension   [facility] Teodora Medici, DO              Failed - CBC within normal limits and completed in the last 12 months    WBC  Date Value Ref Range Status  01/21/2021 5.5 3.8 - 10.8 Thousand/uL Final   RBC  Date Value Ref Range Status  01/21/2021 4.80 4.20 - 5.80 Million/uL Final   Hemoglobin  Date Value Ref Range Status  01/21/2021 13.4 13.2 - 17.1 g/dL Final   HCT  Date Value Ref Range Status  01/21/2021 40.7 38.5 - 50.0 % Final   MCHC  Date Value Ref Range Status  01/21/2021 32.9 32.0 - 36.0 g/dL Final   [facility]  Date Value Ref Range Status  01/21/2021 27.9 27.0 - 33.0 pg Final   MCV  Date Value Ref Range Status  01/21/2021 84.8 80.0 - 100.0 fL Final   No results found for: "PLTCOUNTKUC", "LABPLAT", "POCPLA" RDW  Date Value Ref Range Status  01/21/2021 13.1 11.0 -  15.0 % Final         Passed - HBA1C is between 0 and 7.9 and within 180 days    Hgb A1c MFr Bld  Date Value Ref Range Status  11/18/2021 6.0 (H) 4.8 - 5.6 % Final    Comment:    (NOTE) Pre diabetes:          5.7%-6.4%  Diabetes:              >6.4%  Glycemic control for   <7.0% adults with diabetes          Passed - eGFR in normal range and within 360 days    GFR calc Af Amer  Date Value Ref Range Status  02/15/2019 >60 >60 mL/min Final   GFR, Estimated  Date Value Ref Range Status  04/08/2022 >60 >60 mL/min Final    Comment:    (NOTE) Calculated using the CKD-EPI Creatinine Equation (2021)    GFR  Date Value Ref Range Status  10/07/2014 65.77 >60.00 mL/min Final

## 2022-04-14 ENCOUNTER — Encounter: Payer: 59 | Admitting: Family

## 2022-04-15 ENCOUNTER — Other Ambulatory Visit: Payer: Self-pay | Admitting: Family

## 2022-04-15 ENCOUNTER — Telehealth: Payer: Self-pay

## 2022-04-15 NOTE — Telephone Encounter (Signed)
Informed pt that I spoke with [employer] pt assistance. They have received pt attestation of no income, but had misplaced it and have not approved his application yet. They said they will prioritize his application. Pt verbalized understanding.  Pt is out of his entresto medication and will come to office to pick up samples of medication tomorrow, [id number], while application is pending.

## 2022-04-16 ENCOUNTER — Encounter: Payer: Self-pay | Admitting: Pharmacist

## 2022-04-16 NOTE — Progress Notes (Signed)
Patient ID: Noah Rodriguez, male   DOB: Jun 09, 1975, 47 y.o.   MRN: [id number]  Medication Samples have been provided to the patient.  Drug name: Sherryll Burger       Strength: 49/51        Qty: 56 tab  LOT: [id number]  Exp.Date: Mar/2025  Dosing instructions: Take 2 tablets twice daily  The patient has been instructed regarding the correct time, dose, and frequency of taking this medication, including desired effects and most common side effects.   [name] PharmD, BCPS 04/16/2022 11:28 AM

## 2022-05-05 NOTE — Progress Notes (Unsigned)
Patient ID: Noah Rodriguez, male    DOB: Sep 21, 1975, 47 y.o.   MRN: [id number]  Primary cardiologist: None PCP: Margarita Mail, DO (last seen 03/23)  HPI  Noah Rodriguez is a 47 y/o male with a history of obstructive sleep apnea, obesity, hypokalemia, gout, asthma and chronic heart failure.   Echo 08/07/21: EF of 40%-45%. Echo 08/25/16: EF of 60-65%. Echo 02/14/16: EF of 35-40% along with mild Noah/TR.    Cardiac catheterization was done 09/17/14 and showed an EF of 20-25% at that time. No CAD present.   Has not been admitted or been in the ED in the last 6 months.    He presents today for a HF follow-up visit with a chief complaint of minimal SOB with moderate exertion. Chronic in nature. Has fatigue, cough, left great toe pain, snoring and apneic episodes along with this. Denies difficulty sleeping, dizziness, abdominal distention, palpitations, pedal edema, chest pain, wheezing or weight gain.   Has a gout flare in his left great toe. He realizes that if he drinks anything with liquid sugar or processed foods that it will trigger his gout. Yesterday he had a small icee from [employer] and then this morning he woke up with his toe hurting.   Continues to be unemployed although has become quite active. He has lost 20 pounds in the last 4 months.   Past Medical History:  Diagnosis Date   Acute kidney injury (HCC)    Asthma    Chronic combined systolic and diastolic CHF (congestive heart failure) (HCC)    EF < 25% on Cath (09/17/2014)   Gout    Gout of big toe 09/16/2014   H/O medication noncompliance    Hypertension    Hypertensive heart disease    Hypokalemia    MI (myocardial infarction) [facility])    Patient denies having had a MI   NICM (nonischemic cardiomyopathy) (HCC)    a. cardiac cath 09/2014 with normal coronary arteries, EF 20-25% with severe global HK; b. echo 08/2014: EF 30-35%, false tendon in LV aepx of no clinical sig, diffuse HK, GR1DD, aortic root 40 mm, trivial Noah, trivial  pericardial effusion posterior    Obesity, Class III, BMI 40-49.9 (morbid obesity) (HCC)    Sleep apnea    a. sleep study 05/2010; b. noncompliant with CPAP   Ventral hernia    a. incarcerated omentum 06/10/2015   Past Surgical History:  Procedure Laterality Date   CARDIAC CATHETERIZATION N/A 09/17/2014   Procedure: Right/Left Heart Cath and Coronary Angiography;  Surgeon: Lennette Bihari, MD;  Location: [facility] INVASIVE CV LAB;  Service: Cardiovascular;  Laterality: N/A;   VENTRAL HERNIA REPAIR N/A 06/11/2015   Procedure: HERNIA REPAIR VENTRAL ADULT;  Surgeon: Lattie Haw, MD;  Location: [facility] ORS;  Service: General;  Laterality: N/A;   Family History  Problem Relation Age of Onset   HIV Mother    Hypertension Mother    Hypertension Other    Hypertension Maternal Grandmother    Hypertension Paternal Grandmother    Alcohol abuse Neg Hx    Cancer Neg Hx    Early death Neg Hx    Heart disease Neg Hx    Hyperlipidemia Neg Hx    Kidney disease Neg Hx    Stroke Neg Hx    Heart attack Neg Hx    Social History   Tobacco Use   Smoking status: Never   Smokeless tobacco: Never  Substance Use Topics   Alcohol use: No  No Known Allergies  Prior to Admission medications   Medication Sig Start Date End Date Taking? Authorizing Provider  acetaminophen (TYLENOL) 650 MG CR tablet Take 650 mg by mouth every 8 (eight) hours as needed for pain.   Yes [provider]  albuterol (VENTOLIN HFA) 108 (90 Base) MCG/ACT inhaler Inhale 2 puffs into the lungs every 6 (six) hours as needed for wheezing or shortness of breath. 01/21/21  Yes Margarita Mail, DO  amLODipine (NORVASC) 10 MG tablet Take 1 tablet by mouth once daily 03/30/22  Yes Jimy Gates, Inetta Fermo A, FNP  carvedilol (COREG) 25 MG tablet TAKE 1 TABLET BY MOUTH TWICE DAILY WITH MEALS 04/15/22  Yes Clarisa Kindred A, FNP  empagliflozin (JARDIANCE) 10 MG TABS tablet Take 1 tablet (10 mg total) by mouth daily before breakfast. 01/27/22  Yes Clarisa Kindred A, FNP  furosemide (LASIX) 40 MG tablet Take 1 tablet (40 mg total) by mouth daily. 03/02/22  Yes Danilyn Cocke A, FNP  sacubitril-valsartan (ENTRESTO) 97-103 MG Take 1 tablet by mouth 2 (two) times daily. 01/27/22  Yes Delma Freeze, FNP  metFORMIN (GLUCOPHAGE) 500 MG tablet Take 1 tablet by mouth once daily with breakfast Patient not taking: Reported on 05/06/2022 02/11/22   Margarita Mail, DO    Review of Systems  Constitutional:  Positive for fatigue. Negative for appetite change.  HENT:  Positive for postnasal drip. Negative for congestion and sore throat.   Eyes:  Negative for pain and visual disturbance.  Respiratory:  Positive for apnea, cough and shortness of breath (minimal). Negative for chest tightness and wheezing.        + snoring  Cardiovascular:  Negative for chest pain, palpitations and leg swelling.  Gastrointestinal:  Negative for abdominal distention, abdominal pain, constipation, diarrhea and nausea.  Endocrine: Negative.   Genitourinary:  Negative for difficulty urinating.  Musculoskeletal:  Positive for arthralgias (left great toe). Negative for back pain and neck pain.  Skin: Negative.   Allergic/Immunologic: Negative.   Neurological:  Negative for dizziness, weakness, light-headedness and numbness.  Hematological:  Negative for adenopathy. Does not bruise/bleed easily.  Psychiatric/Behavioral:  Negative for dysphoric mood, sleep disturbance and suicidal ideas. The patient is not nervous/anxious.    Vitals:   05/06/22 0913  BP: 114/76  Pulse: 80  Resp: 14  SpO2: 98%  Weight: (!) 346 lb (156.9 kg)   Wt Readings from Last 3 Encounters:  05/06/22 (!) 346 lb (156.9 kg)  04/08/22 (!) 348 lb (157.9 kg)  03/02/22 (!) 357 lb (161.9 kg)   Lab Results  Component Value Date   CREATININE 1.34 (H) 04/08/2022   CREATININE 1.47 (H) 12/22/2021   CREATININE 1.33 (H) 11/18/2021   Physical Exam Vitals and nursing note reviewed.  Constitutional:       Appearance: He is well-developed. He is obese.  HENT:     Head: Normocephalic and atraumatic.  Neck:     Vascular: No JVD.  Cardiovascular:     Rate and Rhythm: Normal rate and regular rhythm.  Pulmonary:     Effort: Pulmonary effort is normal.     Breath sounds: No wheezing or rales.  Abdominal:     General: There is no distension.     Palpations: Abdomen is soft.     Tenderness: There is no abdominal tenderness.  Musculoskeletal:        General: No tenderness.     Cervical back: Normal range of motion and neck supple.     Right lower leg: Edema (  trace pitting) present.     Left lower leg: Edema (trace pitting) present.  Skin:    General: Skin is warm and dry.  Neurological:     General: No focal deficit present.     Mental Status: He is alert and oriented to person, place, and time.  Psychiatric:        Mood and Affect: Mood normal.        Behavior: Behavior normal.        Thought Content: Thought content normal.    Assessment & Plan:  1: NICM with mildly reduced ejection fraction- - NYHA class II - euvolemic - weighing daily; understands to call for an overnight weight gain of > 2 pounds or a weekly weight gain of . 5 pounds - weight down 2 pounds from last visit here 1 month ago (total 20 pounds weight loss in last 4 months) - says that he's been walking "a lot more" since he's been unemployed; encouraged him to keep this up - echo 08/07/21: EF of 40%-45%. Echo 08/25/16: EF of 60-65%. Echo 02/14/16: EF of 35-40% along with mild Noah/TR.   - drinking 2 cases of water every week - not adding any salt to his food & really trying to watch his sodium intake if he eats out - continue carvedilol  BID - continue jardiance  daily - continue entresto 97/103mg  BID - continue furosemide  daily - begin spironolactone  daily - BMP in 1 week & then again at next visit - pro-BNP on 11/04/19 was 82   2: HTN- - BP 114/76 - stop amlodipine since starting spironolactone -  EKG today: NSR w/ 1st degree AV block - had video visit with PCP Caralee Ates) 03/23; advised him to r/s this appt - BMP 04/08/22 reviewed and showed Na 137, K 4.0, GFR >60 and Scr 1.34  3: Snoring- - NS for pulmonology 04/08/21 - Itamar home sleep study ordered  4: DM- - A1c 11/18/21 was 6.0% - discuss ozempic at next visit   Return in 1 week for labs and then an OV in 1 month.

## 2022-05-06 ENCOUNTER — Ambulatory Visit: Payer: 59 | Attending: Family | Admitting: Family

## 2022-05-06 ENCOUNTER — Encounter: Payer: Self-pay | Admitting: Family

## 2022-05-06 VITALS — BP 114/76 | HR 80 | Resp 14 | Ht 67.0 in | Wt 346.0 lb

## 2022-05-06 DIAGNOSIS — M79675 Pain in left toe(s): Secondary | ICD-10-CM | POA: Diagnosis not present

## 2022-05-06 DIAGNOSIS — J45909 Unspecified asthma, uncomplicated: Secondary | ICD-10-CM | POA: Insufficient documentation

## 2022-05-06 DIAGNOSIS — Z6841 Body Mass Index (BMI) 40.0 and over, adult: Secondary | ICD-10-CM | POA: Insufficient documentation

## 2022-05-06 DIAGNOSIS — I10 Essential (primary) hypertension: Secondary | ICD-10-CM

## 2022-05-06 DIAGNOSIS — R0683 Snoring: Secondary | ICD-10-CM | POA: Diagnosis not present

## 2022-05-06 DIAGNOSIS — Z7984 Long term (current) use of oral hypoglycemic drugs: Secondary | ICD-10-CM | POA: Diagnosis not present

## 2022-05-06 DIAGNOSIS — E119 Type 2 diabetes mellitus without complications: Secondary | ICD-10-CM | POA: Diagnosis not present

## 2022-05-06 DIAGNOSIS — Z79899 Other long term (current) drug therapy: Secondary | ICD-10-CM | POA: Diagnosis not present

## 2022-05-06 DIAGNOSIS — I428 Other cardiomyopathies: Secondary | ICD-10-CM | POA: Insufficient documentation

## 2022-05-06 DIAGNOSIS — I5022 Chronic systolic (congestive) heart failure: Secondary | ICD-10-CM | POA: Insufficient documentation

## 2022-05-06 DIAGNOSIS — I440 Atrioventricular block, first degree: Secondary | ICD-10-CM | POA: Diagnosis not present

## 2022-05-06 DIAGNOSIS — I110 Hypertensive heart disease with heart failure: Secondary | ICD-10-CM | POA: Insufficient documentation

## 2022-05-06 DIAGNOSIS — R0602 Shortness of breath: Secondary | ICD-10-CM | POA: Insufficient documentation

## 2022-05-06 DIAGNOSIS — M109 Gout, unspecified: Secondary | ICD-10-CM | POA: Insufficient documentation

## 2022-05-06 DIAGNOSIS — G4733 Obstructive sleep apnea (adult) (pediatric): Secondary | ICD-10-CM | POA: Diagnosis not present

## 2022-05-06 MED ORDER — SPIRONOLACTONE 25 MG PO TABS: 25.0000 mg | ORAL_TABLET | Freq: Every day | ORAL | 5 refills | Status: DC

## 2022-05-06 NOTE — Patient Instructions (Addendum)
Call [employer] patient assistance to let them know you no longer receive unemployment.   Call your primary care provider and get an appointment scheduled.   Stop taking amlodipine. Begin spironolactone as 1 tablet every day.   Go to the lab next Friday to get your lab work drawn. You can go to the lab in the Urgent Care building in [place] or come to the [facility].     Your provider has recommended that you have a home sleep study (Itamar Test).  We have provided you with the equipment in our office today. Please go ahead and download the app. DO NOT OPEN OR TAMPER WITH THE BOX UNTIL WE ADVISE YOU TO DO SO. Once insurance has approved the test our office will call you with PIN number and approval to proceed with testing. Once you have completed the test you just dispose of the equipment, the information is automatically uploaded to [place] via blue-tooth technology. If your test is positive for sleep apnea and you need a home CPAP machine you will be contacted by Dr Norris Cross office [facility]) to set this up.  Your insurance does not require prior approval for your home sleep study. You can take your sleep study as soon as possible. Your PIN number will be [id number]

## 2022-05-06 NOTE — Progress Notes (Signed)
ITAMAR home sleep study given to patient, all instructions explained, waiver signed, and CLOUDPAT registration complete.  

## 2022-05-06 NOTE — Progress Notes (Signed)
Height:  5'7"    Weight: 346 lb BMI: 54.19   Today's Date: 05/06/22  STOP BANG RISK ASSESSMENT S (snore) Have you been told that you snore?     YES  T (tired) Are you often tired, fatigued, or sleepy during the day?   YES  O (obstruction) Do you stop breathing, choke, or gasp during sleep? YES   P (pressure) Do you have or are you being treated for high blood pressure? YES   B (BMI) Is your body index greater than 35 kg/m? YES   A (age) Are you 47 years old or older? NO   N (neck) Do you have a neck circumference greater than 16 inches?   YES   G (gender) Are you a male? YES   TOTAL STOP/BANG "YES" ANSWERS 7                                                                       For Office Use Only              Procedure Order Form    YES to 3+ Stop Bang questions OR two clinical symptoms - patient qualifies for WatchPAT (CPT 95800)      Clinical Notes: Will consult Sleep Specialist and refer for management of therapy due to patient increased risk of Sleep Apnea. Ordering a sleep study due to the following two clinical symptoms:  Loud snoring R06.83 / History of high blood pressure R03.0

## 2022-05-08 ENCOUNTER — Encounter (INDEPENDENT_AMBULATORY_CARE_PROVIDER_SITE_OTHER): Payer: 59 | Admitting: Cardiology

## 2022-05-08 DIAGNOSIS — G4733 Obstructive sleep apnea (adult) (pediatric): Secondary | ICD-10-CM

## 2022-05-09 ENCOUNTER — Ambulatory Visit: Payer: 59 | Attending: Family

## 2022-05-09 DIAGNOSIS — G4733 Obstructive sleep apnea (adult) (pediatric): Secondary | ICD-10-CM

## 2022-05-09 NOTE — Procedures (Signed)
Patient Information Study Date: 05/08/2022 Patient Name: Noah Rodriguez Patient ID: [id number] Birth Date: 1975/05/09 Age: 47 Gender: Male BMI: 54.3 (W=346 lb, H=5' 7'') Stopbang: 7 Referring Physician: Clarisa Kindred, NP  TEST DESCRIPTION: Home sleep apnea testing was completed using the WatchPat, a Type 1 device, utilizing peripheral arterial tonometry (PAT), chest movement, actigraphy, pulse oximetry, pulse rate, body position and snore.  AHI was calculated with apnea and hypopnea using valid sleep time as the denominator. RDI includes apneas, hypopneas, and RERAs.  The data acquired and the scoring of sleep and all associated events were performed in accordance with the recommended standards and specifications as outlined in the AASM Manual for the Scoring of Sleep and Associated Events 2.2.0 (2015).  FINDINGS:  1.  Severe Obstructive Sleep Apnea with AHI 72.6/hr.   2.  Mild Central Sleep Apnea with pAHIc 15.6/hr.  3.  Oxygen desaturations as low as 65%.  4.  Moderate snoring was present. O2 sats were < 88% for 82.6 min.  5.  Total sleep time was 5 hrs and 30 min.  6.  11.2% of total sleep time was spent in REM sleep.   7.  Normal sleep onset latency at 10 min.   8.  Normal REM sleep onset latency at 90 min.   9.  Total awakenings were 12.  10. Arrhythmia detection:  Suggestive of possible brief atrial fibrillation lasting 59 seconds.  This is not diagnostic and further testing with outpatient telemetry monitoring is recommended.  DIAGNOSIS:   Severe Obstructive Sleep Apnea (G47.33) Nocturnal Hypoxemia Possible Atrial Fibrillation  RECOMMENDATIONS:   1.  Clinical correlation of these findings is necessary.  The decision to treat obstructive sleep apnea (OSA) is usually based on the presence of apnea symptoms or the presence of associated medical conditions such as Hypertension, Congestive Heart Failure, Atrial Fibrillation or Obesity.  The most common symptoms of OSA are  snoring, gasping for breath while sleeping, daytime sleepiness and fatigue.   2.  Initiating apnea therapy is recommended given the presence of symptoms and/or associated conditions. Recommend proceeding with one of the following:     a.  Auto-CPAP therapy with a pressure range of 5-20cm H2O.     b.  An oral appliance (OA) that can be obtained from certain dentists with expertise in sleep medicine.  These are primarily of use in non-obese patients with mild and moderate disease.     c.  An ENT consultation which may be useful to look for specific causes of obstruction and possible treatment options.     d.  If patient is intolerant to PAP therapy, consider referral to ENT for evaluation for hypoglossal nerve stimulator.   3.  Close follow-up is necessary to ensure success with CPAP or oral appliance therapy for maximum benefit.  4.  A follow-up oximetry study on CPAP is recommended to assess the adequacy of therapy and determine the need for supplemental oxygen or the potential need for Bi-level therapy.  An arterial blood gas to determine the adequacy of baseline ventilation and oxygenation should also be considered.  5.  Healthy sleep recommendations include:  adequate nightly sleep (normal 7-9 hrs/night), avoidance of caffeine after noon and alcohol near bedtime, and maintaining a sleep environment that is cool, dark and quiet.  6.  Weight loss for overweight patients is recommended.  Even modest amounts of weight loss can significantly improve the severity of sleep apnea.  7.  Snoring recommendations include:  weight loss where appropriate, side sleeping,  and avoidance of alcohol before bed.  8.  Operation of motor vehicle should be avoided when sleepy.  Signature: Armanda Magic, MD; [facility]; Diplomat, American Board of Sleep Medicine Electronically Signed: 05/09/2022 8:11:21 PM

## 2022-05-21 ENCOUNTER — Other Ambulatory Visit: Payer: Self-pay

## 2022-05-21 DIAGNOSIS — G4733 Obstructive sleep apnea (adult) (pediatric): Secondary | ICD-10-CM

## 2022-06-04 ENCOUNTER — Encounter: Payer: 59 | Admitting: Family

## 2022-06-16 ENCOUNTER — Telehealth: Payer: Self-pay | Admitting: *Deleted

## 2022-06-16 DIAGNOSIS — I5022 Chronic systolic (congestive) heart failure: Secondary | ICD-10-CM

## 2022-06-16 DIAGNOSIS — I5032 Chronic diastolic (congestive) heart failure: Secondary | ICD-10-CM

## 2022-06-16 DIAGNOSIS — I10 Essential (primary) hypertension: Secondary | ICD-10-CM

## 2022-06-16 DIAGNOSIS — G4733 Obstructive sleep apnea (adult) (pediatric): Secondary | ICD-10-CM

## 2022-06-16 NOTE — Telephone Encounter (Signed)
The patient has been notified of the result. Left detailed message on voicemail and informed patient to call back..Candy Ziegler Green, CMA   

## 2022-06-16 NOTE — Telephone Encounter (Signed)
-----   Message from Gaynelle Cage, [place] sent at 05/10/2022  7:58 AM EDT -----  ----- Message ----- From: Quintella Reichert, MD Sent: 05/09/2022   8:13 PM EDT To: [facility] Sleep Studies  Please let patient know that they have sleep apnea.  Recommend therapeutic CPAP titration for treatment of patient's sleep disordered breathing.  If unable to perform an in lab titration then initiate ResMed auto CPAP from 4 to 15cm H2O with heated humidity and mask of choice and overnight pulse ox on CPAP.

## 2022-06-17 NOTE — Progress Notes (Deleted)
PCP: Margarita Mail, DO (last seen 03/23) Primary Cardiologist: none  HPI:   Noah Rodriguez is a 47 y/o male with a history of obstructive sleep apnea, obesity, hypokalemia, gout, asthma and chronic heart failure.   Echo 08/07/21: EF of 40%-45%. Echo 08/25/16: EF of 60-65%. Echo 02/14/16: EF of 35-40% along with mild Noah/TR.    Cardiac catheterization was done 09/17/14 and showed an EF of 20-25% at that time. No CAD present.   Has not been admitted or been in the ED in the last 6 months.    He presents today for a HF follow-up visit with a chief complaint of minimal SOB with moderate exertion. Chronic in nature. Has fatigue, cough, left great toe pain, snoring and apneic episodes along with this. Denies difficulty sleeping, dizziness, abdominal distention, palpitations, pedal edema, chest pain, wheezing or weight gain.   Has a gout flare in his left great toe. He realizes that if he drinks anything with liquid sugar or processed foods that it will trigger his gout. Yesterday he had a small icee from [employer] and then this morning he woke up with his toe hurting.   Continues to be unemployed although has become quite active. He has lost 20 pounds in the last 4 months.      ROS: All systems negative except as listed in HPI, PMH and Problem List.  SH:  Social History   Socioeconomic History   Marital status: Single    Spouse name: Not on file   Number of children: Not on file   Years of education: Not on file   Highest education level: Not on file  Occupational History   Not on file  Tobacco Use   Smoking status: Never   Smokeless tobacco: Never  Vaping Use   Vaping Use: Never used  Substance and Sexual Activity   Alcohol use: No   Drug use: No   Sexual activity: Yes    Birth control/protection: Condom  Other Topics Concern   Not on file  Social History Narrative   Not on file   Social Determinants of Health   Financial Resource Strain: Low Risk  (02/24/2017)   Overall  Financial Resource Strain (CARDIA)    Difficulty of Paying Living Expenses: Not hard at all  Food Insecurity: No Food Insecurity (02/24/2017)   Hunger Vital Sign    Worried About Running Out of Food in the Last Year: Never true    Ran Out of Food in the Last Year: Never true  Transportation Needs: No Transportation Needs (02/24/2017)   PRAPARE - Administrator, Civil Service (Medical): No    Lack of Transportation (Non-Medical): No  Physical Activity: Sufficiently Active (12/14/2018)   Exercise Vital Sign    Days of Exercise per Week: 3 days    Minutes of Exercise per Session: 150+ min  Stress: Stress Concern Present (02/24/2017)   [employer] of Occupational Health - Occupational Stress Questionnaire    Feeling of Stress : Very much  Social Connections: Somewhat Isolated (02/24/2017)   Social Connection and Isolation Panel [NHANES]    Frequency of Communication with Friends and Family: More than three times a week    Frequency of Social Gatherings with Friends and Family: Once a week    Attends Religious Services: More than 4 times per year    Active Member of [employer] or Organizations: No    Attends Banker Meetings: Never    Marital Status: Never married  Catering manager  Violence: Unknown (02/24/2017)   Humiliation, Afraid, Rape, and Kick questionnaire    Fear of Current or Ex-Partner: Patient declined    Emotionally Abused: Patient declined    Physically Abused: Patient declined    Sexually Abused: Patient declined    FH:  Family History  Problem Relation Age of Onset   HIV Mother    Hypertension Mother    Hypertension Other    Hypertension Maternal Grandmother    Hypertension Paternal Grandmother    Alcohol abuse Neg Hx    Cancer Neg Hx    Early death Neg Hx    Heart disease Neg Hx    Hyperlipidemia Neg Hx    Kidney disease Neg Hx    Stroke Neg Hx    Heart attack Neg Hx     Past Medical History:  Diagnosis Date   Acute kidney injury  (HCC)    Asthma    Chronic combined systolic and diastolic CHF (congestive heart failure) (HCC)    EF < 25% on Cath (09/17/2014)   Gout    Gout of big toe 09/16/2014   H/O medication noncompliance    Hypertension    Hypertensive heart disease    Hypokalemia    MI (myocardial infarction) (HCC)    Patient denies having had a MI   NICM (nonischemic cardiomyopathy) (HCC)    a. cardiac cath 09/2014 with normal coronary arteries, EF 20-25% with severe global HK; b. echo 08/2014: EF 30-35%, false tendon in LV aepx of no clinical sig, diffuse HK, GR1DD, aortic root 40 mm, trivial Noah, trivial pericardial effusion posterior    Obesity, Class III, BMI 40-49.9 (morbid obesity) (HCC)    Sleep apnea    a. sleep study 05/2010; b. noncompliant with CPAP   Ventral hernia    a. incarcerated omentum 06/10/2015    Current Outpatient Medications  Medication Sig Dispense Refill   acetaminophen (TYLENOL) 650 MG CR tablet Take 650 mg by mouth every 8 (eight) hours as needed for pain.     albuterol (VENTOLIN HFA) 108 (90 Base) MCG/ACT inhaler Inhale 2 puffs into the lungs every 6 (six) hours as needed for wheezing or shortness of breath. 8 g 0   carvedilol (COREG) 25 MG tablet TAKE 1 TABLET BY MOUTH TWICE DAILY WITH MEALS 180 tablet 1   empagliflozin (JARDIANCE) 10 MG TABS tablet Take 1 tablet (10 mg total) by mouth daily before breakfast. 90 tablet 3   furosemide (LASIX) 40 MG tablet Take 1 tablet (40 mg total) by mouth daily. 90 tablet 3   metFORMIN (GLUCOPHAGE) 500 MG tablet Take 1 tablet by mouth once daily with breakfast (Patient not taking: Reported on 05/06/2022) 30 tablet 0   sacubitril-valsartan (ENTRESTO) 97-103 MG Take 1 tablet by mouth 2 (two) times daily. 180 tablet 3   spironolactone (ALDACTONE) 25 MG tablet Take 1 tablet (25 mg total) by mouth daily. 30 tablet 5   No current facility-administered medications for this visit.     PHYSICAL EXAM:  General:  Well appearing. No resp difficulty HEENT:  normal Neck: supple. JVP flat. Carotids 2+ bilaterally; no bruits. No lymphadenopathy or thryomegaly appreciated. Cor: PMI normal. Regular rate & rhythm. No rubs, gallops or murmurs. Lungs: clear Abdomen: soft, nontender, nondistended. No hepatosplenomegaly. No bruits or masses. Good bowel sounds. Extremities: no cyanosis, clubbing, rash, edema Neuro: alert & orientedx3, cranial nerves grossly intact. Moves all 4 extremities w/o difficulty. Affect pleasant.   ECG:   ASSESSMENT & PLAN:  1: NICM with mildly  reduced ejection fraction- - NYHA class II - euvolemic - weighing daily; understands to call for an overnight weight gain of > 2 pounds or a weekly weight gain of . 5 pounds - weight down 2 pounds from last visit here 1 month ago (total 20 pounds weight loss in last 4 months) - says that he's been walking "a lot more" since he's been unemployed; encouraged him to keep this up - echo 08/07/21: EF of 40%-45%. Echo 08/25/16: EF of 60-65%. Echo 02/14/16: EF of 35-40% along with mild Noah/TR.   - drinking 2 cases of water every week - not adding any salt to his food & really trying to watch his sodium intake if he eats out - continue carvedilol 25mg  BID - continue jardiance 10mg  daily - continue entresto 97/103mg  BID - continue furosemide 40mg  daily - begin spironolactone 25mg  daily - BMP in 1 week & then again at next visit - pro-BNP on 11/04/19 was 82   2: HTN- - BP 114/76 - stop amlodipine since starting spironolactone - EKG today: NSR w/ 1st degree AV block - had video visit with PCP Caralee Ates) 03/23; advised him to r/s this appt - BMP 04/08/22 reviewed and showed Na 137, K 4.0, GFR >60 and Scr 1.34  3: Snoring- - NS for pulmonology 04/08/21 - [facility] home sleep study ordered  4: DM- - A1c 11/18/21 was 6.0% - discuss ozempic at next visit   Return in 1 week for labs and then an OV in 1 month.

## 2022-06-17 NOTE — Telephone Encounter (Signed)
The patient has been notified of the result. Left detailed message on voicemail and informed patient to call back..Calyx Hawker Green, CMA   

## 2022-06-18 ENCOUNTER — Encounter: Payer: 59 | Admitting: Family

## 2022-06-18 ENCOUNTER — Telehealth: Payer: Self-pay | Admitting: Family

## 2022-06-18 NOTE — Telephone Encounter (Signed)
Patient did not show for his [facility] appointment on 06/18/22

## 2022-06-25 NOTE — Addendum Note (Signed)
Addended by: Reesa Chew on: 06/25/2022 09:33 AM   Modules accepted: Orders

## 2022-06-25 NOTE — Telephone Encounter (Signed)
The patient has been notified of the result and verbalized understanding.  All questions (if any) were answered. Latrelle Dodrill, CMA 06/25/2022 9:31 AM    Will precert titration

## 2022-07-27 ENCOUNTER — Encounter: Payer: 59 | Admitting: Family

## 2022-08-05 ENCOUNTER — Inpatient Hospital Stay (HOSPITAL_COMMUNITY)
Admission: RE | Admit: 2022-08-05 | Discharge: 2022-08-05 | Disposition: A | Discharge status: Home / Self Care | Payer: 59 | Source: Ambulatory Visit | Attending: Cardiology | Admitting: Cardiology

## 2022-08-05 ENCOUNTER — Encounter: Payer: Self-pay | Admitting: Family

## 2022-08-05 ENCOUNTER — Other Ambulatory Visit
Admission: RE | Admit: 2022-08-05 | Discharge: 2022-08-05 | Disposition: A | Discharge status: Home / Self Care | Payer: 59 | Source: Ambulatory Visit | Attending: Family | Admitting: Family

## 2022-08-05 ENCOUNTER — Ambulatory Visit (HOSPITAL_BASED_OUTPATIENT_CLINIC_OR_DEPARTMENT_OTHER): Payer: 59 | Admitting: Family

## 2022-08-05 ENCOUNTER — Other Ambulatory Visit (HOSPITAL_COMMUNITY): Payer: Self-pay | Admitting: Cardiology

## 2022-08-05 VITALS — BP 118/75 | HR 67 | Resp 16 | Wt 338.1 lb

## 2022-08-05 DIAGNOSIS — I499 Cardiac arrhythmia, unspecified: Secondary | ICD-10-CM

## 2022-08-05 DIAGNOSIS — G4733 Obstructive sleep apnea (adult) (pediatric): Secondary | ICD-10-CM | POA: Diagnosis not present

## 2022-08-05 DIAGNOSIS — E119 Type 2 diabetes mellitus without complications: Secondary | ICD-10-CM | POA: Diagnosis not present

## 2022-08-05 DIAGNOSIS — I5022 Chronic systolic (congestive) heart failure: Secondary | ICD-10-CM | POA: Insufficient documentation

## 2022-08-05 DIAGNOSIS — I10 Essential (primary) hypertension: Secondary | ICD-10-CM | POA: Diagnosis not present

## 2022-08-05 LAB — BASIC METABOLIC PANEL
Anion gap: 9 (ref 5–15)
BUN: 37 mg/dL — ABNORMAL HIGH (ref 6–20)
CO2: 22 mmol/L (ref 22–32)
Calcium: 8.8 mg/dL — ABNORMAL LOW (ref 8.9–10.3)
Chloride: 104 mmol/L (ref 98–111)
Creatinine, Ser: 1.96 mg/dL — ABNORMAL HIGH (ref 0.61–1.24)
GFR, Estimated: 42 mL/min — ABNORMAL LOW (ref >60)
Glucose, Bld: 102 mg/dL — ABNORMAL HIGH (ref 70–99)
Potassium: 3.8 mmol/L (ref 3.5–5.1)
Sodium: 135 mmol/L (ref 135–145)

## 2022-08-05 NOTE — Telephone Encounter (Signed)
Prior Authorization for TITRATION sent to [employer] via web portal. Tracking Number . APPROVED-Start Date:07/20/2022 -Expiration Date: 01/18/2023

## 2022-08-05 NOTE — Patient Instructions (Addendum)
[facility] to get your [facility] work done.   Good to see you today, keep up the great work!

## 2022-08-05 NOTE — Progress Notes (Signed)
PCP: Margarita Mail, DO (last seen 03/23) Primary Cardiologist: None  HPI:  Noah Rodriguez is a 47 y/o male with a history of obstructive sleep apnea, obesity, hypokalemia, gout, asthma and chronic heart failure.   Has not been admitted or been in the ED in the last 6 months.   Echo 02/14/16: EF of 35-40% along with mild Noah/TR.  Echo 08/25/16: EF of 60-65%.  Echo 08/07/21: EF of 40%-45%.   Cardiac catheterization was done 09/17/14 and showed an EF of 20-25% at that time. No CAD present.   He presents today for a HF follow-up visit with a chief complaint of right sided chest pain with radiation down the right arm ~ 1 week ago. Has associated occasional palpitations, minimal SOB with moderate exertion and slight pedal edema along with this. Denies cough, abdominal distention, dizziness or difficulty sleeping.   At last visit, spironolactone 25mg  daily was started and amlodipine was stopped. Had not returned nor gotten his lab work drawn since spironolactone initiation because his brother got sick and subsequently died.   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 Heard about his sleep study results but is unsure of what the next step is.   Ran out of metformin and it didn't have any refills on it. Does not have a PCP yet.    ROS: All systems negative except as listed in HPI, PMH and Problem List.  SH:  Social History   Socioeconomic History   Marital status: Single    Spouse name: Not on file   Number of children: Not on file   Years of education: Not on file   Highest education level: Not on file  Occupational History   Not on file  Tobacco Use   Smoking status: Never   Smokeless tobacco: Never  Vaping Use   Vaping status: Never Used  Substance and Sexual Activity   Alcohol use: No   Drug use: No   Sexual activity: Yes    Birth control/protection: Condom  Other Topics Concern   Not on file  Social History Narrative   Not on file   Social  Determinants of Health   Financial Resource Strain: Low Risk  (02/24/2017)   Overall Financial Resource Strain (CARDIA)    Difficulty of Paying Living Expenses: Not hard at all  Food Insecurity: No Food Insecurity (02/24/2017)   Hunger Vital Sign    Worried About Running Out of Food in the Last Year: Never true    Ran Out of Food in the Last Year: Never true  Transportation Needs: No Transportation Needs (02/24/2017)   PRAPARE - Administrator, Civil Service (Medical): No    Lack of Transportation (Non-Medical): No  Physical Activity: Sufficiently Active (12/14/2018)   Exercise Vital Sign    Days of Exercise per Week: 3 days    Minutes of Exercise per Session: 150+ min  Stress: Stress Concern Present (02/24/2017)   [employer] of Occupational Health - Occupational Stress Questionnaire    Feeling of Stress : Very much  Social Connections: Unknown (05/26/2021)   Received from [facility]   Social Network    Social Network: Not on file  Intimate Partner Violence: Unknown (04/17/2021)   Received from [facility]   HITS    Physically Hurt: Not on file    Insult or Talk Down To: Not on file    Threaten Physical Harm: Not on file    Scream or Curse: Not on file  FH:  Family History  Problem Relation Age of Onset   HIV Mother    Hypertension Mother    Hypertension Other    Hypertension Maternal Grandmother    Hypertension Paternal Grandmother    Alcohol abuse Neg Hx    Cancer Neg Hx    Early death Neg Hx    Heart disease Neg Hx    Hyperlipidemia Neg Hx    Kidney disease Neg Hx    Stroke Neg Hx    Heart attack Neg Hx     Past Medical History:  Diagnosis Date   Acute kidney injury (HCC)    Asthma    Chronic combined systolic and diastolic CHF (congestive heart failure) (HCC)    EF < 25% on Cath (09/17/2014)   Gout    Gout of big toe 09/16/2014   H/O medication noncompliance    Hypertension    Hypertensive heart disease    Hypokalemia    MI (myocardial  infarction) (HCC)    Patient denies having had a MI   NICM (nonischemic cardiomyopathy) (HCC)    a. cardiac cath 09/2014 with normal coronary arteries, EF 20-25% with severe global HK; b. echo 08/2014: EF 30-35%, false tendon in LV aepx of no clinical sig, diffuse HK, GR1DD, aortic root 40 mm, trivial Noah, trivial pericardial effusion posterior    Obesity, Class III, BMI 40-49.9 (morbid obesity) (HCC)    Sleep apnea    a. sleep study 05/2010; b. noncompliant with CPAP   Ventral hernia    a. incarcerated omentum 06/10/2015    Current Outpatient Medications  Medication Sig Dispense Refill   acetaminophen (TYLENOL) 650 MG CR tablet Take 650 mg by mouth every 8 (eight) hours as needed for pain.     albuterol (VENTOLIN HFA) 108 (90 Base) MCG/ACT inhaler Inhale 2 puffs into the lungs every 6 (six) hours as needed for wheezing or shortness of breath. 8 g 0   carvedilol (COREG) 25 MG tablet TAKE 1 TABLET BY MOUTH TWICE DAILY WITH MEALS 180 tablet 1   empagliflozin (JARDIANCE) 10 MG TABS tablet Take 1 tablet (10 mg total) by mouth daily before breakfast. 90 tablet 3   furosemide (LASIX) 40 MG tablet Take 1 tablet (40 mg total) by mouth daily. 90 tablet 3   metFORMIN (GLUCOPHAGE) 500 MG tablet Take 1 tablet by mouth once daily with breakfast (Patient not taking: Reported on 05/06/2022) 30 tablet 0   sacubitril-valsartan (ENTRESTO) 97-103 MG Take 1 tablet by mouth 2 (two) times daily. 180 tablet 3   spironolactone (ALDACTONE) 25 MG tablet Take 1 tablet (25 mg total) by mouth daily. 30 tablet 5   No current facility-administered medications for this visit.   Vitals:   08/05/22 0829  BP: 118/75  Pulse: 67  Resp: 16  SpO2: 97%  Weight: (!) 338 lb 2 oz (153.4 kg)   Wt Readings from Last 3 Encounters:  08/05/22 (!) 338 lb 2 oz (153.4 kg)  05/06/22 (!) 346 lb (156.9 kg)  04/08/22 (!) 348 lb (157.9 kg)   Lab Results  Component Value Date   CREATININE 1.34 (H) 04/08/2022   CREATININE 1.47 (H)  12/22/2021   CREATININE 1.33 (H) 11/18/2021   PHYSICAL EXAM:  General:  Well appearing. No resp difficulty HEENT: normal Neck: supple. JVP flat. No lymphadenopathy or thryomegaly appreciated. Cor: PMI normal. Regular rate & rhythm. No rubs, gallops or murmurs. Lungs: clear Abdomen: soft, nontender, nondistended. No hepatosplenomegaly. No bruits or masses.  Extremities: no cyanosis, clubbing,  rash, trace pitting edema in left lower leg Neuro: alert & orientedx3, cranial nerves grossly intact. Moves all 4 extremities w/o difficulty. Affect pleasant.   ECG: SB with 1st degree AV block, HR 59   ASSESSMENT & PLAN:  1: NICM with mildly reduced ejection fraction- - suspect this is due to untreated OSA - NYHA class II - euvolemic - weighing daily; understands to call for an overnight weight gain of > 2 pounds or a weekly weight gain of . 5 pounds - weight down 8 pounds from last visit here 3 months ago   - Echo 02/14/16: EF of 35-40% along with mild Noah/TR.  - Echo 08/25/16: EF of 60-65%. - echo 08/07/21: EF of 40%-45%.    - drinking 2 cases of water every week as he sweats a lot at work; now driving a forklift in [place]  - not adding any salt to his food & really trying to watch his sodium intake if he eats out - continue carvedilol 25mg  BID; discussed decreasing this but he is feeling so good that he prefers to leave it as it - continue jardiance 10mg  daily - continue entresto 97/103mg  BID - continue furosemide 40mg  daily; discussed decreasing this but he'd rather not because he feels so good - continue spironolactone 25mg  daily - EKG today shows SB - BMP today as he hasn't gotten it done since starting spironolactone - pro-BNP on 11/04/19 was 82   2: HTN- - BP 118/75 - had video visit with PCP Caralee Ates) 03/23; needs to get f/u appt scheduled - BMP 04/08/22 reviewed and showed Na 137, K 4.0, GFR >60 and Scr 1.34  3: OSA- - NS for pulmonology 04/08/21 - Severe Obstructive Sleep  Apnea with AHI 72.6/hr.  - Mild Central Sleep Apnea with pAHIc 15.6/hr. - waiting on sleep lab appt - sleep study showed questionable atrial fibrillation; 14 day zio placed today  4: DM- - A1c 11/18/21 was 6.0% - ran out of refills on his metformin - A1c today; may not need metformin since he's lost so much weight and now drinks very little soda

## 2022-08-06 ENCOUNTER — Other Ambulatory Visit: Payer: Self-pay

## 2022-08-06 DIAGNOSIS — I5022 Chronic systolic (congestive) heart failure: Secondary | ICD-10-CM

## 2022-08-06 MED ORDER — FUROSEMIDE 40 MG PO TABS: 20.0000 mg | ORAL_TABLET | Freq: Every day | ORAL | 3 refills | Status: DC

## 2022-08-06 NOTE — Progress Notes (Addendum)
  Pt aware, agreeable, and verbalized understanding. Lab work order placed.   Kidney function has worsened. Decrease the furosemide to 1/2 tablet daily. Still waiting on A1c results. Recheck BMP in 2 weeks.  Associated with: BASIC METABOLI

## 2022-08-10 ENCOUNTER — Telehealth: Payer: Self-pay

## 2022-08-10 NOTE — Telephone Encounter (Signed)
Pt called stating his job occupation causes him to sweat excessively and he had to take his monitor off after wearing it for 2 days because it would no longer stick. Advised pt to return the monitor through the mail for data evaluation.

## 2022-09-14 NOTE — Addendum Note (Signed)
Encounter addended by: Crissie Figures, RN on: 09/14/2022 10:59 AM  Actions taken: Imaging Exam ended

## 2022-09-14 NOTE — Addendum Note (Signed)
Encounter addended by: Crissie Figures, RN on: 09/14/2022 11:00 AM  Actions taken: Imaging Exam ended

## 2022-09-15 ENCOUNTER — Ambulatory Visit (HOSPITAL_BASED_OUTPATIENT_CLINIC_OR_DEPARTMENT_OTHER): Payer: 59 | Attending: Cardiology | Admitting: Cardiology

## 2022-09-27 ENCOUNTER — Encounter (HOSPITAL_BASED_OUTPATIENT_CLINIC_OR_DEPARTMENT_OTHER): Payer: 59 | Admitting: Cardiology

## 2022-10-06 ENCOUNTER — Encounter: Payer: 59 | Admitting: Family

## 2022-10-07 NOTE — Progress Notes (Deleted)
PCP: Margarita Mail, DO (last seen 03/23) Primary Cardiologist: None  HPI:  Noah Rodriguez is a 47 y/o male with a history of obstructive sleep apnea, obesity, hypokalemia, gout, asthma and chronic heart failure.   Has not been admitted or been in the ED in the last 6 months.   Echo 02/14/16: EF of 35-40% along with mild Noah/TR.  Echo 08/25/16: EF of 60-65%.  Echo 08/07/21: EF of 40%-45%.   Cardiac catheterization was done 09/17/14 and showed an EF of 20-25% at that time. No CAD present.   He presents today for a HF follow-up visit with a chief complaint of    At last visit, labs were done with worsening renal function so furosemide was decreased to 20mg  daily.    ROS: All systems negative except as listed in HPI, PMH and Problem List.  SH:  Social History   Socioeconomic History   Marital status: Single    Spouse name: Not on file   Number of children: Not on file   Years of education: Not on file   Highest education level: Not on file  Occupational History   Not on file  Tobacco Use   Smoking status: Never   Smokeless tobacco: Never  Vaping Use   Vaping status: Never Used  Substance and Sexual Activity   Alcohol use: No   Drug use: No   Sexual activity: Yes    Birth control/protection: Condom  Other Topics Concern   Not on file  Social History Narrative   Not on file   Social Determinants of Health   Financial Resource Strain: Low Risk  (02/24/2017)   Overall Financial Resource Strain (CARDIA)    Difficulty of Paying Living Expenses: Not hard at all  Food Insecurity: No Food Insecurity (02/24/2017)   Hunger Vital Sign    Worried About Running Out of Food in the Last Year: Never true    Ran Out of Food in the Last Year: Never true  Transportation Needs: No Transportation Needs (02/24/2017)   PRAPARE - Administrator, Civil Service (Medical): No    Lack of Transportation (Non-Medical): No  Physical Activity: Sufficiently Active (12/14/2018)   Exercise  Vital Sign    Days of Exercise per Week: 3 days    Minutes of Exercise per Session: 150+ min  Stress: Stress Concern Present (02/24/2017)   [employer] of Occupational Health - Occupational Stress Questionnaire    Feeling of Stress : Very much  Social Connections: Unknown (05/26/2021)   Received from [facility], [facility]   Social Network    Social Network: Not on file  Intimate Partner Violence: Unknown (04/17/2021)   Received from [facility], [facility]   HITS    Physically Hurt: Not on file    Insult or Talk Down To: Not on file    Threaten Physical Harm: Not on file    Scream or Curse: Not on file    FH:  Family History  Problem Relation Age of Onset   HIV Mother    Hypertension Mother    Hypertension Other    Hypertension Maternal Grandmother    Hypertension Paternal Grandmother    Alcohol abuse Neg Hx    Cancer Neg Hx    Early death Neg Hx    Heart disease Neg Hx    Hyperlipidemia Neg Hx    Kidney disease Neg Hx    Stroke Neg Hx    Heart attack Neg Hx     Past  Medical History:  Diagnosis Date   Acute kidney injury (HCC)    Asthma    Chronic combined systolic and diastolic CHF (congestive heart failure) (HCC)    EF < 25% on Cath (09/17/2014)   Gout    Gout of big toe 09/16/2014   H/O medication noncompliance    Hypertension    Hypertensive heart disease    Hypokalemia    MI (myocardial infarction) [facility])    Patient denies having had a MI   NICM (nonischemic cardiomyopathy) (HCC)    a. cardiac cath 09/2014 with normal coronary arteries, EF 20-25% with severe global HK; b. echo 08/2014: EF 30-35%, false tendon in LV aepx of no clinical sig, diffuse HK, GR1DD, aortic root 40 mm, trivial Noah, trivial pericardial effusion posterior    Obesity, Class III, BMI 40-49.9 (morbid obesity) (HCC)    Sleep apnea    a. sleep study 05/2010; b. noncompliant with CPAP   Ventral hernia    a. incarcerated omentum 06/10/2015    Current Outpatient Medications   Medication Sig Dispense Refill   acetaminophen (TYLENOL) 650 MG CR tablet Take 650 mg by mouth every 8 (eight) hours as needed for pain.     albuterol (VENTOLIN HFA) 108 (90 Base) MCG/ACT inhaler Inhale 2 puffs into the lungs every 6 (six) hours as needed for wheezing or shortness of breath. 8 g 0   carvedilol (COREG) 25 MG tablet TAKE 1 TABLET BY MOUTH TWICE DAILY WITH MEALS 180 tablet 1   empagliflozin (JARDIANCE) 10 MG TABS tablet Take 1 tablet (10 mg total) by mouth daily before breakfast. 90 tablet 3   furosemide (LASIX) 40 MG tablet Take 0.5 tablets (20 mg total) by mouth daily. 90 tablet 3   metFORMIN (GLUCOPHAGE) 500 MG tablet Take 1 tablet by mouth once daily with breakfast (Patient not taking: Reported on 05/06/2022) 30 tablet 0   Multiple Vitamin (MULTIVITAMIN) tablet Take 1 tablet by mouth daily. [employer] One daily Multi Vitamin     sacubitril-valsartan (ENTRESTO) 97-103 MG Take 1 tablet by mouth 2 (two) times daily. 180 tablet 3   spironolactone (ALDACTONE) 25 MG tablet Take 1 tablet (25 mg total) by mouth daily. 30 tablet 5   No current facility-administered medications for this visit.     PHYSICAL EXAM:  General:  Well appearing. No resp difficulty HEENT: normal Neck: supple. JVP flat. No lymphadenopathy or thryomegaly appreciated. Cor: PMI normal. Regular rate & rhythm. No rubs, gallops or murmurs. Lungs: clear Abdomen: soft, nontender, nondistended. No hepatosplenomegaly. No bruits or masses.  Extremities: no cyanosis, clubbing, rash, trace pitting edema in left lower leg Neuro: alert & orientedx3, cranial nerves grossly intact. Moves all 4 extremities w/o difficulty. Affect pleasant.   ECG: SB with 1st degree AV block, HR 59   ASSESSMENT & PLAN:  1: NICM with mildly reduced ejection fraction- - suspect this is due to untreated OSA - NYHA class II - euvolemic - weighing daily; understands to call for an overnight weight gain of > 2 pounds or a weekly  weight gain of . 5 pounds - weight 338.2 pounds from last visit here 2 months ago   - Echo 02/14/16: EF of 35-40% along with mild Noah/TR.  - Echo 08/25/16: EF of 60-65%. - echo 08/07/21: EF of 40%-45%.    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  - Mild Central Sleep Apnea with pAHIc 15.6/hr. - waiting on sleep lab appt - sleep study showed questionable atrial fibrillation; 14 day zio placed today  4: DM- - A1c 08/05/22 was 6.4%

## 2022-10-08 ENCOUNTER — Encounter: Payer: 59 | Admitting: Family

## 2022-10-13 NOTE — Progress Notes (Deleted)
PCP: Margarita Mail, DO (last seen 03/23) Primary Cardiologist: None  HPI:  Noah Rodriguez is a 47 y/o male with a history of obstructive sleep apnea, obesity, hypokalemia, gout, asthma and chronic heart failure.   Has not been admitted or been in the ED in the last 6 months.   Echo 02/14/16: EF of 35-40% along with mild Noah/TR.  Echo 08/25/16: EF of 60-65%.  Echo 08/07/21: EF of 40%-45%.   Cardiac catheterization was done 09/17/14 and showed an EF of 20-25% at that time. No CAD present.   He presents today for a HF follow-up visit with a chief complaint of    At last visit, labs were done with worsening renal function so furosemide was decreased to 20mg  daily.    ROS: All systems negative except as listed in HPI, PMH and Problem List.  SH:  Social History   Socioeconomic History   Marital status: Single    Spouse name: Not on file   Number of children: Not on file   Years of education: Not on file   Highest education level: Not on file  Occupational History   Not on file  Tobacco Use   Smoking status: Never   Smokeless tobacco: Never  Vaping Use   Vaping status: Never Used  Substance and Sexual Activity   Alcohol use: No   Drug use: No   Sexual activity: Yes    Birth control/protection: Condom  Other Topics Concern   Not on file  Social History Narrative   Not on file   Social Determinants of Health   Financial Resource Strain: Low Risk  (02/24/2017)   Overall Financial Resource Strain (CARDIA)    Difficulty of Paying Living Expenses: Not hard at all  Food Insecurity: No Food Insecurity (02/24/2017)   Hunger Vital Sign    Worried About Running Out of Food in the Last Year: Never true    Ran Out of Food in the Last Year: Never true  Transportation Needs: No Transportation Needs (02/24/2017)   PRAPARE - Administrator, Civil Service (Medical): No    Lack of Transportation (Non-Medical): No  Physical Activity: Sufficiently Active (12/14/2018)   Exercise  Vital Sign    Days of Exercise per Week: 3 days    Minutes of Exercise per Session: 150+ min  Stress: Stress Concern Present (02/24/2017)   [employer] of Occupational Health - Occupational Stress Questionnaire    Feeling of Stress : Very much  Social Connections: Unknown (05/26/2021)   Received from [facility], [facility]   Social Network    Social Network: Not on file  Intimate Partner Violence: Unknown (04/17/2021)   Received from [facility], [facility]   HITS    Physically Hurt: Not on file    Insult or Talk Down To: Not on file    Threaten Physical Harm: Not on file    Scream or Curse: Not on file    FH:  Family History  Problem Relation Age of Onset   HIV Mother    Hypertension Mother    Hypertension Other    Hypertension Maternal Grandmother    Hypertension Paternal Grandmother    Alcohol abuse Neg Hx    Cancer Neg Hx    Early death Neg Hx    Heart disease Neg Hx    Hyperlipidemia Neg Hx    Kidney disease Neg Hx    Stroke Neg Hx    Heart attack Neg Hx     Past  Medical History:  Diagnosis Date   Acute kidney injury (HCC)    Asthma    Chronic combined systolic and diastolic CHF (congestive heart failure) (HCC)    EF < 25% on Cath (09/17/2014)   Gout    Gout of big toe 09/16/2014   H/O medication noncompliance    Hypertension    Hypertensive heart disease    Hypokalemia    MI (myocardial infarction) [facility])    Patient denies having had a MI   NICM (nonischemic cardiomyopathy) (HCC)    a. cardiac cath 09/2014 with normal coronary arteries, EF 20-25% with severe global HK; b. echo 08/2014: EF 30-35%, false tendon in LV aepx of no clinical sig, diffuse HK, GR1DD, aortic root 40 mm, trivial Noah, trivial pericardial effusion posterior    Obesity, Class III, BMI 40-49.9 (morbid obesity) (HCC)    Sleep apnea    a. sleep study 05/2010; b. noncompliant with CPAP   Ventral hernia    a. incarcerated omentum 06/10/2015    Current Outpatient Medications   Medication Sig Dispense Refill   acetaminophen (TYLENOL) 650 MG CR tablet Take 650 mg by mouth every 8 (eight) hours as needed for pain.     albuterol (VENTOLIN HFA) 108 (90 Base) MCG/ACT inhaler Inhale 2 puffs into the lungs every 6 (six) hours as needed for wheezing or shortness of breath. 8 g 0   carvedilol (COREG) 25 MG tablet TAKE 1 TABLET BY MOUTH TWICE DAILY WITH MEALS 180 tablet 1   empagliflozin (JARDIANCE) 10 MG TABS tablet Take 1 tablet (10 mg total) by mouth daily before breakfast. 90 tablet 3   furosemide (LASIX) 40 MG tablet Take 0.5 tablets (20 mg total) by mouth daily. 90 tablet 3   metFORMIN (GLUCOPHAGE) 500 MG tablet Take 1 tablet by mouth once daily with breakfast (Patient not taking: Reported on 05/06/2022) 30 tablet 0   Multiple Vitamin (MULTIVITAMIN) tablet Take 1 tablet by mouth daily. [employer] One daily Multi Vitamin     sacubitril-valsartan (ENTRESTO) 97-103 MG Take 1 tablet by mouth 2 (two) times daily. 180 tablet 3   spironolactone (ALDACTONE) 25 MG tablet Take 1 tablet (25 mg total) by mouth daily. 30 tablet 5   No current facility-administered medications for this visit.     PHYSICAL EXAM:  General:  Well appearing. No resp difficulty HEENT: normal Neck: supple. JVP flat. No lymphadenopathy or thryomegaly appreciated. Cor: PMI normal. Regular rate & rhythm. No rubs, gallops or murmurs. Lungs: clear Abdomen: soft, nontender, nondistended. No hepatosplenomegaly. No bruits or masses.  Extremities: no cyanosis, clubbing, rash, trace pitting edema in left lower leg Neuro: alert & orientedx3, cranial nerves grossly intact. Moves all 4 extremities w/o difficulty. Affect pleasant.   ECG: SB with 1st degree AV block, HR 59   ASSESSMENT & PLAN:  1: NICM with mildly reduced ejection fraction- - suspect this is due to untreated OSA - NYHA class II - euvolemic - weighing daily; understands to call for an overnight weight gain of > 2 pounds or a weekly  weight gain of . 5 pounds - weight 338.2 pounds from last visit here 2+ months ago   - Echo 02/14/16: EF of 35-40% along with mild Noah/TR.  - Echo 08/25/16: EF of 60-65%. - echo 08/07/21: EF of 40%-45%.    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  - Mild Central Sleep Apnea with pAHIc 15.6/hr. - waiting on sleep lab appt - sleep study showed questionable atrial fibrillation; 14 day zio placed today  4: DM- - A1c 08/05/22 was 6.4%

## 2022-10-14 ENCOUNTER — Telehealth: Payer: Self-pay | Admitting: Family

## 2022-10-14 ENCOUNTER — Encounter: Payer: 59 | Admitting: Family

## 2022-10-14 NOTE — Telephone Encounter (Signed)
Patient did not show for his [facility] appointment on 10/14/22

## 2022-12-01 NOTE — Progress Notes (Unsigned)
PCP: Margarita Mail, DO (last seen 03/23) Primary Cardiologist: None  HPI:  Noah Rodriguez is a 47 y/o male with a history of obstructive sleep apnea, obesity, hypokalemia, gout, asthma and chronic heart failure.   Has not been admitted or been in the ED in the last 6 months.   Echo 02/14/16: EF of 35-40% along with mild Noah/TR.  Echo 08/25/16: EF of 60-65%.  Echo 08/07/21: EF of 40%-45%.   Cardiac catheterization was done 09/17/14 and showed an EF of 20-25% at that time. No CAD present.   He presents today for a HF follow-up visit with a chief complaint of right sided chest pain with radiation down the right arm ~ 1 week ago. Has associated occasional palpitations, minimal SOB with moderate exertion and slight pedal edema along with this. Denies cough, abdominal distention, dizziness or difficulty sleeping.   At last visit, spironolactone 25mg  daily was started and amlodipine was stopped. Had not returned nor gotten his lab work drawn since spironolactone initiation because his brother got sick and subsequently died.   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 Heard about his sleep study results but is unsure of what the next step is.   Ran out of metformin and it didn't have any refills on it. Does not have a PCP yet.    ROS: All systems negative except as listed in HPI, PMH and Problem List.  SH:  Social History   Socioeconomic History   Marital status: Single    Spouse name: Not on file   Number of children: Not on file   Years of education: Not on file   Highest education level: Not on file  Occupational History   Not on file  Tobacco Use   Smoking status: Never   Smokeless tobacco: Never  Vaping Use   Vaping status: Never Used  Substance and Sexual Activity   Alcohol use: No   Drug use: No   Sexual activity: Yes    Birth control/protection: Condom  Other Topics Concern   Not on file  Social History Narrative   Not on file   Social  Determinants of Health   Financial Resource Strain: Low Risk  (02/24/2017)   Overall Financial Resource Strain (CARDIA)    Difficulty of Paying Living Expenses: Not hard at all  Food Insecurity: No Food Insecurity (02/24/2017)   Hunger Vital Sign    Worried About Running Out of Food in the Last Year: Never true    Ran Out of Food in the Last Year: Never true  Transportation Needs: No Transportation Needs (02/24/2017)   PRAPARE - Administrator, Civil Service (Medical): No    Lack of Transportation (Non-Medical): No  Physical Activity: Sufficiently Active (12/14/2018)   Exercise Vital Sign    Days of Exercise per Week: 3 days    Minutes of Exercise per Session: 150+ min  Stress: Stress Concern Present (02/24/2017)   [employer] of Occupational Health - Occupational Stress Questionnaire    Feeling of Stress : Very much  Social Connections: Unknown (05/26/2021)   Received from [facility], [facility]   Social Network    Social Network: Not on file  Intimate Partner Violence: Unknown (04/17/2021)   Received from [facility], [facility]   HITS    Physically Hurt: Not on file    Insult or Talk Down To: Not on file    Threaten Physical Harm: Not on file    Scream or Curse:  Not on file    FH:  Family History  Problem Relation Age of Onset   HIV Mother    Hypertension Mother    Hypertension Other    Hypertension Maternal Grandmother    Hypertension Paternal Grandmother    Alcohol abuse Neg Hx    Cancer Neg Hx    Early death Neg Hx    Heart disease Neg Hx    Hyperlipidemia Neg Hx    Kidney disease Neg Hx    Stroke Neg Hx    Heart attack Neg Hx     Past Medical History:  Diagnosis Date   Acute kidney injury (HCC)    Asthma    Chronic combined systolic and diastolic CHF (congestive heart failure) (HCC)    EF < 25% on Cath (09/17/2014)   Gout    Gout of big toe 09/16/2014   H/O medication noncompliance    Hypertension    Hypertensive heart disease     Hypokalemia    MI (myocardial infarction) (HCC)    Patient denies having had a MI   NICM (nonischemic cardiomyopathy) (HCC)    a. cardiac cath 09/2014 with normal coronary arteries, EF 20-25% with severe global HK; b. echo 08/2014: EF 30-35%, false tendon in LV aepx of no clinical sig, diffuse HK, GR1DD, aortic root 40 mm, trivial Noah, trivial pericardial effusion posterior    Obesity, Class III, BMI 40-49.9 (morbid obesity) (HCC)    Sleep apnea    a. sleep study 05/2010; b. noncompliant with CPAP   Ventral hernia    a. incarcerated omentum 06/10/2015    Current Outpatient Medications  Medication Sig Dispense Refill   acetaminophen (TYLENOL) 650 MG CR tablet Take 650 mg by mouth every 8 (eight) hours as needed for pain.     albuterol (VENTOLIN HFA) 108 (90 Base) MCG/ACT inhaler Inhale 2 puffs into the lungs every 6 (six) hours as needed for wheezing or shortness of breath. 8 g 0   carvedilol (COREG) 25 MG tablet TAKE 1 TABLET BY MOUTH TWICE DAILY WITH MEALS 180 tablet 1   empagliflozin (JARDIANCE) 10 MG TABS tablet Take 1 tablet (10 mg total) by mouth daily before breakfast. 90 tablet 3   furosemide (LASIX) 40 MG tablet Take 0.5 tablets (20 mg total) by mouth daily. 90 tablet 3   metFORMIN (GLUCOPHAGE) 500 MG tablet Take 1 tablet by mouth once daily with breakfast (Patient not taking: Reported on 05/06/2022) 30 tablet 0   Multiple Vitamin (MULTIVITAMIN) tablet Take 1 tablet by mouth daily. [employer] One daily Multi Vitamin     sacubitril-valsartan (ENTRESTO) 97-103 MG Take 1 tablet by mouth 2 (two) times daily. 180 tablet 3   spironolactone (ALDACTONE) 25 MG tablet Take 1 tablet (25 mg total) by mouth daily. 30 tablet 5   No current facility-administered medications for this visit.   There were no vitals filed for this visit.  Wt Readings from Last 3 Encounters:  08/05/22 (!) 338 lb 2 oz (153.4 kg)  05/06/22 (!) 346 lb (156.9 kg)  04/08/22 (!) 348 lb (157.9 kg)   Lab Results   Component Value Date   CREATININE 1.96 (H) 08/05/2022   CREATININE 1.34 (H) 04/08/2022   CREATININE 1.47 (H) 12/22/2021   PHYSICAL EXAM:  General:  Well appearing. No resp difficulty HEENT: normal Neck: supple. JVP flat. No lymphadenopathy or thryomegaly appreciated. Cor: PMI normal. Regular rate & rhythm. No rubs, gallops or murmurs. Lungs: clear Abdomen: soft, nontender, nondistended. No hepatosplenomegaly.  No bruits or masses.  Extremities: no cyanosis, clubbing, rash, trace pitting edema in left lower leg Neuro: alert & orientedx3, cranial nerves grossly intact. Moves all 4 extremities w/o difficulty. Affect pleasant.   ECG: SB with 1st degree AV block, HR 59   ASSESSMENT & PLAN:  1: NICM with mildly reduced ejection fraction- - suspect this is due to untreated OSA - NYHA class II - euvolemic - weighing daily; understands to call for an overnight weight gain of > 2 pounds or a weekly weight gain of . 5 pounds - weight down 8 pounds from last visit here 3 months ago   - Echo 02/14/16: EF of 35-40% along with mild Noah/TR.  - Echo 08/25/16: EF of 60-65%. - echo 08/07/21: EF of 40%-45%.    - drinking 2 cases of water every week as he sweats a lot at work; now driving a forklift in [place]  - not adding any salt to his food & really trying to watch his sodium intake if he eats out - continue carvedilol 25mg  BID; discussed decreasing this but he is feeling so good that he prefers to leave it as it - continue jardiance 10mg  daily - continue entresto 97/103mg  BID - continue furosemide 40mg  daily; discussed decreasing this but he'd rather not because he feels so good - continue spironolactone 25mg  daily - EKG today shows SB - BMP today as he hasn't gotten it done since starting spironolactone - pro-BNP on 11/04/19 was 82   2: HTN- - BP 118/75 - had video visit with PCP Caralee Ates) 03/23; needs to get f/u appt scheduled - BMP 04/08/22 reviewed and showed Na 137, K 4.0, GFR >60 and  Scr 1.34  3: OSA- - NS for pulmonology 04/08/21 - Severe Obstructive Sleep Apnea with AHI 72.6/hr.  - Mild Central Sleep Apnea with pAHIc 15.6/hr. - waiting on sleep lab appt - sleep study showed questionable atrial fibrillation; 14 day zio placed today  4: DM- - A1c 11/18/21 was 6.0% - ran out of refills on his metformin - A1c today; may not need metformin since he's lost so much weight and now drinks very little soda

## 2022-12-02 ENCOUNTER — Encounter: Payer: Self-pay | Admitting: Family

## 2022-12-02 ENCOUNTER — Ambulatory Visit: Payer: 59 | Attending: Family | Admitting: Family

## 2022-12-02 VITALS — BP 148/86 | HR 59 | Ht 67.0 in | Wt 338.0 lb

## 2022-12-02 DIAGNOSIS — Z79899 Other long term (current) drug therapy: Secondary | ICD-10-CM | POA: Diagnosis not present

## 2022-12-02 DIAGNOSIS — I428 Other cardiomyopathies: Secondary | ICD-10-CM | POA: Insufficient documentation

## 2022-12-02 DIAGNOSIS — Z7984 Long term (current) use of oral hypoglycemic drugs: Secondary | ICD-10-CM | POA: Insufficient documentation

## 2022-12-02 DIAGNOSIS — E119 Type 2 diabetes mellitus without complications: Secondary | ICD-10-CM | POA: Diagnosis not present

## 2022-12-02 DIAGNOSIS — Z6841 Body Mass Index (BMI) 40.0 and over, adult: Secondary | ICD-10-CM | POA: Diagnosis not present

## 2022-12-02 DIAGNOSIS — R5383 Other fatigue: Secondary | ICD-10-CM | POA: Insufficient documentation

## 2022-12-02 DIAGNOSIS — G4731 Primary central sleep apnea: Secondary | ICD-10-CM | POA: Insufficient documentation

## 2022-12-02 DIAGNOSIS — I110 Hypertensive heart disease with heart failure: Secondary | ICD-10-CM | POA: Insufficient documentation

## 2022-12-02 DIAGNOSIS — I5022 Chronic systolic (congestive) heart failure: Secondary | ICD-10-CM | POA: Diagnosis not present

## 2022-12-02 DIAGNOSIS — J45909 Unspecified asthma, uncomplicated: Secondary | ICD-10-CM | POA: Insufficient documentation

## 2022-12-02 DIAGNOSIS — R0789 Other chest pain: Secondary | ICD-10-CM | POA: Insufficient documentation

## 2022-12-02 DIAGNOSIS — G4733 Obstructive sleep apnea (adult) (pediatric): Secondary | ICD-10-CM | POA: Diagnosis not present

## 2022-12-02 DIAGNOSIS — I10 Essential (primary) hypertension: Secondary | ICD-10-CM | POA: Diagnosis not present

## 2022-12-02 DIAGNOSIS — M79606 Pain in leg, unspecified: Secondary | ICD-10-CM | POA: Insufficient documentation

## 2022-12-02 LAB — HEMOGLOBIN A1C
Est. average glucose Bld gHb Est-mCnc: 131 mg/dL
Hgb A1c MFr Bld: 6.2 % — ABNORMAL HIGH (ref 4.8–5.6)

## 2022-12-02 MED ORDER — HYDROCHLOROTHIAZIDE 25 MG PO TABS: 25.0000 mg | ORAL_TABLET | Freq: Every day | ORAL | 3 refills | Status: DC

## 2022-12-02 MED ORDER — CARVEDILOL 12.5 MG PO TABS: 25.0000 mg | ORAL_TABLET | Freq: Two times a day (BID) | ORAL | 3 refills | Status: DC

## 2022-12-02 MED ORDER — HYDROCHLOROTHIAZIDE 12.5 MG PO CAPS: 12.5000 mg | ORAL_CAPSULE | Freq: Every day | ORAL | 3 refills | Status: DC

## 2022-12-02 NOTE — Patient Instructions (Addendum)
STOP Lasix  START hydrochlorothiazide 25mg  daily  DECREASE Coreg to 12.5mg  bid  Routine lab work today. Will notify you of abnormal results  Follow up in 1 month  Do the following things EVERYDAY: Weigh yourself in the morning before breakfast. Write it down and keep it in a log. Take your medicines as prescribed Eat low salt foods--Limit salt (sodium) to 2000 mg per day.  Stay as active as you can everyday Limit all fluids for the day to less than 2 liters

## 2022-12-03 LAB — BASIC METABOLIC PANEL
BUN/Creatinine Ratio: 12 (ref 9–20)
BUN: 20 mg/dL (ref 6–24)
CO2: 22 mmol/L (ref 20–29)
Calcium: 9.7 mg/dL (ref 8.7–10.2)
Chloride: 101 mmol/L (ref 96–106)
Creatinine, Ser: 1.63 mg/dL — ABNORMAL HIGH (ref 0.76–1.27)
Glucose: 88 mg/dL (ref 70–99)
Potassium: 4.9 mmol/L (ref 3.5–5.2)
Sodium: 141 mmol/L (ref 134–144)
eGFR: 52 mL/min/{1.73_m2} — ABNORMAL LOW (ref >59)

## 2022-12-06 ENCOUNTER — Telehealth: Payer: Self-pay

## 2022-12-06 MED ORDER — SEMAGLUTIDE(0.25 OR 0.5MG/DOS) 2 MG/3ML ~~LOC~~ SOPN: 0.2500 mg | PEN_INJECTOR | SUBCUTANEOUS | 2 refills | Status: DC

## 2022-12-06 NOTE — Telephone Encounter (Signed)
-----   Message from Delma Freeze sent at 12/06/2022  8:20 AM EST ----- Begin ozempic 0.25mg  weekly. Decrease fried/ fatty foods to lessen the chance of nausea

## 2022-12-13 ENCOUNTER — Other Ambulatory Visit: Payer: Self-pay | Admitting: Family

## 2022-12-14 ENCOUNTER — Other Ambulatory Visit: Payer: Self-pay | Admitting: Family

## 2022-12-31 ENCOUNTER — Telehealth: Payer: Self-pay | Admitting: Family

## 2022-12-31 NOTE — Telephone Encounter (Signed)
Pt confirmed appt for 01/03/23

## 2023-01-03 ENCOUNTER — Encounter: Payer: 59 | Admitting: Family

## 2023-01-17 ENCOUNTER — Encounter: Payer: 59 | Admitting: Family

## 2023-07-06 ENCOUNTER — Telehealth: Payer: Self-pay | Admitting: Family

## 2023-07-06 NOTE — Telephone Encounter (Signed)
 Called to confirm/remind patient of their appointment at [facility] on 07/07/23.   Appointment:   [] Confirmed  [] Left mess   [] No answer/No voice mail  [x] VM Full/unable to leave message  [] Phone not in service  Patient reminded to bring all medications and/or complete list.  Confirmed patient has transportation. Gave directions, instructed to utilize valet parking. \

## 2023-07-07 ENCOUNTER — Encounter: Admitting: Family

## 2023-07-07 ENCOUNTER — Ambulatory Visit: Admitting: Family

## 2023-07-07 ENCOUNTER — Encounter: Payer: Self-pay | Admitting: Family

## 2023-07-07 ENCOUNTER — Ambulatory Visit: Payer: Self-pay | Admitting: Family

## 2023-07-07 ENCOUNTER — Other Ambulatory Visit
Admission: RE | Admit: 2023-07-07 | Discharge: 2023-07-07 | Disposition: A | Discharge status: Home / Self Care | Source: Ambulatory Visit | Attending: Family | Admitting: Family

## 2023-07-07 VITALS — BP 136/79 | HR 54 | Wt 337.0 lb

## 2023-07-07 DIAGNOSIS — E119 Type 2 diabetes mellitus without complications: Secondary | ICD-10-CM | POA: Diagnosis not present

## 2023-07-07 DIAGNOSIS — G4733 Obstructive sleep apnea (adult) (pediatric): Secondary | ICD-10-CM | POA: Diagnosis not present

## 2023-07-07 DIAGNOSIS — I10 Essential (primary) hypertension: Secondary | ICD-10-CM

## 2023-07-07 DIAGNOSIS — I5022 Chronic systolic (congestive) heart failure: Secondary | ICD-10-CM

## 2023-07-07 LAB — LIPID PANEL
Cholesterol: 116 mg/dL (ref 0–200)
HDL: 29 mg/dL — ABNORMAL LOW (ref >40)
LDL Cholesterol: 72 mg/dL (ref 0–99)
Total CHOL/HDL Ratio: 4 ratio
Triglycerides: 76 mg/dL (ref <150)
VLDL: 15 mg/dL (ref 0–40)

## 2023-07-07 LAB — BASIC METABOLIC PANEL WITH GFR
Anion gap: 6 (ref 5–15)
BUN: 23 mg/dL — ABNORMAL HIGH (ref 6–20)
CO2: 28 mmol/L (ref 22–32)
Calcium: 8.6 mg/dL — ABNORMAL LOW (ref 8.9–10.3)
Chloride: 104 mmol/L (ref 98–111)
Creatinine, Ser: 1.6 mg/dL — ABNORMAL HIGH (ref 0.61–1.24)
GFR, Estimated: 53 mL/min — ABNORMAL LOW (ref >60)
Glucose, Bld: 98 mg/dL (ref 70–99)
Potassium: 3.9 mmol/L (ref 3.5–5.1)
Sodium: 138 mmol/L (ref 135–145)

## 2023-07-07 LAB — HEMOGLOBIN A1C
Hgb A1c MFr Bld: 5.7 % — ABNORMAL HIGH (ref 4.8–5.6)
Mean Plasma Glucose: 116.89 mg/dL

## 2023-07-07 MED ORDER — SACUBITRIL-VALSARTAN 97-103 MG PO TABS: 1.0000 | ORAL_TABLET | Freq: Two times a day (BID) | ORAL | 3 refills | Status: DC

## 2023-07-07 MED ORDER — CARVEDILOL 12.5 MG PO TABS: 12.5000 mg | ORAL_TABLET | Freq: Two times a day (BID) | ORAL | Status: DC

## 2023-07-07 MED ORDER — EMPAGLIFLOZIN 10 MG PO TABS: 10.0000 mg | ORAL_TABLET | Freq: Every day | ORAL | 3 refills | Status: DC

## 2023-07-07 NOTE — Patient Instructions (Addendum)
 Work on getting primary care doctor in [place].   Lab Work:  Go over to the [facility]ICAL [facility]. Go pass the gift shop and have your blood work completed.  We will only call you if the results are abnormal or if the provider would like to make medication changes.   Testing/Procedures:  Please have your echo completed. You will check in for this at the [facility]. You have to arrive 15 MINS EARLY for preparation, otherwise you will have to reschedule.  Your provider has ordered a sleep study test. You will be called to have this test arranged     Follow-Up in: September  At the [facility], you and your health needs are our priority. We have a designated team specialized in the treatment of Heart Failure. This Care Team includes your primary Heart Failure Specialized Cardiologist (physician), Advanced Practice Providers (APPs- Physician Assistants and Nurse Practitioners), and Pharmacist who all work together to provide you with the care you need, when you need it.   You may see any of the following providers on your designated Care Team at your next follow up:  Dr. Toribio Fuel Dr. Ezra Shuck Dr. Ria Commander Dr. Odis Brownie Ellouise Class, FNP Jaun Bash, RPH-CPP  Please be sure to bring in all your medications bottles to every appointment.   Need to Contact Us :  If you have any questions or concerns before your next appointment please send us  a message through [place] or call our office at [phone].    TO LEAVE A MESSAGE FOR THE NURSE SELECT OPTION 2, PLEASE LEAVE A MESSAGE INCLUDING: YOUR NAME DATE OF BIRTH CALL BACK NUMBER REASON FOR CALL**this is important as we prioritize the call backs  YOU WILL RECEIVE A CALL BACK THE SAME DAY AS LONG AS YOU CALL BEFORE 4:00 PM

## 2023-07-07 NOTE — Progress Notes (Signed)
 [facility] Note    PCP: None Cardiologist: None   Chief Complaint: fatigue   HPI:  Noah Rodriguez is a 48 y/o male with a history of obstructive sleep apnea, obesity, hypokalemia, gout, asthma and chronic heart failure.   Has not been admitted or been in the ED in the last 6 months.   Echo 02/14/16: EF of 35-40% along with mild Noah/TR.  Echo 08/25/16: EF of 60-65%.  Echo 08/07/21: EF of 40%-45%.   Cardiac catheterization was done 09/17/14 and showed an EF of 20-25% at that time. No CAD present.   He presents today for a HF follow-up visit with a chief complaint of fatigue. Has associated occasional cough, pedal edema in left lower leg. Denies shortness of breath, chest pain, palpitations, abdominal distention, dizziness. Continues to work night shift in [place] and lives most of the time in [place]. Drives back locally once weekly or biweekly to see his mom etc. Now has commercial insurance through his employer.   Not adding salt to his food. Eating [employer] at night during night shift meal. Has been under a lot of stress as his brother, aunt and grandma have passed away in the last 6 months.   ROS: All systems negative except what is listed in HPI, PMH and Problem List  Past Medical History:  Diagnosis Date   Acute kidney injury (HCC)    Asthma    Chronic combined systolic and diastolic CHF (congestive heart failure) (HCC)    EF < 25% on Cath (09/17/2014)   Gout    Gout of big toe 09/16/2014   H/O medication noncompliance    Hypertension    Hypertensive heart disease    Hypokalemia    MI (myocardial infarction) [facility])    Patient denies having had a MI   NICM (nonischemic cardiomyopathy) (HCC)    a. cardiac cath 09/2014 with normal coronary arteries, EF 20-25% with severe global HK; b. echo 08/2014: EF 30-35%, false tendon in LV aepx of no clinical sig, diffuse HK, GR1DD, aortic root 40 mm, trivial Noah, trivial pericardial effusion posterior    Obesity, Class III,  BMI 40-49.9 (morbid obesity) (HCC)    Sleep apnea    a. sleep study 05/2010; b. noncompliant with CPAP   Ventral hernia    a. incarcerated omentum 06/10/2015    Current Outpatient Medications  Medication Sig Dispense Refill   acetaminophen  (TYLENOL ) 650 MG CR tablet Take 650 mg by mouth every 8 (eight) hours as needed for pain.     carvedilol  (COREG ) 12.5 MG tablet Take 2 tablets (25 mg total) by mouth 2 (two) times daily with a meal. 180 tablet 3   empagliflozin  (JARDIANCE ) 10 MG TABS tablet Take one tablet by mouth daily. 90 tablet 3   hydrochlorothiazide  (HYDRODIURIL ) 25 MG tablet Take 1 tablet (25 mg total) by mouth daily. 90 tablet 3   metFORMIN  (GLUCOPHAGE ) 500 MG tablet Take 1 tablet by mouth once daily with breakfast (Patient not taking: Reported on 05/06/2022) 30 tablet 0   Multiple Vitamin (MULTIVITAMIN) tablet Take 1 tablet by mouth daily. [employer] Energy One daily Multi Vitamin     sacubitril -valsartan  (ENTRESTO ) 97-103 MG Take 1 tablet by mouth 2 (two) times daily. 180 tablet 3   Semaglutide ,0.25 or 0.5MG /DOS, 2 MG/3ML SOPN Inject 0.25 mg into the skin once a week. 3 mL 2   spironolactone  (ALDACTONE ) 25 MG tablet Take 1 tablet by mouth once daily 30 tablet 5   No current facility-administered  medications for this visit.    No Known Allergies    Social History   Socioeconomic History   Marital status: Single    Spouse name: Not on file   Number of children: Not on file   Years of education: Not on file   Highest education level: Not on file  Occupational History   Not on file  Tobacco Use   Smoking status: Never   Smokeless tobacco: Never  Vaping Use   Vaping status: Never Used  Substance and Sexual Activity   Alcohol use: No   Drug use: No   Sexual activity: Yes    Birth control/protection: Condom  Other Topics Concern   Not on file  Social History Narrative   Not on file   Social Drivers of Health   Financial Resource Strain: Low Risk  (02/24/2017)    Overall Financial Resource Strain (CARDIA)    Difficulty of Paying Living Expenses: Not hard at all  Food Insecurity: No Food Insecurity (02/24/2017)   Hunger Vital Sign    Worried About Running Out of Food in the Last Year: Never true    Ran Out of Food in the Last Year: Never true  Transportation Needs: No Transportation Needs (02/24/2017)   PRAPARE - Administrator, Civil Service (Medical): No    Lack of Transportation (Non-Medical): No  Physical Activity: Sufficiently Active (12/14/2018)   Exercise Vital Sign    Days of Exercise per Week: 3 days    Minutes of Exercise per Session: 150+ min  Stress: Stress Concern Present (02/24/2017)   [employer] of Occupational Health - Occupational Stress Questionnaire    Feeling of Stress : Very much  Social Connections: Unknown (05/26/2021)   [facility]   Social Network    Social Network: Not on file  Intimate Partner Violence: Unknown (04/17/2021)   Received from [facility]   HITS    Physically Hurt: Not on file    Insult or Talk Down To: Not on file    Threaten Physical Harm: Not on file    Scream or Curse: Not on file      Family History  Problem Relation Age of Onset   HIV Mother    Hypertension Mother    Hypertension Other    Hypertension Maternal Grandmother    Hypertension Paternal Grandmother    Alcohol abuse Neg Hx    Cancer Neg Hx    Early death Neg Hx    Heart disease Neg Hx    Hyperlipidemia Neg Hx    Kidney disease Neg Hx    Stroke Neg Hx    Heart attack Neg Hx    Vitals:   07/07/23 1124  BP: 136/79  Pulse: (!) 54  SpO2: 99%  Weight: (!) 337 lb (152.9 kg)   Wt Readings from Last 3 Encounters:  07/07/23 (!) 337 lb (152.9 kg)  12/02/22 (!) 338 lb (153.3 kg)  08/05/22 (!) 338 lb 2 oz (153.4 kg)   Lab Results  Component Value Date   CREATININE 1.60 (H) 07/07/2023   CREATININE 1.63 (H) 12/02/2022   CREATININE 1.96 (H) 08/05/2022    PHYSICAL EXAM:  General: Well  appearing. No resp difficulty HEENT: normal Neck: supple, no JVD Cor: Regular rhythm, bradycardic. No rubs, gallops or murmurs Lungs: clear Abdomen: soft, nontender, nondistended. Extremities: no cyanosis, clubbing, rash, trace pitting edema left lower leg Neuro: alert & oriented X 3. Moves all 4 extremities w/o difficulty. Affect pleasant  ECG: not done   ASSESSMENT & PLAN:  1: NICM with mildly reduced ejection fraction- - suspect this is due to untreated OSA - NYHA class II - euvolemic - weighing daily; understands to call for an overnight weight gain of > 2 pounds or a weekly weight gain of . 5 pounds - weight stable from last visit here 6 months ago   - Echo 02/14/16: EF of 35-40% along with mild Noah/TR.  - Echo 08/25/16: EF of 60-65%. 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[place] - not adding any salt to his food & really trying to watch his sodium intake if he eats out - continue carvedilol  12.5mg  BID - continue jardiance  10mg  daily; commercial copay card provided - continue entresto  97/103mg  BID; commercial copay card provided - continue hydrochlorothiazide  25mg  daily  - continue spironolactone  25mg  daily - BMET today - pro-BNP on 11/04/19 was 82   2: HTN- - BP 136/79 - working night shift - had video visit with PCP Neill) 03/23; he will work on getting PCP around [place] - BMP 12/02/22 reviewed: Na 141, K 4.9, GFR 52 and Scr 1.63 - BMET today  3: OSA- - NS for pulmonology 04/08/21 - Severe Obstructive Sleep Apnea with AHI 72.6/hr.  - Mild Central Sleep Apnea with pAHIc 15.6/hr. - referral made to sleep lab now that he has insurance  4: DM- - A1c 08/05/22 was 6.4% - not currently taking metformin  - A1c today  5: Obesity- - BMI 52.78 kg/m2 - lipid panel today - discussed GLP1 and he says that he's willing to try it if it's approved; will get lab work back first   Explained that  since he's now living in [place], he could transition all care to somewhere locally. Patient wants to continue coming here as he says that he drives locally every 1-2 weeks.   Ellouise DELENA Class, FNP 07/07/23

## 2023-07-08 ENCOUNTER — Other Ambulatory Visit (HOSPITAL_COMMUNITY): Payer: Self-pay

## 2023-07-08 ENCOUNTER — Telehealth: Payer: Self-pay

## 2023-07-08 NOTE — Telephone Encounter (Signed)
 [facility] Patient Advocate Encounter  Prior authorization for Trulicity has been submitted and approved. Test billing returns $25 for 28 day supply. Evoucher has been applied.  Key: [id number] Effective: 07/08/2023 to 07/07/2024  Rachel DEL, CPhT Rx Patient Advocate Phone: [phone]

## 2023-07-08 NOTE — Telephone Encounter (Signed)
 [facility] Patient Advocate Encounter  Prior authorization for Jardiance  has been submitted and approved. Test billing returns $93.57 for 30 day supply. This plan limits 30 day refill, and this patient is eligible to use copay savings cards.  Key: [id number] Effective: 07/08/2023 to 07/07/2024  Rachel DEL, CPhT Rx Patient Advocate Phone: [phone]

## 2023-08-01 ENCOUNTER — Other Ambulatory Visit: Payer: Self-pay | Admitting: Family

## 2023-08-12 ENCOUNTER — Other Ambulatory Visit: Payer: Self-pay | Admitting: Family

## 2023-09-14 ENCOUNTER — Ambulatory Visit
Admission: RE | Admit: 2023-09-14 | Discharge: 2023-09-14 | Disposition: A | Discharge status: Home / Self Care | Source: Ambulatory Visit | Attending: Family | Admitting: Family

## 2023-09-14 ENCOUNTER — Ambulatory Visit: Payer: Self-pay | Admitting: Family

## 2023-09-14 ENCOUNTER — Other Ambulatory Visit: Payer: Self-pay | Admitting: Medical Genetics

## 2023-09-14 DIAGNOSIS — I517 Cardiomegaly: Secondary | ICD-10-CM | POA: Insufficient documentation

## 2023-09-14 DIAGNOSIS — I340 Nonrheumatic mitral (valve) insufficiency: Secondary | ICD-10-CM | POA: Diagnosis not present

## 2023-09-14 DIAGNOSIS — I5022 Chronic systolic (congestive) heart failure: Secondary | ICD-10-CM | POA: Insufficient documentation

## 2023-09-14 LAB — ECHOCARDIOGRAM COMPLETE
AR max vel: 4.02 cm2
AV Area VTI: 4.22 cm2
AV Area mean vel: 4.07 cm2
AV Mean grad: 3 mmHg
AV Peak grad: 5.2 mmHg
Ao pk vel: 1.14 m/s
Area-P 1/2: 2.87 cm2
Calc EF: 36.1 %
MV VTI: 4.63 cm2
S' Lateral: 3.1 cm
Single Plane A2C EF: 32.2 %
Single Plane A4C EF: 49.9 %

## 2023-09-15 ENCOUNTER — Telehealth: Payer: Self-pay

## 2023-09-15 NOTE — Telephone Encounter (Signed)
 Called pt and advised him that his two prescriptions were sent to [facility] on [address] at this last appt. Pt states that he now has insurance and co-pay cards to afford the medications. No further questions at this time.

## 2023-09-27 ENCOUNTER — Telehealth: Payer: Self-pay | Admitting: Family

## 2023-09-27 NOTE — Progress Notes (Deleted)
 [facility] Note    PCP: None Cardiologist: None   Chief Complaint: fatigue   HPI:  Noah Rodriguez is a 48 y/o male with a history of obstructive sleep apnea, obesity, hypokalemia, gout, asthma and chronic heart failure.   Has not been admitted or been in the ED in the last 6 months.   Echo 02/14/16: EF of 35-40% along with mild Noah/TR.  Echo 08/25/16: EF of 60-65%.  Echo 08/07/21: EF of 40%-45%.   Cardiac catheterization was done 09/17/14 and showed an EF of 20-25% at that time. No CAD present.   He presents today for a HF follow-up visit with a chief complaint of fatigue. Has associated occasional cough, pedal edema in left lower leg. Denies shortness of breath, chest pain, palpitations, abdominal distention, dizziness. Continues to work night shift in [place] and lives most of the time in [place]. Drives back locally once weekly or biweekly to see his mom etc. Now has commercial insurance through his employer.   Not adding salt to his food. Eating [employer] at night during night shift meal. Has been under a lot of stress as his brother, aunt and grandma have passed away in the last 6 months.   ROS: All systems negative except what is listed in HPI, PMH and Problem List  Past Medical History:  Diagnosis Date   Acute kidney injury (HCC)    Asthma    Chronic combined systolic and diastolic CHF (congestive heart failure) (HCC)    EF < 25% on Cath (09/17/2014)   Gout    Gout of big toe 09/16/2014   H/O medication noncompliance    Hypertension    Hypertensive heart disease    Hypokalemia    MI (myocardial infarction) [facility])    Patient denies having had a MI   NICM (nonischemic cardiomyopathy) (HCC)    a. cardiac cath 09/2014 with normal coronary arteries, EF 20-25% with severe global HK; b. echo 08/2014: EF 30-35%, false tendon in LV aepx of no clinical sig, diffuse HK, GR1DD, aortic root 40 mm, trivial Noah, trivial pericardial effusion posterior    Obesity, Class III,  BMI 40-49.9 (morbid obesity)    Sleep apnea    a. sleep study 05/2010; b. noncompliant with CPAP   Ventral hernia    a. incarcerated omentum 06/10/2015    Current Outpatient Medications  Medication Sig Dispense Refill   acetaminophen  (TYLENOL ) 650 MG CR tablet Take 650 mg by mouth every 8 (eight) hours as needed for pain.     carvedilol  (COREG ) 12.5 MG tablet TAKE 2 TABLETS BY MOUTH TWICE DAILY WITH A MEAL 90 tablet 0   empagliflozin  (JARDIANCE ) 10 MG TABS tablet Take 1 tablet (10 mg total) by mouth daily. 90 tablet 3   hydrochlorothiazide  (HYDRODIURIL ) 25 MG tablet Take 1 tablet (25 mg total) by mouth daily. 90 tablet 3   Multiple Vitamin (MULTIVITAMIN) tablet Take 1 tablet by mouth daily. [employer] Energy One daily Multi Vitamin     sacubitril -valsartan  (ENTRESTO ) 97-103 MG Take 1 tablet by mouth 2 (two) times daily. 180 tablet 3   Semaglutide ,0.25 or 0.5MG /DOS, 2 MG/3ML SOPN Inject 0.25 mg into the skin once a week. 3 mL 2   spironolactone  (ALDACTONE ) 25 MG tablet Take 1 tablet by mouth once daily 30 tablet 5   No current facility-administered medications for this visit.    No Known Allergies    Social History   Socioeconomic History   Marital status: Single    Spouse  name: Not on file   Number of children: Not on file   Years of education: Not on file   Highest education level: Not on file  Occupational History   Not on file  Tobacco Use   Smoking status: Never   Smokeless tobacco: Never  Vaping Use   Vaping status: Never Used  Substance and Sexual Activity   Alcohol use: No   Drug use: No   Sexual activity: Yes    Birth control/protection: Condom  Other Topics Concern   Not on file  Social History Narrative   Not on file   Social Drivers of Health   Financial Resource Strain: Low Risk  (02/24/2017)   Overall Financial Resource Strain (CARDIA)    Difficulty of Paying Living Expenses: Not hard at all  Food Insecurity: No Food Insecurity (02/24/2017)   Hunger  Vital Sign    Worried About Running Out of Food in the Last Year: Never true    Ran Out of Food in the Last Year: Never true  Transportation Needs: No Transportation Needs (02/24/2017)   PRAPARE - Administrator, Civil Service (Medical): No    Lack of Transportation (Non-Medical): No  Physical Activity: Sufficiently Active (12/14/2018)   Exercise Vital Sign    Days of Exercise per Week: 3 days    Minutes of Exercise per Session: 150+ min  Stress: Stress Concern Present (02/24/2017)   [employer] of Occupational Health - Occupational Stress Questionnaire    Feeling of Stress : Very much  Social Connections: Unknown (05/26/2021)   Received from [facility]   Social Network    Social Network: Not on file  Intimate Partner Violence: Unknown (04/17/2021)   Received from [facility]   HITS    Physically Hurt: Not on file    Insult or Talk Down To: Not on file    Threaten Physical Harm: Not on file    Scream or Curse: Not on file      Family History  Problem Relation Age of Onset   HIV Mother    Hypertension Mother    Hypertension Other    Hypertension Maternal Grandmother    Hypertension Paternal Grandmother    Alcohol abuse Neg Hx    Cancer Neg Hx    Early death Neg Hx    Heart disease Neg Hx    Hyperlipidemia Neg Hx    Kidney disease Neg Hx    Stroke Neg Hx    Heart attack Neg Hx    There were no vitals filed for this visit.  Wt Readings from Last 3 Encounters:  07/07/23 (!) 152.9 kg  12/02/22 (!) 153.3 kg  08/05/22 (!) 153.4 kg   Lab Results  Component Value Date   CREATININE 1.60 (H) 07/07/2023   CREATININE 1.63 (H) 12/02/2022   CREATININE 1.96 (H) 08/05/2022    PHYSICAL EXAM:  General: Well appearing. No resp difficulty HEENT: normal Neck: supple, no JVD Cor: Regular rhythm, bradycardic. No rubs, gallops or murmurs Lungs: clear Abdomen: soft, nontender, nondistended. Extremities: no cyanosis, clubbing, rash, trace pitting edema left  lower leg Neuro: alert & oriented X 3. Moves all 4 extremities w/o difficulty. Affect pleasant   ECG: not done   ASSESSMENT & PLAN:  1: NICM with mildly reduced ejection fraction- - suspect this is due to untreated OSA - NYHA class II - euvolemic - weighing daily; understands to call for an overnight weight gain of > 2 pounds or a weekly weight gain of .  5 pounds - weight stable from last visit here 6 months ago   - Echo 02/14/16: EF of 35-40% along with mild Noah/TR.  - Echo 08/25/16: EF of 60-65%. 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[place] - not adding any salt to his food & really trying to watch his sodium intake if he eats out - continue carvedilol  12.5mg  BID - continue jardiance  10mg  daily; commercial copay card provided - continue entresto  97/103mg  BID; commercial copay card provided - continue hydrochlorothiazide  25mg  daily  - continue spironolactone  25mg  daily - BMET today - pro-BNP on 11/04/19 was 82   2: HTN- - BP 136/79 - working night shift - had video visit with PCP Neill) 03/23; he will work on getting PCP around [place] - BMP 12/02/22 reviewed: Na 141, K 4.9, GFR 52 and Scr 1.63 - BMET today  3: OSA- - NS for pulmonology 04/08/21 - Severe Obstructive Sleep Apnea with AHI 72.6/hr.  - Mild Central Sleep Apnea with pAHIc 15.6/hr. - referral made to sleep lab now that he has insurance  4: DM- - A1c 08/05/22 was 6.4% - not currently taking metformin  - A1c today  5: Obesity- - BMI 52.78 kg/m2 - lipid panel today - discussed GLP1 and he says that he's willing to try it if it's approved; will get lab work back first   Explained that since he's now living in [place], he could transition all care to somewhere locally. Patient wants to continue coming here as he says that he drives locally every 1-2 weeks.   Solomon Blumenthal, RN 09/27/23

## 2023-09-27 NOTE — Telephone Encounter (Signed)
 Called to confirm/remind patient of their appointment at the [facility] on 09/28/23.   Appointment:   [x] Confirmed  [] Left mess   [] No answer/No voice mail  [] VM Full/unable to leave message  [] Phone not in service  Patient reminded to bring all medications and/or complete list.  Confirmed patient has transportation. Gave directions, instructed to utilize valet parking.

## 2023-09-27 NOTE — Progress Notes (Unsigned)
 Advanced Heart Failure Clinic Note    PCP: None Cardiologist: None   Chief Complaint: fatigue   HPI:  Noah Rodriguez is a 48 y/o male with a history of obstructive sleep apnea, obesity, hypokalemia, gout, asthma and chronic heart failure.   Has not been admitted or been in the ED in the last 6 months.   Echo 02/14/16: EF of 35-40% along with mild Noah/TR.  Echo 08/25/16: EF of 60-65%.  Echo 08/07/21: EF of 40%-45%.   Cardiac catheterization was done 09/17/14 and showed an EF of 20-25% at that time. No CAD present.   He presents today for a HF follow-up visit with a chief complaint of fatigue. Has associated occasional cough, pedal edema in left lower leg. Denies shortness of breath, chest pain, palpitations, abdominal distention, dizziness. Continues to work night shift in [place] and lives most of the time in [place]. Drives back locally once weekly or biweekly to see his mom etc. Now has commercial insurance through his employer.   Not adding salt to his food. Eating [employer] at night during night shift meal. Has been under a lot of stress as his brother, aunt and grandma have passed away in the last 6 months.   ROS: All systems negative except what is listed in HPI, PMH and Problem List  Past Medical History:  Diagnosis Date   Acute kidney injury (HCC)    Asthma    Chronic combined systolic and diastolic CHF (congestive heart failure) (HCC)    EF < 25% on Cath (09/17/2014)   Gout    Gout of big toe 09/16/2014   H/O medication noncompliance    Hypertension    Hypertensive heart disease    Hypokalemia    MI (myocardial infarction) [facility])    Patient denies having had a MI   NICM (nonischemic cardiomyopathy) (HCC)    a. cardiac cath 09/2014 with normal coronary arteries, EF 20-25% with severe global HK; b. echo 08/2014: EF 30-35%, false tendon in LV aepx of no clinical sig, diffuse HK, GR1DD, aortic root 40 mm, trivial Noah, trivial pericardial effusion posterior    Obesity, Class III,  BMI 40-49.9 (morbid obesity)    Sleep apnea    a. sleep study 05/2010; b. noncompliant with CPAP   Ventral hernia    a. incarcerated omentum 06/10/2015    Current Outpatient Medications  Medication Sig Dispense Refill   acetaminophen  (TYLENOL ) 650 MG CR tablet Take 650 mg by mouth every 8 (eight) hours as needed for pain.     carvedilol  (COREG ) 12.5 MG tablet TAKE 2 TABLETS BY MOUTH TWICE DAILY WITH A MEAL 90 tablet 0   empagliflozin  (JARDIANCE ) 10 MG TABS tablet Take 1 tablet (10 mg total) by mouth daily. 90 tablet 3   hydrochlorothiazide  (HYDRODIURIL ) 25 MG tablet Take 1 tablet (25 mg total) by mouth daily. 90 tablet 3   Multiple Vitamin (MULTIVITAMIN) tablet Take 1 tablet by mouth daily. [employer] Energy One daily Multi Vitamin     sacubitril -valsartan  (ENTRESTO ) 97-103 MG Take 1 tablet by mouth 2 (two) times daily. 180 tablet 3   Semaglutide ,0.25 or 0.5MG /DOS, 2 MG/3ML SOPN Inject 0.25 mg into the skin once a week. 3 mL 2   spironolactone  (ALDACTONE ) 25 MG tablet Take 1 tablet by mouth once daily 30 tablet 5   No current facility-administered medications for this visit.    No Known Allergies    Social History   Socioeconomic History   Marital status: Single    Spouse  name: Not on file   Number of children: Not on file   Years of education: Not on file   Highest education level: Not on file  Occupational History   Not on file  Tobacco Use   Smoking status: Never   Smokeless tobacco: Never  Vaping Use   Vaping status: Never Used  Substance and Sexual Activity   Alcohol use: No   Drug use: No   Sexual activity: Yes    Birth control/protection: Condom  Other Topics Concern   Not on file  Social History Narrative   Not on file   Social Drivers of Health   Financial Resource Strain: Low Risk  (02/24/2017)   Overall Financial Resource Strain (CARDIA)    Difficulty of Paying Living Expenses: Not hard at all  Food Insecurity: No Food Insecurity (02/24/2017)   Hunger  Vital Sign    Worried About Running Out of Food in the Last Year: Never true    Ran Out of Food in the Last Year: Never true  Transportation Needs: No Transportation Needs (02/24/2017)   PRAPARE - Administrator, Civil Service (Medical): No    Lack of Transportation (Non-Medical): No  Physical Activity: Sufficiently Active (12/14/2018)   Exercise Vital Sign    Days of Exercise per Week: 3 days    Minutes of Exercise per Session: 150+ min  Stress: Stress Concern Present (02/24/2017)   [employer] of Occupational Health - Occupational Stress Questionnaire    Feeling of Stress : Very much  Social Connections: Unknown (05/26/2021)   Received from [facility]   Social Network    Social Network: Not on file  Intimate Partner Violence: Unknown (04/17/2021)   Received from [facility]   HITS    Physically Hurt: Not on file    Insult or Talk Down To: Not on file    Threaten Physical Harm: Not on file    Scream or Curse: Not on file      Family History  Problem Relation Age of Onset   HIV Mother    Hypertension Mother    Hypertension Other    Hypertension Maternal Grandmother    Hypertension Paternal Grandmother    Alcohol abuse Neg Hx    Cancer Neg Hx    Early death Neg Hx    Heart disease Neg Hx    Hyperlipidemia Neg Hx    Kidney disease Neg Hx    Stroke Neg Hx    Heart attack Neg Hx    There were no vitals filed for this visit.  Wt Readings from Last 3 Encounters:  07/07/23 (!) 152.9 kg  12/02/22 (!) 153.3 kg  08/05/22 (!) 153.4 kg   Lab Results  Component Value Date   CREATININE 1.60 (H) 07/07/2023   CREATININE 1.63 (H) 12/02/2022   CREATININE 1.96 (H) 08/05/2022    PHYSICAL EXAM:  General: Well appearing. No resp difficulty HEENT: normal Neck: supple, no JVD Cor: Regular rhythm, bradycardic. No rubs, gallops or murmurs Lungs: clear Abdomen: soft, nontender, nondistended. Extremities: no cyanosis, clubbing, rash, trace pitting edema left  lower leg Neuro: alert & oriented X 3. Moves all 4 extremities w/o difficulty. Affect pleasant   ECG: not done   ASSESSMENT & PLAN:  1: NICM with mildly reduced ejection fraction- - suspect this is due to untreated OSA - NYHA class II - euvolemic - weighing daily; understands to call for an overnight weight gain of > 2 pounds or a weekly weight gain of .  5 pounds - weight stable from last visit here 6 months ago   - Echo 02/14/16: EF of 35-40% along with mild Noah/TR.  - Echo 08/25/16: EF of 60-65%. 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[place] - not adding any salt to his food & really trying to watch his sodium intake if he eats out - continue carvedilol  12.5mg  BID - continue jardiance  10mg  daily; commercial copay card provided - continue entresto  97/103mg  BID; commercial copay card provided - continue hydrochlorothiazide  25mg  daily  - continue spironolactone  25mg  daily - BMET today - pro-BNP on 11/04/19 was 82   2: HTN- - BP 136/79 - working night shift - had video visit with PCP Neill) 03/23; he will work on getting PCP around [place] - BMP 12/02/22 reviewed: Na 141, K 4.9, GFR 52 and Scr 1.63 - BMET today  3: OSA- - NS for pulmonology 04/08/21 - Severe Obstructive Sleep Apnea with AHI 72.6/hr.  - Mild Central Sleep Apnea with pAHIc 15.6/hr. - referral made to sleep lab now that he has insurance  4: DM- - A1c 08/05/22 was 6.4% - not currently taking metformin  - A1c today  5: Obesity- - BMI 52.78 kg/m2 - lipid panel today - discussed GLP1 and he says that he's willing to try it if it's approved; will get lab work back first   Explained that since he's now living in [place], he could transition all care to somewhere locally. Patient wants to continue coming here as he says that he drives locally every 1-2 weeks.   Solomon Blumenthal, RN 09/27/23

## 2023-09-28 ENCOUNTER — Other Ambulatory Visit (HOSPITAL_COMMUNITY): Payer: Self-pay

## 2023-09-28 ENCOUNTER — Other Ambulatory Visit
Admission: RE | Admit: 2023-09-28 | Discharge: 2023-09-28 | Disposition: A | Discharge status: Home / Self Care | Payer: Self-pay | Source: Ambulatory Visit | Attending: Medical Genetics | Admitting: Medical Genetics

## 2023-09-28 ENCOUNTER — Ambulatory Visit (HOSPITAL_BASED_OUTPATIENT_CLINIC_OR_DEPARTMENT_OTHER): Admitting: Family

## 2023-09-28 ENCOUNTER — Encounter: Payer: Self-pay | Admitting: Family

## 2023-09-28 ENCOUNTER — Telehealth: Payer: Self-pay

## 2023-09-28 VITALS — BP 141/91 | HR 60 | Wt 334.8 lb

## 2023-09-28 DIAGNOSIS — Z91148 Patient's other noncompliance with medication regimen for other reason: Secondary | ICD-10-CM | POA: Insufficient documentation

## 2023-09-28 DIAGNOSIS — Z7985 Long-term (current) use of injectable non-insulin antidiabetic drugs: Secondary | ICD-10-CM | POA: Insufficient documentation

## 2023-09-28 DIAGNOSIS — I110 Hypertensive heart disease with heart failure: Secondary | ICD-10-CM | POA: Insufficient documentation

## 2023-09-28 DIAGNOSIS — I428 Other cardiomyopathies: Secondary | ICD-10-CM | POA: Diagnosis not present

## 2023-09-28 DIAGNOSIS — Z6841 Body Mass Index (BMI) 40.0 and over, adult: Secondary | ICD-10-CM | POA: Insufficient documentation

## 2023-09-28 DIAGNOSIS — G4733 Obstructive sleep apnea (adult) (pediatric): Secondary | ICD-10-CM | POA: Diagnosis not present

## 2023-09-28 DIAGNOSIS — I5032 Chronic diastolic (congestive) heart failure: Secondary | ICD-10-CM | POA: Diagnosis present

## 2023-09-28 DIAGNOSIS — E669 Obesity, unspecified: Secondary | ICD-10-CM | POA: Insufficient documentation

## 2023-09-28 DIAGNOSIS — Z79899 Other long term (current) drug therapy: Secondary | ICD-10-CM | POA: Insufficient documentation

## 2023-09-28 DIAGNOSIS — Z7984 Long term (current) use of oral hypoglycemic drugs: Secondary | ICD-10-CM | POA: Diagnosis not present

## 2023-09-28 DIAGNOSIS — E119 Type 2 diabetes mellitus without complications: Secondary | ICD-10-CM | POA: Diagnosis not present

## 2023-09-28 DIAGNOSIS — Z794 Long term (current) use of insulin: Secondary | ICD-10-CM

## 2023-09-28 DIAGNOSIS — I10 Essential (primary) hypertension: Secondary | ICD-10-CM | POA: Diagnosis not present

## 2023-09-28 DIAGNOSIS — I5022 Chronic systolic (congestive) heart failure: Secondary | ICD-10-CM | POA: Diagnosis not present

## 2023-09-28 MED ORDER — SACUBITRIL-VALSARTAN 97-103 MG PO TABS: 1.0000 | ORAL_TABLET | Freq: Two times a day (BID) | ORAL | 3 refills | Status: AC

## 2023-09-28 MED ORDER — TRULICITY 0.75 MG/0.5ML ~~LOC~~ SOAJ: 0.7500 mg | SUBCUTANEOUS | 1 refills | Status: DC

## 2023-09-28 MED ORDER — EMPAGLIFLOZIN 10 MG PO TABS: 10.0000 mg | ORAL_TABLET | Freq: Every day | ORAL | 3 refills | Status: AC

## 2023-09-28 NOTE — Patient Instructions (Addendum)
 It was good to see you today.  Medication Changes:  START Trulicity  0.75mg  weekly  Lab Work:  Go down to [facility] in the [place] to have your blood work completed.  We will only call you if the results are abnormal or if the provider would like to make medication changes.  No news is good news.   Follow-Up in: Please follow up with the Advanced Heart Failure Clinic in 1 month with Noah Class, FNP.   Thank you for choosing [facility] Advanced Heart Failure Clinic.    At the Advanced Heart Failure Clinic, you and your health needs are our priority. We have a designated team specialized in the treatment of Heart Failure. This Care Team includes your primary Heart Failure Specialized Cardiologist (physician), Advanced Practice Providers (APPs- Physician Assistants and Nurse Practitioners), and Pharmacist who all work together to provide you with the care you need, when you need it.   You may see any of the following providers on your designated Care Team at your next follow up:  Dr. Toribio Fuel Dr. Ezra Shuck Dr. Ria Commander Dr. Morene Brownie Noah Class, FNP Jaun Bash, RPH-CPP  Please be sure to bring in all your medications bottles to every appointment.   Need to Contact Us :  If you have any questions or concerns before your next appointment please send us  a message through [place] or call our office at [phone].    TO LEAVE A MESSAGE FOR THE NURSE SELECT OPTION 2, PLEASE LEAVE A MESSAGE INCLUDING: YOUR NAME DATE OF BIRTH CALL BACK NUMBER REASON FOR CALL**this is important as we prioritize the call backs  YOU WILL RECEIVE A CALL BACK THE SAME DAY AS LONG AS YOU CALL BEFORE 4:00 PM

## 2023-09-28 NOTE — Telephone Encounter (Signed)
 Advanced Heart Failure Patient Advocate Encounter  Prior authorization for Jardiance  has been submitted and approved. Test billing returns $90 for 90 day supply. This patient is eligible to use copay savings card which would reduce cost.  Key: [id number] Effective: 09/28/2023 to 09/27/2024  Rachel DEL, CPhT Rx Patient Advocate Phone: [phone]

## 2023-09-29 ENCOUNTER — Other Ambulatory Visit (HOSPITAL_COMMUNITY): Payer: Self-pay

## 2023-09-29 ENCOUNTER — Ambulatory Visit: Payer: Self-pay | Admitting: Family

## 2023-09-29 LAB — BASIC METABOLIC PANEL WITH GFR
BUN/Creatinine Ratio: 16 (ref 9–20)
BUN: 28 mg/dL — ABNORMAL HIGH (ref 6–24)
CO2: 22 mmol/L (ref 20–29)
Calcium: 9.6 mg/dL (ref 8.7–10.2)
Chloride: 101 mmol/L (ref 96–106)
Creatinine, Ser: 1.7 mg/dL — ABNORMAL HIGH (ref 0.76–1.27)
Glucose: 92 mg/dL (ref 70–99)
Potassium: 4.2 mmol/L (ref 3.5–5.2)
Sodium: 140 mmol/L (ref 134–144)
eGFR: 49 mL/min/1.73 — ABNORMAL LOW (ref >59)

## 2023-09-30 ENCOUNTER — Other Ambulatory Visit (HOSPITAL_COMMUNITY): Payer: Self-pay

## 2023-10-07 LAB — GENECONNECT MOLECULAR SCREEN: Genetic Analysis Overall Interpretation: NEGATIVE

## 2023-10-31 ENCOUNTER — Telehealth: Payer: Self-pay | Admitting: Family

## 2023-10-31 NOTE — Progress Notes (Unsigned)
 Advanced Heart Failure Clinic Note    PCP: None Cardiologist: None   Chief Complaint: fatigue   HPI:  Mr Sanjuan is a 47 y/o male with a history of obstructive sleep apnea, obesity, hypokalemia, gout, asthma and chronic heart failure.   Has not been admitted or been in the ED in the last 6 months.   Echo 02/14/16: EF of 35-40% along with mild MR/TR.  Echo 08/25/16: EF of 60-65%.  Echo 08/07/21: EF of 40%-45%.  Echo 09/14/23: EF of 55-60%   Cardiac catheterization was done 09/17/14 and showed an EF of 20-25% at that time. No CAD present.   He presents today for a HF follow-up visit  with a chief complaint of fatigue. Reports feeling sluggish and has decreased appetite since restarting Jardiance  and Entresto  ~ 3 weeks ago. He has not started Zepbound due to need of need of sleep study for sleep apnea. Remains open to GLP1 for weight loss management.   Denies shortness of breath, chest pain, productive cough, abdominal distention, palpitations, dizziness, pedal edema, and sleep difficulty.   Continues to work night shift and makes frequent trips from [place] to [place] to see family. Not adding salt to food but continues to eat out during these times. Has reduced daily fluid intake. Denies smoking and alcohol use.   Does have insurance. He is working on finding a PCP. He also endorses he needs to schedule OSA appointment.   ROS: All systems negative except what is listed in HPI, PMH and Problem List  Past Medical History:  Diagnosis Date   Acute kidney injury    Asthma    Chronic combined systolic and diastolic CHF (congestive heart failure) (HCC)    EF < 25% on Cath (09/17/2014)   Gout    Gout of big toe 09/16/2014   H/O medication noncompliance    Hypertension    Hypertensive heart disease    Hypokalemia    MI (myocardial infarction) [facility])    Patient denies having had a MI   NICM (nonischemic cardiomyopathy) (HCC)    a. cardiac cath 09/2014 with normal coronary arteries, EF  20-25% with severe global HK; b. echo 08/2014: EF 30-35%, false tendon in LV aepx of no clinical sig, diffuse HK, GR1DD, aortic root 40 mm, trivial MR, trivial pericardial effusion posterior    Obesity, Class III, BMI 40-49.9 (morbid obesity)    Sleep apnea    a. sleep study 05/2010; b. noncompliant with CPAP   Ventral hernia    a. incarcerated omentum 06/10/2015    Current Outpatient Medications  Medication Sig Dispense Refill   acetaminophen  (TYLENOL ) 650 MG CR tablet Take 650 mg by mouth every 8 (eight) hours as needed for pain.     carvedilol  (COREG ) 12.5 MG tablet TAKE 2 TABLETS BY MOUTH TWICE DAILY WITH A MEAL 90 tablet 0   Dulaglutide  (TRULICITY ) 0.75 MG/0.5ML SOAJ Inject 0.75 mg into the skin once a week. 2 mL 1   empagliflozin  (JARDIANCE ) 10 MG TABS tablet Take 1 tablet (10 mg total) by mouth daily. 90 tablet 3   hydrochlorothiazide  (HYDRODIURIL ) 25 MG tablet Take 1 tablet (25 mg total) by mouth daily. 90 tablet 3   Multiple Vitamin (MULTIVITAMIN) tablet Take 1 tablet by mouth daily. [employer] Energy One daily Multi Vitamin (Patient not taking: Reported on 09/28/2023)     sacubitril -valsartan  (ENTRESTO ) 97-103 MG Take 1 tablet by mouth 2 (two) times daily. 180 tablet 3   spironolactone  (ALDACTONE ) 25 MG tablet Take 1  tablet by mouth once daily 30 tablet 5   No current facility-administered medications for this visit.    No Known Allergies    Social History   Socioeconomic History   Marital status: Single    Spouse name: Not on file   Number of children: Not on file   Years of education: Not on file   Highest education level: Not on file  Occupational History   Not on file  Tobacco Use   Smoking status: Never   Smokeless tobacco: Never  Vaping Use   Vaping status: Never Used  Substance and Sexual Activity   Alcohol use: No   Drug use: No   Sexual activity: Yes    Birth control/protection: Condom  Other Topics Concern   Not on file  Social History Narrative   Not  on file   Social Drivers of Health   Financial Resource Strain: Low Risk  (02/24/2017)   Overall Financial Resource Strain (CARDIA)    Difficulty of Paying Living Expenses: Not hard at all  Food Insecurity: No Food Insecurity (02/24/2017)   Hunger Vital Sign    Worried About Running Out of Food in the Last Year: Never true    Ran Out of Food in the Last Year: Never true  Transportation Needs: No Transportation Needs (02/24/2017)   PRAPARE - Administrator, Civil Service (Medical): No    Lack of Transportation (Non-Medical): No  Physical Activity: Sufficiently Active (12/14/2018)   Exercise Vital Sign    Days of Exercise per Week: 3 days    Minutes of Exercise per Session: 150+ min  Stress: Stress Concern Present (02/24/2017)   [employer] of Occupational Health - Occupational Stress Questionnaire    Feeling of Stress : Very much  Social Connections: Unknown (05/26/2021)   [facility] from [facility]   Social Network    Social Network: Not on file  Intimate Partner Violence: Unknown (04/17/2021)   Received from [facility]   HITS    Physically Hurt: Not on file    Insult or Talk Down To: Not on file    Threaten Physical Harm: Not on file    Scream or Curse: Not on file      Family History  Problem Relation Age of Onset   HIV Mother    Hypertension Mother    Hypertension Other    Hypertension Maternal Grandmother    Hypertension Paternal Grandmother    Alcohol abuse Neg Hx    Cancer Neg Hx    Early death Neg Hx    Heart disease Neg Hx    Hyperlipidemia Neg Hx    Kidney disease Neg Hx    Stroke Neg Hx    Heart attack Neg Hx    There were no vitals filed for this visit. Vitals:   11/01/23 1020  BP: 122/83  Pulse: 72  SpO2: 99%  Weight: (!) 150.7 kg   Wt Readings from Last 3 Encounters:  11/01/23 (!) 150.7 kg  09/28/23 (!) 151.9 kg  07/07/23 (!) 152.9 kg    Lab Results  Component Value Date   CREATININE 1.70 (H) 09/28/2023   CREATININE 1.60  (H) 07/07/2023   CREATININE 1.63 (H) 12/02/2022    PHYSICAL EXAM:  General: Well appearing, obese male. No acute signs of distress.  HEENT: Normal Neck: Supple, no JVD Cor: Regular rhythm, bradycardic. No rubs, gallops or murmurs.  Lungs: Clear bilaterally, symmetrical chest expansion.  Abdomen: Soft, nontender, nondistended. Extremities: No cyanosis, clubbing, rash,  and edema.  Neuro: AO X 4. Moves all 4 extremities w/o difficulty. Affect pleasant   ECG: not done  ASSESSMENT & PLAN:  1: NICM with mildly reduced ejection fraction- - suspect this is due to untreated OSA - NYHA class II - euvolemic - weighing daily; understands to call for an overnight weight gain of > 2 pounds or a weekly weight gain of > 5 pounds - weight  down 2 lbs from last visit here 1 month ago   - Echo 02/14/16: EF of 35-40% along with mild MR/TR.  - Echo 08/25/16: EF of 60-65%. 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[place] - not adding any salt to his food but continues to eat out  - reinforced need to monitor sodium intake if he eats out - continue carvedilol  12.5mg  BID - continue jardiance  10mg  daily - continue entresto  97/103mg  BID - continue hydrochlorothiazide  25mg  daily  - continue spironolactone  25mg  daily - pro-BNP on 11/04/19 was 82  - BMET today  2: HTN- - BP 122/83, well maintained on entresto   - Maintain BP <140/90 and encouraged use of at home BP cuff  - Working night shift - Had video visit with PCP Neill) 03/23, provided with list of PCPs in [place]  - BMP 09/28/23 reviewed: Na 140, K 4.2, Cr 1.70 and GFR 49 - BMET today  3: OSA- - NS for pulmonology 04/08/21 - Severe Obstructive Sleep Apnea with AHI 72.6/hr.  - Mild Central Sleep Apnea with pAHIc 15.6/hr. - Referral made for at home sleep study today   4: DM- - A1C 07/07/23 was 5.7% - Stable, not currently taking metformin   5: Obesity- - BMI 52.04 kg/m2 -  LDL 72 and Triglycerides 76 on 07/07/23 - Aiwating sleep study results to reattempt insurance coverage for Trulicity  or Zepbound.   Follow up after sleep study, sooner if needed.   Ellouise Class, FNP/Patrich Heinze, FNP-S 11/01/23

## 2023-10-31 NOTE — Telephone Encounter (Signed)
 Called to confirm/remind patient of their appointment at the Advanced Heart Failure Clinic on 11/01/23.   Appointment:   [x] Confirmed  [] Left mess   [] No answer/No voice mail  [] VM Full/unable to leave message  [] Phone not in service  Patient reminded to bring all medications and/or complete list.  Confirmed patient has transportation. Gave directions, instructed to utilize valet parking.

## 2023-11-01 ENCOUNTER — Encounter: Payer: Self-pay | Admitting: Family

## 2023-11-01 ENCOUNTER — Ambulatory Visit: Attending: Family | Admitting: Family

## 2023-11-01 VITALS — BP 122/83 | HR 72 | Wt 332.2 lb

## 2023-11-01 DIAGNOSIS — Z79899 Other long term (current) drug therapy: Secondary | ICD-10-CM | POA: Diagnosis not present

## 2023-11-01 DIAGNOSIS — I428 Other cardiomyopathies: Secondary | ICD-10-CM | POA: Insufficient documentation

## 2023-11-01 DIAGNOSIS — I110 Hypertensive heart disease with heart failure: Secondary | ICD-10-CM | POA: Insufficient documentation

## 2023-11-01 DIAGNOSIS — G4733 Obstructive sleep apnea (adult) (pediatric): Secondary | ICD-10-CM | POA: Diagnosis not present

## 2023-11-01 DIAGNOSIS — M109 Gout, unspecified: Secondary | ICD-10-CM | POA: Diagnosis not present

## 2023-11-01 DIAGNOSIS — E876 Hypokalemia: Secondary | ICD-10-CM | POA: Insufficient documentation

## 2023-11-01 DIAGNOSIS — Z6841 Body Mass Index (BMI) 40.0 and over, adult: Secondary | ICD-10-CM | POA: Diagnosis not present

## 2023-11-01 DIAGNOSIS — I10 Essential (primary) hypertension: Secondary | ICD-10-CM

## 2023-11-01 DIAGNOSIS — I5042 Chronic combined systolic (congestive) and diastolic (congestive) heart failure: Secondary | ICD-10-CM | POA: Insufficient documentation

## 2023-11-01 DIAGNOSIS — E119 Type 2 diabetes mellitus without complications: Secondary | ICD-10-CM | POA: Diagnosis not present

## 2023-11-01 DIAGNOSIS — I5032 Chronic diastolic (congestive) heart failure: Secondary | ICD-10-CM

## 2023-11-01 DIAGNOSIS — Z7984 Long term (current) use of oral hypoglycemic drugs: Secondary | ICD-10-CM | POA: Diagnosis not present

## 2023-11-01 DIAGNOSIS — G4731 Primary central sleep apnea: Secondary | ICD-10-CM | POA: Diagnosis not present

## 2023-11-01 DIAGNOSIS — J45909 Unspecified asthma, uncomplicated: Secondary | ICD-10-CM | POA: Diagnosis not present

## 2023-11-01 NOTE — Patient Instructions (Addendum)
 Medication Changes:  No medication changes.   Lab Work:  Go downstairs to [facility] on LOWER LEVEL to have your blood work completed.  We will only call you if the results are abnormal or if the provider would like to make medication changes.  No news is good news.   Testing/Procedures:  We have given you a sleep study box. Please do NOT open your sleep study box until we call you with your insurance's approval. Insurance companies are a little short staffed currently, so it can take up to a few weeks to receive the approval. Your pin will be [id number].    Primary Care Office:  [facility] [address] [place], [place] [address] [phone] Provider: Melanie DASEN. Cannady   Follow-Up in: after your sleep study has been completed.   Thank you for choosing [facility] Advanced Heart Failure Clinic.    At the Advanced Heart Failure Clinic, you and your health needs are our priority. We have a designated team specialized in the treatment of Heart Failure. This Care Team includes your primary Heart Failure Specialized Cardiologist (physician), Advanced Practice Providers (APPs- Physician Assistants and Nurse Practitioners), and Pharmacist who all work together to provide you with the care you need, when you need it.   You may see any of the following providers on your designated Care Team at your next follow up:  Dr. Toribio Fuel Dr. Ezra Shuck Dr. Ria Commander Dr. Morene Brownie Ellouise Class, FNP Jaun Bash, RPH-CPP  Please be sure to bring in all your medications bottles to every appointment.   Need to Contact Us :  If you have any questions or concerns before your next appointment please send us  a message through [place] or call our office at [phone].    TO LEAVE A MESSAGE FOR THE NURSE SELECT OPTION 2, PLEASE LEAVE A MESSAGE INCLUDING: YOUR NAME DATE OF BIRTH CALL BACK NUMBER REASON FOR CALL**this is important as we prioritize the call  backs  YOU WILL RECEIVE A CALL BACK THE SAME DAY AS LONG AS YOU CALL BEFORE 4:00 PM

## 2023-11-01 NOTE — Progress Notes (Signed)
 Patient Name:         DOB:       Height:     Weight:  Office Name:         Referring Provider:  Today's Date:  Date:   STOP BANG RISK ASSESSMENT S (snore) Have you been told that you snore?     YES   T (tired) Are you often tired, fatigued, or sleepy during the day?   YES  O (obstruction) Do you stop breathing, choke, or gasp during sleep? YES   P (pressure) Do you have or are you being treated for high blood pressure? YES   B (BMI) Is your body index greater than 35 kg/m? YES   A (age) Are you 59 years old or older? NO   N (neck) Do you have a neck circumference greater than 16 inches?   NO   G (gender) Are you a male? YES   TOTAL STOP/BANG "YES" ANSWERS 6                                                                       For Office Use Only              Procedure Order Form    YES to 3+ Stop Bang questions OR two clinical symptoms - patient qualifies for WatchPAT (CPT 95800)             Clinical Notes: Will consult Sleep Specialist and refer for management of therapy due to patient increased risk of Sleep Apnea. Ordering a sleep study due to the following two clinical symptoms: Excessive daytime sleepiness G47.10 /  History of high blood pressure R03.0    I understand that I am proceeding with a home sleep apnea test as ordered by my treating physician. I understand that untreated sleep apnea is a serious cardiovascular risk factor and it is my responsibility to perform the test and seek management for sleep apnea. I will be contacted with the results and be managed for sleep apnea by a local sleep physician. I will be receiving equipment and further instructions from [facility]. I shall promptly ship back the equipment via the included mailing label. I understand my insurance will be billed for the test and as the patient I am responsible for any insurance related out-of-pocket costs incurred. I have been provided with written instructions and can call for  additional video or telephonic instruction, with 24-hour availability of qualified personnel to answer any questions: Patient Help Desk [phone].  Patient Signature ______________________________________________________   Date______________________ Patient Telemedicine Verbal Consent

## 2023-11-01 NOTE — Progress Notes (Signed)
 ITAMAR home sleep study given to patient, all instructions explained, waiver signed, and CLOUDPAT registration complete.

## 2023-11-02 ENCOUNTER — Ambulatory Visit: Payer: Self-pay | Admitting: Family

## 2023-11-02 LAB — BASIC METABOLIC PANEL WITH GFR
BUN/Creatinine Ratio: 15 (ref 9–20)
BUN: 26 mg/dL — ABNORMAL HIGH (ref 6–24)
CO2: 25 mmol/L (ref 20–29)
Calcium: 9.3 mg/dL (ref 8.7–10.2)
Chloride: 101 mmol/L (ref 96–106)
Creatinine, Ser: 1.77 mg/dL — ABNORMAL HIGH (ref 0.76–1.27)
Glucose: 89 mg/dL (ref 70–99)
Potassium: 4.8 mmol/L (ref 3.5–5.2)
Sodium: 139 mmol/L (ref 134–144)
eGFR: 47 mL/min/1.73 — ABNORMAL LOW (ref >59)

## 2023-11-03 ENCOUNTER — Telehealth (HOSPITAL_COMMUNITY): Payer: Self-pay

## 2023-11-03 ENCOUNTER — Telehealth: Payer: Self-pay

## 2023-11-03 NOTE — Telephone Encounter (Signed)
-----   Message from CMA Philicia B sent at 11/03/2023 11:05 AM EDT ----- Regarding: RE: itamar precert No pre cert required. ----- Message ----- From: Sharl Laymon LABOR, RN Sent: 11/01/2023   3:51 PM EDT To: Fausto JONELLE Ross, CMA Subject: itamar precert                                  Test: itamar  Insurance: aetna  CPT:  if known  Location: home  Dx: excessive daytime sleepiness  Provider: ellouise class  Scheduled Date:  if known

## 2023-11-03 NOTE — Telephone Encounter (Signed)
-----   Message from Nurse [place] R sent at 11/01/2023  3:49 PM EDT ----- Regarding: itamar precert  Test: itamar  Insurance: aetna  CPT:  if known  Location: home  Dx: excessive daytime sleepiness  Provider: ellouise class  Scheduled Date:  if known

## 2023-11-03 NOTE — Telephone Encounter (Signed)
No precert required 

## 2023-11-03 NOTE — Telephone Encounter (Signed)
 Called pt and made aware that he can complete Itamar sleep study at his convenience. Pt agreeable and verbalized understanding.

## 2023-11-22 ENCOUNTER — Encounter (HOSPITAL_BASED_OUTPATIENT_CLINIC_OR_DEPARTMENT_OTHER): Admitting: Cardiology

## 2023-11-22 DIAGNOSIS — G4733 Obstructive sleep apnea (adult) (pediatric): Secondary | ICD-10-CM

## 2023-11-23 ENCOUNTER — Ambulatory Visit: Attending: Family

## 2023-11-23 DIAGNOSIS — I5032 Chronic diastolic (congestive) heart failure: Secondary | ICD-10-CM

## 2023-11-23 DIAGNOSIS — G4733 Obstructive sleep apnea (adult) (pediatric): Secondary | ICD-10-CM

## 2023-11-23 NOTE — Procedures (Signed)
     SLEEP STUDY REPORT Patient Information Study Date: 11/23/2023 Patient Name: Noah Rodriguez Patient ID: [id number] Birth Date: 11-17-1975 Age: 48 Gender: BMI: 52.6 (W=335 lb, H=5' 7'') Stopbang: 6 Referring Physician: ellouise class, NP  TEST DESCRIPTION: Home sleep apnea testing was completed using the WatchPat, a Type 1 device, utilizing peripheral arterial tonometry (PAT), chest movement, actigraphy, pulse oximetry, pulse rate, body position and snore. AHI was calculated with apnea and hypopnea using valid sleep time as the denominator. RDI includes apneas, hypopneas, and RERAs. The data acquired and the scoring of sleep and all associated events were performed in accordance with the recommended standards and specifications as outlined in the AASM Manual for the Scoring of Sleep and Associated Events 2.2.0 (2015).  FINDINGS:  1. Moderate Obstructive Sleep Apnea with AHI 22.8/hr.  2. No Central Sleep Apnea with pAHIc 2.5/hr.  3. Oxygen desaturations as low as 83%.  4. Moderate snoring was present. O2 sats were < 88% for 4.3 min.  5. Total sleep time was 5 hrs and 12 min.  6. 34.9% of total sleep time was spent in REM sleep.  7. Shortened sleep onset latency at 6 min  8. Shortened REM sleep onset latency at 75 min.  9. Total awakenings were 14. 10. Arrhythmia detection: None.  DIAGNOSIS: Moderate Obstructive Sleep Apnea (G47.33)  RECOMMENDATIONS: 1. Clinical correlation of these findings is necessary. The decision to treat obstructive sleep apnea (OSA) is usually based on the presence of apnea symptoms or the presence of associated medical conditions such as Hypertension, Congestive Heart Failure, Atrial Fibrillation or Obesity. The most common symptoms of OSA are snoring, gasping for breath while sleeping, daytime sleepiness and fatigue. 2. Initiating apnea therapy is recommended given the presence of symptoms and/or associated conditions. Recommend proceeding with  one of the following:  a. Auto-CPAP therapy with a pressure range of 5-20cm H2O.  b. An oral appliance (OA) that can be obtained from certain dentists with expertise in sleep medicine. These are primarily of use in non-obese patients with mild and moderate disease.  c. An ENT consultation which may be useful to look for specific causes of obstruction and possible treatment options.  d. If patient is intolerant to PAP therapy, consider referral to ENT for evaluation for hypoglossal nerve stimulator. 3. Close follow-up is necessary to ensure success with CPAP or oral appliance therapy for maximum benefit . 4. A follow-up oximetry study on CPAP is recommended to assess the adequacy of therapy and determine the need for supplemental oxygen or the potential need for Bi-level therapy. An arterial blood gas to determine the adequacy of baseline ventilation and oxygenation should also be considered. 5. Healthy sleep recommendations include: adequate nightly sleep (normal 7-9 hrs/night), avoidance of caffeine after noon and alcohol near bedtime, and maintaining a sleep environment that is cool, dark and quiet. 6. Weight loss for overweight patients is recommended. Even modest amounts of weight loss can significantly improve the severity of sleep apnea. 7. Snoring recommendations include: weight loss where appropriate, side sleeping, and avoidance of alcohol before bed. 8. Operation of motor vehicle should not be performed when sleepy.  Signature: Wilbert Bihari, MD; [facility]; Diplomat, American Board of Sleep Medicine Electronically Signed: 11/23/2023 8:42:38 PM

## 2023-12-06 ENCOUNTER — Telehealth: Payer: Self-pay

## 2023-12-13 ENCOUNTER — Ambulatory Visit: Admitting: Family

## 2023-12-21 ENCOUNTER — Other Ambulatory Visit: Payer: Self-pay | Admitting: Family

## 2023-12-22 ENCOUNTER — Telehealth: Payer: Self-pay | Admitting: *Deleted

## 2023-12-22 DIAGNOSIS — R0683 Snoring: Secondary | ICD-10-CM

## 2023-12-22 DIAGNOSIS — I5032 Chronic diastolic (congestive) heart failure: Secondary | ICD-10-CM

## 2023-12-22 DIAGNOSIS — I5022 Chronic systolic (congestive) heart failure: Secondary | ICD-10-CM

## 2023-12-22 DIAGNOSIS — G4733 Obstructive sleep apnea (adult) (pediatric): Secondary | ICD-10-CM

## 2023-12-22 DIAGNOSIS — I10 Essential (primary) hypertension: Secondary | ICD-10-CM

## 2023-12-22 NOTE — Telephone Encounter (Signed)
-----   Message from Wilbert Bihari, MD sent at 11/23/2023  8:43 PM EST ----- Please let patient know that they have sleep apnea.  Recommend therapeutic CPAP titration for treatment of patient's sleep disordered breathing.

## 2023-12-22 NOTE — Telephone Encounter (Signed)
 The patient has been notified of the result via his mychart voicemail was full.

## 2023-12-27 NOTE — Addendum Note (Signed)
 Addended by: JOSHUA DALTON MATSU on: 12/27/2023 03:43 PM   Modules accepted: Orders

## 2023-12-27 NOTE — Telephone Encounter (Signed)
 The patient has been notified of the result and verbalized understanding.  All questions (if any) were answered. Joshua Dalton Seip, CMA 12/27/2023 3:32 PM    Precert titration

## 2024-01-11 ENCOUNTER — Telehealth: Payer: Self-pay | Admitting: Family

## 2024-01-11 DIAGNOSIS — R7303 Prediabetes: Secondary | ICD-10-CM | POA: Insufficient documentation

## 2024-01-11 NOTE — Patient Instructions (Signed)

## 2024-01-11 NOTE — Telephone Encounter (Signed)
 Called to confirm/remind patient of their appointment at the [facility] on 01/13/24.   Appointment:   [x] Confirmed  [] Left mess   [] No answer/No voice mail  [] VM Full/unable to leave message  [] Phone not in service  Patient reminded to bring all medications and/or complete list.  Confirmed patient has transportation. Gave directions, instructed to utilize valet parking.

## 2024-01-12 NOTE — Progress Notes (Unsigned)
 "  [facility] Note    PCP: None Cardiologist: None   Chief Complaint: fatigue   HPI:  Noah Rodriguez is a 49 y/o male with a history of obstructive sleep apnea, obesity, hypokalemia, gout, asthma and chronic heart failure.   Has not been admitted or been in the ED in the last 6 months.   Cardiac catheterization was done 09/17/14 and showed an EF of 20-25% at that time. No CAD present.   Echo 02/14/16: EF of 35-40% along with mild Noah/TR.  Echo 08/25/16: EF of 60-65%.  Echo 08/07/21: EF of 40%-45%.  Echo 09/14/23: EF of 55-60%   Sleep study done 11/23/23 which showed moderate sleep apnea, AHI 22.8 / hr  He presents today for a HF follow-up visit  with a chief complaint of minimal fatigue. Has associated bilateral knee pain, pain on the bottom of left foot, worsening eczema. Denies shortness of breath, chest pain, palpitations, dizziness, edema. Waiting on insurance to get CPAP approval.  Is asking about getting a colonoscopy as his younger brother was recently diagnosed with colon cancer and is undergoing treatment.   Continues to work night shift and makes frequent trips from [place] to [place] to see family. Not adding salt to food but continues to eat out during these times. Has reduced daily fluid intake. Denies smoking and alcohol use. Has upcoming PCP appointment next week to get established.   ROS: All systems negative except what is listed in HPI, PMH and Problem List  Past Medical History:  Diagnosis Date   Acute kidney injury    Asthma    Chronic combined systolic and diastolic CHF (congestive heart failure) (HCC)    EF < 25% on Cath (09/17/2014)   Gout    Gout of big toe 09/16/2014   H/O medication noncompliance    Hypertension    Hypertensive heart disease    Hypokalemia    MI (myocardial infarction) [facility])    Patient denies having had a MI   NICM (nonischemic cardiomyopathy) (HCC)    a. cardiac cath 09/2014 with normal coronary arteries, EF 20-25% with  severe global HK; b. echo 08/2014: EF 30-35%, false tendon in LV aepx of no clinical sig, diffuse HK, GR1DD, aortic root 40 mm, trivial Noah, trivial pericardial effusion posterior    Obesity, Class III, BMI 40-49.9 (morbid obesity) (HCC)    Sleep apnea    a. sleep study 05/2010; b. noncompliant with CPAP   Ventral hernia    a. incarcerated omentum 06/10/2015    Current Outpatient Medications  Medication Sig Dispense Refill   acetaminophen  (TYLENOL ) 650 MG CR tablet Take 650 mg by mouth every 8 (eight) hours as needed for pain.     carvedilol  (COREG ) 12.5 MG tablet TAKE 2 TABLETS BY MOUTH TWICE DAILY WITH A MEAL 90 tablet 0   empagliflozin  (JARDIANCE ) 10 MG TABS tablet Take 1 tablet (10 mg total) by mouth daily. 90 tablet 3   hydrochlorothiazide  (HYDRODIURIL ) 25 MG tablet Take 1 tablet by mouth once daily 30 tablet 5   Multiple Vitamin (MULTIVITAMIN) tablet Take 1 tablet by mouth daily. [employer] One daily Multi Vitamin     sacubitril -valsartan  (ENTRESTO ) 97-103 MG Take 1 tablet by mouth 2 (two) times daily. 180 tablet 3   spironolactone  (ALDACTONE ) 25 MG tablet Take 1 tablet by mouth once daily 30 tablet 5   No current facility-administered medications for this visit.    No Known Allergies    Social History  Socioeconomic History   Marital status: Single    Spouse name: Not on file   Number of children: Not on file   Years of education: Not on file   Highest education level: Not on file  Occupational History   Not on file  Tobacco Use   Smoking status: Never   Smokeless tobacco: Never  Vaping Use   Vaping status: Never Used  Substance and Sexual Activity   Alcohol use: No   Drug use: No   Sexual activity: Yes    Birth control/protection: Condom  Other Topics Concern   Not on file  Social History Narrative   Not on file   Social Drivers of Health   Tobacco Use: Low Risk (12/01/2023)   Received from [facility]   Patient History    Smoking Tobacco Use:  Never    Smokeless Tobacco Use: Never    Passive Exposure: Not on file  Financial Resource Strain: Not on file  Food Insecurity: Not on file  Transportation Needs: Not on file  Physical Activity: Not on file  Stress: Not on file  Social Connections: Unknown (05/26/2021)   Received from [facility]   Social Network    Social Network: Not on file  Intimate Partner Violence: Unknown (04/17/2021)   Received from [facility]   HITS    Physically Hurt: Not on file    Insult or Talk Down To: Not on file    Threaten Physical Harm: Not on file    Scream or Curse: Not on file  Depression (PHQ2-9): Low Risk (11/04/2021)   Depression (PHQ2-9)    PHQ-2 Score: 0  Alcohol Screen: Not on file  Housing: Not on file  Utilities: Not on file  Health Literacy: Not on file      Family History  Problem Relation Age of Onset   HIV Mother    Hypertension Mother    Hypertension Other    Hypertension Maternal Grandmother    Hypertension Paternal Grandmother    Alcohol abuse Neg Hx    Cancer Neg Hx    Early death Neg Hx    Heart disease Neg Hx    Hyperlipidemia Neg Hx    Kidney disease Neg Hx    Stroke Neg Hx    Heart attack Neg Hx     Vitals:   01/13/24 1022  BP: 100/69  Pulse: 64  SpO2: 99%  Weight: (!) 336 lb 6.4 oz (152.6 kg)   Wt Readings from Last 3 Encounters:  01/13/24 (!) 336 lb 6.4 oz (152.6 kg)  11/01/23 (!) 332 lb 4 oz (150.7 kg)  09/28/23 (!) 334 lb 12.8 oz (151.9 kg)   Lab Results  Component Value Date   CREATININE 1.77 (H) 11/01/2023   CREATININE 1.70 (H) 09/28/2023   CREATININE 1.60 (H) 07/07/2023    PHYSICAL EXAM:  General: Well appearing obese male.  Cor: No JVD. Regular rhythm, rate.  Lungs: clear Abdomen: soft, nontender, nondistended. Extremities: no edema. Dry discolored skin in antecubital bilaterally with L>R Neuro:. Affect pleasant    ECG: not done  ASSESSMENT & PLAN:  1: NICM with now preserved ejection fraction- - suspect this is due to  untreated OSA - NYHA class II - euvolemic - weight up 4 pounds from last visit here 2 months ago   - Echo 02/14/16: EF of 35-40% along with mild Noah/TR.  - Echo 08/25/16: EF of 60-65%. - Echo 08/07/21: EF of 40%-45%    - Echo 09/14/23: EF 55-60%  -  working nightshift driving a chief executive officer in [place] - not adding any salt to his food but continues to eat out  - continue carvedilol  12.5mg  BID - continue jardiance  10mg  daily - continue entresto  97/103mg  BID - continue hydrochlorothiazide  25mg  daily  - continue spironolactone  25mg  daily - pro-BNP on 11/04/19 was 82   2: HTN- - BP controlled 100/69 - Continues to work night shift - has upcoming PCP appointment 01/20/24. Emphasized the importance of keeping this appointment to discuss his eczema and need for colonoscopy.  - BMP 11/01/23 reviewed: Na 139, K 4.8, Cr 1.77 and GFR 47 - BMET today  3: OSA- - Sleep study done 11/23/23 which showed moderate sleep apnea, AHI 22.8 / hr - CPAP being titrated by Dr. Dorine office although he's currently waiting on insurance to approve CPAP  4: DM- - A1C 07/07/23 was 5.7%. A1c today - Stable, not currently taking metformin   5: Obesity- - BMI 52.04 kg/m2 - LDL 72 and Triglycerides 76 on 07/07/23. Lipid panel today - trulicity  was approved 06/25 but patient says that [facility] kept telling him it wasn't approved. Reached out to [facility] (pharm) who will work on approval again of GLP1   Return in 3 months, sooner if needed.   I spent 35 minutes reviewing records, interviewing/ examing patient and managing plan/ orders.   Ellouise DELENA Class FNP 01/12/2024   "

## 2024-01-13 ENCOUNTER — Telehealth: Payer: Self-pay

## 2024-01-13 ENCOUNTER — Ambulatory Visit: Attending: Family | Admitting: Family

## 2024-01-13 ENCOUNTER — Encounter: Payer: Self-pay | Admitting: Family

## 2024-01-13 ENCOUNTER — Other Ambulatory Visit (HOSPITAL_COMMUNITY): Payer: Self-pay

## 2024-01-13 VITALS — BP 100/69 | HR 64 | Wt 336.4 lb

## 2024-01-13 DIAGNOSIS — Z79899 Other long term (current) drug therapy: Secondary | ICD-10-CM | POA: Diagnosis not present

## 2024-01-13 DIAGNOSIS — J45909 Unspecified asthma, uncomplicated: Secondary | ICD-10-CM | POA: Insufficient documentation

## 2024-01-13 DIAGNOSIS — G4733 Obstructive sleep apnea (adult) (pediatric): Secondary | ICD-10-CM | POA: Insufficient documentation

## 2024-01-13 DIAGNOSIS — M25561 Pain in right knee: Secondary | ICD-10-CM | POA: Diagnosis not present

## 2024-01-13 DIAGNOSIS — I428 Other cardiomyopathies: Secondary | ICD-10-CM | POA: Insufficient documentation

## 2024-01-13 DIAGNOSIS — I10 Essential (primary) hypertension: Secondary | ICD-10-CM | POA: Diagnosis not present

## 2024-01-13 DIAGNOSIS — I110 Hypertensive heart disease with heart failure: Secondary | ICD-10-CM | POA: Diagnosis not present

## 2024-01-13 DIAGNOSIS — M79672 Pain in left foot: Secondary | ICD-10-CM | POA: Diagnosis not present

## 2024-01-13 DIAGNOSIS — E119 Type 2 diabetes mellitus without complications: Secondary | ICD-10-CM | POA: Insufficient documentation

## 2024-01-13 DIAGNOSIS — M25562 Pain in left knee: Secondary | ICD-10-CM | POA: Insufficient documentation

## 2024-01-13 DIAGNOSIS — Z7984 Long term (current) use of oral hypoglycemic drugs: Secondary | ICD-10-CM | POA: Insufficient documentation

## 2024-01-13 DIAGNOSIS — R7303 Prediabetes: Secondary | ICD-10-CM | POA: Diagnosis not present

## 2024-01-13 DIAGNOSIS — Z6841 Body Mass Index (BMI) 40.0 and over, adult: Secondary | ICD-10-CM | POA: Insufficient documentation

## 2024-01-13 DIAGNOSIS — M109 Gout, unspecified: Secondary | ICD-10-CM | POA: Insufficient documentation

## 2024-01-13 DIAGNOSIS — L309 Dermatitis, unspecified: Secondary | ICD-10-CM | POA: Diagnosis not present

## 2024-01-13 DIAGNOSIS — I252 Old myocardial infarction: Secondary | ICD-10-CM | POA: Insufficient documentation

## 2024-01-13 DIAGNOSIS — I5032 Chronic diastolic (congestive) heart failure: Secondary | ICD-10-CM

## 2024-01-13 LAB — BASIC METABOLIC PANEL WITH GFR
BUN/Creatinine Ratio: 14 (ref 9–20)
BUN: 29 mg/dL — ABNORMAL HIGH (ref 6–24)
CO2: 24 mmol/L (ref 20–29)
Calcium: 9.4 mg/dL (ref 8.7–10.2)
Chloride: 100 mmol/L (ref 96–106)
Creatinine, Ser: 2.07 mg/dL — ABNORMAL HIGH (ref 0.76–1.27)
Glucose: 93 mg/dL (ref 70–99)
Potassium: 4.5 mmol/L (ref 3.5–5.2)
Sodium: 137 mmol/L (ref 134–144)
eGFR: 39 mL/min/1.73 — ABNORMAL LOW

## 2024-01-13 LAB — HEMOGLOBIN A1C
Est. average glucose Bld gHb Est-mCnc: 131 mg/dL
Hgb A1c MFr Bld: 6.2 % — ABNORMAL HIGH (ref 4.8–5.6)

## 2024-01-13 LAB — LIPID PANEL
Chol/HDL Ratio: 3.7 ratio (ref 0.0–5.0)
Cholesterol, Total: 128 mg/dL (ref 100–199)
HDL: 35 mg/dL — ABNORMAL LOW
LDL Chol Calc (NIH): 75 mg/dL (ref 0–99)
Triglycerides: 91 mg/dL (ref 0–149)
VLDL Cholesterol Cal: 18 mg/dL (ref 5–40)

## 2024-01-13 NOTE — Patient Instructions (Addendum)
 It was good to see you today!  Medication Changes:  No medication changes today!  Lab Work:  Go downstairs to [place] [facility] on [address]OWER LEV[address] to have your blood work completed.  We will only call you if the results are abnormal or if the provider would like to make medication changes.  No news is good news.    Follow-Up in: Please follow up with the [facility] in 3 months with Ellouise Class, FNP.   Thank you for choosing [facility].    At the [facility], you and your health needs are our priority. We have a designated team specialized in the treatment of Heart Failure. This Care Team includes your primary Heart Failure Specialized Cardiologist (physician), Advanced Practice Providers (APPs- Physician Assistants and Nurse Practitioners), and Pharmacist who all work together to provide you with the care you need, when you need it.   You may see any of the following providers on your designated Care Team at your next follow up:  Dr. Toribio Fuel Dr. Ezra Shuck Dr. Ria Commander Dr. Morene Brownie Ellouise Class, FNP Jaun Bash, RPH-CPP  Please be sure to bring in all your medications bottles to every appointment.   Need to Contact Us :  If you have any questions or concerns before your next appointment please send us  a message through [place] or call our office at [phone].    TO LEAVE A MESSAGE FOR THE NURSE SELECT OPTION 2, PLEASE LEAVE A MESSAGE INCLUDING: YOUR NAME DATE OF BIRTH CALL BACK NUMBER REASON FOR CALL**this is important as we prioritize the call backs  YOU WILL RECEIVE A CALL BACK THE SAME DAY AS LONG AS YOU CALL BEFORE 4:00 PM

## 2024-01-15 ENCOUNTER — Ambulatory Visit: Payer: Self-pay | Admitting: Family

## 2024-01-15 ENCOUNTER — Other Ambulatory Visit: Payer: Self-pay | Admitting: Family

## 2024-01-15 DIAGNOSIS — I5032 Chronic diastolic (congestive) heart failure: Secondary | ICD-10-CM

## 2024-01-17 ENCOUNTER — Other Ambulatory Visit (HOSPITAL_COMMUNITY): Payer: Self-pay

## 2024-01-18 NOTE — Telephone Encounter (Signed)
**Note De-Identified Gamal Todisco Obfuscation** I started a CPAP Titration PA through the [facility] provider portal and it is currently pending review. Case [id number]

## 2024-01-20 ENCOUNTER — Ambulatory Visit: Admitting: Nurse Practitioner

## 2024-01-20 ENCOUNTER — Encounter: Payer: Self-pay | Admitting: Nurse Practitioner

## 2024-01-20 VITALS — BP 124/89 | HR 64 | Temp 97.6°F | Resp 18 | Ht 67.01 in | Wt 331.4 lb

## 2024-01-20 DIAGNOSIS — Z1159 Encounter for screening for other viral diseases: Secondary | ICD-10-CM

## 2024-01-20 DIAGNOSIS — I130 Hypertensive heart and chronic kidney disease with heart failure and stage 1 through stage 4 chronic kidney disease, or unspecified chronic kidney disease: Secondary | ICD-10-CM | POA: Diagnosis not present

## 2024-01-20 DIAGNOSIS — I5032 Chronic diastolic (congestive) heart failure: Secondary | ICD-10-CM | POA: Diagnosis not present

## 2024-01-20 DIAGNOSIS — N1832 Chronic kidney disease, stage 3b: Secondary | ICD-10-CM | POA: Insufficient documentation

## 2024-01-20 DIAGNOSIS — G4733 Obstructive sleep apnea (adult) (pediatric): Secondary | ICD-10-CM | POA: Diagnosis not present

## 2024-01-20 DIAGNOSIS — M1A0720 Idiopathic chronic gout, left ankle and foot, without tophus (tophi): Secondary | ICD-10-CM | POA: Diagnosis not present

## 2024-01-20 DIAGNOSIS — J4530 Mild persistent asthma, uncomplicated: Secondary | ICD-10-CM

## 2024-01-20 DIAGNOSIS — E66813 Obesity, class 3: Secondary | ICD-10-CM | POA: Diagnosis not present

## 2024-01-20 DIAGNOSIS — I428 Other cardiomyopathies: Secondary | ICD-10-CM

## 2024-01-20 DIAGNOSIS — R7303 Prediabetes: Secondary | ICD-10-CM | POA: Diagnosis not present

## 2024-01-20 DIAGNOSIS — Z1211 Encounter for screening for malignant neoplasm of colon: Secondary | ICD-10-CM

## 2024-01-20 LAB — MICROALBUMIN, URINE WAIVED
Creatinine, Urine Waived: 200 mg/dL (ref 10–300)
Microalb, Ur Waived: 30 mg/L — ABNORMAL HIGH (ref 0–19)
Microalb/Creat Ratio: <30 mg/g

## 2024-01-20 MED ORDER — ALBUTEROL SULFATE HFA 108 (90 BASE) MCG/ACT IN AERS: 2.0000 | INHALATION_SPRAY | Freq: Four times a day (QID) | RESPIRATORY_TRACT | 3 refills | Status: AC | PRN

## 2024-01-20 MED ORDER — TRIAMCINOLONE ACETONIDE 0.1 % EX CREA: 1.0000 | TOPICAL_CREAM | Freq: Two times a day (BID) | CUTANEOUS | 3 refills | Status: AC

## 2024-01-20 NOTE — Assessment & Plan Note (Signed)
 Ongoing with recent A1c 6.2%. Continue Jardiance , could consider increase to 25 MG in future or attempting to get Zepbound due to OSA. Plan to recheck A1c next visit.

## 2024-01-20 NOTE — Assessment & Plan Note (Signed)
 Chronic, recent study on 11/22/23 noted moderate OSA, but he reports insurance denied equipment. Will reach out to Dr. Shlomo and Ellouise on this. Could consider attempting to get Zepbound in future which could benefit OSA and weight.

## 2024-01-20 NOTE — Assessment & Plan Note (Signed)
 BMI 51.89 with HF, OSA, HTN. Recommended eating smaller high protein, low fat meals more frequently and exercising 30 mins a day 5 times a week with a goal of 10-15lb weight loss in the next 3 months. Patient voiced their understanding and motivation to adhere to these recommendations. May benefit attempting to get Zepbound next visit due to his moderate OSA -- will discuss with him next visit.

## 2024-01-20 NOTE — Assessment & Plan Note (Signed)
 Chronic, stable. Albuterol  as needed only, new inhaler sent in today. Could consider spirometry in future if available. Start inhalers as needed.

## 2024-01-20 NOTE — Assessment & Plan Note (Signed)
 Chronic, ongoing. He has been using Ibuprofen for this and recommend he not due to HF and CKD. Will check uric acid level today and discussed with him we could consider renal dose  Allopurinol  to help prevent flares, which would reduce risk of him taking Ibuprofen.

## 2024-01-20 NOTE — Assessment & Plan Note (Signed)
 Chronic, ongoing. Continue to collaborate with HF Clinic, recent note reviewed. Ensure to continue all curren medications.

## 2024-01-20 NOTE — Assessment & Plan Note (Addendum)
 Chronic with some decline on recent two labs. Would benefit nephrology assessment and recommendations. Discussed with patient and he would like to visit them, referral placed. Continue Jardiance  which offers benefit to kidneys. Labs today: CBC, CMP, urine ALB, PTH. Urine ALB 10 February 2024. - Recommend good water intake daily, although not too much due to HF, plus no Ibuprofen use.

## 2024-01-20 NOTE — Assessment & Plan Note (Signed)
 Chronic, ongoing with HF and CKD 3b. Recommend he monitor BP at least a few mornings a week at home and document.  DASH diet at home.  Continue current medication regimen and adjust as needed.  Labs today: CBC, CMP, TSH, urine ALB. Urine ALB 10 February 2024. Would benefit nephrology assessment and recommendations. Discussed with patient and he would like to visit them, referral placed.

## 2024-01-20 NOTE — Progress Notes (Signed)
 "  New Patient Office Visit  Subjective    Patient ID: Noah Rodriguez, male    DOB: 03-Jun-1975  Age: 49 y.o. MRN: [id number]  CC:  Chief Complaint  Patient presents with   Establish Care    Here to establish care, heart doctor told him he needed to get a PCP and she recommended  [name]    HPI Noah Rodriguez presents for new patient visit to establish care.  Introduced to publishing rights manager role and practice setting.  All questions answered.  Discussed provider/patient relationship and expectations. Came to [facility] due to Noah Rodriguez, his HF Clinic provider, who recommended. Brother was recently diagnosed with Stage 4 colon cancer -- he is on chemo at present -- diagnosed right before Thanksgiving.  PREDIABETIC Since 2023 range from 5.7 to 6.4%. HbA1C:  Lab Results  Component Value Date   HGBA1C 6.2 (H) 01/13/2024  Duration of elevated blood sugar: chronic Polydipsia: no Polyuria: no Weight change: no Visual disturbance: no Glucose Monitoring: no    Accucheck frequency: Not Checking    Fasting glucose:     Post prandial:  Diabetic Education: Not Completed Family history of diabetes: yes dad's mom  HYPERTENSION / HYPERLIPIDEMIA/HF Saw HF Clinic, [place], on 01/13/24. She obtained some labs. Does have OSA, but insurance recently denied CPAP machine. Had sleep study with Dr. Shlomo on 11/22/23. Home study. Takes Entresto , Spironolactone , Jardiance , Carvedilol , and HCTZ.  Satisfied with current treatment? yes Duration of hypertension: chronic BP monitoring frequency: not checking BP range:  BP medication side effects: no Duration of hyperlipidemia: chronic Cholesterol medication side effects: no Cholesterol supplements: none Medication compliance: good compliance Aspirin : no Recent stressors: no Recurrent headaches: no Visual changes: no Palpitations: no Dyspnea: no Chest pain: no Lower extremity edema: occasional, but better than it used to  be Dizzy/lightheaded: no   CHRONIC KIDNEY DISEASE (CKD 3b) He does not want to get to dialysis stage. Does occasionally take Motrin for gout flares. As been working on diet to prevent gout flares. Gets these to left foot. Last flare was in November 2025. CKD status: stable Medications renally dose: yes Previous renal evaluation: no Pneumovax:  needs PCV20, discussed with him Influenza Vaccine:  refuses     Latest Ref Rng & Units 01/13/2024   11:14 AM 11/01/2023   11:45 AM 09/28/2023   12:24 PM  BMP  Glucose 70 - 99 mg/dL 93  89  92   BUN 6 - 24 mg/dL 29  26  28    Creatinine 0.76 - 1.27 mg/dL 7.92  8.22  8.29   BUN/Creat Ratio 9 - 20 14  15  16    Sodium 134 - 144 mmol/L 137  139  140   Potassium 3.5 - 5.2 mmol/L 4.5  4.8  4.2   Chloride 96 - 106 mmol/L 100  101  101   CO2 20 - 29 mmol/L 24  25  22    Calcium 8.7 - 10.2 mg/dL 9.4  9.3  9.6      ASTHMA Had this when little, but occasionally still gets short of breath but does not use inhaler. Has one but does not use. Current inhaler is expired. Saw UC last on 12/01/23 and was treated with Augmentin for flare. Asthma status: stable Satisfied with current treatment?: yes Albuterol /rescue inhaler frequency: rarely Dyspnea frequency: no Wheezing frequency: no Cough frequency: no -- this has improved Nocturnal symptom frequency: no Limitation of activity: no Current upper respiratory symptoms: no Aerochamber/spacer use: no Visits to ER  or Urgent Care in past year: no Pneumovax: needs PCV20, discussed with him Influenza: refused   Outpatient Encounter Medications as of 01/20/2024  Medication Sig   acetaminophen  (TYLENOL ) 650 MG CR tablet Take 650 mg by mouth every 8 (eight) hours as needed for pain.   albuterol  (VENTOLIN  HFA) 108 (90 Base) MCG/ACT inhaler Inhale 2 puffs into the lungs every 6 (six) hours as needed for wheezing or shortness of breath.   carvedilol  (COREG ) 12.5 MG tablet TAKE 2 TABLETS BY MOUTH TWICE DAILY WITH A MEAL    empagliflozin  (JARDIANCE ) 10 MG TABS tablet Take 1 tablet (10 mg total) by mouth daily.   hydrochlorothiazide  (HYDRODIURIL ) 25 MG tablet Take 1 tablet by mouth once daily   Multiple Vitamin (MULTIVITAMIN) tablet Take 1 tablet by mouth daily. [employer] One daily Multi Vitamin   sacubitril -valsartan  (ENTRESTO ) 97-103 MG Take 1 tablet by mouth 2 (two) times daily.   spironolactone  (ALDACTONE ) 25 MG tablet Take 1 tablet by mouth once daily   triamcinolone  cream (KENALOG ) 0.1 % Apply 1 Application topically 2 (two) times daily.   [DISCONTINUED] Dulaglutide  (TRULICITY ) 0.75 MG/0.5ML SOAJ Inject 0.75 mg into the skin once a week. (Patient not taking: Reported on 11/01/2023)   No facility-administered encounter medications on file as of 01/20/2024.    Past Medical History:  Diagnosis Date   Acute kidney injury    Asthma    Chronic combined systolic and diastolic CHF (congestive heart failure) (HCC)    EF < 25% on Cath (09/17/2014)   Gout    Gout of big toe 09/16/2014   H/O medication noncompliance    Hypertension    Hypertensive heart disease    Hypokalemia    MI (myocardial infarction) [facility])    Patient denies having had a MI   NICM (nonischemic cardiomyopathy) (HCC)    a. cardiac cath 09/2014 with normal coronary arteries, EF 20-25% with severe global HK; b. echo 08/2014: EF 30-35%, false tendon in LV aepx of no clinical sig, diffuse HK, GR1DD, aortic root 40 mm, trivial MR, trivial pericardial effusion posterior    Obesity, Class III, BMI 40-49.9 (morbid obesity) (HCC)    Sleep apnea    a. sleep study 05/2010; b. noncompliant with CPAP   Ventral hernia    a. incarcerated omentum 06/10/2015    Past Surgical History:  Procedure Laterality Date   CARDIAC CATHETERIZATION N/A 09/17/2014   Procedure: Right/Left Heart Cath and Coronary Angiography;  Surgeon: Debby DELENA Sor, MD;  Location: [facility] INVASIVE CV LAB;  Service: Cardiovascular;  Laterality: N/A;   VENTRAL HERNIA REPAIR N/A 06/11/2015    Procedure: HERNIA REPAIR VENTRAL ADULT;  Surgeon: Charlie FORBES Fell, MD;  Location: [facility] ORS;  Service: General;  Laterality: N/A;    Family History  Problem Relation Age of Onset   HIV Mother    Hypertension Mother    Colon cancer Brother    Hypertension Maternal Grandmother    Hypertension Paternal Grandmother    Diabetes Paternal Grandmother    Kidney disease Paternal Grandmother    Hypertension Other    Cancer Other    Alcohol abuse Neg Hx    Early death Neg Hx    Heart disease Neg Hx    Hyperlipidemia Neg Hx    Stroke Neg Hx    Heart attack Neg Hx     Social History   Socioeconomic History   Marital status: Single    Spouse name: Not on file   Number of children: Not  on file   Years of education: Not on file   Highest education level: Some college, no degree  Occupational History   Not on file  Tobacco Use   Smoking status: Never   Smokeless tobacco: Never  Vaping Use   Vaping status: Never Used  Substance and Sexual Activity   Alcohol use: Never   Drug use: Never   Sexual activity: Yes    Birth control/protection: Condom  Other Topics Concern   Not on file  Social History Narrative   Not on file   Social Drivers of Health   Tobacco Use: Low Risk (01/20/2024)   Patient History    Smoking Tobacco Use: Never    Smokeless Tobacco Use: Never    Passive Exposure: Not on file  Financial Resource Strain: Medium Risk (01/19/2024)   Overall Financial Resource Strain (CARDIA)    Difficulty of Paying Living Expenses: Somewhat hard  Food Insecurity: No Food Insecurity (01/19/2024)   Epic    Worried About Programme Researcher, Broadcasting/film/video in the Last Year: Never true    Ran Out of Food in the Last Year: Never true  Transportation Needs: No Transportation Needs (01/19/2024)   Epic    Lack of Transportation (Medical): No    Lack of Transportation (Non-Medical): No  Physical Activity: Inactive (01/19/2024)   Exercise Vital Sign    Days of Exercise per Week: 0 days    Minutes of  Exercise per Session: Not on file  Stress: Stress Concern Present (01/19/2024)   [employer] of Occupational Health - Occupational Stress Questionnaire    Feeling of Stress: To some extent  Social Connections: Moderately Integrated (01/19/2024)   Social Connection and Isolation Panel    Frequency of Communication with Friends and Family: More than three times a week    Frequency of Social Gatherings with Friends and Family: Twice a week    Attends Religious Services: More than 4 times per year    Active Member of [employer] or Organizations: No    Attends Banker Meetings: Not on file    Marital Status: Living with partner  Intimate Partner Violence: Not At Risk (01/20/2024)   Epic    Fear of Current or Ex-Partner: No    Emotionally Abused: No    Physically Abused: No    Sexually Abused: No  Depression (PHQ2-9): Low Risk (01/20/2024)   Depression (PHQ2-9)    PHQ-2 Score: 1  Alcohol Screen: Low Risk (01/20/2024)   Alcohol Screen    Last Alcohol Screening Score (AUDIT): 0  Housing: Low Risk (01/20/2024)   Epic    Unable to Pay for Housing in the Last Year: No    Number of Times Moved in the Last Year: 0    Homeless in the Last Year: No  Recent Concern: Housing - High Risk (01/19/2024)   Epic    Unable to Pay for Housing in the Last Year: Yes    Number of Times Moved in the Last Year: 0    Homeless in the Last Year: No  Utilities: Not At Risk (01/20/2024)   Epic    Threatened with loss of utilities: No  Health Literacy: Adequate Health Literacy (01/20/2024)   B1300 Health Literacy    Frequency of need for help with medical instructions: Never    Review of Systems  Constitutional:  Negative for chills, diaphoresis, fever and weight loss.  Respiratory:  Negative for cough, shortness of breath and wheezing.   Cardiovascular:  Negative for chest pain,  palpitations, orthopnea and leg swelling.  Neurological: Negative.   Endo/Heme/Allergies: Negative.   Psychiatric/Behavioral:  Negative.          Objective    BP 124/89 (BP Location: Left Arm, Patient Position: Sitting, Cuff Size: Large)   Pulse 64   Temp 97.6 F (36.4 C) (Oral)   Resp 18   Ht 5' 7.01 (1.702 m)   Wt (!) 331 lb 6.4 oz (150.3 kg)   BMI 51.89 kg/m   Physical Exam Vitals and nursing note reviewed.  Constitutional:      General: He is awake. He is not in acute distress.    Appearance: He is well-developed and well-groomed. He is morbidly obese. He is not ill-appearing or toxic-appearing.  HENT:     Head: Normocephalic.     Right Ear: Hearing and external ear normal.     Left Ear: Hearing and external ear normal.  Eyes:     General: Lids are normal.     Extraocular Movements: Extraocular movements intact.     Conjunctiva/sclera: Conjunctivae normal.  Neck:     Thyroid : No thyromegaly.     Vascular: No carotid bruit.  Cardiovascular:     Rate and Rhythm: Normal rate and regular rhythm.     Heart sounds: Normal heart sounds. No murmur heard.    No gallop.  Pulmonary:     Effort: Pulmonary effort is normal. No accessory muscle usage or respiratory distress.     Breath sounds: Normal breath sounds. No decreased breath sounds, wheezing or rales.  Abdominal:     General: Bowel sounds are normal. There is no distension.     Palpations: Abdomen is soft.     Tenderness: There is no abdominal tenderness.  Musculoskeletal:     Cervical back: Full passive range of motion without pain.     Right lower leg: No edema.     Left lower leg: No edema.  Lymphadenopathy:     Cervical: No cervical adenopathy.  Skin:    General: Skin is warm.     Capillary Refill: Capillary refill takes less than 2 seconds.  Neurological:     Mental Status: He is alert and oriented to person, place, and time.     Deep Tendon Reflexes: Reflexes are normal and symmetric.     Reflex Scores:      Brachioradialis reflexes are 2+ on the right side and 2+ on the left side.      Patellar reflexes are 2+ on the right  side and 2+ on the left side. Psychiatric:        Attention and Perception: Attention normal.        Mood and Affect: Mood normal.        Speech: Speech normal.        Behavior: Behavior normal. Behavior is cooperative.        Thought Content: Thought content normal.       Latest Ref Rng & Units 01/21/2021    9:41 AM 08/25/2016    3:47 AM 08/24/2016    8:20 AM  CBC  WBC 3.8 - 10.8 Thousand/uL 5.5  7.8  7.8   Hemoglobin 13.2 - 17.1 g/dL 86.5  85.8  85.3   Hematocrit 38.5 - 50.0 % 40.7  42.7  45.0   Platelets 140 - 400 Thousand/uL 343  290  320    CMP     Component Value Date/Time   NA 137 01/13/2024 1114   K 4.5 01/13/2024 1114   CL  100 01/13/2024 1114   CO2 24 01/13/2024 1114   GLUCOSE 93 01/13/2024 1114   GLUCOSE 98 07/07/2023 1241   BUN 29 (H) 01/13/2024 1114   CREATININE 2.07 (H) 01/13/2024 1114   CALCIUM 9.4 01/13/2024 1114   PROT 7.3 06/10/2015 0600   ALBUMIN 4.1 06/10/2015 0600   AST 30 06/10/2015 0600   ALT 46 06/10/2015 0600   ALKPHOS 67 06/10/2015 0600   BILITOT 0.5 06/10/2015 0600   GFR 65.77 10/07/2014 1142   EGFR 39 (L) 01/13/2024 1114   GFRNONAA 53 (L) 07/07/2023 1241       Assessment & Plan:   Problem List Items Addressed This Visit       Cardiovascular and Mediastinum   Nonischemic cardiomyopathy (HCC)   Chronic, ongoing. Continue to collaborate with HF Clinic, recent note reviewed. Ensure to continue all curren medications.      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 Recommend he monitor BP at least a few mornings a week at home and document.  DASH diet at home.  Continue current medication regimen and adjust as needed.  Labs today: CBC, CMP, TSH, urine ALB. Urine ALB 10 February 2024. Would benefit nephrology assessment and recommendations. Discussed with patient and he would like to visit them, referral placed.        Chronic diastolic heart  failure (HCC) - Primary   Chronic, ongoing. Euvolemic. Will continue to collaborate with HF Clinic, recent note reviewed. Continue all current medications. Could consider trial of increasing Jardiance  to 25 MG in future -- has prediabetes and CKD which this offers benefit to. Recommend: - Reminded to call for an overnight weight gain of >2 pounds or a weekly weight gain of >5 pounds - not adding salt to food and read food labels. Reviewed the importance of keeping daily sodium intake to 2000mg  daily.  - Avoid Ibuprofen products.        Respiratory   Sleep apnea   Chronic, recent study on 11/22/23 noted moderate OSA, but he reports insurance denied equipment. Will reach out to Dr. Shlomo and Noah on this. Could consider attempting to get Zepbound in future which could benefit OSA and weight.      Mild persistent asthma   Chronic, stable. Albuterol  as needed only, new inhaler sent in today. Could consider spirometry in future if available. Start inhalers as needed.      Relevant Medications   albuterol  (VENTOLIN  HFA) 108 (90 Base) MCG/ACT inhaler     Musculoskeletal and Integument   Idiopathic chronic gout of left foot without tophus   Chronic, ongoing. He has been using Ibuprofen for this and recommend he not due to HF and CKD. Will check uric acid level today and discussed with him we could consider renal dose  Allopurinol  to help prevent flares, which would reduce risk of him taking Ibuprofen.      Relevant Orders   Uric acid     Genitourinary   CKD stage 3b, GFR 30-44 ml/min (HCC)   Chronic with some decline on recent two labs. Would benefit nephrology assessment and recommendations. Discussed with patient and he would like to visit them, referral placed. Continue Jardiance  which offers benefit to kidneys. Labs today: CBC, CMP, urine ALB, PTH. Urine ALB 10 February 2024. - Recommend good water intake daily, although not too much due to HF, plus no Ibuprofen use.      Relevant  Orders  TSH   Comprehensive metabolic panel with GFR   Microalbumin, Urine Waived (Completed)   Parathyroid  hormone, intact (no Ca)   CBC with Differential/Platelet   Ambulatory referral to Nephrology     Other   Prediabetes   Ongoing with recent A1c 6.2%. Continue Jardiance , could consider increase to 25 MG in future or attempting to get Zepbound due to OSA. Plan to recheck A1c next visit.      Obesity, Class III, BMI 40-49.9 (morbid obesity) (HCC)   BMI 51.89 with HF, OSA, HTN. Recommended eating smaller high protein, low fat meals more frequently and exercising 30 mins a day 5 times a week with a goal of 10-15lb weight loss in the next 3 months. Patient voiced their understanding and motivation to adhere to these recommendations. May benefit attempting to get Zepbound next visit due to his moderate OSA -- will discuss with him next visit.      Other Visit Diagnoses       Need for hepatitis C screening test       Hep C screening today, educated patient on this.   Relevant Orders   Hepatitis C antibody     Colon cancer screening       GI referral placed.   Relevant Orders   Ambulatory referral to Gastroenterology       Return in about 3 months (around 04/19/2024) for HTN/HLD, IFG, HF, OSA.   Adreonna Yontz T Daegan Arizmendi, Rodriguez   "

## 2024-01-20 NOTE — Assessment & Plan Note (Signed)
 Chronic, ongoing. Euvolemic. Will continue to collaborate with HF Clinic, recent note reviewed. Continue all current medications. Could consider trial of increasing Jardiance  to 25 MG in future -- has prediabetes and CKD which this offers benefit to. Recommend: - Reminded to call for an overnight weight gain of >2 pounds or a weekly weight gain of >5 pounds - not adding salt to food and read food labels. Reviewed the importance of keeping daily sodium intake to 2000mg  daily.  - Avoid Ibuprofen products.

## 2024-01-21 ENCOUNTER — Ambulatory Visit: Payer: Self-pay | Admitting: Nurse Practitioner

## 2024-01-21 MED ORDER — ALLOPURINOL 100 MG PO TABS: 50.0000 mg | ORAL_TABLET | Freq: Every day | ORAL | 3 refills | Status: AC

## 2024-01-21 NOTE — Progress Notes (Signed)
 Contacted via MyChart -- Current [facility] 126   Good morning Carrick, it was lovely to meet you. Your labs have returned: - Uric acid level is elevated which does put you at risk for ongoing gout flares. I am sending in Allopurinol  to take daily, we will renal dose at 50 MG daily and see if this helps lower uric acid and reduce your flares, which could in turn lower your Ibuprofen use over time. - Kidney function looks better on this check than on previous. Ensure good water intake and minimal use of Ibuprofen. The kidney doctor will call you to schedule. - Remainder of labs overall stable, waiting on one more to return and if abnormal I will let you know. Any questions? Keep being amazing!!  Thank you for allowing me to participate in your care.  I appreciate you. Kindest regards, Ellagrace Yoshida

## 2024-01-22 LAB — CBC WITH DIFFERENTIAL/PLATELET
Basophils Absolute: 0 x10E3/uL (ref 0.0–0.2)
Basos: 1 %
EOS (ABSOLUTE): 0.2 x10E3/uL (ref 0.0–0.4)
Eos: 4 %
Hematocrit: 44.6 % (ref 37.5–51.0)
Hemoglobin: 14.1 g/dL (ref 13.0–17.7)
Immature Grans (Abs): 0 x10E3/uL (ref 0.0–0.1)
Immature Granulocytes: 0 %
Lymphocytes Absolute: 2 x10E3/uL (ref 0.7–3.1)
Lymphs: 39 %
MCH: 27.5 pg (ref 26.6–33.0)
MCHC: 31.6 g/dL (ref 31.5–35.7)
MCV: 87 fL (ref 79–97)
Monocytes Absolute: 0.4 x10E3/uL (ref 0.1–0.9)
Monocytes: 8 %
Neutrophils Absolute: 2.4 x10E3/uL (ref 1.4–7.0)
Neutrophils: 48 %
Platelets: 324 x10E3/uL (ref 150–450)
RBC: 5.13 x10E6/uL (ref 4.14–5.80)
RDW: 13.3 % (ref 11.6–15.4)
WBC: 5 x10E3/uL (ref 3.4–10.8)

## 2024-01-22 LAB — URIC ACID: Uric Acid: 8.6 mg/dL — ABNORMAL HIGH (ref 3.8–8.4)

## 2024-01-22 LAB — COMPREHENSIVE METABOLIC PANEL WITH GFR
ALT: 29 IU/L (ref 0–44)
AST: 21 IU/L (ref 0–40)
Albumin: 4.3 g/dL (ref 4.1–5.1)
Alkaline Phosphatase: 81 IU/L (ref 47–123)
BUN/Creatinine Ratio: 15 (ref 9–20)
BUN: 23 mg/dL (ref 6–24)
Bilirubin Total: 0.4 mg/dL (ref 0.0–1.2)
CO2: 23 mmol/L (ref 20–29)
Calcium: 9.6 mg/dL (ref 8.7–10.2)
Chloride: 101 mmol/L (ref 96–106)
Creatinine, Ser: 1.52 mg/dL — ABNORMAL HIGH (ref 0.76–1.27)
Globulin, Total: 3 g/dL (ref 1.5–4.5)
Glucose: 93 mg/dL (ref 70–99)
Potassium: 4.4 mmol/L (ref 3.5–5.2)
Sodium: 139 mmol/L (ref 134–144)
Total Protein: 7.3 g/dL (ref 6.0–8.5)
eGFR: 56 mL/min/1.73 — ABNORMAL LOW

## 2024-01-22 LAB — TSH: TSH: 1.83 u[IU]/mL (ref 0.450–4.500)

## 2024-01-22 LAB — HEPATITIS C ANTIBODY: Hep C Virus Ab: NONREACTIVE

## 2024-01-22 LAB — PARATHYROID HORMONE, INTACT (NO CA): PTH: 77 pg/mL — AB (ref 15–65)

## 2024-01-24 ENCOUNTER — Telehealth: Payer: Self-pay

## 2024-01-24 DIAGNOSIS — Z1211 Encounter for screening for malignant neoplasm of colon: Secondary | ICD-10-CM

## 2024-01-24 DIAGNOSIS — Z800 Family history of malignant neoplasm of digestive organs: Secondary | ICD-10-CM

## 2024-01-24 NOTE — Telephone Encounter (Signed)
 Gastroenterology Pre-Procedure Review  Request Date: TBD Requesting Physician: Dr. NELLIE  PATIENT REVIEW QUESTIONS: The patient responded to the following health history questions as indicated:    1. Are you having any GI issues? no 2. Do you have a personal history of Polyps? no 3. Do you have a family history of Colon Cancer or Polyps? yes (brother stage 4 colon cancer diagnosed last year) 4. Diabetes Mellitus? no 5. Joint replacements in the past 12 months?no 6. Major health problems in the past 3 months?no 7. Any artificial heart valves, MVP, or defibrillator?no 8. Cardiac history?  CHFYes takes jardiance  has been advised that he will need to stop 3 days prior to procedure    MEDICATIONS & ALLERGIES:    Patient reports the following regarding taking any anticoagulation/antiplatelet therapy:   Plavix, Coumadin, Eliquis, Xarelto, Lovenox , Pradaxa, Brilinta, or Effient? no Aspirin ? no  Patient confirms/reports the following medications:  Current Outpatient Medications  Medication Sig Dispense Refill   acetaminophen  (TYLENOL ) 650 MG CR tablet Take 650 mg by mouth every 8 (eight) hours as needed for pain.     albuterol  (VENTOLIN  HFA) 108 (90 Base) MCG/ACT inhaler Inhale 2 puffs into the lungs every 6 (six) hours as needed for wheezing or shortness of breath. 18 g 3   allopurinol  (ZYLOPRIM ) 100 MG tablet Take 0.5 tablets (50 mg total) by mouth daily. 45 tablet 3   carvedilol  (COREG ) 12.5 MG tablet TAKE 2 TABLETS BY MOUTH TWICE DAILY WITH A MEAL 90 tablet 0   empagliflozin  (JARDIANCE ) 10 MG TABS tablet Take 1 tablet (10 mg total) by mouth daily. 90 tablet 3   hydrochlorothiazide  (HYDRODIURIL ) 25 MG tablet Take 1 tablet by mouth once daily 30 tablet 5   Multiple Vitamin (MULTIVITAMIN) tablet Take 1 tablet by mouth daily. [employer] One daily Multi Vitamin     sacubitril -valsartan  (ENTRESTO ) 97-103 MG Take 1 tablet by mouth 2 (two) times daily. 180 tablet 3   spironolactone   (ALDACTONE ) 25 MG tablet Take 1 tablet by mouth once daily 30 tablet 5   triamcinolone  cream (KENALOG ) 0.1 % Apply 1 Application topically 2 (two) times daily. 30 g 3   No current facility-administered medications for this visit.    Patient confirms/reports the following allergies:  Allergies[1]  No orders of the defined types were placed in this encounter.   AUTHORIZATION INFORMATION Primary Insurance: 1D#: Group #:  Secondary Insurance: 1D#: Group #:  SCHEDULE INFORMATION: Date:  Time: Location:     [1] No Known Allergies

## 2024-01-25 NOTE — Telephone Encounter (Signed)
 Patient to call me back to schedule with Dr. Theophilus tomorrow morning at 9am .  Thanks,  Vernon CMA

## 2024-01-26 ENCOUNTER — Other Ambulatory Visit: Payer: Self-pay

## 2024-01-26 DIAGNOSIS — Z800 Family history of malignant neoplasm of digestive organs: Secondary | ICD-10-CM

## 2024-01-26 DIAGNOSIS — Z1211 Encounter for screening for malignant neoplasm of colon: Secondary | ICD-10-CM

## 2024-01-26 MED ORDER — NA SULFATE-K SULFATE-MG SULF 17.5-3.13-1.6 GM/177ML PO SOLN: 1.0000 | Freq: Once | ORAL | 0 refills | Status: AC

## 2024-01-26 NOTE — Telephone Encounter (Signed)
**Note De-Identified Anitria Andon Obfuscation** Letter received from [employer] stating that they have approved this CPAP Titration from 01/18/2024-07/22/2024. Case #: [id number].  I have transferred the order to the Sleep Lab.  I called the pt but got no answer so I left a message on his VM (Ok per [facility]) advising him of this CPAP Titration approval and I left the [facility] phone number so he can call them to be scheduled.  I also left my name and the office phone number in the message so he can call me back if he has any questions or concerns.

## 2024-01-26 NOTE — Telephone Encounter (Signed)
 Colonoscopy has been scheduled with Dr. Theophilus for 03/09/24 at [facility].  Thanks,  [place], CMA

## 2024-01-26 NOTE — Addendum Note (Signed)
 Addended by: CURTISS ROSALINE RAMAN on: 01/26/2024 10:56 AM   Modules accepted: Orders

## 2024-01-30 ENCOUNTER — Other Ambulatory Visit (HOSPITAL_COMMUNITY): Payer: Self-pay

## 2024-01-30 NOTE — Telephone Encounter (Signed)
 Advanced Heart Failure Patient Advocate Encounter  Prior authorization for Trulicity  (Key [id number]) is unable to match patient eligibility. Attempted to contact insurance to initiate prior auth by phone, office is currently closed for holiday. Will try to submit later in the week.  Rachel DEL, CPhT Rx Patient Advocate Phone: [phone]

## 2024-02-08 ENCOUNTER — Other Ambulatory Visit (HOSPITAL_COMMUNITY): Payer: Self-pay

## 2024-02-08 NOTE — Telephone Encounter (Signed)
 Contacted [employer] directly to start prior authorization request. Noah Rodriguez is faxing a prior auth form with criteria.

## 2024-02-10 ENCOUNTER — Telehealth: Payer: Self-pay | Admitting: *Deleted

## 2024-02-10 NOTE — Telephone Encounter (Signed)
-----   Message from Wilbert Bihari, MD sent at 01/22/2024  6:10 PM EST ----- NIna please call the DME to find out what is going on ----- Message ----- From: Valerio Melanie DASEN, NP Sent: 01/20/2024   7:44 PM EST To: Wilbert JONELLE Bihari, MD; Ellouise DELENA Class, FNP  Good day, wanted to alert you both that patient told me today that insurance denied his CPAP machine -- not sure if there is something to do to help with this. I know he had study on 11/22/23 and had moderate OSA, not sure why they denied. He reported they told him over phone and are sending letter.

## 2024-02-10 NOTE — Telephone Encounter (Signed)
 Letter received from [employer] stating that they have approved this CPAP Titration from 01/18/2024-07/22/2024.  Patient is scheduled for his cpap titration on 04/17/24. Patient is aware of his approval and his appointment.

## 2024-03-09 ENCOUNTER — Ambulatory Visit: Admit: 2024-03-09

## 2024-03-09 SURGERY — COLONOSCOPY
Anesthesia: General

## 2024-04-16 ENCOUNTER — Ambulatory Visit: Admitting: Family

## 2024-04-17 ENCOUNTER — Ambulatory Visit (HOSPITAL_BASED_OUTPATIENT_CLINIC_OR_DEPARTMENT_OTHER): Admitting: Cardiology

## 2024-04-23 ENCOUNTER — Ambulatory Visit: Admitting: Nurse Practitioner

## 2024-04-24 ENCOUNTER — Ambulatory Visit: Admitting: Internal Medicine
